# Patient Record
Sex: Female | Born: 1955 | Race: Black or African American | Hispanic: No | Smoking: Current some day smoker
Health system: Southern US, Community
[De-identification: ages and names within clinical notes are randomized; demographics above are authoritative.]

## PROBLEM LIST (undated history)

## (undated) DIAGNOSIS — E876 Hypokalemia: Secondary | ICD-10-CM

## (undated) DIAGNOSIS — D693 Immune thrombocytopenic purpura: Secondary | ICD-10-CM

## (undated) DIAGNOSIS — C801 Malignant (primary) neoplasm, unspecified: Secondary | ICD-10-CM

## (undated) DIAGNOSIS — F419 Anxiety disorder, unspecified: Secondary | ICD-10-CM

## (undated) DIAGNOSIS — J320 Chronic maxillary sinusitis: Secondary | ICD-10-CM

## (undated) DIAGNOSIS — I10 Essential (primary) hypertension: Secondary | ICD-10-CM

## (undated) DIAGNOSIS — M4722 Other spondylosis with radiculopathy, cervical region: Secondary | ICD-10-CM

## (undated) HISTORY — PX: ABDOMINAL HYSTERECTOMY: SHX81

## (undated) HISTORY — PX: BREAST BIOPSY: SHX20

## (undated) HISTORY — PX: OTHER SURGICAL HISTORY: SHX169

---

## 2004-12-19 ENCOUNTER — Other Ambulatory Visit: Payer: Self-pay

## 2004-12-19 ENCOUNTER — Emergency Department: Payer: Self-pay | Admitting: Internal Medicine

## 2005-01-23 ENCOUNTER — Ambulatory Visit: Payer: Self-pay | Admitting: Unknown Physician Specialty

## 2005-01-26 ENCOUNTER — Ambulatory Visit: Payer: Self-pay | Admitting: Internal Medicine

## 2005-02-17 ENCOUNTER — Emergency Department: Payer: Self-pay | Admitting: Emergency Medicine

## 2005-04-14 ENCOUNTER — Ambulatory Visit: Payer: Self-pay | Admitting: Internal Medicine

## 2006-03-09 ENCOUNTER — Emergency Department: Payer: Self-pay | Admitting: Emergency Medicine

## 2006-03-13 ENCOUNTER — Emergency Department: Payer: Self-pay | Admitting: Emergency Medicine

## 2006-04-27 ENCOUNTER — Emergency Department: Payer: Self-pay

## 2006-07-18 ENCOUNTER — Ambulatory Visit: Payer: Self-pay | Admitting: Internal Medicine

## 2007-11-04 ENCOUNTER — Ambulatory Visit: Payer: Self-pay | Admitting: Psychiatry

## 2007-11-04 ENCOUNTER — Inpatient Hospital Stay (HOSPITAL_COMMUNITY): Admission: RE | Admit: 2007-11-04 | Discharge: 2007-11-06 | Payer: Self-pay | Admitting: Psychiatry

## 2007-11-19 ENCOUNTER — Ambulatory Visit: Payer: Self-pay | Admitting: Unknown Physician Specialty

## 2007-11-23 ENCOUNTER — Ambulatory Visit: Payer: Self-pay | Admitting: Unknown Physician Specialty

## 2007-12-24 ENCOUNTER — Ambulatory Visit: Payer: Self-pay | Admitting: Unknown Physician Specialty

## 2009-10-25 ENCOUNTER — Inpatient Hospital Stay: Payer: Self-pay | Admitting: Unknown Physician Specialty

## 2010-02-28 ENCOUNTER — Emergency Department: Payer: Self-pay | Admitting: Emergency Medicine

## 2011-01-19 LAB — TSH: TSH: 0.658

## 2011-01-19 LAB — CBC
MCHC: 33.4
RDW: 15.9 — ABNORMAL HIGH

## 2011-01-19 LAB — COMPREHENSIVE METABOLIC PANEL
ALT: 21
AST: 19
Albumin: 3.4 — ABNORMAL LOW
Calcium: 9.1
GFR calc Af Amer: >60
Sodium: 136
Total Protein: 6.7

## 2011-03-11 ENCOUNTER — Emergency Department: Payer: Self-pay | Admitting: Emergency Medicine

## 2012-12-02 ENCOUNTER — Emergency Department: Payer: Self-pay | Admitting: Emergency Medicine

## 2012-12-04 ENCOUNTER — Emergency Department: Payer: Self-pay | Admitting: Emergency Medicine

## 2013-01-05 ENCOUNTER — Emergency Department: Payer: Self-pay | Admitting: Emergency Medicine

## 2013-07-14 ENCOUNTER — Emergency Department: Payer: Self-pay | Admitting: Emergency Medicine

## 2013-10-22 ENCOUNTER — Emergency Department: Payer: Self-pay | Admitting: Emergency Medicine

## 2013-10-30 ENCOUNTER — Emergency Department: Payer: Self-pay | Admitting: Emergency Medicine

## 2014-05-19 ENCOUNTER — Ambulatory Visit: Payer: Self-pay | Admitting: Family Medicine

## 2014-05-19 DIAGNOSIS — M25532 Pain in left wrist: Secondary | ICD-10-CM | POA: Diagnosis not present

## 2014-05-19 DIAGNOSIS — F418 Other specified anxiety disorders: Secondary | ICD-10-CM | POA: Diagnosis not present

## 2014-05-19 DIAGNOSIS — M1990 Unspecified osteoarthritis, unspecified site: Secondary | ICD-10-CM | POA: Diagnosis not present

## 2014-05-19 DIAGNOSIS — M79642 Pain in left hand: Secondary | ICD-10-CM | POA: Diagnosis not present

## 2014-05-19 DIAGNOSIS — Z8639 Personal history of other endocrine, nutritional and metabolic disease: Secondary | ICD-10-CM | POA: Diagnosis not present

## 2014-05-19 DIAGNOSIS — M19032 Primary osteoarthritis, left wrist: Secondary | ICD-10-CM | POA: Diagnosis not present

## 2014-05-19 DIAGNOSIS — M797 Fibromyalgia: Secondary | ICD-10-CM | POA: Diagnosis not present

## 2014-05-19 DIAGNOSIS — I10 Essential (primary) hypertension: Secondary | ICD-10-CM | POA: Diagnosis not present

## 2014-06-02 ENCOUNTER — Emergency Department: Payer: Self-pay | Admitting: Emergency Medicine

## 2014-06-02 DIAGNOSIS — R197 Diarrhea, unspecified: Secondary | ICD-10-CM | POA: Diagnosis not present

## 2014-06-02 DIAGNOSIS — F419 Anxiety disorder, unspecified: Secondary | ICD-10-CM | POA: Diagnosis not present

## 2014-06-02 DIAGNOSIS — R109 Unspecified abdominal pain: Secondary | ICD-10-CM | POA: Diagnosis not present

## 2014-06-02 DIAGNOSIS — J069 Acute upper respiratory infection, unspecified: Secondary | ICD-10-CM | POA: Diagnosis not present

## 2014-06-02 DIAGNOSIS — R51 Headache: Secondary | ICD-10-CM | POA: Diagnosis not present

## 2014-06-02 DIAGNOSIS — I10 Essential (primary) hypertension: Secondary | ICD-10-CM | POA: Diagnosis not present

## 2014-06-02 DIAGNOSIS — M79645 Pain in left finger(s): Secondary | ICD-10-CM | POA: Diagnosis not present

## 2014-06-02 DIAGNOSIS — R112 Nausea with vomiting, unspecified: Secondary | ICD-10-CM | POA: Diagnosis not present

## 2014-06-02 DIAGNOSIS — Z720 Tobacco use: Secondary | ICD-10-CM | POA: Diagnosis not present

## 2014-07-24 ENCOUNTER — Emergency Department
Admit: 2014-07-24 | Disposition: A | Discharge status: Home / Self Care | Payer: Self-pay | Admitting: Emergency Medicine

## 2014-07-24 DIAGNOSIS — S63502A Unspecified sprain of left wrist, initial encounter: Secondary | ICD-10-CM | POA: Diagnosis not present

## 2014-07-24 DIAGNOSIS — M25532 Pain in left wrist: Secondary | ICD-10-CM | POA: Diagnosis not present

## 2014-07-24 DIAGNOSIS — Z791 Long term (current) use of non-steroidal anti-inflammatories (NSAID): Secondary | ICD-10-CM | POA: Diagnosis not present

## 2014-07-24 DIAGNOSIS — S6992XD Unspecified injury of left wrist, hand and finger(s), subsequent encounter: Secondary | ICD-10-CM | POA: Diagnosis not present

## 2014-07-24 DIAGNOSIS — I10 Essential (primary) hypertension: Secondary | ICD-10-CM | POA: Diagnosis not present

## 2014-07-24 DIAGNOSIS — M1812 Unilateral primary osteoarthritis of first carpometacarpal joint, left hand: Secondary | ICD-10-CM | POA: Diagnosis not present

## 2014-07-24 DIAGNOSIS — Z880 Allergy status to penicillin: Secondary | ICD-10-CM | POA: Diagnosis not present

## 2014-09-14 DIAGNOSIS — J0100 Acute maxillary sinusitis, unspecified: Secondary | ICD-10-CM | POA: Diagnosis not present

## 2014-09-14 DIAGNOSIS — S46811A Strain of other muscles, fascia and tendons at shoulder and upper arm level, right arm, initial encounter: Secondary | ICD-10-CM | POA: Diagnosis not present

## 2014-09-18 ENCOUNTER — Emergency Department
Admission: EM | Admit: 2014-09-18 | Discharge: 2014-09-18 | Disposition: A | Discharge status: Home / Self Care | Payer: Self-pay | Attending: Student | Admitting: Student

## 2014-09-18 ENCOUNTER — Emergency Department: Payer: Self-pay

## 2014-09-18 DIAGNOSIS — Z7952 Long term (current) use of systemic steroids: Secondary | ICD-10-CM | POA: Insufficient documentation

## 2014-09-18 DIAGNOSIS — Z720 Tobacco use: Secondary | ICD-10-CM | POA: Insufficient documentation

## 2014-09-18 DIAGNOSIS — M25511 Pain in right shoulder: Secondary | ICD-10-CM | POA: Insufficient documentation

## 2014-09-18 DIAGNOSIS — M542 Cervicalgia: Secondary | ICD-10-CM | POA: Insufficient documentation

## 2014-09-18 DIAGNOSIS — I10 Essential (primary) hypertension: Secondary | ICD-10-CM | POA: Insufficient documentation

## 2014-09-18 DIAGNOSIS — Z79899 Other long term (current) drug therapy: Secondary | ICD-10-CM | POA: Insufficient documentation

## 2014-09-18 HISTORY — DX: Essential (primary) hypertension: I10

## 2014-09-18 MED ORDER — KETOROLAC TROMETHAMINE 60 MG/2ML IM SOLN
60.0000 mg | Freq: Once | INTRAMUSCULAR | Status: AC
  Administered 2014-09-18: 60 mg via INTRAMUSCULAR

## 2014-09-18 MED ORDER — KETOROLAC TROMETHAMINE 60 MG/2ML IM SOLN
INTRAMUSCULAR | Status: AC
  Filled 2014-09-18: qty 2

## 2014-09-18 MED ORDER — DIAZEPAM 5 MG PO TABS
5.0000 mg | ORAL_TABLET | Freq: Once | ORAL | Status: AC
  Administered 2014-09-18: 5 mg via ORAL

## 2014-09-18 MED ORDER — DIAZEPAM 5 MG PO TABS
ORAL_TABLET | ORAL | Status: AC
  Administered 2014-09-18: 5 mg via ORAL
  Filled 2014-09-18: qty 1

## 2014-09-18 MED ORDER — HYDROCODONE-ACETAMINOPHEN 5-325 MG PO TABS: 1.0000 | ORAL_TABLET | ORAL | Status: DC | PRN

## 2014-09-18 MED ORDER — DIAZEPAM 2 MG PO TABS: 2.0000 mg | ORAL_TABLET | Freq: Three times a day (TID) | ORAL | Status: DC | PRN

## 2014-09-18 MED ORDER — PREDNISONE 10 MG PO TABS: ORAL_TABLET | ORAL | Status: DC

## 2014-09-18 NOTE — ED Provider Notes (Signed)
[facility] Emergency Department Provider Note  ____________________________________________  Time seen:12:20  I have reviewed the triage vital signs and the nursing notes.   HISTORY  Chief Complaint Shoulder Pain    HPI Erica Bartlett is a 59 y.o. female complains of right shoulder pain 1 week. She denies any recent injury to her shoulder. She infrequently has taken Aleve and some Tylenol without relief. She describes her pain as a throbbing sensation in her shoulder radiating down her right upper arm. She has had this same pain in the past but has never seen her primary care doctor for this. She denies any chest pain, shortness of breath, or diaphoresis. There is been no indigestion associated with this. Range of motion increases her pain we're holding R arm decreases the pain. Currently her pain is 10 out of 10.   Past Medical History  Diagnosis Date  . Hypertension     There are no active problems to display for this patient.   Past Surgical History  Procedure Laterality Date  . Cesarean section    . Cyst removed      breast, benign  . Abdominal hysterectomy      Current Outpatient Rx  Name  Route  Sig  Dispense  Refill  . amLODipine (NORVASC) 10 MG tablet   Oral   Take 10 mg by mouth daily.         . hydrochlorothiazide (HYDRODIURIL) 25 MG tablet   Oral   Take 25 mg by mouth daily.         . diazepam (VALIUM) 2 MG tablet   Oral   Take 1 tablet (2 mg total) by mouth every 8 (eight) hours as needed for muscle spasms.   9 tablet   0   . HYDROcodone-acetaminophen (NORCO/VICODIN) 5-325 MG per tablet   Oral   Take 1 tablet by mouth every 4 (four) hours as needed for moderate pain.   20 tablet   0   . predniSONE (DELTASONE) 10 MG tablet      Take 6 tablets  today, on day 2 take 5 tablets, day 3 take 4 tablets, day 4 take 3 tablets, day 5 take  2 tablets and 1 tablet the last day   21 tablet   0     Allergies Review of  patient's allergies indicates no known allergies.  No family history on file.  Social History History  Substance Use Topics  . Smoking status: Current Some Day Smoker  . Smokeless tobacco: Never Used  . Alcohol Use: No    Review of Systems Constitutional: No fever/chills Eyes: No visual changes. ENT: No sore throat. Cardiovascular: Denies chest pain. Respiratory: Denies shortness of breath. Gastrointestinal: No abdominal pain.  No nausea, no vomiting.  Genitourinary: Negative for dysuria. Musculoskeletal: Negative for back pain. Skin: Negative for rash. Neurological: Negative for headaches, focal weakness or numbness.  10-point ROS otherwise negative.  ____________________________________________   PHYSICAL EXAM:  VITAL SIGNS: ED Triage Vitals  Enc Vitals Group     BP 09/18/14 1143 156/104 mmHg     Pulse Rate 09/18/14 1143 68     Resp 09/18/14 1143 18     Temp 09/18/14 1143 97.8 F (36.6 C)     Temp Source 09/18/14 1143 Oral     SpO2 09/18/14 1143 100 %     Weight 09/18/14 1143 169 lb (76.658 kg)     Height 09/18/14 1143 5\' 3"  (1.6 m)     Head Cir --  Peak Flow --      Pain Score 09/18/14 1144 10     Pain Loc --      Pain Edu? --      Excl. in [place]? --     Constitutional: Alert and oriented. Well appearing and in no acute distress. Eyes: Conjunctivae are normal. PERRL. EOMI. Head: Atraumatic. Nose: No congestion/rhinnorhea. Mouth/Throat: Mucous membranes are moist.  Oropharynx non-erythematous. Neck: No stridor.  Neck is supple. There is tenderness on palpation posterior cervical spine to extremely light touch. Tender right trapezius muscle tenderness and upper rhomboid muscle. Hematological/Lymphatic/Immunilogical: No cervical lymphadenopathy. Cardiovascular: Normal rate, regular rhythm. Grossly normal heart sounds.  Good peripheral circulation. Respiratory: Normal respiratory effort.  No retractions. Lungs CTAB. Musculoskeletal: No lower extremity  tenderness nor edema.  No joint effusions. Cervical spine and right shoulder no gross deformity noted. Range of motion is restricted secondary to pain. Patient pulls away during exam with palpation of extremely light touch. There is no crepitus on range of motion of the shoulder. Neurologic:  Normal speech and language. No gross focal neurologic deficits are appreciated. Speech is normal. No gait instability. Grip strength is equal bilaterally Skin:  Skin is warm, dry and intact. No rash noted. Psychiatric: Mood and affect are normal. Speech and behavior are normal.  ____________________________________________   LABS (all labs ordered are listed, but only abnormal results are displayed)  Labs Reviewed - No data to display RADIOLOGY  Cervical spine x-rays per radiologist as no fracture or acute changes. Stable multifocal osteoarthritic. Suspicious for muscle spasms. X-rays right shoulder shows mild degenerative changes with mild spurring of the greater tuberosity ____________________________________________   PROCEDURES  Procedure(s) performed: None  Critical Care performed: No  ____________________________________________   INITIAL IMPRESSION / ASSESSMENT AND PLAN / ED COURSE  Pertinent labs & imaging results that were available during my care of the patient were reviewed by me and considered in my medical decision making (see chart for details).  Patient was placed in a sling for several days for support. She was started on a stairway dosepak along with something for pain. She is to follow-up with Dr. Sabra Heck if any continued problems. ____________________________________________   FINAL CLINICAL IMPRESSION(S) / ED DIAGNOSES  Final diagnoses:  Acute shoulder pain, right  Cervical pain      Johnn Hai, PA-C 09/18/14 1516  Joanne Gavel, MD 09/18/14 [phone]

## 2014-09-18 NOTE — ED Notes (Signed)
Pt c/o pain in neck and right shoulder for the past week..denies injury.Erica Bartlett

## 2014-09-18 NOTE — Discharge Instructions (Signed)
HAVE BLOOD PRESSURE RECHECK BY DR. Luan Pulling NEXT WEEK FOLLOW UP WITH DR. MILLER ABOUT YOUR SHOULDER IF ANY CONTINUED PROBLEMS  YOU MAY USE ICE OR HEAT TO SHOULDER AND NECK AREAS

## 2014-10-08 ENCOUNTER — Telehealth: Payer: Self-pay | Admitting: Family Medicine

## 2014-10-08 DIAGNOSIS — M25511 Pain in right shoulder: Secondary | ICD-10-CM

## 2014-10-08 NOTE — Telephone Encounter (Signed)
Pt states she wants her referral sent to Dr [facility] [phone]

## 2014-10-12 ENCOUNTER — Telehealth: Payer: Self-pay | Admitting: Family Medicine

## 2014-10-12 NOTE — Telephone Encounter (Signed)
Pt called to check the status of referral to Dr. Rudene Christians.  Please call

## 2014-10-14 ENCOUNTER — Encounter: Payer: Self-pay | Admitting: Family Medicine

## 2014-10-14 ENCOUNTER — Ambulatory Visit (INDEPENDENT_AMBULATORY_CARE_PROVIDER_SITE_OTHER): Payer: Commercial Managed Care - HMO | Admitting: Family Medicine

## 2014-10-14 VITALS — BP 127/79 | HR 76 | Resp 16 | Ht 63.0 in | Wt 177.0 lb

## 2014-10-14 DIAGNOSIS — I10 Essential (primary) hypertension: Secondary | ICD-10-CM | POA: Diagnosis not present

## 2014-10-14 DIAGNOSIS — G8929 Other chronic pain: Secondary | ICD-10-CM

## 2014-10-14 DIAGNOSIS — M79601 Pain in right arm: Secondary | ICD-10-CM

## 2014-10-14 DIAGNOSIS — M79603 Pain in arm, unspecified: Secondary | ICD-10-CM

## 2014-10-14 MED ORDER — CYCLOBENZAPRINE HCL 10 MG PO TABS: 10.0000 mg | ORAL_TABLET | Freq: Three times a day (TID) | ORAL | Status: DC | PRN

## 2014-10-14 MED ORDER — AMLODIPINE BESYLATE 10 MG PO TABS: 10.0000 mg | ORAL_TABLET | Freq: Every day | ORAL | Status: DC

## 2014-10-14 MED ORDER — NABUMETONE 500 MG PO TABS: 500.0000 mg | ORAL_TABLET | Freq: Two times a day (BID) | ORAL | Status: DC

## 2014-10-14 NOTE — Patient Instructions (Signed)
Use sling on R. Arm if not supported. Keep appt. With Dr. Leslye Peer on October 28, 2014.

## 2014-10-14 NOTE — Progress Notes (Signed)
Name: Erica Bartlett   MRN: [id number]    DOB: 03-Jan-1956   Date:10/14/2014       Progress Note  Subjective  Chief Complaint  Chief Complaint  Patient presents with  . Arm Pain    Right Side     HPI  C/o R arm and shoulder pain for 2 months.  No injury remembered.  Pain getting worse.  She over exaggerates she pain.  She has an appt. With ortho (Dr. Leslye Peer) at [facility] on July 6.  Past Medical History  Diagnosis Date  . Hypertension     History  Substance Use Topics  . Smoking status: Current Some Day Smoker  . Smokeless tobacco: Never Used  . Alcohol Use: No     Current outpatient prescriptions:  .  amLODipine (NORVASC) 10 MG tablet, Take 10 mg by mouth daily., Disp: , Rfl:  .  clonazePAM (KLONOPIN) 1 MG tablet, TAKE 1 TO 2 TABLETS BY MOUTH 3 TIMES A DAY, Disp: , Rfl: 5 .  diazepam (VALIUM) 2 MG tablet, Take 1 tablet (2 mg total) by mouth every 8 (eight) hours as needed for muscle spasms., Disp: 9 tablet, Rfl: 0 .  fluticasone (FLONASE) 50 MCG/ACT nasal spray, SPRAY TWICE IN EACH NOSTRIL AT BEDTIME, Disp: , Rfl: 10 .  hydrochlorothiazide (HYDRODIURIL) 25 MG tablet, Take 25 mg by mouth daily., Disp: , Rfl:  .  LYRICA 50 MG capsule, Take 50 mg by mouth 2 (two) times daily., Disp: , Rfl: 0  No Known Allergies  Review of Systems  Constitutional: Negative.  Negative for fever and chills.  HENT: Negative.  Negative for congestion and nosebleeds.   Eyes: Negative.  Negative for blurred vision and double vision.  Respiratory: Negative.  Negative for cough, sputum production, shortness of breath and wheezing.   Cardiovascular: Negative.  Negative for chest pain, palpitations, orthopnea, claudication and leg swelling.  Gastrointestinal: Negative.  Negative for heartburn, abdominal pain, diarrhea and blood in stool.  Genitourinary: Negative.  Negative for dysuria, urgency and frequency.  Musculoskeletal: Positive for joint pain. Back pain: R. shoulder and arm.  Skin:  Negative.  Negative for rash.  Neurological: Positive for tingling (R. arm and fingers occ.). Negative for weakness and headaches.      Objective  Filed Vitals:   10/14/14 1535  BP: 127/79  Pulse: 76  Resp: 16  Height: 5\' 3"  (1.6 m)  Weight: 177 lb (80.287 kg)     Physical Exam  Constitutional: She appears distressed.  HENT:  Head: Normocephalic and atraumatic.  Neck: Normal range of motion. Neck supple. No thyromegaly present.  Cardiovascular: Normal rate, regular rhythm, normal heart sounds and intact distal pulses.  Exam reveals no gallop and no friction rub.   No murmur heard. Pulmonary/Chest: Effort normal and breath sounds normal. No respiratory distress. She exhibits no tenderness.  Musculoskeletal:  Pain in neck, R neck and shoulder traps an along entire R shoulder joint line and into R. Deltoid and tricep and bicep.  Pain out of proportion to palpation or movement.  Difficult to tell about neuro deficits because of exaggerated movements and expression.  Lymphadenopathy:    She has no cervical adenopathy.  Vitals reviewed.       Assessment & Plan     1. Chronic pain of right upper extremity  - nabumetone (RELAFEN) 500 MG tablet; Take 1 tablet (500 mg total) by mouth 2 (two) times daily.  Dispense: 60 tablet; Refill: 0 - cyclobenzaprine (FLEXERIL) 10 MG tablet;  Take 1 tablet (10 mg total) by mouth 3 (three) times daily as needed for muscle spasms.  Dispense: 30 tablet; Refill: 1  2. Essential hypertension  - amLODipine (NORVASC) 10 MG tablet; Take 1 tablet (10 mg total) by mouth daily.  Dispense: 30 tablet; Refill: 6

## 2014-10-16 ENCOUNTER — Telehealth: Payer: Self-pay | Admitting: Family Medicine

## 2014-10-16 NOTE — Telephone Encounter (Signed)
Trying to call pt no VM set up office visit recommenced to diagnose UTI and treat.

## 2014-10-16 NOTE — Telephone Encounter (Signed)
Pt states she is experiencing UTI symptoms and would like medications called into the [facility] at [facility].

## 2014-10-28 DIAGNOSIS — M542 Cervicalgia: Secondary | ICD-10-CM | POA: Diagnosis not present

## 2014-10-28 DIAGNOSIS — M4722 Other spondylosis with radiculopathy, cervical region: Secondary | ICD-10-CM | POA: Diagnosis not present

## 2014-10-28 DIAGNOSIS — M25511 Pain in right shoulder: Secondary | ICD-10-CM | POA: Diagnosis not present

## 2014-10-28 DIAGNOSIS — M7581 Other shoulder lesions, right shoulder: Secondary | ICD-10-CM | POA: Diagnosis not present

## 2015-02-17 ENCOUNTER — Ambulatory Visit: Payer: Commercial Managed Care - HMO | Admitting: Family Medicine

## 2015-02-18 ENCOUNTER — Ambulatory Visit (INDEPENDENT_AMBULATORY_CARE_PROVIDER_SITE_OTHER): Payer: Commercial Managed Care - HMO | Admitting: Family Medicine

## 2015-02-18 ENCOUNTER — Encounter: Payer: Self-pay | Admitting: Family Medicine

## 2015-02-18 VITALS — BP 136/87 | HR 82 | Temp 98.2°F | Resp 16 | Ht 63.0 in | Wt 177.6 lb

## 2015-02-18 DIAGNOSIS — Z23 Encounter for immunization: Secondary | ICD-10-CM

## 2015-02-18 DIAGNOSIS — J309 Allergic rhinitis, unspecified: Secondary | ICD-10-CM | POA: Diagnosis not present

## 2015-02-18 DIAGNOSIS — J0111 Acute recurrent frontal sinusitis: Secondary | ICD-10-CM

## 2015-02-18 DIAGNOSIS — Z720 Tobacco use: Secondary | ICD-10-CM | POA: Diagnosis not present

## 2015-02-18 DIAGNOSIS — F17200 Nicotine dependence, unspecified, uncomplicated: Secondary | ICD-10-CM

## 2015-02-18 MED ORDER — SULFAMETHOXAZOLE-TRIMETHOPRIM 800-160 MG PO TABS: 1.0000 | ORAL_TABLET | Freq: Two times a day (BID) | ORAL | Status: DC

## 2015-02-18 MED ORDER — FLUTICASONE PROPIONATE 50 MCG/ACT NA SUSP: 2.0000 | Freq: Every day | NASAL | Status: DC

## 2015-02-18 NOTE — Progress Notes (Signed)
Date:  02/18/2015   Name:  Erica Bartlett   DOB:  1955-11-24   MRN:  [id number]  PCP:  Dicky Doe, MD    Chief Complaint: Sinusitis   History of Present Illness:  This is a 59 y.o. female with 3d hx frontal headache getting worse associated with postnasal drip, rhinorrhea, and cough productive of yellow phlegm. Reports chills overnight and mild B otalgia, Tylenol helped some, says decongestants raise her blood pressure. Hx recurrent sinus problems, saw ENT and had polypectomy 10 yrs ago. Records show allergy to PCN with hives but pt says has tolerated PCN since. Requests flu shot.  Review of Systems:  Review of Systems  Constitutional: Negative for fever.  HENT: Negative for facial swelling, sore throat and trouble swallowing.   Respiratory: Negative for shortness of breath.   Cardiovascular: Negative for chest pain and leg swelling.    Patient Active Problem List   Diagnosis Date Noted  . Allergic rhinitis 02/18/2015  . Other spondylosis with radiculopathy, cervical region 10/28/2014  . Other shoulder lesions, right shoulder 10/28/2014  . Arm pain, chronic 10/14/2014  . Hypertension 10/14/2014    Prior to Admission medications   Medication Sig Start Date End Date Taking? Authorizing Provider  amLODipine (NORVASC) 10 MG tablet Take 1 tablet (10 mg total) by mouth daily. 10/14/14  Yes Arlis Porta., MD  clonazePAM (KLONOPIN) 1 MG tablet TAKE 1 TO 2 TABLETS BY MOUTH 3 TIMES A DAY 07/12/14  Yes Historical Provider, MD  cyclobenzaprine (FLEXERIL) 10 MG tablet Take 1 tablet (10 mg total) by mouth 3 (three) times daily as needed for muscle spasms. 10/14/14  Yes Arlis Porta., MD  diazepam (VALIUM) 2 MG tablet Take 1 tablet (2 mg total) by mouth every 8 (eight) hours as needed for muscle spasms. 09/18/14  Yes Johnn Hai, PA-C  fluticasone (FLONASE) 50 MCG/ACT nasal spray Place 2 sprays into both nostrils daily. 02/18/15  Yes Adline Potter, MD   hydrochlorothiazide (HYDRODIURIL) 25 MG tablet Take 25 mg by mouth daily.   Yes Historical Provider, MD  LYRICA 50 MG capsule Take 50 mg by mouth 3 (three) times daily. 09/25/14  Yes Historical Provider, MD  nabumetone (RELAFEN) 500 MG tablet Take 1 tablet (500 mg total) by mouth 2 (two) times daily. 10/14/14  Yes Arlis Porta., MD  sulfamethoxazole-trimethoprim (BACTRIM DS,SEPTRA DS) 800-160 MG tablet Take 1 tablet by mouth 2 (two) times daily. 02/18/15   Adline Potter, MD    No Known Allergies  Past Surgical History  Procedure Laterality Date  . Cesarean section    . Cyst removed      breast, benign  . Abdominal hysterectomy      Social History  Substance Use Topics  . Smoking status: Current Some Day Smoker  . Smokeless tobacco: Never Used  . Alcohol Use: No    No family history on file.  Medication list has been reviewed and updated.  Physical Examination: BP 136/87 mmHg  Pulse 82  Temp(Src) 98.2 F (36.8 C) (Oral)  Resp 16  Ht 5\' 3"  (1.6 m)  Wt 177 lb 9.6 oz (80.559 kg)  BMI 31.47 kg/m2  Physical Exam  Constitutional: She appears well-developed and well-nourished.  HENT:  Right Ear: External ear normal.  Left Ear: External ear normal.  Mouth/Throat: Oropharynx is clear and moist.  TM's clear Moderate B frontal and max sinus tenderness  Pulmonary/Chest: Effort normal and breath sounds normal.  Neurological: She is alert.  Skin: Skin is warm and dry.  Psychiatric: She has a normal mood and affect. Her behavior is normal.    Assessment and Plan:  1. Acute recurrent frontal sinusitis Hx sinus polypectomy 10 yrs ago - sulfamethoxazole-trimethoprim (BACTRIM DS,SEPTRA DS) 800-160 MG tablet; Take 1 tablet by mouth 2 (two) times daily.  Dispense: 20 tablet; Refill: 0 - Ambulatory referral to ENT  2. Allergic rhinitis, unspecified allergic rhinitis type Well controlled, requests Flonase refill - fluticasone (FLONASE) 50 MCG/ACT nasal spray; Place 2 sprays  into both nostrils daily.  Dispense: 16 g; Refill: 3  3. Need for influenza vaccination - Flu Vaccine QUAD 36+ mos PF IM (Fluarix & Fluzone Quad PF)  4. Smoker Advised cessation  Return if symptoms worsen or fail to improve.  Satira Anis. [facility]  02/18/2015

## 2015-02-22 ENCOUNTER — Telehealth: Payer: Self-pay | Admitting: Family Medicine

## 2015-02-22 NOTE — Telephone Encounter (Signed)
Work note for 10/29 and 10/30 ok

## 2015-02-22 NOTE — Telephone Encounter (Signed)
Pt was in last week for possible sinus infection and saw Dr. Vicente Masson and is taking antibiotics.  She still feels bad and is running a fever.  She asked for a note for missing work on Saturday and Sunday.  Her call back number is [phone]

## 2015-02-22 NOTE — Telephone Encounter (Signed)
Patient was given a work note from for 10/27 and 10/28. Patient missed work 10/29 and 10/30 she is requesting another note.

## 2015-02-23 ENCOUNTER — Encounter: Payer: Self-pay | Admitting: *Deleted

## 2015-02-23 NOTE — Telephone Encounter (Signed)
Done

## 2015-03-23 ENCOUNTER — Ambulatory Visit (INDEPENDENT_AMBULATORY_CARE_PROVIDER_SITE_OTHER): Payer: Commercial Managed Care - HMO | Admitting: Family Medicine

## 2015-03-23 ENCOUNTER — Encounter: Payer: Self-pay | Admitting: Family Medicine

## 2015-03-23 VITALS — BP 125/77 | HR 65 | Temp 98.3°F | Resp 16 | Ht 63.5 in | Wt 176.0 lb

## 2015-03-23 DIAGNOSIS — J0101 Acute recurrent maxillary sinusitis: Secondary | ICD-10-CM

## 2015-03-23 DIAGNOSIS — J069 Acute upper respiratory infection, unspecified: Secondary | ICD-10-CM | POA: Diagnosis not present

## 2015-03-23 DIAGNOSIS — Z720 Tobacco use: Secondary | ICD-10-CM | POA: Diagnosis not present

## 2015-03-23 DIAGNOSIS — F17200 Nicotine dependence, unspecified, uncomplicated: Secondary | ICD-10-CM

## 2015-03-23 MED ORDER — PSEUDOEPHEDRINE HCL 60 MG PO TABS: 60.0000 mg | ORAL_TABLET | Freq: Four times a day (QID) | ORAL | Status: DC | PRN

## 2015-03-23 MED ORDER — BENZONATATE 100 MG PO CAPS: 100.0000 mg | ORAL_CAPSULE | Freq: Three times a day (TID) | ORAL | Status: DC

## 2015-03-23 MED ORDER — AMOXICILLIN-POT CLAVULANATE 875-125 MG PO TABS: 1.0000 | ORAL_TABLET | Freq: Two times a day (BID) | ORAL | Status: DC

## 2015-03-23 MED ORDER — ALBUTEROL SULFATE HFA 108 (90 BASE) MCG/ACT IN AERS: 2.0000 | INHALATION_SPRAY | Freq: Four times a day (QID) | RESPIRATORY_TRACT | Status: DC | PRN

## 2015-03-23 MED ORDER — GUAIFENESIN-CODEINE 100-10 MG/5ML PO SOLN: 5.0000 mL | Freq: Every day | ORAL | Status: DC

## 2015-03-23 NOTE — Assessment & Plan Note (Signed)
Pt encouraged to quit smoking  

## 2015-03-23 NOTE — Progress Notes (Signed)
Subjective:    Patient ID: Erica Bartlett, female    DOB: Jun 22, 1955, 59 y.o.   MRN: [id number]  HPI: Erica Bartlett is a 59 y.o. female presenting on 03/23/2015 for Cough   Sinusitis This is a recurrent problem. The maximum temperature recorded prior to her arrival was 100.4 - 100.9 F. The fever has been present for 1 to 2 days. Her pain is at a severity of 5/10. The pain is moderate. Associated symptoms include chills, congestion, coughing, ear pain, headaches, neck pain and sinus pressure. Pertinent negatives include no shortness of breath, sneezing, sore throat or swollen glands. Past treatments include oral decongestants and antibiotics.    Pt presents for congestion- symptoms began about 2-3 weeks ago.  Purulent drainage from nose and productive cough with yellow sputum. Low grade fevers at home- about 100. Mild chest tightness- no shortness of breath. Hurts to cough. Facial pain, pressure, and headaches.   Past Medical History  Diagnosis Date  . Hypertension     Current Outpatient Prescriptions on File Prior to Visit  Medication Sig  . amLODipine (NORVASC) 10 MG tablet Take 1 tablet (10 mg total) by mouth daily.  . clonazePAM (KLONOPIN) 1 MG tablet TAKE 1 TO 2 TABLETS BY MOUTH 3 TIMES A DAY  . cyclobenzaprine (FLEXERIL) 10 MG tablet Take 1 tablet (10 mg total) by mouth 3 (three) times daily as needed for muscle spasms.  . diazepam (VALIUM) 2 MG tablet Take 1 tablet (2 mg total) by mouth every 8 (eight) hours as needed for muscle spasms.  . fluticasone (FLONASE) 50 MCG/ACT nasal spray Place 2 sprays into both nostrils daily.  . hydrochlorothiazide (HYDRODIURIL) 25 MG tablet Take 25 mg by mouth daily.  Marland Kitchen LYRICA 50 MG capsule Take 50 mg by mouth 3 (three) times daily.   No current facility-administered medications on file prior to visit.    Review of Systems  Constitutional: Positive for chills.  HENT: Positive for congestion, ear pain and sinus pressure. Negative  for sneezing and sore throat.   Respiratory: Positive for cough and chest tightness. Negative for shortness of breath and wheezing.   Cardiovascular: Negative for chest pain and palpitations.  Gastrointestinal: Negative.  Negative for nausea, vomiting and diarrhea.  Musculoskeletal: Positive for neck pain.  Neurological: Positive for headaches.   Per HPI unless specifically indicated above     Objective:    BP 125/77 mmHg  Pulse 65  Temp(Src) 98.3 F (36.8 C)  Resp 16  Ht 5' 3.5" (1.613 m)  Wt 176 lb (79.833 kg)  BMI 30.68 kg/m2  Wt Readings from Last 3 Encounters:  03/23/15 176 lb (79.833 kg)  02/18/15 177 lb 9.6 oz (80.559 kg)  10/14/14 177 lb (80.287 kg)    Physical Exam  Constitutional: She appears well-developed and well-nourished. No distress.  HENT:  Head: Normocephalic.  Right Ear: Hearing and tympanic membrane normal. Tympanic membrane is not erythematous and not bulging.  Left Ear: Hearing normal. Tympanic membrane is bulging. Tympanic membrane is not erythematous.  Nose: Mucosal edema and rhinorrhea present. No sinus tenderness or nasal septal hematoma. Right sinus exhibits maxillary sinus tenderness and frontal sinus tenderness. Left sinus exhibits no maxillary sinus tenderness and no frontal sinus tenderness.  Mouth/Throat: Uvula is midline and mucous membranes are normal. No uvula swelling. Posterior oropharyngeal erythema present. No posterior oropharyngeal edema.  Neck: Neck supple. No Brudzinski's sign and no Kernig's sign noted.  Cardiovascular: Normal rate, regular rhythm and normal heart sounds.  Pulmonary/Chest: Breath sounds normal. No accessory muscle usage. No tachypnea. No respiratory distress.  Lymphadenopathy:    She has no cervical adenopathy.       Assessment & Plan:   Problem List Items Addressed This Visit      Other   Smoker    Pt encouraged to quit smoking.        Other Visit Diagnoses    Acute recurrent maxillary sinusitis    -   Primary    Recurrent sinusitis. Treat with augmentin BID for 10 days. Supportive care at home. Return precautions reviewed.     Relevant Medications    amoxicillin-clavulanate (AUGMENTIN) 875-125 MG tablet    benzonatate (TESSALON) 100 MG capsule    guaiFENesin-codeine 100-10 MG/5ML syrup    pseudoephedrine (SUDAFED) 60 MG tablet    URI, acute        Supportive care.     Relevant Medications    benzonatate (TESSALON) 100 MG capsule    guaiFENesin-codeine 100-10 MG/5ML syrup    albuterol (PROVENTIL HFA;VENTOLIN HFA) 108 (90 BASE) MCG/ACT inhaler       Meds ordered this encounter  Medications  . acetaminophen (TYLENOL) 500 MG tablet    Sig: Take 500 mg by mouth every 6 (six) hours as needed.  Marland Kitchen amoxicillin-clavulanate (AUGMENTIN) 875-125 MG tablet    Sig: Take 1 tablet by mouth 2 (two) times daily.    Dispense:  20 tablet    Refill:  0    Order Specific Question:  Supervising Provider    Answer:  Arlis Porta [phone]  . benzonatate (TESSALON) 100 MG capsule    Sig: Take 1 capsule (100 mg total) by mouth 3 (three) times daily.    Dispense:  30 capsule    Refill:  0    Order Specific Question:  Supervising Provider    Answer:  Arlis Porta [id number]  . guaiFENesin-codeine 100-10 MG/5ML syrup    Sig: Take 5 mLs by mouth at bedtime.    Dispense:  120 mL    Refill:  0    Order Specific Question:  Supervising Provider    Answer:  Arlis Porta [id number]  . pseudoephedrine (SUDAFED) 60 MG tablet    Sig: Take 1 tablet (60 mg total) by mouth every 6 (six) hours as needed for congestion.    Dispense:  30 tablet    Refill:  0    Order Specific Question:  Supervising Provider    Answer:  Arlis Porta [phone]  . albuterol (PROVENTIL HFA;VENTOLIN HFA) 108 (90 BASE) MCG/ACT inhaler    Sig: Inhale 2 puffs into the lungs every 6 (six) hours as needed for wheezing or shortness of breath.    Dispense:  1 Inhaler    Refill:  3    Order Specific Question:   Supervising Provider    Answer:  Arlis Porta [id number]      Follow up plan: Return if symptoms worsen or fail to improve.

## 2015-03-23 NOTE — Patient Instructions (Signed)
You can use supportive care at home to help with your symptoms. I have sent tesslon perles to your pharmacy to help with the cough- you can take these 3 times daily as needed. Honey is a natural cough suppressant- so add it to your tea in the morning.  If you have a humidifer, set that up in your bedroom at night.  Please seek immediate medical attention if you develop shortness of breath not relieve by inhaler, chest pain/tightness, fever > 103 F or other concerning symptoms.

## 2015-05-19 ENCOUNTER — Ambulatory Visit (INDEPENDENT_AMBULATORY_CARE_PROVIDER_SITE_OTHER): Payer: Commercial Managed Care - HMO | Admitting: Family Medicine

## 2015-05-19 ENCOUNTER — Encounter: Payer: Self-pay | Admitting: Family Medicine

## 2015-05-19 VITALS — BP 118/83 | HR 78 | Temp 97.8°F | Resp 16 | Ht 63.0 in | Wt 176.0 lb

## 2015-05-19 DIAGNOSIS — M797 Fibromyalgia: Secondary | ICD-10-CM | POA: Diagnosis not present

## 2015-05-19 DIAGNOSIS — B029 Zoster without complications: Secondary | ICD-10-CM | POA: Diagnosis not present

## 2015-05-19 MED ORDER — VALACYCLOVIR HCL 1 G PO TABS: 1000.0000 mg | ORAL_TABLET | Freq: Three times a day (TID) | ORAL | Status: DC

## 2015-05-19 MED ORDER — TRAMADOL HCL 50 MG PO TABS: 50.0000 mg | ORAL_TABLET | Freq: Four times a day (QID) | ORAL | Status: DC | PRN

## 2015-05-19 NOTE — Progress Notes (Signed)
Date:  05/19/2015   Name:  Erica Bartlett   DOB:  March 08, 1956   MRN:  [id number]  PCP:  Dicky Doe, MD    Chief Complaint: Herpes Zoster   History of Present Illness:  This is a 60 y.o. female who has been caring for her mother with zoster. Reports 3d hx painful burning lesions on L inner thigh (8/10 pain). Denies new sexual contact. Hx chickenpox but not shingles. On multiple meds for fibromyalgia per pt.  Review of Systems:  Review of Systems  Constitutional: Negative for fever.    Patient Active Problem List   Diagnosis Date Noted  . Fibromyalgia 05/19/2015  . Allergic rhinitis 02/18/2015  . Smoker 02/18/2015  . Other spondylosis with radiculopathy, cervical region 10/28/2014  . Other shoulder lesions, right shoulder 10/28/2014  . Arm pain, chronic 10/14/2014  . Hypertension 10/14/2014    Prior to Admission medications   Medication Sig Start Date End Date Taking? Authorizing Provider  acetaminophen (TYLENOL) 500 MG tablet Take 500 mg by mouth every 6 (six) hours as needed.   Yes Historical Provider, MD  albuterol (PROVENTIL HFA;VENTOLIN HFA) 108 (90 BASE) MCG/ACT inhaler Inhale 2 puffs into the lungs every 6 (six) hours as needed for wheezing or shortness of breath. 03/23/15  Yes Amy Overton Mam, NP  amLODipine (NORVASC) 10 MG tablet Take 1 tablet (10 mg total) by mouth daily. 10/14/14  Yes Arlis Porta., MD  benzonatate (TESSALON) 100 MG capsule Take 1 capsule (100 mg total) by mouth 3 (three) times daily. 03/23/15  Yes Amy Overton Mam, NP  clonazePAM (KLONOPIN) 1 MG tablet TAKE 1 TO 2 TABLETS BY MOUTH 3 TIMES A DAY 07/12/14  Yes Historical Provider, MD  cyclobenzaprine (FLEXERIL) 10 MG tablet Take 1 tablet (10 mg total) by mouth 3 (three) times daily as needed for muscle spasms. 10/14/14  Yes Arlis Porta., MD  fluticasone [facility]) 50 MCG/ACT nasal spray Place 2 sprays into both nostrils daily. 02/18/15  Yes Adline Potter, MD  hydrochlorothiazide  (HYDRODIURIL) 25 MG tablet Take 25 mg by mouth daily.   Yes Historical Provider, MD  LYRICA 50 MG capsule Take 50 mg by mouth 3 (three) times daily. 09/25/14  Yes Historical Provider, MD  traMADol (ULTRAM) 50 MG tablet Take 1 tablet (50 mg total) by mouth every 6 (six) hours as needed. 05/19/15   Adline Potter, MD  valACYclovir (VALTREX) 1000 MG tablet Take 1 tablet (1,000 mg total) by mouth 3 (three) times daily. 05/19/15   Adline Potter, MD    No Known Allergies  Past Surgical History  Procedure Laterality Date  . Cesarean section    . Cyst removed      breast, benign  . Abdominal hysterectomy      Social History  Substance Use Topics  . Smoking status: Current Some Day Smoker  . Smokeless tobacco: Never Used  . Alcohol Use: No    History reviewed. No pertinent family history.  Medication list has been reviewed and updated.  Physical Examination: BP 118/83 mmHg  Pulse 78  Temp(Src) 97.8 F (36.6 C)  Resp 16  Ht 5\' 3"  (1.6 m)  Wt 176 lb (79.833 kg)  BMI 31.18 kg/m2  Physical Exam  Constitutional: She appears well-developed and well-nourished.  Neurological: She is alert.  Skin: Skin is warm and dry.  Scattered vesicles in roughly dermatomal distribution over L inner thigh  Psychiatric: She has a normal mood and affect. Her behavior is normal.  Nursing  note and vitals reviewed.   Assessment and Plan:  1. Zoster Valtrex x 7d, tramadol prn pain  2. Fibromyalgia Continue current meds  Return if symptoms worsen or fail to improve.  Satira Anis. [facility]  05/19/2015

## 2015-05-19 NOTE — Patient Instructions (Signed)

## 2015-05-21 ENCOUNTER — Telehealth: Payer: Self-pay

## 2015-05-21 NOTE — Telephone Encounter (Signed)
Valtrex causing nausea (severe). Explained this is viral and if this Rx causing her to feel sick per Plonk she may stop taking it. Patient asked for antibiotic and I went over tat it will not help due to this being viral. Patient understood. She asked about transmitting the shingles and I advised she is ok unless skin contact. Works at [employer] and [employer]. I advised per Plonk for her to remain out of work until Monday to be safe as instructed by [place]. [facility]

## 2015-06-28 ENCOUNTER — Ambulatory Visit (INDEPENDENT_AMBULATORY_CARE_PROVIDER_SITE_OTHER): Payer: Commercial Managed Care - HMO | Admitting: Family Medicine

## 2015-06-28 ENCOUNTER — Encounter: Payer: Self-pay | Admitting: Family Medicine

## 2015-06-28 VITALS — BP 120/70 | HR 74 | Resp 16 | Ht 63.0 in | Wt 177.6 lb

## 2015-06-28 DIAGNOSIS — M79606 Pain in leg, unspecified: Secondary | ICD-10-CM

## 2015-06-28 DIAGNOSIS — R040 Epistaxis: Secondary | ICD-10-CM

## 2015-06-28 DIAGNOSIS — M79601 Pain in right arm: Secondary | ICD-10-CM

## 2015-06-28 DIAGNOSIS — G8929 Other chronic pain: Secondary | ICD-10-CM | POA: Diagnosis not present

## 2015-06-28 DIAGNOSIS — M797 Fibromyalgia: Secondary | ICD-10-CM

## 2015-06-28 DIAGNOSIS — B070 Plantar wart: Secondary | ICD-10-CM

## 2015-06-28 DIAGNOSIS — M79604 Pain in right leg: Secondary | ICD-10-CM

## 2015-06-28 DIAGNOSIS — J302 Other seasonal allergic rhinitis: Secondary | ICD-10-CM | POA: Diagnosis not present

## 2015-06-28 MED ORDER — PREGABALIN 75 MG PO CAPS: 75.0000 mg | ORAL_CAPSULE | Freq: Three times a day (TID) | ORAL | Status: DC

## 2015-06-28 MED ORDER — LORATADINE 10 MG PO TABS: 10.0000 mg | ORAL_TABLET | Freq: Every day | ORAL | Status: DC

## 2015-06-28 NOTE — Patient Instructions (Signed)
Plan referral to [facility] ENT if nosebleeds continue.

## 2015-06-28 NOTE — Progress Notes (Signed)
Name: Erica Bartlett   MRN: [id number]    DOB: 12-26-1955   Date:06/28/2015       Progress Note  Subjective  Chief Complaint  Chief Complaint  Patient presents with  . Leg Pain    Right hip/leg pain. Numbness and pain shoots down leg from hip.   Marland Kitchen Epistaxis    has had random nosebleeds over last month 3-4 .    Leg Pain  Associated symptoms include tingling.  Epistaxis    Here c/o nosebleeds on and off for past last month.  Has bled 3-4 times.  Takes several minutes to make stop.  C/o  R leg pain that feels like pain in feet, and now has pain going from foot to her hip.  Worse with standing a long time.  Feet ache so bad and R hip hurts.  She is up and down all night with feet and hip.  Gets tingly feeling in R leg also  Also c/o bruises on R leg.  No problem-specific assessment & plan notes found for this encounter.   Past Medical History  Diagnosis Date  . Hypertension     Social History  Substance Use Topics  . Smoking status: Current Some Day Smoker  . Smokeless tobacco: Never Used  . Alcohol Use: No     Current outpatient prescriptions:  .  acetaminophen (TYLENOL) 500 MG tablet, Take 500 mg by mouth every 6 (six) hours as needed., Disp: , Rfl:  .  albuterol (PROVENTIL HFA;VENTOLIN HFA) 108 (90 BASE) MCG/ACT inhaler, Inhale 2 puffs into the lungs every 6 (six) hours as needed for wheezing or shortness of breath., Disp: 1 Inhaler, Rfl: 3 .  amLODipine (NORVASC) 10 MG tablet, Take 1 tablet (10 mg total) by mouth daily., Disp: 30 tablet, Rfl: 6 .  clonazePAM (KLONOPIN) 1 MG tablet, TAKE 1 TO 2 TABLETS BY MOUTH 3 TIMES A DAY, Disp: , Rfl: 5 .  cyclobenzaprine (FLEXERIL) 10 MG tablet, Take 1 tablet (10 mg total) by mouth 3 (three) times daily as needed for muscle spasms., Disp: 30 tablet, Rfl: 1 .  fluticasone (FLONASE) 50 MCG/ACT nasal spray, Place 2 sprays into both nostrils daily., Disp: 16 g, Rfl: 3 .  hydrochlorothiazide (HYDRODIURIL) 25 MG tablet, Take 25 mg by  mouth daily., Disp: , Rfl:  .  LYRICA 50 MG capsule, Take 50 mg by mouth 3 (three) times daily., Disp: , Rfl: 0 .  valACYclovir (VALTREX) 1000 MG tablet, Take 1 tablet (1,000 mg total) by mouth 3 (three) times daily., Disp: 21 tablet, Rfl: 0  Not on File  Review of Systems  Constitutional: Negative for fever, chills, weight loss and malaise/fatigue.  HENT: Positive for congestion and nosebleeds. Negative for hearing loss.   Eyes: Negative for blurred vision and double vision.  Respiratory: Negative for cough, shortness of breath and wheezing.   Cardiovascular: Negative for chest pain, palpitations and leg swelling.  Gastrointestinal: Negative for heartburn, abdominal pain and blood in stool.  Genitourinary: Negative for dysuria, urgency and frequency.  Skin: Negative for rash.  Neurological: Positive for tingling and sensory change (R foot and leg). Negative for weakness and headaches. Focal weakness: R foot and leg.      Objective  Filed Vitals:   06/28/15 1609  BP: 120/70  Pulse: 74  Resp: 16  Height: 5\' 3"  (1.6 m)  Weight: 177 lb 9.6 oz (80.559 kg)     Physical Exam  Constitutional: She is well-developed, well-nourished, and in no distress. No  distress.  HENT:  Head: Normocephalic and atraumatic.  Right Ear: External ear normal.  Left Ear: External ear normal.  Nose: Epistaxis (old on bilateral sides of septum) is observed.  Mouth/Throat: Oropharynx is clear and moist.  Musculoskeletal:  Pain up and down R lateral leg with palpation and ROM.    Skin:  Many bruises on bilateral legs from multiple minor trauma.  Numerous plantar warts on R foot that are very painful and 1 on pad of L great toes that is minimally painful      No results found for this or any previous visit (from the past 2160 hour(s)).   Assessment & Plan 1. Fibromyalgia   2. Chronic leg pain, right  - pregabalin (LYRICA) 75 MG capsule; Take 1 capsule (75 mg total) by mouth 3 (three) times  daily.  Dispense: 90 capsule; Refill: 5 Increased from 50 mg tid 3. Plantar warts  - Ambulatory referral to Podiatry  4. Seasonal allergies  - loratadine (CLARITIN) 10 MG tablet; Take 1 tablet (10 mg total) by mouth daily.  Dispense: 30 tablet; Refill: 11  5. Epistaxis, recurrent Stop Flonase

## 2015-07-07 ENCOUNTER — Telehealth: Payer: Self-pay | Admitting: Family Medicine

## 2015-07-07 DIAGNOSIS — A239 Brucellosis, unspecified: Secondary | ICD-10-CM

## 2015-07-07 NOTE — Telephone Encounter (Signed)
Pt will be here to pick up lab slip order by Dr. Luan Pulling.

## 2015-07-15 ENCOUNTER — Telehealth: Payer: Self-pay | Admitting: *Deleted

## 2015-07-15 NOTE — Telephone Encounter (Signed)
Dr. Luan Pulling would like to know if she is going have blood work done?

## 2015-07-26 ENCOUNTER — Encounter: Payer: Self-pay | Admitting: Family Medicine

## 2015-07-26 ENCOUNTER — Ambulatory Visit (INDEPENDENT_AMBULATORY_CARE_PROVIDER_SITE_OTHER): Payer: Commercial Managed Care - HMO | Admitting: Family Medicine

## 2015-07-26 VITALS — BP 163/92 | HR 62 | Temp 98.7°F | Resp 16 | Ht 63.0 in | Wt 179.0 lb

## 2015-07-26 DIAGNOSIS — R233 Spontaneous ecchymoses: Secondary | ICD-10-CM

## 2015-07-26 DIAGNOSIS — J40 Bronchitis, not specified as acute or chronic: Secondary | ICD-10-CM | POA: Diagnosis not present

## 2015-07-26 MED ORDER — AMOXICILLIN-POT CLAVULANATE 875-125 MG PO TABS: 1.0000 | ORAL_TABLET | Freq: Two times a day (BID) | ORAL | Status: DC

## 2015-07-26 NOTE — Progress Notes (Signed)
Name: Erica Bartlett   MRN: [id number]    DOB: 04/20/1956   Date:07/26/2015       Progress Note  Subjective  Chief Complaint  Chief Complaint  Patient presents with  . Sinusitis    onset 4 days got progessively got worst fever 100.9 coughing hard HA congestion brown phlem chiils bodyache L ear sore     HPI She is here c/o 4 days of cough that s productive of thick yellow sputum.  Then developed sinus congestion.  Fever 2 days ago.  None since.  Has chills,body aches, HA, weakness.  These last sx x 24 hrs. No problem-specific assessment & plan notes found for this encounter.   Past Medical History  Diagnosis Date  . Hypertension     Social History  Substance Use Topics  . Smoking status: Current Some Day Smoker  . Smokeless tobacco: Never Used  . Alcohol Use: No     Current outpatient prescriptions:  .  acetaminophen (TYLENOL) 500 MG tablet, Take 500 mg by mouth every 6 (six) hours as needed., Disp: , Rfl:  .  albuterol (PROVENTIL HFA;VENTOLIN HFA) 108 (90 BASE) MCG/ACT inhaler, Inhale 2 puffs into the lungs every 6 (six) hours as needed for wheezing or shortness of breath., Disp: 1 Inhaler, Rfl: 3 .  amLODipine (NORVASC) 10 MG tablet, Take 1 tablet (10 mg total) by mouth daily., Disp: 30 tablet, Rfl: 6 .  clonazePAM (KLONOPIN) 1 MG tablet, TAKE 1 TO 2 TABLETS BY MOUTH 3 TIMES A DAY, Disp: , Rfl: 5 .  cyclobenzaprine (FLEXERIL) 10 MG tablet, Take 1 tablet (10 mg total) by mouth 3 (three) times daily as needed for muscle spasms., Disp: 30 tablet, Rfl: 1 .  hydrochlorothiazide (HYDRODIURIL) 25 MG tablet, Take 25 mg by mouth daily., Disp: , Rfl:  .  loratadine (CLARITIN) 10 MG tablet, Take 1 tablet (10 mg total) by mouth daily., Disp: 30 tablet, Rfl: 11 .  pregabalin (LYRICA) 75 MG capsule, Take 1 capsule (75 mg total) by mouth 3 (three) times daily., Disp: 90 capsule, Rfl: 5 .  valACYclovir (VALTREX) 1000 MG tablet, Take 1 tablet (1,000 mg total) by mouth 3 (three) times  daily., Disp: 21 tablet, Rfl: 0  Not on File  Review of Systems  Constitutional: Positive for fever, chills and malaise/fatigue. Negative for weight loss.  HENT: Positive for congestion and sore throat.   Eyes: Negative for blurred vision and double vision.  Respiratory: Positive for cough and sputum production (yellow). Negative for shortness of breath and wheezing.   Cardiovascular: Negative for chest pain, palpitations and leg swelling.  Gastrointestinal: Negative for heartburn, abdominal pain and blood in stool.  Skin: Negative for rash.       Continued bruising  Neurological: Positive for weakness and headaches. Negative for tremors.  Endo/Heme/Allergies: Bruises/bleeds easily.      Objective  Filed Vitals:   07/26/15 1313  BP: 163/92  Pulse: 62  Temp: 98.7 F (37.1 C)  TempSrc: Oral  Resp: 16  Height: 5\' 3"  (1.6 m)  Weight: 179 lb (81.194 kg)  SpO2: 99%     Physical Exam  Constitutional: She is well-developed, well-nourished, and in no distress. No distress.  HENT:  Head: Normocephalic and atraumatic.  Right Ear: External ear normal.  Nose: Rhinorrhea (clear) present.  Mouth/Throat: Oropharynx is clear and moist.  Cardiovascular: Normal rate, regular rhythm and normal heart sounds.  Exam reveals no gallop and no friction rub.   No murmur heard. Pulmonary/Chest: Effort normal  and breath sounds normal. No respiratory distress. She has no wheezes. She has no rales.  Coarse breath sounds throughout.  Abdominal: Soft. Bowel sounds are normal. She exhibits no distension and no mass. There is no tenderness.  No hepatoslpenomegly  Skin:  Multiple bruises on bilateral arms.  Bruises on legs have resolved.  No trauma reported.  Vitals reviewed.     No results found for this or any previous visit (from the past 2160 hour(s)).   Assessment & Plan  1. Bruising, spontaneous  - CBC with Differential - Comprehensive Metabolic Panel (CMET)  2. Bronchitis  -  amoxicillin-clavulanate (AUGMENTIN) 875-125 MG tablet; Take 1 tablet by mouth 2 (two) times daily.  Dispense: 20 tablet; Refill: 0

## 2015-07-26 NOTE — Patient Instructions (Signed)
Will discuss results of lab work with patient as soon as available.

## 2015-07-27 ENCOUNTER — Inpatient Hospital Stay
Admission: EM | Admit: 2015-07-27 | Discharge: 2015-07-29 | DRG: 951 | Disposition: A | Discharge status: Home / Self Care | Payer: Commercial Managed Care - HMO | Attending: Internal Medicine | Admitting: Internal Medicine

## 2015-07-27 ENCOUNTER — Encounter: Payer: Self-pay | Admitting: Emergency Medicine

## 2015-07-27 DIAGNOSIS — Z0389 Encounter for observation for other suspected diseases and conditions ruled out: Principal | ICD-10-CM

## 2015-07-27 DIAGNOSIS — M79606 Pain in leg, unspecified: Secondary | ICD-10-CM | POA: Diagnosis not present

## 2015-07-27 DIAGNOSIS — R040 Epistaxis: Secondary | ICD-10-CM

## 2015-07-27 DIAGNOSIS — G8929 Other chronic pain: Secondary | ICD-10-CM | POA: Diagnosis not present

## 2015-07-27 DIAGNOSIS — F17210 Nicotine dependence, cigarettes, uncomplicated: Secondary | ICD-10-CM | POA: Diagnosis present

## 2015-07-27 DIAGNOSIS — Z79899 Other long term (current) drug therapy: Secondary | ICD-10-CM | POA: Diagnosis not present

## 2015-07-27 DIAGNOSIS — J329 Chronic sinusitis, unspecified: Secondary | ICD-10-CM | POA: Diagnosis not present

## 2015-07-27 DIAGNOSIS — I10 Essential (primary) hypertension: Secondary | ICD-10-CM | POA: Diagnosis not present

## 2015-07-27 DIAGNOSIS — D693 Immune thrombocytopenic purpura: Secondary | ICD-10-CM | POA: Diagnosis not present

## 2015-07-27 DIAGNOSIS — R0981 Nasal congestion: Secondary | ICD-10-CM | POA: Diagnosis present

## 2015-07-27 DIAGNOSIS — M4722 Other spondylosis with radiculopathy, cervical region: Secondary | ICD-10-CM | POA: Diagnosis present

## 2015-07-27 DIAGNOSIS — M797 Fibromyalgia: Secondary | ICD-10-CM | POA: Diagnosis present

## 2015-07-27 DIAGNOSIS — Z7951 Long term (current) use of inhaled steroids: Secondary | ICD-10-CM

## 2015-07-27 DIAGNOSIS — D696 Thrombocytopenia, unspecified: Secondary | ICD-10-CM | POA: Diagnosis present

## 2015-07-27 DIAGNOSIS — E876 Hypokalemia: Secondary | ICD-10-CM | POA: Diagnosis present

## 2015-07-27 LAB — COMPREHENSIVE METABOLIC PANEL
A/G RATIO: 1.3 (ref 1.2–2.2)
ALBUMIN: 3.9 g/dL (ref 3.5–5.0)
ALK PHOS: 94 IU/L (ref 39–117)
ALK PHOS: 81 U/L (ref 38–126)
ALT: 31 IU/L (ref 0–32)
ALT: 34 U/L (ref 14–54)
ANION GAP: 5 (ref 5–15)
AST: 29 IU/L (ref 0–40)
AST: 36 U/L (ref 15–41)
Albumin: 4.2 g/dL (ref 3.5–5.5)
BILIRUBIN TOTAL: 0.3 mg/dL (ref 0.3–1.2)
BUN/Creatinine Ratio: 10 (ref 9–23)
BUN: 9 mg/dL (ref 6–24)
BUN: 13 mg/dL (ref 6–20)
Bilirubin Total: 0.3 mg/dL (ref 0.0–1.2)
CALCIUM: 9.1 mg/dL (ref 8.9–10.3)
CHLORIDE: 103 mmol/L (ref 96–106)
CO2: 24 mmol/L (ref 18–29)
CO2: 25 mmol/L (ref 22–32)
CREATININE: 0.86 mg/dL (ref 0.44–1.00)
Calcium: 9.6 mg/dL (ref 8.7–10.2)
Chloride: 108 mmol/L (ref 101–111)
Creatinine, Ser: 0.92 mg/dL (ref 0.57–1.00)
GFR calc Af Amer: 80 mL/min/{1.73_m2} (ref >59)
GFR calc Af Amer: >60 mL/min (ref >60)
GFR calc non Af Amer: 69 mL/min/{1.73_m2} (ref >59)
GFR calc non Af Amer: >60 mL/min (ref >60)
GLOBULIN, TOTAL: 3.3 g/dL (ref 1.5–4.5)
GLUCOSE: 103 mg/dL — AB (ref 65–99)
Glucose: 76 mg/dL (ref 65–99)
POTASSIUM: 4 mmol/L (ref 3.5–5.2)
Potassium: 3.2 mmol/L — ABNORMAL LOW (ref 3.5–5.1)
SODIUM: 142 mmol/L (ref 134–144)
SODIUM: 138 mmol/L (ref 135–145)
TOTAL PROTEIN: 7.7 g/dL (ref 6.5–8.1)
Total Protein: 7.5 g/dL (ref 6.0–8.5)

## 2015-07-27 LAB — CBC WITH DIFFERENTIAL/PLATELET
BASOS ABS: 0.1 10*3/uL (ref 0.0–0.2)
BASOS: 1 %
Basophils Absolute: 0 10*3/uL (ref 0–0.1)
EOS (ABSOLUTE): 0.4 10*3/uL (ref 0.0–0.4)
Eos: 8 %
Eosinophils Absolute: 0 10*3/uL (ref 0–0.7)
Eosinophils Relative: 0 %
HEMATOCRIT: 42.6 % (ref 34.0–46.6)
HEMATOCRIT: 42.1 % (ref 35.0–47.0)
HEMOGLOBIN: 14.5 g/dL (ref 12.0–16.0)
Hemoglobin: 14.5 g/dL (ref 11.1–15.9)
IMMATURE GRANULOCYTES: 1 %
Immature Grans (Abs): 0 10*3/uL (ref 0.0–0.1)
LYMPHS ABS: 1 10*3/uL (ref 1.0–3.6)
Lymphocytes Absolute: 1.7 10*3/uL (ref 0.7–3.1)
Lymphocytes Relative: 15 %
Lymphs: 33 %
MCH: 28.9 pg (ref 26.6–33.0)
MCH: 28.9 pg (ref 26.0–34.0)
MCHC: 34 g/dL (ref 31.5–35.7)
MCHC: 34.4 g/dL (ref 32.0–36.0)
MCV: 85 fL (ref 79–97)
MCV: 84.1 fL (ref 80.0–100.0)
MONOS ABS: 0.3 10*3/uL (ref 0.1–0.9)
Monocytes Absolute: 0.1 10*3/uL — ABNORMAL LOW (ref 0.2–0.9)
Monocytes: 7 %
NEUTROS ABS: 2.6 10*3/uL (ref 1.4–7.0)
NEUTROS ABS: 5.5 10*3/uL (ref 1.4–6.5)
NEUTROS PCT: 50 %
PLATELETS: 3 10*3/uL — AB (ref 150–379)
Platelets: 61 10*3/uL — ABNORMAL LOW (ref 150–440)
RBC: 5.01 x10E6/uL (ref 3.77–5.28)
RBC: 5.01 MIL/uL (ref 3.80–5.20)
RDW: 15.3 % (ref 12.3–15.4)
RDW: 14.8 % — ABNORMAL HIGH (ref 11.5–14.5)
WBC: 5.1 10*3/uL (ref 3.4–10.8)
WBC: 6.6 10*3/uL (ref 3.6–11.0)

## 2015-07-27 LAB — DIFFERENTIAL
Basophils Absolute: 0 10*3/uL (ref 0–0.1)
Basophils Relative: 1 %
Eosinophils Absolute: 0.5 10*3/uL (ref 0–0.7)
Lymphs Abs: 2 10*3/uL (ref 1.0–3.6)
Monocytes Absolute: 0.4 10*3/uL (ref 0.2–0.9)
Monocytes Relative: 8 %
Neutro Abs: 2.4 10*3/uL (ref 1.4–6.5)

## 2015-07-27 LAB — URINALYSIS COMPLETE WITH MICROSCOPIC (ARMC ONLY)
BACTERIA UA: NONE SEEN
BILIRUBIN URINE: NEGATIVE
GLUCOSE, UA: NEGATIVE mg/dL
Ketones, ur: NEGATIVE mg/dL
Nitrite: NEGATIVE
Protein, ur: 30 mg/dL — AB
Specific Gravity, Urine: 1.025 (ref 1.005–1.030)
pH: 6 (ref 5.0–8.0)

## 2015-07-27 LAB — RAPID HIV SCREEN (HIV 1/2 AB+AG)
HIV 1/2 ANTIBODIES: NONREACTIVE
HIV-1 P24 ANTIGEN - HIV24: NONREACTIVE

## 2015-07-27 LAB — PATHOLOGIST SMEAR REVIEW: PATH REVIEW: ADEQUATE

## 2015-07-27 LAB — PROTIME-INR
INR: 0.91
PROTHROMBIN TIME: 12.5 s (ref 11.4–15.0)

## 2015-07-27 LAB — CBC
HEMATOCRIT: 41.7 % (ref 35.0–47.0)
HEMOGLOBIN: 14.1 g/dL (ref 12.0–16.0)
MCH: 28.4 pg (ref 26.0–34.0)
MCHC: 33.8 g/dL (ref 32.0–36.0)
MCV: 84.1 fL (ref 80.0–100.0)
Platelets: 11 10*3/uL — CL (ref 150–440)
RBC: 4.96 MIL/uL (ref 3.80–5.20)
RDW: 14.6 % — ABNORMAL HIGH (ref 11.5–14.5)
WBC: 5.4 10*3/uL (ref 3.6–11.0)

## 2015-07-27 LAB — APTT: APTT: 28 s (ref 24–36)

## 2015-07-27 LAB — ABO/RH: ABO/RH(D): A POS

## 2015-07-27 LAB — FIBRINOGEN: Fibrinogen: 402 mg/dL (ref 210–470)

## 2015-07-27 LAB — LACTATE DEHYDROGENASE: LDH: 209 U/L — ABNORMAL HIGH (ref 98–192)

## 2015-07-27 MED ORDER — AMLODIPINE BESYLATE 10 MG PO TABS
10.0000 mg | ORAL_TABLET | Freq: Every day | ORAL | Status: DC
  Administered 2015-07-27 – 2015-07-29 (×3): 10 mg via ORAL
  Filled 2015-07-27 (×3): qty 1

## 2015-07-27 MED ORDER — SODIUM CHLORIDE 0.9 % IV SOLN
10.0000 mL/h | Freq: Once | INTRAVENOUS | Status: AC
  Administered 2015-07-27: 10 mL/h via INTRAVENOUS

## 2015-07-27 MED ORDER — SENNOSIDES-DOCUSATE SODIUM 8.6-50 MG PO TABS: 1.0000 | ORAL_TABLET | Freq: Every evening | ORAL | Status: DC | PRN

## 2015-07-27 MED ORDER — HYDROCHLOROTHIAZIDE 25 MG PO TABS
25.0000 mg | ORAL_TABLET | Freq: Every day | ORAL | Status: DC
  Administered 2015-07-27 – 2015-07-29 (×3): 25 mg via ORAL
  Filled 2015-07-27 (×3): qty 1

## 2015-07-27 MED ORDER — PREDNISONE 20 MG PO TABS
80.0000 mg | ORAL_TABLET | ORAL | Status: AC
  Administered 2015-07-27: 80 mg via ORAL
  Filled 2015-07-27: qty 4

## 2015-07-27 MED ORDER — PREDNISONE 20 MG PO TABS
80.0000 mg | ORAL_TABLET | Freq: Every day | ORAL | Status: DC
  Administered 2015-07-28 – 2015-07-29 (×2): 80 mg via ORAL
  Filled 2015-07-27 (×2): qty 4

## 2015-07-27 MED ORDER — SODIUM CHLORIDE 0.9 % IV SOLN
INTRAVENOUS | Status: DC
  Administered 2015-07-27 – 2015-07-29 (×4): via INTRAVENOUS

## 2015-07-27 MED ORDER — ONDANSETRON HCL 4 MG PO TABS: 4.0000 mg | ORAL_TABLET | Freq: Four times a day (QID) | ORAL | Status: DC | PRN

## 2015-07-27 MED ORDER — ACETAMINOPHEN 325 MG PO TABS
650.0000 mg | ORAL_TABLET | Freq: Four times a day (QID) | ORAL | Status: DC | PRN
  Administered 2015-07-28: 05:00:00 650 mg via ORAL
  Filled 2015-07-27: qty 2

## 2015-07-27 MED ORDER — ACETAMINOPHEN 650 MG RE SUPP: 650.0000 mg | Freq: Four times a day (QID) | RECTAL | Status: DC | PRN

## 2015-07-27 MED ORDER — CLONAZEPAM 0.5 MG PO TABS: 0.5000 mg | ORAL_TABLET | Freq: Three times a day (TID) | ORAL | Status: DC | PRN

## 2015-07-27 MED ORDER — HYDROCODONE-ACETAMINOPHEN 5-325 MG PO TABS
1.0000 | ORAL_TABLET | ORAL | Status: DC | PRN
  Administered 2015-07-28 (×2): 2 via ORAL
  Filled 2015-07-27 (×2): qty 2
  Filled 2015-07-27: qty 1

## 2015-07-27 MED ORDER — HYDRALAZINE HCL 20 MG/ML IJ SOLN: 10.0000 mg | Freq: Four times a day (QID) | INTRAMUSCULAR | Status: DC | PRN

## 2015-07-27 MED ORDER — AMOXICILLIN-POT CLAVULANATE 875-125 MG PO TABS
1.0000 | ORAL_TABLET | Freq: Two times a day (BID) | ORAL | Status: DC
  Administered 2015-07-27 – 2015-07-29 (×5): 1 via ORAL
  Filled 2015-07-27 (×5): qty 1

## 2015-07-27 MED ORDER — ONDANSETRON HCL 4 MG/2ML IJ SOLN
4.0000 mg | Freq: Four times a day (QID) | INTRAMUSCULAR | Status: DC | PRN
  Administered 2015-07-28: 4 mg via INTRAVENOUS
  Filled 2015-07-27: qty 2

## 2015-07-27 MED ORDER — POTASSIUM CHLORIDE 20 MEQ/15ML (10%) PO SOLN
40.0000 meq | Freq: Once | ORAL | Status: AC
  Administered 2015-07-27: 14:00:00 40 meq via ORAL
  Filled 2015-07-27: qty 30

## 2015-07-27 NOTE — ED Notes (Signed)
Lab called for add ons.

## 2015-07-27 NOTE — ED Notes (Addendum)
Patient ambulatory to triage with steady gait, without difficulty or distress noted; pt reports called by PCP to come to ED for low platelets; st recent bruising; purplish bruising noted to arms

## 2015-07-27 NOTE — ED Notes (Signed)
Receiving RN states she will start the ns gtt when pt gets to floor.

## 2015-07-27 NOTE — ED Notes (Signed)
Pt reports getting a call from PCP directing her to the ED d/t platelet count of 3 (assume 150-400), pt denies N/V/D/HA, s/sx of any sickness, reports hx of fibromyalgia and sporadically taking lyrica for that.  Pt reports no family hx of cytopenia ("blood disorder... Hemophilia...")  Pt with bruising to right arm from blood  Pressure cuff

## 2015-07-27 NOTE — H&P (Signed)
[name] NAME: Erica Bartlett    MR#:  [id number]  DATE OF BIRTH:  [birth year]  DATE OF ADMISSION:  07/27/2015  PRIMARY CARE PHYSICIAN: Dicky Doe, MD   REQUESTING/REFERRING PHYSICIAN: Dr Karma Greaser  CHIEF COMPLAINT:   Low platelet count sent from PCP HISTORY OF PRESENT ILLNESS:  Erica Bartlett  is a 60 y.o. female with a known history of  HTN who was called by her PCP for a platelet count of 3. Patient had routine labs drawn yesterday by her PCP. She has been having nose bleeds and bruising.  Denies hematuria or hematochezia.  PAST MEDICAL HISTORY:   Past Medical History  Diagnosis Date  . Hypertension     PAST SURGICAL HISTORY:   Past Surgical History  Procedure Laterality Date  . Cesarean section    . Cyst removed      breast, benign  . Abdominal hysterectomy      SOCIAL HISTORY:   Social History  Substance Use Topics  . Smoking status: Current Some Day Smoker  . Smokeless tobacco: Never Used  . Alcohol Use: No    FAMILY HISTORY:  No hx of low platelets or cancer  DRUG ALLERGIES:  No Known Allergies   REVIEW OF SYSTEMS:  CONSTITUTIONAL: No fever, fatigue or weakness.  EYES: No blurred or double vision.  EARS, NOSE, AND THROAT: No tinnitus or ear pain.  RESPIRATORY: No cough, shortness of breath, wheezing or hemoptysis.  CARDIOVASCULAR: No chest pain, orthopnea, edema.  GASTROINTESTINAL: No nausea, vomiting, diarrhea or abdominal pain.  GENITOURINARY: No dysuria, hematuria.  ENDOCRINE: No polyuria, nocturia,  HEMATOLOGY: No anemia, ++easy bruising and nose bleeding SKIN: No rash or lesion. MUSCULOSKELETAL: No joint pain or arthritis.   NEUROLOGIC: No tingling, numbness, weakness.  PSYCHIATRY: No anxiety or depression.   MEDICATIONS AT HOME:   Prior to Admission medications   Medication Sig Start Date End Date Taking? Authorizing Provider  acetaminophen (TYLENOL) 500 MG tablet  Take 500 mg by mouth every 6 (six) hours as needed.   Yes Historical Provider, MD  albuterol (PROVENTIL HFA;VENTOLIN HFA) 108 (90 BASE) MCG/ACT inhaler Inhale 2 puffs into the lungs every 6 (six) hours as needed for wheezing or shortness of breath. 03/23/15  Yes Amy Overton Mam, NP  amLODipine (NORVASC) 10 MG tablet Take 1 tablet (10 mg total) by mouth daily. 10/14/14  Yes Arlis Porta., MD  amoxicillin-clavulanate (AUGMENTIN) 875-125 MG tablet Take 1 tablet by mouth 2 (two) times daily. 07/26/15  Yes Arlis Porta., MD  clonazePAM (KLONOPIN) 1 MG tablet Take 1-2 mg by mouth 3 (three) times daily as needed.    Yes Historical Provider, MD  cyclobenzaprine (FLEXERIL) 10 MG tablet Take 1 tablet (10 mg total) by mouth 3 (three) times daily as needed for muscle spasms. 10/14/14  Yes Arlis Porta., MD  hydrochlorothiazide (HYDRODIURIL) 25 MG tablet Take 25 mg by mouth daily.   Yes Historical Provider, MD  pregabalin (LYRICA) 75 MG capsule Take 1 capsule (75 mg total) by mouth 3 (three) times daily. 06/28/15  Yes Arlis Porta., MD      VITAL SIGNS:  Blood pressure 176/100, pulse 72, temperature 98.1 F (36.7 C), temperature source Oral, resp. rate 23, height 5\' 3"  (1.6 m), weight 81.194 kg (179 lb), SpO2 99 %.  PHYSICAL EXAMINATION:  GENERAL:  60 y.o.-year-old patient lying in the bed with no acute distress.  EYES: Pupils equal,  round, reactive to light and accommodation. No scleral icterus. Extraocular muscles intact.  HEENT: Head atraumatic, normocephalic. Oropharynx and nasopharynx clear.  NECK:  Supple, no jugular venous distention. No thyroid enlargement, no tenderness.  LUNGS: Normal breath sounds bilaterally, no wheezing, rales,rhonchi or crepitation. No use of accessory muscles of respiration.  CARDIOVASCULAR: S1, S2 normal. No murmurs, rubs, or gallops.  ABDOMEN: Soft, nontender, nondistended. Bowel sounds present. No organomegaly or mass.  EXTREMITIES: No pedal edema,  cyanosis, or clubbing.  NEUROLOGIC: Cranial nerves II through XII are grossly intact. No focal deficits. PSYCHIATRIC: The patient is alert and oriented x 3.  SKIN: No obvious rash, lesion, or ulcer. Some mild bruising noted on arm  LABORATORY PANEL:   CBC  Recent Labs Lab 07/27/15 0515  WBC 5.4  HGB 14.1  HCT 41.7  PLT 11*   ------------------------------------------------------------------------------------------------------------------  Chemistries   Recent Labs Lab 07/27/15 0515  NA 138  K 3.2*  CL 108  CO2 25  GLUCOSE 103*  BUN 13  CREATININE 0.86  CALCIUM 9.1  AST 36  ALT 34  ALKPHOS 81  BILITOT 0.3   ------------------------------------------------------------------------------------------------------------------  Cardiac Enzymes No results for input(s): TROPONINI in the last 168 hours. ------------------------------------------------------------------------------------------------------------------  RADIOLOGY:  No results found.  EKG:   none IMPRESSION AND PLAN:    60 y/o female with a history of essential hypertension who presents by the request of her PCP for thrombocytopenia.   1. 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 Dr. Grayland Ormond has been consulted by ER physician. Went to review blood smear and follow up on direct platelet antibodies chest. Continue prednisone 1 mg/kg. She has been transfuse 1 unit of platelets.  2. Essential hypertension: Continue Norvasc and HCTZ.  3. Hypokalemia: Replete potassium.  4. Sinusitis: Patient was prescribed Augmentin yesterday by her PCP and I will continue this.  All the records are reviewed and case discussed with ED provider. Management plans discussed with the patient and she is in agreement.  CODE STATUS: FULL  TOTAL TIME TAKING CARE OF THIS PATIENT: 55 minutes.    Jaliana Medellin M.D on 07/27/2015 at 10:57 AM  Between 7am to 6pm - Pager - [phone] After 6pm go to  [email or web address] - password [email or web address] [facility]  Office  [phone]  CC: Primary care physician; Dicky Doe, MD

## 2015-07-27 NOTE — ED Provider Notes (Signed)
[facility] Emergency Department Provider Note  ____________________________________________  Time seen: Approximately 6:29 AM  I have reviewed the triage vital signs and the nursing notes.   HISTORY  Chief Complaint Abnormal Lab    HPI Erica Bartlett is a 60 y.o. female who presents after her primary care doctor called her and told her to go to the emergency department because her platelets were too low.  She reports a gradual onset of increased easy bruising time early in her extremities for about a month.  She saw her primary care doctor who ordered some outpatient labs which were performed tomorrow.  Apparently his results came back during the night and he called her to let her know that her platelets were 3 and that she should come to the ED for evaluation.  The degree of thrombocytopenia as severe.  She has no history of the same.  She has seen no blood in her stool or urine although she has been having nosebleeds recently she has been bruising easily to light touch and even sustained a bruise on her arm from the blood pressure cuff in triage.  She denies fever/chills, chest pain, shortness of breath, nausea, vomiting, diarrhea, dysuria.  Over the last several days she has had some upper respiratory symptoms including nasal congestion and mild cough and was recently diagnosed with bronchitis.  She is not taking any new medications and takes hydrochlorothiazide and amlodipine for her hypertension.     Past Medical History  Diagnosis Date  . Hypertension     Patient Active Problem List   Diagnosis Date Noted  . Thrombocytopenia ([place]) 07/27/2015  . Bronchitis 07/26/2015  . Bruising, spontaneous 07/26/2015  . Chronic leg pain 06/28/2015  . Plantar warts 06/28/2015  . Fibromyalgia 05/19/2015  . Allergic rhinitis 02/18/2015  . Smoker 02/18/2015  . Other spondylosis with radiculopathy, cervical region 10/28/2014  . Other shoulder lesions, right  shoulder 10/28/2014  . Arm pain, chronic 10/14/2014  . Hypertension 10/14/2014    Past Surgical History  Procedure Laterality Date  . Cesarean section    . Cyst removed      breast, benign  . Abdominal hysterectomy      Current Outpatient Rx  Name  Route  Sig  Dispense  Refill  . acetaminophen (TYLENOL) 500 MG tablet   Oral   Take 500 mg by mouth every 6 (six) hours as needed.         Marland Kitchen albuterol (PROVENTIL HFA;VENTOLIN HFA) 108 (90 BASE) MCG/ACT inhaler   Inhalation   Inhale 2 puffs into the lungs every 6 (six) hours as needed for wheezing or shortness of breath.   1 Inhaler   3   . amLODipine (NORVASC) 10 MG tablet   Oral   Take 1 tablet (10 mg total) by mouth daily.   30 tablet   6   . amoxicillin-clavulanate (AUGMENTIN) 875-125 MG tablet   Oral   Take 1 tablet by mouth 2 (two) times daily.   20 tablet   0   . clonazePAM (KLONOPIN) 1 MG tablet   Oral   Take 1-2 mg by mouth 3 (three) times daily as needed.          . cyclobenzaprine (FLEXERIL) 10 MG tablet   Oral   Take 1 tablet (10 mg total) by mouth 3 (three) times daily as needed for muscle spasms.   30 tablet   1   . hydrochlorothiazide (HYDRODIURIL) 25 MG tablet   Oral  Take 25 mg by mouth daily.         . pregabalin (LYRICA) 75 MG capsule   Oral   Take 1 capsule (75 mg total) by mouth 3 (three) times daily.   90 capsule   5     Allergies Review of patient's allergies indicates no known allergies.  No family history on file.  Social History Social History  Substance Use Topics  . Smoking status: Current Some Day Smoker  . Smokeless tobacco: Never Used  . Alcohol Use: No    Review of Systems Constitutional: No fever/chills Eyes: No visual changes. ENT: No sore throat. Occasional epistaxis Cardiovascular: Denies chest pain. Respiratory: Denies shortness of breath. Gastrointestinal: No abdominal pain.  No nausea, no vomiting.  No diarrhea.  No constipation. Genitourinary:  Negative for dysuria. Musculoskeletal: Negative for back pain. Skin: Negative for rash.  Easy bruising recently Neurological: Negative for headaches, focal weakness or numbness.  10-point ROS otherwise negative.  ____________________________________________   PHYSICAL EXAM:  VITAL SIGNS: ED Triage Vitals  Enc Vitals Group     BP 07/27/15 0512 176/100 mmHg     Pulse Rate 07/27/15 0512 75     Resp 07/27/15 0512 18     Temp 07/27/15 0512 98.1 F (36.7 C)     Temp Source 07/27/15 0512 Oral     SpO2 07/27/15 0512 100 %     Weight 07/27/15 0512 179 lb (81.194 kg)     Height 07/27/15 0512 5\' 3"  (1.6 m)     Head Cir --      Peak Flow --      Pain Score --      Pain Loc --      Pain Edu? --      Excl. in [place]? --     Constitutional: Alert and oriented. Well appearing and in no acute distress. Eyes: Conjunctivae are normal. PERRL. EOMI. Head: Atraumatic. Nose: No congestion/rhinnorhea. Mouth/Throat: Mucous membranes are moist.  Oropharynx non-erythematous. Neck: No stridor.  No meningeal signs.   Cardiovascular: Normal rate, regular rhythm. Good peripheral circulation. Grossly normal heart sounds.   Respiratory: Normal respiratory effort.  No retractions. Lungs CTAB. Gastrointestinal: Soft and nontender. No distention. Deferred rectal exam as it will not be particularly clinically useful Musculoskeletal: No lower extremity tenderness nor edema. No gross deformities of extremities. Neurologic:  Normal speech and language. No gross focal neurologic deficits are appreciated.  Skin:  Skin is warm, dry and intact. No rash noted that the patient does have several areas of ecchymosis Psychiatric: Mood and affect are normal. Speech and behavior are normal.  ____________________________________________   LABS (all labs ordered are listed, but only abnormal results are displayed)  Labs Reviewed  CBC - Abnormal; Notable for the following:    RDW 14.6 (*)    Platelets 11 (*)    All  other components within normal limits  COMPREHENSIVE METABOLIC PANEL - Abnormal; Notable for the following:    Potassium 3.2 (*)    Glucose, Bld 103 (*)    All other components within normal limits  URINALYSIS COMPLETEWITH MICROSCOPIC ([facility] ONLY) - Abnormal; Notable for the following:    Color, Urine YELLOW (*)    APPearance CLEAR (*)    Hgb urine dipstick 2+ (*)    Protein, ur 30 (*)    Leukocytes, UA TRACE (*)    Squamous Epithelial / LPF 0-5 (*)    All other components within normal limits  LACTATE DEHYDROGENASE - Abnormal; Notable for the  following:    LDH 209 (*)    All other components within normal limits  PROTIME-INR  APTT  RAPID HIV SCREEN (HIV 1/2 AB+AG)  FIBRINOGEN  DIFFERENTIAL  PATHOLOGIST SMEAR REVIEW  DIRECT PLATELET ANTIBODY  ABO/RH  PREPARE PLATELET PHERESIS   ____________________________________________  EKG  None ____________________________________________  RADIOLOGY   No results found.  ____________________________________________   PROCEDURES  Procedure(s) performed: None  Critical Care performed: No ____________________________________________   INITIAL IMPRESSION / ASSESSMENT AND PLAN / ED COURSE  Pertinent labs & imaging results that were available during my care of the patient were reviewed by me and considered in my medical decision making (see chart for details).  The patient's thrombocytopenia is severe.  She is not at all ill or toxic appearing.  There is nothing to suggest that she has an acute thrombosis.  She has had some recent viral symptoms but she has had easy bruising since well before that time.  She has no new medications that would suggest a medication effect and has not been recently on heparin.  I added on additional labs such as LDH and fibrinogen and coagulation studies, all of which were unremarkable.  Her repeat platelet count was slightly higher in this emergency department at 11, but still severely low.  I  called and spoke by phone with Dr. Alyssa Grove who recommended that we provide prednisone 1 mg/kg after sending a direct platelet antibody test and that we set her up for transfusion of 1 unit of platelets.  He also recommended that we admit given the severity of thrombocytopenia.  He will see her in the hospital.  I verbally consented the patient for blood products with my usual and customary discussion.  I have placed the orders as requested and spoken with the hospitalist.  ____________________________________________  FINAL CLINICAL IMPRESSION(S) / ED DIAGNOSES  Final diagnoses:  Severe thrombocytopenia ([place])  ITP (idiopathic thrombocytopenic purpura)      NEW MEDICATIONS STARTED DURING THIS VISIT:  New Prescriptions   No medications on file      Note:  This document was prepared using Dragon voice recognition software and may include unintentional dictation errors.   Hinda Kehr, MD 07/27/15 [phone]

## 2015-07-27 NOTE — Consult Note (Signed)
[place]  [phone] [phone]) [phone]  ID: Erica Bartlett OB: Sep 09, 1955  MR#: [id number]  MNO#:[id number]  Patient Care Team: Arlis Porta., MD as PCP - General (Family Medicine)  CHIEF COMPLAINT:  Chief Complaint  Patient presents with  . Abnormal Lab    INTERVAL HISTORY: Patient is a 60 year old female with no significant past medical history who reported a history of easy bleeding or bruising for approximately one month. Upon evaluation at her primary care physician she was noted to have a platelet count of 3 and was referred to the ER for further evaluation. She reports no recent fevers or illnesses. She has no new medications. She denies any night sweats or weight loss. She has no neurologic complaints. She denies any chest pain or shortness of breath. She has no nausea, vomiting, constipation, or diarrhea. She has no urinary complaints. Patient otherwise feels well and offers no further specific complaints.  REVIEW OF SYSTEMS:   Review of Systems  Constitutional: Negative for fever, weight loss, malaise/fatigue and diaphoresis.  HENT: Positive for nosebleeds.   Respiratory: Negative.  Negative for cough, hemoptysis and shortness of breath.   Cardiovascular: Negative.  Negative for chest pain.  Gastrointestinal: Negative.  Negative for blood in stool and melena.  Genitourinary: Negative.  Negative for hematuria.  Musculoskeletal: Negative.   Skin: Negative for rash.  Neurological: Negative.  Negative for weakness.  Endo/Heme/Allergies: Bruises/bleeds easily.    As per HPI. Otherwise, a complete review of systems is negatve.  PAST MEDICAL HISTORY: Past Medical History  Diagnosis Date  . Hypertension     PAST SURGICAL HISTORY: Past Surgical History  Procedure Laterality Date  . Cesarean section    . Cyst removed      breast, benign  . Abdominal hysterectomy      FAMILY HISTORY:  Reviewed and unchanged. No reported  history of malignancy or chronic disease.     ADVANCED DIRECTIVES:    HEALTH MAINTENANCE: Social History  Substance Use Topics  . Smoking status: Current Some Day Smoker  . Smokeless tobacco: Never Used  . Alcohol Use: No     Colonoscopy:  PAP:  Bone density:  Lipid panel:  No Known Allergies  Current Facility-Administered Medications  Medication Dose Route Frequency Provider Last Rate Last Dose  . 0.9 %  sodium chloride infusion   Intravenous Continuous Bettey Costa, MD 75 mL/hr at 07/27/15 0957    . acetaminophen (TYLENOL) tablet 650 mg  650 mg Oral Q6H PRN Bettey Costa, MD       Or  . acetaminophen (TYLENOL) suppository 650 mg  650 mg Rectal Q6H PRN Bettey Costa, MD      . amLODipine (NORVASC) tablet 10 mg  10 mg Oral Daily Bettey Costa, MD   10 mg at 07/27/15 0958  . amoxicillin-clavulanate (AUGMENTIN) 875-125 MG per tablet 1 tablet  1 tablet Oral BID Bettey Costa, MD   1 tablet at 07/27/15 2027  . clonazePAM (KLONOPIN) tablet 0.5 mg  0.5 mg Oral TID PRN Bettey Costa, MD      . hydrALAZINE (APRESOLINE) injection 10 mg  10 mg Intravenous Q6H PRN Bettey Costa, MD      . hydrochlorothiazide (HYDRODIURIL) tablet 25 mg  25 mg Oral Daily Bettey Costa, MD   25 mg at 07/27/15 0958  . HYDROcodone-acetaminophen (NORCO/VICODIN) 5-325 MG per tablet 1-2 tablet  1-2 tablet Oral Q4H PRN Bettey Costa, MD      . ondansetron (ZOFRAN) tablet 4 mg  4 mg Oral Q6H PRN Bettey Costa, MD       Or  . ondansetron (ZOFRAN) injection 4 mg  4 mg Intravenous Q6H PRN Bettey Costa, MD      . Derrill Memo ON 07/28/2015] predniSONE (DELTASONE) tablet 80 mg  80 mg Oral Q breakfast Sital Mody, MD      . senna-docusate (Senokot-S) tablet 1 tablet  1 tablet Oral QHS PRN Bettey Costa, MD        OBJECTIVE: Filed Vitals:   07/27/15 1619 07/27/15 2243  BP: 143/82 121/59  Pulse: 66 72  Temp: 98 F (36.7 C) 98 F (36.7 C)  Resp: 19 18     Body mass index is 31.72 kg/(m^2).    ECOG FS:0 - Asymptomatic  General: Well-developed,  well-nourished, no acute distress. Eyes: Pink conjunctiva, anicteric sclera. HEENT: Normocephalic, moist mucous membranes, clear oropharnyx. Lungs: Clear to auscultation bilaterally. Heart: Regular rate and rhythm. No rubs, murmurs, or gallops. Abdomen: Soft, nontender, nondistended. No organomegaly noted, normoactive bowel sounds. Musculoskeletal: No edema, cyanosis, or clubbing. Neuro: Alert, answering all questions appropriately. Cranial nerves grossly intact. Skin: Multiple bruises noted on extremities. Psych: Normal affect. Lymphatics: No cervical, calvicular, axillary or inguinal LAD.   LAB RESULTS:  Lab Results  Component Value Date   NA 138 07/27/2015   K 3.2* 07/27/2015   CL 108 07/27/2015   CO2 25 07/27/2015   GLUCOSE 103* 07/27/2015   BUN 13 07/27/2015   CREATININE 0.86 07/27/2015   CALCIUM 9.1 07/27/2015   PROT 7.7 07/27/2015   ALBUMIN 3.9 07/27/2015   AST 36 07/27/2015   ALT 34 07/27/2015   ALKPHOS 81 07/27/2015   BILITOT 0.3 07/27/2015   GFRNONAA >60 07/27/2015   GFRAA >60 07/27/2015    Lab Results  Component Value Date   WBC 6.6 07/27/2015   NEUTROABS 5.5 07/27/2015   HGB 14.5 07/27/2015   HCT 42.1 07/27/2015   MCV 84.1 07/27/2015   PLT 61* 07/27/2015     STUDIES: No results found.  ASSESSMENT: Thrombocytopenia, possibly ITP.  PLAN:    1. Thrombocytopenia: Patient has no evidence of hemolytic anemia therefore this is unlikely TTP. She has no recent heparin use. She denies any fevers or illnesses and has no new medications. She has no symptoms suggesting underlying lymphoma or other malignancy. HIV is negative. Platelet antibodies are pending at time of dictation.  With no definitive etiology, this is likely ITP. Patient received one unit of platelets and was initiated on 80 mg oral prednisone daily or 1 mg/kg. Her platelet count has now improved to 61, further supporting the diagnosis of ITP. She will likely require bone marrow biopsy in the future,  but this possibly could be accomplished as an outpatient. If patient's platelet count remains improved, she can be discharged on prednisone and follow-up in the [place] within 1 week.  Appreciate consult, will follow.   Lloyd Huger, MD   07/27/2015 11:13 PM

## 2015-07-27 NOTE — ED Notes (Signed)
Pt on phone with family, remains encouraged in her faith.

## 2015-07-27 NOTE — ED Notes (Signed)
Critical lab, PLATLETS: 11; Sheryl, lab; Dr Karma Greaser confirmed

## 2015-07-28 ENCOUNTER — Encounter: Payer: Self-pay | Admitting: Radiology

## 2015-07-28 ENCOUNTER — Inpatient Hospital Stay: Payer: Commercial Managed Care - HMO

## 2015-07-28 LAB — PREPARE PLATELET PHERESIS: Unit division: 0

## 2015-07-28 LAB — BASIC METABOLIC PANEL
ANION GAP: 4 — AB (ref 5–15)
BUN: 9 mg/dL (ref 6–20)
CHLORIDE: 110 mmol/L (ref 101–111)
CO2: 23 mmol/L (ref 22–32)
Calcium: 9 mg/dL (ref 8.9–10.3)
Creatinine, Ser: 0.76 mg/dL (ref 0.44–1.00)
GFR calc Af Amer: >60 mL/min (ref >60)
GFR calc non Af Amer: >60 mL/min (ref >60)
GLUCOSE: 96 mg/dL (ref 65–99)
POTASSIUM: 3.3 mmol/L — AB (ref 3.5–5.1)
Sodium: 137 mmol/L (ref 135–145)

## 2015-07-28 LAB — CBC
HEMATOCRIT: 39.5 % (ref 35.0–47.0)
HEMOGLOBIN: 13.3 g/dL (ref 12.0–16.0)
MCH: 28.7 pg (ref 26.0–34.0)
MCHC: 33.7 g/dL (ref 32.0–36.0)
MCV: 85.3 fL (ref 80.0–100.0)
Platelets: 33 10*3/uL — ABNORMAL LOW (ref 150–440)
RBC: 4.63 MIL/uL (ref 3.80–5.20)
RDW: 15.1 % — ABNORMAL HIGH (ref 11.5–14.5)
WBC: 8.5 10*3/uL (ref 3.6–11.0)

## 2015-07-28 LAB — MAGNESIUM: MAGNESIUM: 2.1 mg/dL (ref 1.7–2.4)

## 2015-07-28 MED ORDER — FENTANYL CITRATE (PF) 100 MCG/2ML IJ SOLN
INTRAMUSCULAR | Status: AC | PRN
  Administered 2015-07-28: 25 ug via INTRAVENOUS
  Administered 2015-07-28: 50 ug via INTRAVENOUS

## 2015-07-28 MED ORDER — FENTANYL CITRATE (PF) 100 MCG/2ML IJ SOLN
INTRAMUSCULAR | Status: AC
  Filled 2015-07-28: qty 4

## 2015-07-28 MED ORDER — SODIUM CHLORIDE 0.9 % IV SOLN: INTRAVENOUS | Status: DC

## 2015-07-28 MED ORDER — POTASSIUM CHLORIDE CRYS ER 20 MEQ PO TBCR
40.0000 meq | EXTENDED_RELEASE_TABLET | Freq: Once | ORAL | Status: AC
  Administered 2015-07-28: 14:00:00 40 meq via ORAL
  Filled 2015-07-28: qty 2

## 2015-07-28 MED ORDER — HEPARIN SOD (PORK) LOCK FLUSH 100 UNIT/ML IV SOLN
INTRAVENOUS | Status: AC
  Filled 2015-07-28: qty 5

## 2015-07-28 NOTE — Plan of Care (Signed)
Problem: Nutrition: Goal: Adequate nutrition will be maintained Outcome: Progressing Pt had Bone marrow biopsy today, dressing dry and intact, PRN pain meds for headache with improvement, no distress noted, pt voices any needs

## 2015-07-28 NOTE — Progress Notes (Signed)
Pharmacy Consult for electrolyte replacment  Labs:   Recent Labs  07/26/15 1358 07/27/15 0515 07/28/15 0515  NA 142 138 137  K 4.0 3.2* 3.3*  CL 103 108 110  CO2 24 25 23   GLUCOSE 76 103* 96  BUN 9 13 9   CREATININE 0.92 0.86 0.76  CALCIUM 9.6 9.1 9.0  PROT 7.5 7.7  --   ALBUMIN 4.2 3.9  --   AST 29 36  --   ALT 31 34  --   ALKPHOS 94 81  --   BILITOT 0.3 0.3  --    Estimated Creatinine Clearance: 78.3 mL/min (by C-G formula based on Cr of 0.76).   No results for input(s): GLUCAP in the last 72 hours.  Plan:  Ordered potassium 76mEq PO x 1, will add on magnesium level. Recheck tomorrow AM  Tierre Gerard C 07/28/2015,3:05 PM

## 2015-07-28 NOTE — Progress Notes (Signed)
[place] [facility] [place] NAME: Erica Bartlett    MR#:  [id number]  DATE OF BIRTH:  1956/04/12  SUBJECTIVE:   patient back from BM biopsy  She is doing well no issues overnight  REVIEW OF SYSTEMS:    Review of Systems  Constitutional: Negative for fever, chills and malaise/fatigue.  HENT: Negative for ear discharge, ear pain, hearing loss, nosebleeds and sore throat.   Eyes: Negative for blurred vision and pain.  Respiratory: Negative for cough, hemoptysis, shortness of breath and wheezing.   Cardiovascular: Negative for chest pain, palpitations and leg swelling.  Gastrointestinal: Negative for nausea, vomiting, abdominal pain, diarrhea and blood in stool.  Genitourinary: Negative for dysuria.  Musculoskeletal: Negative for back pain.  Neurological: Negative for dizziness, tremors, speech change, focal weakness, seizures and headaches.  Endo/Heme/Allergies: Bruises/bleeds easily.  Psychiatric/Behavioral: Negative for depression, suicidal ideas and hallucinations.    Tolerating Diet:yes      DRUG ALLERGIES:  No Known Allergies  VITALS:  Blood pressure 158/84, pulse 64, temperature 98.6 F (37 C), temperature source Oral, resp. rate 13, height '5\' 3"'  (1.6 m), weight 81.194 kg (179 lb), SpO2 94 %.  PHYSICAL EXAMINATION:   Physical Exam  Constitutional: She is oriented to person, place, and time and well-developed, well-nourished, and in no distress. No distress.  HENT:  Head: Normocephalic.  Eyes: No scleral icterus.  Neck: Normal range of motion. Neck supple. No JVD present. No tracheal deviation present.  Cardiovascular: Normal rate, regular rhythm and normal heart sounds.  Exam reveals no gallop and no friction rub.   No murmur heard. Pulmonary/Chest: Effort normal and breath sounds normal. No respiratory distress. She has no wheezes. She has no rales. She exhibits no tenderness.  Abdominal: Soft. Bowel sounds are  normal. She exhibits no distension and no mass. There is no tenderness. There is no rebound and no guarding.  Musculoskeletal: Normal range of motion. She exhibits no edema.  Neurological: She is alert and oriented to person, place, and time.  Skin: Skin is warm. No rash noted. No erythema.  She is some bruising on her arms  Psychiatric: Affect and judgment normal.      LABORATORY PANEL:   CBC  Recent Labs Lab 07/28/15 0515  WBC 8.5  HGB 13.3  HCT 39.5  PLT 33*   ------------------------------------------------------------------------------------------------------------------  Chemistries   Recent Labs Lab 07/27/15 0515 07/28/15 0515  NA 138 137  K 3.2* 3.3*  CL 108 110  CO2 25 23  GLUCOSE 103* 96  BUN 13 9  CREATININE 0.86 0.76  CALCIUM 9.1 9.0  AST 36  --   ALT 34  --   ALKPHOS 81  --   BILITOT 0.3  --    ------------------------------------------------------------------------------------------------------------------  Cardiac Enzymes No results for input(s): TROPONINI in the last 168 hours. ------------------------------------------------------------------------------------------------------------------  RADIOLOGY:  Ct Biopsy  07/28/2015  INDICATION: Thrombocytopenia of uncertain etiology. Please perform CT-guided bone marrow biopsy for tissue diagnostic purposes. EXAM: CT-GUIDED BONE MARROW BIOPSY AND ASPIRATION MEDICATIONS: None ANESTHESIA/SEDATION: Fentanyl 75 mcg IV Sedation Time: 9 minutes; The patient was continuously monitored during the procedure by the interventional radiology nurse under my direct supervision. COMPLICATIONS: None immediate. PROCEDURE: Informed consent was obtained from the patient following an explanation of the procedure, risks, benefits and alternatives. The patient understands, agrees and consents for the procedure. All questions were addressed. A time out was performed prior to the initiation of the procedure. The patient was  positioned prone and  non-contrast localization CT was performed of the pelvis to demonstrate the iliac marrow spaces. The operative site was prepped and draped in the usual sterile fashion. Under sterile conditions and local anesthesia, a 22 gauge spinal needle was utilized for procedural planning. Next, an 11 gauge coaxial bone biopsy needle was advanced into the left iliac marrow space. Needle position was confirmed with CT imaging. Initially, bone marrow aspiration was performed. Next, a bone marrow biopsy was obtained with the 11 gauge outer bone marrow device. Samples were prepared with the cytotechnologist and deemed adequate. The needle was removed intact. Hemostasis was obtained with compression and a dressing was placed. The patient tolerated the procedure well without immediate post procedural complication. IMPRESSION: Successful CT guided left iliac bone marrow aspiration and core biopsy. Electronically Signed   By: Sandi Mariscal M.D.   On: 07/28/2015 11:54     ASSESSMENT AND PLAN:    60 year old female with a history of essential hypertension who presented from her PCPs office with a  platlet count of 3.  1.  Thrombocytopenia: this is likely ITP  She has no evidence of hemolytic anemia, so TTP has been ruled ou. She underwent bone marrow biopsy today. Appreciate ONCOLOGY evaluation.  Continue prednisone and repeat PLT count in am.  2. Essential hypertension: Continue Norvasc and HCTZ.  3. Hypokalemia: Resolved 4. Sinusitis: Patient was prescribed Augmentin by her PCP on 4/3. She needs treatment for 10 days total.  Management plans discussed with the patient and she is in agreement.  CODE STATUS: FULL  TOTAL TIME TAKING CARE OF THIS PATIENT: 30 minutes.     POSSIBLE D/C 1-2 days, DEPENDING ON CLINICAL CONDITION.   Erica Bartlett M.D on 07/28/2015 at 12:55 PM  Between 7am to 6pm - Pager - [phone] After 6pm go to [email or web address] - password [email or web address]  [facility]  Office  [phone]  CC: Primary care physician; Dicky Doe, MD  Note: This dictation was prepared with Dragon dictation along with smaller phrase technology. Any transcriptional errors that result from this process are unintentional.

## 2015-07-28 NOTE — Progress Notes (Signed)
[place]  Telephone:(336) [phone] Fax:(336) [phone]  ID: Erica Bartlett OB: [birth year]  MR#: [id number]  GMW#:[id number]  Patient Care Team: Arlis Porta., MD as PCP - General (Family Medicine)  CHIEF COMPLAINT:  Chief Complaint  Patient presents with  . Abnormal Lab    INTERVAL HISTORY: Mild discomfort from bone marrow biopsy, otherwise feels well and offers no complaints.  REVIEW OF SYSTEMS:   Review of Systems  All other systems reviewed and are negative.   As per HPI. Otherwise, a complete review of systems is negatve.  PAST MEDICAL HISTORY: Past Medical History  Diagnosis Date  . Hypertension     PAST SURGICAL HISTORY: Past Surgical History  Procedure Laterality Date  . Cesarean section    . Cyst removed      breast, benign  . Abdominal hysterectomy      FAMILY HISTORY: Reviewed and unchanged. No reported history of malignancy or chronic disease.     ADVANCED DIRECTIVES:    HEALTH MAINTENANCE: Social History  Substance Use Topics  . Smoking status: Current Some Day Smoker  . Smokeless tobacco: Never Used  . Alcohol Use: No     Colonoscopy:  PAP:  Bone density:  Lipid panel:  No Known Allergies  Current Facility-Administered Medications  Medication Dose Route Frequency Provider Last Rate Last Dose  . 0.9 %  sodium chloride infusion   Intravenous Continuous Bettey Costa, MD 75 mL/hr at 07/28/15 1123    . acetaminophen (TYLENOL) tablet 650 mg  650 mg Oral Q6H PRN Bettey Costa, MD   650 mg at 07/28/15 0528   Or  . acetaminophen (TYLENOL) suppository 650 mg  650 mg Rectal Q6H PRN Bettey Costa, MD      . amLODipine (NORVASC) tablet 10 mg  10 mg Oral Daily Bettey Costa, MD   10 mg at 07/28/15 0924  . amoxicillin-clavulanate (AUGMENTIN) 875-125 MG per tablet 1 tablet  1 tablet Oral BID Bettey Costa, MD   1 tablet at 07/28/15 0924  . clonazePAM (KLONOPIN) tablet 0.5 mg  0.5 mg Oral TID PRN Bettey Costa, MD      . fentaNYL  (SUBLIMAZE) 100 MCG/2ML injection           . heparin lock flush 100 UNIT/ML injection           . hydrALAZINE (APRESOLINE) injection 10 mg  10 mg Intravenous Q6H PRN Bettey Costa, MD      . hydrochlorothiazide (HYDRODIURIL) tablet 25 mg  25 mg Oral Daily Bettey Costa, MD   25 mg at 07/28/15 0924  . HYDROcodone-acetaminophen (NORCO/VICODIN) 5-325 MG per tablet 1-2 tablet  1-2 tablet Oral Q4H PRN Bettey Costa, MD   2 tablet at 07/28/15 1322  . ondansetron (ZOFRAN) tablet 4 mg  4 mg Oral Q6H PRN Bettey Costa, MD       Or  . ondansetron (ZOFRAN) injection 4 mg  4 mg Intravenous Q6H PRN Bettey Costa, MD   4 mg at 07/28/15 1119  . predniSONE (DELTASONE) tablet 80 mg  80 mg Oral Q breakfast Bettey Costa, MD   80 mg at 07/28/15 0819  . senna-docusate (Senokot-S) tablet 1 tablet  1 tablet Oral QHS PRN Bettey Costa, MD        OBJECTIVE: Filed Vitals:   07/28/15 1105 07/28/15 1411  BP:  152/79  Pulse: 64 59  Temp:  97.7 F (36.5 C)  Resp: 13 20     Body mass index is 31.72 kg/(m^2).  ECOG FS:0 - Asymptomatic  General: Well-developed, well-nourished, no acute distress. Eyes: Pink conjunctiva, anicteric sclera. Lungs: Clear to auscultation bilaterally. Heart: Regular rate and rhythm. No rubs, murmurs, or gallops. Abdomen: Soft, nontender, nondistended. No organomegaly noted, normoactive bowel sounds. Musculoskeletal: No edema, cyanosis, or clubbing. Neuro: Alert, answering all questions appropriately. Cranial nerves grossly intact. Skin: No rashes or petechiae noted. Psych: Normal affect.   LAB RESULTS:  Lab Results  Component Value Date   NA 137 07/28/2015   K 3.3* 07/28/2015   CL 110 07/28/2015   CO2 23 07/28/2015   GLUCOSE 96 07/28/2015   BUN 9 07/28/2015   CREATININE 0.76 07/28/2015   CALCIUM 9.0 07/28/2015   PROT 7.7 07/27/2015   ALBUMIN 3.9 07/27/2015   AST 36 07/27/2015   ALT 34 07/27/2015   ALKPHOS 81 07/27/2015   BILITOT 0.3 07/27/2015   GFRNONAA >60 07/28/2015   GFRAA >60  07/28/2015    Lab Results  Component Value Date   WBC 8.5 07/28/2015   NEUTROABS 5.5 07/27/2015   HGB 13.3 07/28/2015   HCT 39.5 07/28/2015   MCV 85.3 07/28/2015   PLT 33* 07/28/2015     STUDIES: Ct Biopsy  07/28/2015  INDICATION: Thrombocytopenia of uncertain etiology. Please perform CT-guided bone marrow biopsy for tissue diagnostic purposes. EXAM: CT-GUIDED BONE MARROW BIOPSY AND ASPIRATION MEDICATIONS: None ANESTHESIA/SEDATION: Fentanyl 75 mcg IV Sedation Time: 9 minutes; The patient was continuously monitored during the procedure by the interventional radiology nurse under my direct supervision. COMPLICATIONS: None immediate. PROCEDURE: Informed consent was obtained from the patient following an explanation of the procedure, risks, benefits and alternatives. The patient understands, agrees and consents for the procedure. All questions were addressed. A time out was performed prior to the initiation of the procedure. The patient was positioned prone and non-contrast localization CT was performed of the pelvis to demonstrate the iliac marrow spaces. The operative site was prepped and draped in the usual sterile fashion. Under sterile conditions and local anesthesia, a 22 gauge spinal needle was utilized for procedural planning. Next, an 11 gauge coaxial bone biopsy needle was advanced into the left iliac marrow space. Needle position was confirmed with CT imaging. Initially, bone marrow aspiration was performed. Next, a bone marrow biopsy was obtained with the 11 gauge outer bone marrow device. Samples were prepared with the cytotechnologist and deemed adequate. The needle was removed intact. Hemostasis was obtained with compression and a dressing was placed. The patient tolerated the procedure well without immediate post procedural complication. IMPRESSION: Successful CT guided left iliac bone marrow aspiration and core biopsy. Electronically Signed   By: Simonne Come M.D.   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  PLAN:    1. Thrombocytopenia: Bone marrow biopsy performed today for further evaluation. Pulmonary results may be available at the end of the week, but typically take 7-10 days.  Patient has no evidence of hemolytic anemia therefore this is unlikely TTP. She has no recent heparin use. She denies any fevers or illnesses and has no new medications. She has no symptoms suggesting underlying lymphoma or other malignancy. HIV is negative. Platelet antibodies are pending at time of dictation. With no definitive etiology, this is likely ITP. Patient received one unit of platelets and was initiated on 80 mg oral prednisone daily or 1 mg/kg. Her platelet count initially improved to 61, but now has trended back down to 33. She does not require transfusion at this time. Continue prednisone and we will  recheck CBC in the morning. If her platelet count remains stable, she can likely be discharged on prednisone and follow-up in the [place] within 1 week.  Will follow.   Lloyd Huger, MD   07/28/2015 2:55 PM

## 2015-07-28 NOTE — Procedures (Signed)
Technically successful CT guided bone marrow aspiration and biopsy of left iliac crest. EBL: None No immediate complications.    SignedSandi Mariscal [phone] 07/28/2015, 11:05 AM

## 2015-07-29 ENCOUNTER — Other Ambulatory Visit: Payer: Self-pay | Admitting: Oncology

## 2015-07-29 DIAGNOSIS — D696 Thrombocytopenia, unspecified: Secondary | ICD-10-CM

## 2015-07-29 LAB — CBC WITH DIFFERENTIAL/PLATELET
BASOS ABS: 0 10*3/uL (ref 0–0.1)
EOS ABS: 0 10*3/uL (ref 0–0.7)
Eosinophils Relative: 0 %
HEMATOCRIT: 38.5 % (ref 35.0–47.0)
Hemoglobin: 13.2 g/dL (ref 12.0–16.0)
Lymphocytes Relative: 19 %
Lymphs Abs: 2 10*3/uL (ref 1.0–3.6)
MCH: 29.1 pg (ref 26.0–34.0)
MCHC: 34.2 g/dL (ref 32.0–36.0)
MCV: 85 fL (ref 80.0–100.0)
MONO ABS: 0.6 10*3/uL (ref 0.2–0.9)
NEUTROS ABS: 8 10*3/uL — AB (ref 1.4–6.5)
PLATELETS: 32 10*3/uL — AB (ref 150–440)
RBC: 4.53 MIL/uL (ref 3.80–5.20)
RDW: 14.8 % — AB (ref 11.5–14.5)
WBC: 10.6 10*3/uL (ref 3.6–11.0)

## 2015-07-29 LAB — POTASSIUM: POTASSIUM: 4.2 mmol/L (ref 3.5–5.1)

## 2015-07-29 MED ORDER — PREDNISONE 20 MG PO TABS: 80.0000 mg | ORAL_TABLET | Freq: Every day | ORAL | Status: DC

## 2015-07-29 NOTE — Progress Notes (Signed)
Pharmacy Consult for electrolyte replacment  Labs:   Recent Labs  07/26/15 1358 07/27/15 0515 07/28/15 0515 07/29/15 0435  NA 142 138 137  --   K 4.0 3.2* 3.3* 4.2  CL 103 108 110  --   CO2 24 25 23   --   GLUCOSE 76 103* 96  --   BUN 9 13 9   --   CREATININE 0.92 0.86 0.76  --   CALCIUM 9.6 9.1 9.0  --   MG  --   --  2.1  --   PROT 7.5 7.7  --   --   ALBUMIN 4.2 3.9  --   --   AST 29 36  --   --   ALT 31 34  --   --   ALKPHOS 94 81  --   --   BILITOT 0.3 0.3  --   --    Estimated Creatinine Clearance: 78.3 mL/min (by C-G formula based on Cr of 0.76).   No results for input(s): GLUCAP in the last 72 hours.  Plan:  Potassium WNL, no supplementation needed at this time, will recheck with AM labs.   Dequita Schleicher C 07/29/2015,9:51 AM

## 2015-07-29 NOTE — Discharge Summary (Signed)
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 Hypertension     HOSPITAL COURSE:   60 year old female with a history of essential hypertension who presented from her PCPs office with a platlet count of 3.  1. Thrombocytopenia: this is likely ITP She has no evidence of hemolytic anemia, so TTP has been ruled out. She underwent bone marrow biopsy and will follow up with Dr Grayland Ormond for results. She needs to continue with high dose prednisone for now.  Platlets are stable at 31 this am. She received Platlets on admission.  2. Essential hypertension: Continue Norvasc and HCTZ at discharge.  3. Hypokalemia: Resolved 4. Sinusitis: Patient was prescribed Augmentin by her PCP on 4/3. She needs treatment for 10 days total   DISCHARGE CONDITIONS AND DIET:  Stable Regular diet  CONSULTS OBTAINED:  Treatment Team:  Lloyd Huger, MD  DRUG ALLERGIES:  No Known Allergies  DISCHARGE MEDICATIONS:   Current Discharge Medication List    START taking these medications   Details  predniSONE (DELTASONE) 20 MG tablet Take 4 tablets (80 mg total) by mouth daily with breakfast. Qty: 30 tablet, Refills: 0      CONTINUE these medications which have NOT CHANGED   Details  acetaminophen (TYLENOL) 500 MG tablet Take 500 mg by mouth every 6 (six) hours as needed.    albuterol (PROVENTIL HFA;VENTOLIN HFA) 108 (90 BASE) MCG/ACT inhaler Inhale 2 puffs into the lungs every 6 (six) hours as needed  for wheezing or shortness of breath. Qty: 1 Inhaler, Refills: 3   Associated Diagnoses: URI, acute    amLODipine (NORVASC) 10 MG tablet Take 1 tablet (10 mg total) by mouth daily. Qty: 30 tablet, Refills: 6   Associated Diagnoses: Essential hypertension    amoxicillin-clavulanate (AUGMENTIN) 875-125 MG tablet Take 1 tablet by mouth 2 (two) times daily. Qty: 20 tablet, Refills: 0   Associated Diagnoses: Bronchitis    clonazePAM (KLONOPIN) 1 MG tablet Take 1-2 mg by mouth 3 (three) times daily as needed.     cyclobenzaprine (FLEXERIL) 10 MG tablet Take 1 tablet (10 mg total) by mouth 3 (three) times daily as needed for muscle spasms. Qty: 30 tablet, Refills: 1   Associated Diagnoses: Chronic pain of right upper extremity    hydrochlorothiazide (HYDRODIURIL) 25 MG tablet Take 25 mg by mouth daily.    pregabalin (LYRICA) 75 MG capsule Take 1 capsule (75 mg total) by mouth 3 (three) times daily. Qty: 90 capsule, Refills: 5   Associated Diagnoses: Chronic leg pain, right              Today   CHIEF COMPLAINT:  Doing well this am No issues overnight No bleeding   VITAL SIGNS:  Blood pressure 158/89, pulse 60, temperature 98.2 F (36.8 C), temperature source Oral, resp. rate 18, height _0  (1.6 m), weight 81.194 kg (179 lb), SpO2 99 %.   REVIEW OF SYSTEMS:  Review of Systems  Constitutional: Negative for fever, chills and malaise/fatigue.  HENT: Negative for ear discharge,  ear pain, hearing loss, nosebleeds and sore throat.   Eyes: Negative for blurred vision and pain.  Respiratory: Negative for cough, hemoptysis, shortness of breath and wheezing.   Cardiovascular: Negative for chest pain, palpitations and leg swelling.  Gastrointestinal: Negative for nausea, vomiting, abdominal pain, diarrhea and blood in stool.  Genitourinary: Negative for dysuria.  Musculoskeletal: Negative for back pain.  Neurological: Negative for dizziness, tremors, speech change, focal  weakness, seizures and headaches.  Endo/Heme/Allergies: Bruises/bleeds easily.  Psychiatric/Behavioral: Negative for depression, suicidal ideas and hallucinations.     PHYSICAL EXAMINATION:  GENERAL:  60 y.o.-year-old patient lying in the bed with no acute distress.  NECK:  Supple, no jugular venous distention. No thyroid enlargement, no tenderness.  LUNGS: Normal breath sounds bilaterally, no wheezing, rales,rhonchi  No use of accessory muscles of respiration.  CARDIOVASCULAR: S1, S2 normal. No murmurs, rubs, or gallops.  ABDOMEN: Soft, non-tender, non-distended. Bowel sounds present. No organomegaly or mass.  EXTREMITIES: No pedal edema, cyanosis, or clubbing.  PSYCHIATRIC: The patient is alert and oriented x 3.  SKIN: No obvious rash, lesion, or ulcer. Some brusing on arms  DATA REVIEW:   CBC  Recent Labs Lab 07/29/15 0435  WBC 10.6  HGB 13.2  HCT 38.5  PLT 32*    Chemistries   Recent Labs Lab 07/27/15 0515 07/28/15 0515 07/29/15 0435  NA 138 137  --   K 3.2* 3.3* 4.2  CL 108 110  --   CO2 25 23  --   GLUCOSE 103* 96  --   BUN 13 9  --   CREATININE 0.86 0.76  --   CALCIUM 9.1 9.0  --   MG  --  2.1  --   AST 36  --   --   ALT 34  --   --   ALKPHOS 81  --   --   BILITOT 0.3  --   --     Cardiac Enzymes No results for input(s): TROPONINI in the last 168 hours.  Microbiology Results  _0 @  RADIOLOGY:  Ct Biopsy  07/28/2015  INDICATION: Thrombocytopenia of uncertain etiology. Please perform CT-guided bone marrow biopsy for tissue diagnostic purposes. EXAM: CT-GUIDED BONE MARROW BIOPSY AND ASPIRATION MEDICATIONS: None ANESTHESIA/SEDATION: Fentanyl 75 mcg IV Sedation Time: 9 minutes; The patient was continuously monitored during the procedure by the interventional radiology nurse under my direct supervision. COMPLICATIONS: None immediate. PROCEDURE: Informed consent was obtained from the patient following an explanation of the procedure, risks, benefits  and alternatives. The patient understands, agrees and consents for the procedure. All questions were addressed. A time out was performed prior to the initiation of the procedure. The patient was positioned prone and non-contrast localization CT was performed of the pelvis to demonstrate the iliac marrow spaces. The operative site was prepped and draped in the usual sterile fashion. Under sterile conditions and local anesthesia, a 22 gauge spinal needle was utilized for procedural planning. Next, an 11 gauge coaxial bone biopsy needle was advanced into the left iliac marrow space. Needle position was confirmed with CT imaging. Initially, bone marrow aspiration was performed. Next, a bone marrow biopsy was obtained with the 11 gauge outer bone marrow device. Samples were prepared with the cytotechnologist and deemed adequate. The needle was removed intact. Hemostasis was obtained with compression and a dressing was placed. The patient tolerated the procedure well without immediate post procedural complication. IMPRESSION: Successful CT guided left iliac bone marrow aspiration and core biopsy. Electronically Signed   By: Jenny Reichmann  Watts M.D.   On: 07/28/2015 11:54      Management plans discussed with the patient and she is in agreement. Stable for discharge home  Patient should follow up with Dr Grayland Ormond  CODE STATUS:     Code Status Orders        Start     Ordered   07/27/15 0937  Full code   Continuous     07/27/15 0937    Code Status History    Date Active Date Inactive Code Status Order ID Comments User Context   This patient has a current code status but no historical code status.    Advance Directive Documentation        Most Recent Value   Type of Advance Directive  Healthcare Power of Attorney, Living will   Pre-existing out of facility DNR order (yellow form or pink MOST form)     "MOST" Form in Place?        TOTAL TIME TAKING CARE OF THIS PATIENT: 36 minutes.    Note: This  dictation was prepared with Dragon dictation along with smaller phrase technology. Any transcriptional errors that result from this process are unintentional.  Riggins Cisek M.D on 07/29/2015 at 11:12 AM  Between 7am to 6pm - Pager - [phone] After 6pm go to [email or web address] - password [email or web address] [facility]  Office  [phone]  CC: Primary care physician; Dicky Doe, MD

## 2015-07-29 NOTE — Progress Notes (Signed)
Pt being discharged home, discharge instructions reviewed with pt, states understanding, pt with no noted complaints at discharge, no distress or discomfort noted

## 2015-07-30 ENCOUNTER — Inpatient Hospital Stay: Payer: Commercial Managed Care - HMO | Attending: Oncology

## 2015-07-30 ENCOUNTER — Other Ambulatory Visit: Payer: Commercial Managed Care - HMO

## 2015-07-30 ENCOUNTER — Telehealth: Payer: Self-pay | Admitting: Family Medicine

## 2015-07-30 ENCOUNTER — Ambulatory Visit: Payer: Commercial Managed Care - HMO

## 2015-07-30 ENCOUNTER — Inpatient Hospital Stay: Payer: Commercial Managed Care - HMO

## 2015-07-30 ENCOUNTER — Other Ambulatory Visit: Payer: Self-pay | Admitting: Oncology

## 2015-07-30 DIAGNOSIS — D693 Immune thrombocytopenic purpura: Secondary | ICD-10-CM | POA: Diagnosis not present

## 2015-07-30 DIAGNOSIS — Z79899 Other long term (current) drug therapy: Secondary | ICD-10-CM | POA: Insufficient documentation

## 2015-07-30 DIAGNOSIS — D696 Thrombocytopenia, unspecified: Secondary | ICD-10-CM

## 2015-07-30 DIAGNOSIS — F17210 Nicotine dependence, cigarettes, uncomplicated: Secondary | ICD-10-CM | POA: Diagnosis not present

## 2015-07-30 DIAGNOSIS — I10 Essential (primary) hypertension: Secondary | ICD-10-CM | POA: Insufficient documentation

## 2015-07-30 DIAGNOSIS — R0602 Shortness of breath: Secondary | ICD-10-CM | POA: Diagnosis not present

## 2015-07-30 LAB — CBC WITH DIFFERENTIAL/PLATELET
Basophils Absolute: 0.1 10*3/uL (ref 0–0.1)
Basophils Relative: 1 %
EOS ABS: 0 10*3/uL (ref 0–0.7)
Eosinophils Relative: 0 %
HEMATOCRIT: 43.9 % (ref 35.0–47.0)
HEMOGLOBIN: 15.1 g/dL (ref 12.0–16.0)
LYMPHS ABS: 4.3 10*3/uL — AB (ref 1.0–3.6)
LYMPHS PCT: 39 %
MCH: 28.9 pg (ref 26.0–34.0)
MCHC: 34.3 g/dL (ref 32.0–36.0)
MCV: 84.2 fL (ref 80.0–100.0)
Monocytes Absolute: 0.6 10*3/uL (ref 0.2–0.9)
Monocytes Relative: 5 %
NEUTROS ABS: 6.1 10*3/uL (ref 1.4–6.5)
NEUTROS PCT: 55 %
Platelets: 54 10*3/uL — ABNORMAL LOW (ref 150–440)
RBC: 5.22 MIL/uL — AB (ref 3.80–5.20)
RDW: 14.8 % — ABNORMAL HIGH (ref 11.5–14.5)
WBC: 11.1 10*3/uL — AB (ref 3.6–11.0)

## 2015-07-30 LAB — DIRECT PLATELET ANTIBODY
PLT ASSOC. ANTI-IIB/IIIA: POSITIVE — AB
Plt Assoc. Anti-IA/IIA: POSITIVE — AB
Plt Assoc. Anti-IB/IX: POSITIVE — AB

## 2015-07-30 NOTE — Telephone Encounter (Signed)
Margreta Journey from [employer] needs a [facility] authorization for pt's visit on 4/10 to see Dr. Grayland Ormond for D69.6   Her call back number is [phone]

## 2015-07-30 NOTE — Telephone Encounter (Signed)
Authorization entered and approved.[place]

## 2015-08-02 ENCOUNTER — Inpatient Hospital Stay: Payer: Commercial Managed Care - HMO

## 2015-08-02 ENCOUNTER — Inpatient Hospital Stay (HOSPITAL_BASED_OUTPATIENT_CLINIC_OR_DEPARTMENT_OTHER): Payer: Commercial Managed Care - HMO | Admitting: Oncology

## 2015-08-02 VITALS — BP 139/94 | HR 67 | Temp 97.4°F | Ht 59.65 in | Wt 176.4 lb

## 2015-08-02 DIAGNOSIS — D696 Thrombocytopenia, unspecified: Secondary | ICD-10-CM

## 2015-08-02 DIAGNOSIS — D693 Immune thrombocytopenic purpura: Secondary | ICD-10-CM | POA: Insufficient documentation

## 2015-08-02 DIAGNOSIS — F17210 Nicotine dependence, cigarettes, uncomplicated: Secondary | ICD-10-CM | POA: Diagnosis not present

## 2015-08-02 DIAGNOSIS — I10 Essential (primary) hypertension: Secondary | ICD-10-CM | POA: Diagnosis not present

## 2015-08-02 DIAGNOSIS — Z79899 Other long term (current) drug therapy: Secondary | ICD-10-CM | POA: Diagnosis not present

## 2015-08-02 DIAGNOSIS — R0602 Shortness of breath: Secondary | ICD-10-CM

## 2015-08-02 LAB — CBC WITH DIFFERENTIAL/PLATELET
BASOS PCT: 0 %
Basophils Absolute: 0 10*3/uL (ref 0–0.1)
EOS ABS: 0 10*3/uL (ref 0–0.7)
EOS PCT: 0 %
HCT: 46.6 % (ref 35.0–47.0)
Hemoglobin: 16 g/dL (ref 12.0–16.0)
Lymphocytes Relative: 19 %
Lymphs Abs: 2.7 10*3/uL (ref 1.0–3.6)
MCH: 28.8 pg (ref 26.0–34.0)
MCHC: 34.3 g/dL (ref 32.0–36.0)
MCV: 84 fL (ref 80.0–100.0)
MONO ABS: 0.7 10*3/uL (ref 0.2–0.9)
MONOS PCT: 5 %
NEUTROS PCT: 76 %
Neutro Abs: 10.6 10*3/uL — ABNORMAL HIGH (ref 1.4–6.5)
PLATELETS: 50 10*3/uL — AB (ref 150–440)
RBC: 5.54 MIL/uL — ABNORMAL HIGH (ref 3.80–5.20)
RDW: 14.9 % — AB (ref 11.5–14.5)
WBC: 14.1 10*3/uL — ABNORMAL HIGH (ref 3.6–11.0)

## 2015-08-02 LAB — SAMPLE TO BLOOD BANK

## 2015-08-02 NOTE — Progress Notes (Signed)
[place]  Telephone:(336) [phone] Fax:(336) [phone]  ID: Nikyah Lackman Arakawa OB: [birth year]  MR#: [id number]  EBR#:[id number]  Patient Care Team: Arlis Porta., MD as PCP - General (Family Medicine)  CHIEF COMPLAINT:  Chief Complaint  Patient presents with  . New Evaluation    Thrombocytopenia    INTERVAL HISTORY: Patient returns to clinic today for repeat laboratory work and hospital follow-up. She currently feels well and is at her baseline. She does not complain of easy bleeding or bruising.  She denies any recent fevers or illnesses. She has no new medications. She denies any night sweats or weight loss. She has no neurologic complaints. She denies any chest pain or shortness of breath. She has no nausea, vomiting, constipation, or diarrhea. She has no urinary complaints. Patient offers no specific complaints today.  REVIEW OF SYSTEMS:   Review of Systems  Constitutional: Negative.  Negative for fever and malaise/fatigue.  Respiratory: Positive for shortness of breath.   Cardiovascular: Negative.  Negative for chest pain.  Gastrointestinal: Negative.  Negative for blood in stool and melena.  Genitourinary: Negative.  Negative for hematuria.  Neurological: Negative.  Negative for weakness.  Endo/Heme/Allergies: Does not bruise/bleed easily.  Psychiatric/Behavioral: Negative.     As per HPI. Otherwise, a complete review of systems is negatve.  PAST MEDICAL HISTORY: Past Medical History  Diagnosis Date  . Hypertension     PAST SURGICAL HISTORY: Past Surgical History  Procedure Laterality Date  . Cesarean section    . Cyst removed      breast, benign  . Abdominal hysterectomy      FAMILY HISTORY: Reviewed and unchanged. No reported history of malignancy or chronic disease.     ADVANCED DIRECTIVES:    HEALTH MAINTENANCE: Social History  Substance Use Topics  . Smoking status: Current Some Day Smoker  . Smokeless tobacco: Never Used   . Alcohol Use: No     Colonoscopy:  PAP:  Bone density:  Lipid panel:  No Known Allergies  Current Outpatient Prescriptions  Medication Sig Dispense Refill  . acetaminophen (TYLENOL) 500 MG tablet Take 500 mg by mouth every 6 (six) hours as needed.    Marland Kitchen albuterol (PROVENTIL HFA;VENTOLIN HFA) 108 (90 BASE) MCG/ACT inhaler Inhale 2 puffs into the lungs every 6 (six) hours as needed for wheezing or shortness of breath. 1 Inhaler 3  . amLODipine (NORVASC) 10 MG tablet Take 1 tablet (10 mg total) by mouth daily. 30 tablet 6  . amoxicillin-clavulanate (AUGMENTIN) 875-125 MG tablet Take 1 tablet by mouth 2 (two) times daily. 20 tablet 0  . clonazePAM (KLONOPIN) 1 MG tablet Take 1-2 mg by mouth 3 (three) times daily as needed.     . cyclobenzaprine (FLEXERIL) 10 MG tablet Take 1 tablet (10 mg total) by mouth 3 (three) times daily as needed for muscle spasms. 30 tablet 1  . hydrochlorothiazide (HYDRODIURIL) 25 MG tablet Take 25 mg by mouth daily.    . predniSONE (DELTASONE) 20 MG tablet Take 4 tablets (80 mg total) by mouth daily with breakfast. 30 tablet 0  . pregabalin (LYRICA) 75 MG capsule Take 1 capsule (75 mg total) by mouth 3 (three) times daily. 90 capsule 5   No current facility-administered medications for this visit.    OBJECTIVE: Filed Vitals:   08/02/15 0920  BP: 139/94  Pulse: 67  Temp: 97.4 F (36.3 C)     Body mass index is 34.85 kg/(m^2).    ECOG FS:0 - Asymptomatic  General: Well-developed, well-nourished, no acute distress. Eyes: Pink conjunctiva, anicteric sclera. HEENT: Normocephalic, moist mucous membranes, clear oropharnyx. Lungs: Clear to auscultation bilaterally. Heart: Regular rate and rhythm. No rubs, murmurs, or gallops. Abdomen: Soft, nontender, nondistended. No organomegaly noted, normoactive bowel sounds. Musculoskeletal: No edema, cyanosis, or clubbing. Neuro: Alert, answering all questions appropriately. Cranial nerves grossly intact. Skin: No  rashes or petechiae noted. Psych: Normal affect. Lymphatics: No cervical, calvicular, axillary or inguinal LAD.   LAB RESULTS:  Lab Results  Component Value Date   NA 137 07/28/2015   K 4.2 07/29/2015   CL 110 07/28/2015   CO2 23 07/28/2015   GLUCOSE 96 07/28/2015   BUN 9 07/28/2015   CREATININE 0.76 07/28/2015   CALCIUM 9.0 07/28/2015   PROT 7.7 07/27/2015   ALBUMIN 3.9 07/27/2015   AST 36 07/27/2015   ALT 34 07/27/2015   ALKPHOS 81 07/27/2015   BILITOT 0.3 07/27/2015   GFRNONAA >60 07/28/2015   GFRAA >60 07/28/2015    Lab Results  Component Value Date   WBC 14.1* 08/02/2015   NEUTROABS 10.6* 08/02/2015   HGB 16.0 08/02/2015   HCT 46.6 08/02/2015   MCV 84.0 08/02/2015   PLT 50* 08/02/2015     STUDIES: Ct Biopsy  07/28/2015  INDICATION: Thrombocytopenia of uncertain etiology. Please perform CT-guided bone marrow biopsy for tissue diagnostic purposes. EXAM: CT-GUIDED BONE MARROW BIOPSY AND ASPIRATION MEDICATIONS: None ANESTHESIA/SEDATION: Fentanyl 75 mcg IV Sedation Time: 9 minutes; The patient was continuously monitored during the procedure by the interventional radiology nurse under my direct supervision. COMPLICATIONS: None immediate. PROCEDURE: Informed consent was obtained from the patient following an explanation of the procedure, risks, benefits and alternatives. The patient understands, agrees and consents for the procedure. All questions were addressed. A time out was performed prior to the initiation of the procedure. The patient was positioned prone and non-contrast localization CT was performed of the pelvis to demonstrate the iliac marrow spaces. The operative site was prepped and draped in the usual sterile fashion. Under sterile conditions and local anesthesia, a 22 gauge spinal needle was utilized for procedural planning. Next, an 11 gauge coaxial bone biopsy needle was advanced into the left iliac marrow space. Needle position was confirmed with CT imaging.  Initially, bone marrow aspiration was performed. Next, a bone marrow biopsy was obtained with the 11 gauge outer bone marrow device. Samples were prepared with the cytotechnologist and deemed adequate. The needle was removed intact. Hemostasis was obtained with compression and a dressing was placed. The patient tolerated the procedure well without immediate post procedural complication. IMPRESSION: Successful CT guided left iliac bone marrow aspiration and core biopsy. Electronically Signed   By: Sandi Mariscal M.D.   On: 07/28/2015 11:54    ASSESSMENT: Bone marrow biopsy proven ITP.  PLAN:    1. ITP: Bone marrow biopsy performed last week was reported as normal, therefore confirming a diagnosis of ITP. Patient had a minimal response to prednisone increasing her platelet count into the 50s, but not as robust as hoped. She has been instructed to decrease her prednisone dose to 40 mg daily and maintain this dose until she returns to clinic in 1 week to initiate cycle 1 of 4 of weekly Rituxan. We will continue to monitor CBC weekly and taper prednisone once Rituxan starts.  Approximatlye 30 minutes spent in discussion of which greater than 50% was consultation.  Patient expressed understanding and was in agreement with this plan. She also understands that She can call clinic at  any time with any questions, concerns, or complaints.   Lloyd Huger, MD   08/02/2015 9:49 AM

## 2015-08-03 ENCOUNTER — Encounter: Payer: Self-pay | Admitting: Family Medicine

## 2015-08-03 ENCOUNTER — Ambulatory Visit (INDEPENDENT_AMBULATORY_CARE_PROVIDER_SITE_OTHER): Payer: Commercial Managed Care - HMO | Admitting: Family Medicine

## 2015-08-03 VITALS — BP 124/77 | HR 83 | Temp 98.6°F | Resp 16 | Ht 63.5 in | Wt 176.8 lb

## 2015-08-03 DIAGNOSIS — I10 Essential (primary) hypertension: Secondary | ICD-10-CM | POA: Diagnosis not present

## 2015-08-03 DIAGNOSIS — D693 Immune thrombocytopenic purpura: Secondary | ICD-10-CM | POA: Diagnosis not present

## 2015-08-03 DIAGNOSIS — E876 Hypokalemia: Secondary | ICD-10-CM | POA: Diagnosis not present

## 2015-08-03 DIAGNOSIS — D696 Thrombocytopenia, unspecified: Secondary | ICD-10-CM | POA: Diagnosis not present

## 2015-08-03 NOTE — Progress Notes (Signed)
Name: Erica Bartlett   MRN: [id number]    DOB: [birth year]   Date:08/03/2015       Progress Note  Subjective  Chief Complaint  Chief Complaint  Patient presents with  . 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 Count of 3!.  She is being treated by Hematology with Prednisone.  C/o some extra cough to day.  Still on Augmentin from sinus infection last week.  She is starting a specific treatment plan for ITP next week.  No problem-specific assessment & plan notes found for this encounter.   Past Medical History  Diagnosis Date  . Hypertension     Past Surgical History  Procedure Laterality Date  . Cesarean section    . Cyst removed      breast, benign  . Abdominal hysterectomy      History reviewed. No pertinent family history.  Social History   Social History  . Marital Status: Widowed    Spouse Name: N/A  . Number of Children: N/A  . Years of Education: N/A   Occupational History  . Not on file.   Social History Main Topics  . Smoking status: Current Some Day Smoker  . Smokeless tobacco: Never Used  . Alcohol Use: No  . Drug Use: No  . Sexual Activity: Yes    Birth Control/ Protection: Post-menopausal   Other Topics Concern  . Not on file   Social History Narrative     Current outpatient prescriptions:  .  acetaminophen (TYLENOL) 500 MG tablet, Take 500 mg by mouth every 6 (six) hours as needed., Disp: , Rfl:  .  albuterol (PROVENTIL HFA;VENTOLIN HFA) 108 (90 BASE) MCG/ACT inhaler, Inhale 2 puffs into the lungs every 6 (six) hours as needed for wheezing or shortness of breath., Disp: 1 Inhaler, Rfl: 3 .  amLODipine (NORVASC) 10 MG tablet, Take 1 tablet (10 mg total) by mouth daily., Disp: 30 tablet, Rfl: 6 .  amoxicillin-clavulanate (AUGMENTIN) 875-125 MG tablet, Take 1 tablet by mouth 2 (two) times daily., Disp: 20 tablet, Rfl: 0 .  clonazePAM (KLONOPIN) 1 MG tablet, Take 1-2 mg by  mouth 3 (three) times daily as needed. , Disp: , Rfl:  .  cyclobenzaprine (FLEXERIL) 10 MG tablet, Take 1 tablet (10 mg total) by mouth 3 (three) times daily as needed for muscle spasms., Disp: 30 tablet, Rfl: 1 .  hydrochlorothiazide (HYDRODIURIL) 25 MG tablet, Take 25 mg by mouth daily., Disp: , Rfl:  .  predniSONE (DELTASONE) 20 MG tablet, Take 4 tablets (80 mg total) by mouth daily with breakfast., Disp: 30 tablet, Rfl: 0 .  pregabalin (LYRICA) 75 MG capsule, Take 1 capsule (75 mg total) by mouth 3 (three) times daily., Disp: 90 capsule, Rfl: 5  No Known Allergies   Review of Systems  Constitutional: Positive for malaise/fatigue. Negative for fever, chills and weight loss.  HENT: Negative for hearing loss.   Eyes: Negative for blurred vision and double vision.  Respiratory: Positive for cough and sputum production. Negative for shortness of breath and wheezing.   Cardiovascular: Negative for chest pain, palpitations and leg swelling.  Gastrointestinal: Negative for heartburn, abdominal pain and blood in stool.  Genitourinary: Negative for dysuria, urgency and frequency.  Musculoskeletal: Negative for myalgias.  Skin:       diffuse bruising, but much improved  Neurological: Negative for dizziness, tremors, weakness and headaches.      Objective  Filed Vitals:   08/03/15 1414  BP: 124/77  Pulse: 83  Temp: 98.6 F (37 C)  TempSrc: Oral  Resp: 16  Height: 5' 3.5" (1.613 m)  Weight: 176 lb 12.8 oz (80.196 kg)  SpO2: 99%    Physical Exam  Constitutional: She is oriented to person, place, and time and well-developed, well-nourished, and in no distress. No distress.  HENT:  Head: Normocephalic and atraumatic.  Eyes: Conjunctivae and EOM are normal. Pupils are equal, round, and reactive to light. No scleral icterus.  Neck: Normal range of motion. Neck supple. No thyromegaly present.  Cardiovascular: Normal rate, regular rhythm and normal heart sounds.  Exam reveals no gallop  and no friction rub.   No murmur heard. Pulmonary/Chest: Effort normal and breath sounds normal. No respiratory distress. She has no wheezes. She has no rales.  Abdominal: Soft. Bowel sounds are normal. She exhibits no distension. There is no tenderness. There is no guarding.  Lymphadenopathy:    She has no cervical adenopathy.  Neurological: She is alert and oriented to person, place, and time.  Skin:  Some extremity bruising, but much improved.  Vitals reviewed.      Recent Results (from the past 2160 hour(s))  CBC with Differential     Status: Abnormal   Collection Time: 07/26/15  1:58 PM  Result Value Ref Range   WBC 5.1 3.4 - 10.8 x10E3/uL   RBC 5.01 3.77 - 5.28 x10E6/uL   Hemoglobin 14.5 11.1 - 15.9 g/dL   Hematocrit 42.6 34.0 - 46.6 %   MCV 85 79 - 97 fL   MCH 28.9 26.6 - 33.0 pg   MCHC 34.0 31.5 - 35.7 g/dL   RDW 15.3 12.3 - 15.4 %   Platelets 3 (<) 150 - 379 x10E3/uL    Comment: Platelet count verified by examination of peripheral blood smear.   Neutrophils 50 %   Lymphs 33 %   Monocytes 7 %   Eos 8 %   Basos 1 %   Neutrophils Absolute 2.6 1.4 - 7.0 x10E3/uL   Lymphocytes Absolute 1.7 0.7 - 3.1 x10E3/uL   Monocytes Absolute 0.3 0.1 - 0.9 x10E3/uL   EOS (ABSOLUTE) 0.4 0.0 - 0.4 x10E3/uL   Basophils Absolute 0.1 0.0 - 0.2 x10E3/uL   Immature Granulocytes 1 %   Immature Grans (Abs) 0.0 0.0 - 0.1 x10E3/uL   Hematology Comments: Note:     Comment: Verified by microscopic examination.  Comprehensive Metabolic Panel (CMET)     Status: None   Collection Time: 07/26/15  1:58 PM  Result Value Ref Range   Glucose 76 65 - 99 mg/dL   BUN 9 6 - 24 mg/dL   Creatinine, Ser 0.92 0.57 - 1.00 mg/dL   GFR calc non Af Amer 69 >59 mL/min/1.73   GFR calc Af Amer 80 >59 mL/min/1.73   BUN/Creatinine Ratio 10 9 - 23    Comment:               **Please note reference interval change**   Sodium 142 134 - 144 mmol/L   Potassium 4.0 3.5 - 5.2 mmol/L   Chloride 103 96 - 106 mmol/L    CO2 24 18 - 29 mmol/L   Calcium 9.6 8.7 - 10.2 mg/dL   Total Protein 7.5 6.0 - 8.5 g/dL   Albumin 4.2 3.5 - 5.5 g/dL   Globulin, Total 3.3 1.5 - 4.5 g/dL   Albumin/Globulin Ratio 1.3 1.2 - 2.2    Comment:               **  Please note reference interval change**   Bilirubin Total 0.3 0.0 - 1.2 mg/dL   Alkaline Phosphatase 94 39 - 117 IU/L   AST 29 0 - 40 IU/L   ALT 31 0 - 32 IU/L  CBC     Status: Abnormal   Collection Time: 07/27/15  5:15 AM  Result Value Ref Range   WBC 5.4 3.6 - 11.0 K/uL   RBC 4.96 3.80 - 5.20 MIL/uL   Hemoglobin 14.1 12.0 - 16.0 g/dL   HCT 41.7 35.0 - 47.0 %   MCV 84.1 80.0 - 100.0 fL   MCH 28.4 26.0 - 34.0 pg   MCHC 33.8 32.0 - 36.0 g/dL   RDW 14.6 (H) 11.5 - 14.5 %   Platelets 11 (LL) 150 - 440 K/uL    Comment: CRITICAL RESULT CALLED TO, READ BACK BY AND VERIFIED WITH: NOEL WEBSTER AT 0600 CAF   Comprehensive metabolic panel     Status: Abnormal   Collection Time: 07/27/15  5:15 AM  Result Value Ref Range   Sodium 138 135 - 145 mmol/L   Potassium 3.2 (L) 3.5 - 5.1 mmol/L   Chloride 108 101 - 111 mmol/L   CO2 25 22 - 32 mmol/L   Glucose, Bld 103 (H) 65 - 99 mg/dL   BUN 13 6 - 20 mg/dL   Creatinine, Ser 0.86 0.44 - 1.00 mg/dL   Calcium 9.1 8.9 - 10.3 mg/dL   Total Protein 7.7 6.5 - 8.1 g/dL   Albumin 3.9 3.5 - 5.0 g/dL   AST 36 15 - 41 U/L   ALT 34 14 - 54 U/L   Alkaline Phosphatase 81 38 - 126 U/L   Total Bilirubin 0.3 0.3 - 1.2 mg/dL   GFR calc non Af Amer >60 >60 mL/min   GFR calc Af Amer >60 >60 mL/min    Comment: (NOTE) The eGFR has been calculated using the CKD EPI equation. This calculation has not been validated in all clinical situations. eGFR's persistently <60 mL/min signify possible Chronic Kidney Disease.    Anion gap 5 5 - 15  Protime-INR     Status: None   Collection Time: 07/27/15  5:15 AM  Result Value Ref Range   Prothrombin Time 12.5 11.4 - 15.0 seconds   INR 0.91   APTT     Status: None   Collection Time: 07/27/15  5:15  AM  Result Value Ref Range   aPTT 28 24 - 36 seconds  Rapid HIV screen (HIV 1/2 Ab+Ag)     Status: None   Collection Time: 07/27/15  5:15 AM  Result Value Ref Range   HIV-1 P24 Antigen - HIV24 NON REACTIVE NON REACTIVE   HIV 1/2 Antibodies NON REACTIVE NON REACTIVE   Interpretation (HIV Ag Ab)      A non reactive test result means that HIV 1 or HIV 2 antibodies and HIV 1 p24 antigen were not detected in the specimen.  Fibrinogen     Status: None   Collection Time: 07/27/15  5:15 AM  Result Value Ref Range   Fibrinogen 402 210 - 470 mg/dL  Lactate dehydrogenase     Status: Abnormal   Collection Time: 07/27/15  5:15 AM  Result Value Ref Range   LDH 209 (H) 98 - 192 U/L  Differential     Status: None   Collection Time: 07/27/15  5:15 AM  Result Value Ref Range   Neutrophils Relative % 43% %   Neutro Abs 2.4 1.4 - 6.5  K/uL   Lymphocytes Relative 38% %   Lymphs Abs 2.0 1.0 - 3.6 K/uL   Monocytes Relative 8% %   Monocytes Absolute 0.4 0.2 - 0.9 K/uL   Eosinophils Relative 10% %   Eosinophils Absolute 0.5 0 - 0.7 K/uL   Basophils Relative 1% %   Basophils Absolute 0.0 0 - 0.1 K/uL  Pathologist smear review     Status: None   Collection Time: 07/27/15  5:15 AM  Result Value Ref Range   Path Review Peripheral smear is adequate for review.     Comment: RBC with anisocytosis and rouleaux. WBC unremarkable. Marked thrombocytopenia with variation in size. Giant platelets present. RBC platelet overlap. Negative for blasts. Discussed with Dr. Grayland Ormond on 07/27/2015 at 8:35 AM. Reviewed by Dellia Nims. Reuel Derby, M.D.   ABO/Rh     Status: None   Collection Time: 07/27/15  5:15 AM  Result Value Ref Range   ABO/RH(D) A POS   Urinalysis complete, with microscopic ([facility] only)     Status: Abnormal   Collection Time: 07/27/15  5:23 AM  Result Value Ref Range   Color, Urine YELLOW (A) YELLOW   APPearance CLEAR (A) CLEAR   Glucose, UA NEGATIVE NEGATIVE mg/dL   Bilirubin Urine NEGATIVE NEGATIVE    Ketones, ur NEGATIVE NEGATIVE mg/dL   Specific Gravity, Urine 1.025 1.005 - 1.030   Hgb urine dipstick 2+ (A) NEGATIVE   pH 6.0 5.0 - 8.0   Protein, ur 30 (A) NEGATIVE mg/dL   Nitrite NEGATIVE NEGATIVE   Leukocytes, UA TRACE (A) NEGATIVE   RBC / HPF 6-30 0 - 5 RBC/hpf   WBC, UA 0-5 0 - 5 WBC/hpf   Bacteria, UA NONE SEEN NONE SEEN   Squamous Epithelial / LPF 0-5 (A) NONE SEEN   Mucous PRESENT   Prepare Pheresed Platelets     Status: None   Collection Time: 07/27/15  7:23 AM  Result Value Ref Range   Unit Number [id number]    Blood Component Type PLTP LR3 PAS    Unit division 00    Status of Unit ISSUED,FINAL    Transfusion Status OK TO TRANSFUSE   DIRECT PLATELET ANTIBODY     Status: Abnormal   Collection Time: 07/27/15  8:26 AM  Result Value Ref Range   Plt Assoc. Anti-IIB/IIIA Positive (A)     Comment: (NOTE) Glycoprotein IIb/IIIa screens for antibodies to HPA-1a, HPA-1b, HPA-3a, HPA-3b, HPA-4a and HPA-4b. The presence of immune complexes or other immunoglobulin aggregates in the patient sample may cause increased non-specific binding and produce false-positive results in either the direct and indirect platelet antibody assays. Reference Range: Negative    Plt Assoc. Anti-IB/IX Positive (A)     Comment: (NOTE) Glycoprotein Ib/IX screens for antibodies to HPA-2a and HPA-2b. The presence of immune complexes or other immunoglobulin aggregates in the patient sample may cause increased non-specific binding and produce false-positive results in either the direct and indirect platelet antibody assays. Reference Range: Negative    Plt Assoc. Anti-IA/IIA Positive (A)     Comment: (NOTE) Glycoprotein Ia/IIa screens for antibodies to HPA-5a and HPA-5b. The presence of immune complexes or other immunoglobulin aggregates in the patient sample may cause increased non-specific binding and produce false-positive results in either the direct and indirect platelet antibody  assays. Reference Range: Negative Performed At: [facility] [address] [place], [place] [id number] Melida Quitter MD PQ:[id number]   CBC with Differential/Platelet     Status: Abnormal   Collection  Time: 07/27/15  6:40 PM  Result Value Ref Range   WBC 6.6 3.6 - 11.0 K/uL   RBC 5.01 3.80 - 5.20 MIL/uL   Hemoglobin 14.5 12.0 - 16.0 g/dL   HCT 42.1 35.0 - 47.0 %   MCV 84.1 80.0 - 100.0 fL   MCH 28.9 26.0 - 34.0 pg   MCHC 34.4 32.0 - 36.0 g/dL   RDW 14.8 (H) 11.5 - 14.5 %   Platelets 61 (L) 150 - 440 K/uL   Neutrophils Relative % 84% %   Neutro Abs 5.5 1.4 - 6.5 K/uL   Lymphocytes Relative 15% %   Lymphs Abs 1.0 1.0 - 3.6 K/uL   Monocytes Relative 1% %   Monocytes Absolute 0.1 (L) 0.2 - 0.9 K/uL   Eosinophils Relative 0% %   Eosinophils Absolute 0.0 0 - 0.7 K/uL   Basophils Relative 0% %   Basophils Absolute 0.0 0 - 0.1 K/uL  Basic metabolic panel     Status: Abnormal   Collection Time: 07/28/15  5:15 AM  Result Value Ref Range   Sodium 137 135 - 145 mmol/L   Potassium 3.3 (L) 3.5 - 5.1 mmol/L   Chloride 110 101 - 111 mmol/L   CO2 23 22 - 32 mmol/L   Glucose, Bld 96 65 - 99 mg/dL   BUN 9 6 - 20 mg/dL   Creatinine, Ser 0.76 0.44 - 1.00 mg/dL   Calcium 9.0 8.9 - 10.3 mg/dL   GFR calc non Af Amer >60 >60 mL/min   GFR calc Af Amer >60 >60 mL/min    Comment: (NOTE) The eGFR has been calculated using the CKD EPI equation. This calculation has not been validated in all clinical situations. eGFR's persistently <60 mL/min signify possible Chronic Kidney Disease.    Anion gap 4 (L) 5 - 15  CBC     Status: Abnormal   Collection Time: 07/28/15  5:15 AM  Result Value Ref Range   WBC 8.5 3.6 - 11.0 K/uL   RBC 4.63 3.80 - 5.20 MIL/uL   Hemoglobin 13.3 12.0 - 16.0 g/dL   HCT 39.5 35.0 - 47.0 %   MCV 85.3 80.0 - 100.0 fL   MCH 28.7 26.0 - 34.0 pg   MCHC 33.7 32.0 - 36.0 g/dL   RDW 15.1 (H) 11.5 - 14.5 %   Platelets 33 (L) 150 - 440 K/uL  Magnesium      Status: None   Collection Time: 07/28/15  5:15 AM  Result Value Ref Range   Magnesium 2.1 1.7 - 2.4 mg/dL  CBC with Differential/Platelet     Status: Abnormal   Collection Time: 07/29/15  4:35 AM  Result Value Ref Range   WBC 10.6 3.6 - 11.0 K/uL   RBC 4.53 3.80 - 5.20 MIL/uL   Hemoglobin 13.2 12.0 - 16.0 g/dL   HCT 38.5 35.0 - 47.0 %   MCV 85.0 80.0 - 100.0 fL   MCH 29.1 26.0 - 34.0 pg   MCHC 34.2 32.0 - 36.0 g/dL   RDW 14.8 (H) 11.5 - 14.5 %   Platelets 32 (L) 150 - 440 K/uL   Neutrophils Relative % 75% %   Neutro Abs 8.0 (H) 1.4 - 6.5 K/uL   Lymphocytes Relative 19% %   Lymphs Abs 2.0 1.0 - 3.6 K/uL   Monocytes Relative 6% %   Monocytes Absolute 0.6 0.2 - 0.9 K/uL   Eosinophils Relative 0% %   Eosinophils Absolute 0.0 0 - 0.7 K/uL  Basophils Relative 0% %   Basophils Absolute 0.0 0 - 0.1 K/uL  Potassium     Status: None   Collection Time: 07/29/15  4:35 AM  Result Value Ref Range   Potassium 4.2 3.5 - 5.1 mmol/L  CBC with Differential/Platelet     Status: Abnormal   Collection Time: 07/30/15  8:43 AM  Result Value Ref Range   WBC 11.1 (H) 3.6 - 11.0 K/uL   RBC 5.22 (H) 3.80 - 5.20 MIL/uL   Hemoglobin 15.1 12.0 - 16.0 g/dL   HCT 43.9 35.0 - 47.0 %   MCV 84.2 80.0 - 100.0 fL   MCH 28.9 26.0 - 34.0 pg   MCHC 34.3 32.0 - 36.0 g/dL   RDW 14.8 (H) 11.5 - 14.5 %   Platelets 54 (L) 150 - 440 K/uL   Neutrophils Relative % 55 %   Neutro Abs 6.1 1.4 - 6.5 K/uL   Lymphocytes Relative 39 %   Lymphs Abs 4.3 (H) 1.0 - 3.6 K/uL   Monocytes Relative 5 %   Monocytes Absolute 0.6 0.2 - 0.9 K/uL   Eosinophils Relative 0 %   Eosinophils Absolute 0.0 0 - 0.7 K/uL   Basophils Relative 1 %   Basophils Absolute 0.1 0 - 0.1 K/uL  CBC with Differential/Platelet     Status: Abnormal   Collection Time: 08/02/15  8:57 AM  Result Value Ref Range   WBC 14.1 (H) 3.6 - 11.0 K/uL   RBC 5.54 (H) 3.80 - 5.20 MIL/uL   Hemoglobin 16.0 12.0 - 16.0 g/dL   HCT 46.6 35.0 - 47.0 %   MCV 84.0  80.0 - 100.0 fL   MCH 28.8 26.0 - 34.0 pg   MCHC 34.3 32.0 - 36.0 g/dL   RDW 14.9 (H) 11.5 - 14.5 %   Platelets 50 (L) 150 - 440 K/uL   Neutrophils Relative % 76 %   Neutro Abs 10.6 (H) 1.4 - 6.5 K/uL   Lymphocytes Relative 19 %   Lymphs Abs 2.7 1.0 - 3.6 K/uL   Monocytes Relative 5 %   Monocytes Absolute 0.7 0.2 - 0.9 K/uL   Eosinophils Relative 0 %   Eosinophils Absolute 0.0 0 - 0.7 K/uL   Basophils Relative 0 %   Basophils Absolute 0.0 0 - 0.1 K/uL  Sample to Blood Bank     Status: None   Collection Time: 08/02/15  8:58 AM  Result Value Ref Range   Blood Bank Specimen SAMPLE AVAILABLE FOR TESTING    Sample Expiration 08/05/2015      Assessment & Plan  Problem List Items Addressed This Visit      Cardiovascular and Mediastinum   Hypertension     Hematopoietic and Hemostatic   ITP (idiopathic thrombocytopenic purpura) - Primary     Other   Thrombocytopenia (HCC)   Hypokalemia   Relevant Orders   Basic Metabolic Panel (BMET)     1. ITP (idiopathic thrombocytopenic purpura) Cont f/u at Oncology/Hematology 2. Essential hypertension Cont. meds 3. Thrombocytopenia ([place])   4. Hypokalemia  - Basic Metabolic Panel (BMET) No orders of the defined types were placed in this encounter.

## 2015-08-04 DIAGNOSIS — E876 Hypokalemia: Secondary | ICD-10-CM | POA: Diagnosis not present

## 2015-08-05 ENCOUNTER — Other Ambulatory Visit: Payer: Self-pay | Admitting: *Deleted

## 2015-08-05 LAB — BASIC METABOLIC PANEL
BUN/Creatinine Ratio: 17 (ref 9–23)
BUN: 17 mg/dL (ref 6–24)
CHLORIDE: 95 mmol/L — AB (ref 96–106)
CO2: 21 mmol/L (ref 18–29)
Calcium: 9.3 mg/dL (ref 8.7–10.2)
Creatinine, Ser: 0.98 mg/dL (ref 0.57–1.00)
GFR calc Af Amer: 74 mL/min/{1.73_m2} (ref >59)
GFR, EST NON AFRICAN AMERICAN: 64 mL/min/{1.73_m2} (ref >59)
GLUCOSE: 91 mg/dL (ref 65–99)
POTASSIUM: 3.5 mmol/L (ref 3.5–5.2)
SODIUM: 140 mmol/L (ref 134–144)

## 2015-08-05 MED ORDER — LEVOFLOXACIN 500 MG PO TABS: 500.0000 mg | ORAL_TABLET | Freq: Every day | ORAL | Status: DC

## 2015-08-09 ENCOUNTER — Inpatient Hospital Stay: Payer: Commercial Managed Care - HMO

## 2015-08-09 ENCOUNTER — Inpatient Hospital Stay (HOSPITAL_BASED_OUTPATIENT_CLINIC_OR_DEPARTMENT_OTHER): Payer: Commercial Managed Care - HMO | Admitting: Oncology

## 2015-08-09 ENCOUNTER — Telehealth: Payer: Self-pay | Admitting: Pharmacist

## 2015-08-09 VITALS — BP 132/82 | HR 65 | Temp 98.7°F | Wt 175.5 lb

## 2015-08-09 VITALS — BP 123/77 | HR 66 | Resp 16

## 2015-08-09 DIAGNOSIS — D693 Immune thrombocytopenic purpura: Secondary | ICD-10-CM

## 2015-08-09 DIAGNOSIS — Z79899 Other long term (current) drug therapy: Secondary | ICD-10-CM | POA: Diagnosis not present

## 2015-08-09 DIAGNOSIS — I10 Essential (primary) hypertension: Secondary | ICD-10-CM | POA: Diagnosis not present

## 2015-08-09 DIAGNOSIS — D696 Thrombocytopenia, unspecified: Secondary | ICD-10-CM

## 2015-08-09 DIAGNOSIS — F17210 Nicotine dependence, cigarettes, uncomplicated: Secondary | ICD-10-CM | POA: Diagnosis not present

## 2015-08-09 DIAGNOSIS — R0602 Shortness of breath: Secondary | ICD-10-CM | POA: Diagnosis not present

## 2015-08-09 LAB — CBC WITH DIFFERENTIAL/PLATELET
Basophils Absolute: 0.1 K/uL (ref 0–0.1)
Basophils Relative: 1 %
Eosinophils Absolute: 0 K/uL (ref 0–0.7)
Eosinophils Relative: 0 %
HCT: 43.3 % (ref 35.0–47.0)
Hemoglobin: 15 g/dL (ref 12.0–16.0)
Lymphocytes Relative: 32 %
Lymphs Abs: 3.2 K/uL (ref 1.0–3.6)
MCH: 28.9 pg (ref 26.0–34.0)
MCHC: 34.6 g/dL (ref 32.0–36.0)
MCV: 83.3 fL (ref 80.0–100.0)
Monocytes Absolute: 0.8 K/uL (ref 0.2–0.9)
Monocytes Relative: 8 %
Neutro Abs: 5.8 K/uL (ref 1.4–6.5)
Neutrophils Relative %: 59 %
Platelets: 56 K/uL — ABNORMAL LOW (ref 150–440)
RBC: 5.19 MIL/uL (ref 3.80–5.20)
RDW: 14.3 % (ref 11.5–14.5)
WBC: 10 K/uL (ref 3.6–11.0)

## 2015-08-09 LAB — SAMPLE TO BLOOD BANK

## 2015-08-09 MED ORDER — ACETAMINOPHEN 325 MG PO TABS
650.0000 mg | ORAL_TABLET | Freq: Once | ORAL | Status: AC
  Administered 2015-08-09: 650 mg via ORAL
  Filled 2015-08-09: qty 2

## 2015-08-09 MED ORDER — DIPHENHYDRAMINE HCL 50 MG/ML IJ SOLN
25.0000 mg | Freq: Once | INTRAMUSCULAR | Status: AC | PRN
  Administered 2015-08-09: 25 mg via INTRAVENOUS

## 2015-08-09 MED ORDER — PREDNISONE 5 MG PO TABS: 5.0000 mg | ORAL_TABLET | Freq: Every day | ORAL | Status: DC

## 2015-08-09 MED ORDER — SODIUM CHLORIDE 0.9 % IV SOLN
10.0000 mg | Freq: Once | INTRAVENOUS | Status: AC
  Administered 2015-08-09: 10 mg via INTRAVENOUS
  Filled 2015-08-09: qty 1

## 2015-08-09 MED ORDER — SODIUM CHLORIDE 0.9 % IV SOLN
Freq: Once | INTRAVENOUS | Status: AC
  Administered 2015-08-09: 10:00:00 via INTRAVENOUS
  Filled 2015-08-09: qty 1000

## 2015-08-09 MED ORDER — SODIUM CHLORIDE 0.9 % IV SOLN
375.0000 mg/m2 | Freq: Once | INTRAVENOUS | Status: AC
  Administered 2015-08-09: 700 mg via INTRAVENOUS
  Filled 2015-08-09: qty 60

## 2015-08-09 MED ORDER — DIPHENHYDRAMINE HCL 25 MG PO CAPS
25.0000 mg | ORAL_CAPSULE | Freq: Once | ORAL | Status: AC
  Administered 2015-08-09: 25 mg via ORAL
  Filled 2015-08-09: qty 1

## 2015-08-09 NOTE — Telephone Encounter (Signed)
MD to order Hep b

## 2015-08-09 NOTE — Progress Notes (Signed)
[facility]  [phone] [phone]  ID: Erica Bartlett OB: 1955/04/28  MR#: [id number]  NLG#:[id number]  Patient Care Team: Arlis Porta., MD as PCP - General (Family Medicine)  CHIEF COMPLAINT:  Chief Complaint  Patient presents with  . ITP    INTERVAL HISTORY: Patient returns to clinic today for repeat laboratory work and initiation of cycle 1 of 4 of weekly Rituxan. She currently feels well and is at her baseline. She does not complain of easy bleeding or bruising.  She denies any recent fevers or illnesses. She has no neurologic complaints. She denies any chest pain or shortness of breath. She has no nausea, vomiting, constipation, or diarrhea. She has no urinary complaints. Patient offers no specific complaints today.  REVIEW OF SYSTEMS:   Review of Systems  Constitutional: Negative.  Negative for fever and malaise/fatigue.  Respiratory: Negative for shortness of breath.   Cardiovascular: Negative.  Negative for chest pain.  Gastrointestinal: Negative.  Negative for blood in stool and melena.  Genitourinary: Negative.  Negative for hematuria.  Neurological: Negative.  Negative for weakness.  Endo/Heme/Allergies: Does not bruise/bleed easily.  Psychiatric/Behavioral: Negative.     As per HPI. Otherwise, a complete review of systems is negatve.  PAST MEDICAL HISTORY: Past Medical History  Diagnosis Date  . Hypertension     PAST SURGICAL HISTORY: Past Surgical History  Procedure Laterality Date  . Cesarean section    . Cyst removed      breast, benign  . Abdominal hysterectomy      FAMILY HISTORY: Reviewed and unchanged. No reported history of malignancy or chronic disease.     ADVANCED DIRECTIVES:    HEALTH MAINTENANCE: Social History  Substance Use Topics  . Smoking status: Current Some Day Smoker  . Smokeless tobacco: Never Used  . Alcohol Use: No     Colonoscopy:  PAP:  Bone density:  Lipid  panel:  No Known Allergies  Current Outpatient Prescriptions  Medication Sig Dispense Refill  . acetaminophen (TYLENOL) 500 MG tablet Take 500 mg by mouth every 6 (six) hours as needed.    Marland Kitchen albuterol (PROVENTIL HFA;VENTOLIN HFA) 108 (90 BASE) MCG/ACT inhaler Inhale 2 puffs into the lungs every 6 (six) hours as needed for wheezing or shortness of breath. 1 Inhaler 3  . amLODipine (NORVASC) 10 MG tablet Take 1 tablet (10 mg total) by mouth daily. 30 tablet 6  . amoxicillin-clavulanate (AUGMENTIN) 875-125 MG tablet Take 1 tablet by mouth 2 (two) times daily. 20 tablet 0  . clonazePAM (KLONOPIN) 1 MG tablet Take 1-2 mg by mouth 3 (three) times daily as needed.     . cyclobenzaprine (FLEXERIL) 10 MG tablet Take 1 tablet (10 mg total) by mouth 3 (three) times daily as needed for muscle spasms. 30 tablet 1  . hydrochlorothiazide (HYDRODIURIL) 25 MG tablet Take 25 mg by mouth daily.    . pregabalin (LYRICA) 75 MG capsule Take 1 capsule (75 mg total) by mouth 3 (three) times daily. 90 capsule 5  . predniSONE (DELTASONE) 20 MG tablet Take 4 tablets (80 mg total) by mouth daily with breakfast. (Patient not taking: Reported on 08/09/2015) 30 tablet 0   No current facility-administered medications for this visit.   Facility-Administered Medications Ordered in Other Visits  Medication Dose Route Frequency Provider Last Rate Last Dose  . riTUXimab (RITUXAN) 700 mg in sodium chloride 0.9 % 250 mL (2.1875 mg/mL) chemo infusion  375 mg/m2 (Treatment Plan Actual) Intravenous Once [facility]  Grayland Ormond, MD        OBJECTIVE: Filed Vitals:   08/09/15 0905  BP: 132/82  Pulse: 65  Temp: 98.7 F (37.1 C)     Body mass index is 30.59 kg/(m^2).    ECOG FS:0 - Asymptomatic  General: Well-developed, well-nourished, no acute distress. Eyes: Pink conjunctiva, anicteric sclera. Lungs: Clear to auscultation bilaterally. Heart: Regular rate and rhythm. No rubs, murmurs, or gallops. Abdomen: Soft, nontender,  nondistended. No organomegaly noted, normoactive bowel sounds. Musculoskeletal: No edema, cyanosis, or clubbing. Neuro: Alert, answering all questions appropriately. Cranial nerves grossly intact. Skin: No rashes or petechiae noted. Psych: Normal affect.   LAB RESULTS:  Lab Results  Component Value Date   NA 140 08/04/2015   K 3.5 08/04/2015   CL 95* 08/04/2015   CO2 21 08/04/2015   GLUCOSE 91 08/04/2015   BUN 17 08/04/2015   CREATININE 0.98 08/04/2015   CALCIUM 9.3 08/04/2015   PROT 7.7 07/27/2015   ALBUMIN 3.9 07/27/2015   AST 36 07/27/2015   ALT 34 07/27/2015   ALKPHOS 81 07/27/2015   BILITOT 0.3 07/27/2015   GFRNONAA 64 08/04/2015   GFRAA 74 08/04/2015    Lab Results  Component Value Date   WBC 10.0 08/09/2015   NEUTROABS 5.8 08/09/2015   HGB 15.0 08/09/2015   HCT 43.3 08/09/2015   MCV 83.3 08/09/2015   PLT 56* 08/09/2015     STUDIES: Ct Biopsy  07/28/2015  INDICATION: Thrombocytopenia of uncertain etiology. Please perform CT-guided bone marrow biopsy for tissue diagnostic purposes. EXAM: CT-GUIDED BONE MARROW BIOPSY AND ASPIRATION MEDICATIONS: None ANESTHESIA/SEDATION: Fentanyl 75 mcg IV Sedation Time: 9 minutes; The patient was continuously monitored during the procedure by the interventional radiology nurse under my direct supervision. COMPLICATIONS: None immediate. PROCEDURE: Informed consent was obtained from the patient following an explanation of the procedure, risks, benefits and alternatives. The patient understands, agrees and consents for the procedure. All questions were addressed. A time out was performed prior to the initiation of the procedure. The patient was positioned prone and non-contrast localization CT was performed of the pelvis to demonstrate the iliac marrow spaces. The operative site was prepped and draped in the usual sterile fashion. Under sterile conditions and local anesthesia, a 22 gauge spinal needle was utilized for procedural planning.  Next, an 11 gauge coaxial bone biopsy needle was advanced into the left iliac marrow space. Needle position was confirmed with CT imaging. Initially, bone marrow aspiration was performed. Next, a bone marrow biopsy was obtained with the 11 gauge outer bone marrow device. Samples were prepared with the cytotechnologist and deemed adequate. The needle was removed intact. Hemostasis was obtained with compression and a dressing was placed. The patient tolerated the procedure well without immediate post procedural complication. IMPRESSION: Successful CT guided left iliac bone marrow aspiration and core biopsy. Electronically Signed   By: Sandi Mariscal M.D.   On: 07/28/2015 11:54    ASSESSMENT: Bone marrow biopsy proven ITP.  PLAN:    1. ITP: Bone marrow biopsy performed on July 28, 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 Patient had a minimal response to prednisone increasing her platelet count into the 50s, but not as robust as hoped. Will taper her prednisone off over the next 1-2 weeks. Proceed with cycle 1 of 4 of weekly Rituxan. Return to clinic in 1 week for consideration of cycle 2.   Patient expressed understanding and was in agreement with this plan. She also understands that She can  call clinic at any time with any questions, concerns, or complaints.   Lloyd Huger, MD   08/09/2015 10:26 AM

## 2015-08-09 NOTE — Progress Notes (Signed)
Patient is going to continue the Augmentin per PCP for sinus infection.  She has not been sleeping well, feeling nausea, and itching this weekend.

## 2015-08-09 NOTE — Progress Notes (Signed)
1136 pt complaining of itchy, scratchy throat.  Rituxan stopped, 25mg  IV benadryl given. VSS. Dr Grayland Ormond notified, saw pt at chairside. Decadron 10mg  IV ordered and then plan to restart Rituxan. Pt states no itching of throat after Benadryl given and Rituxan stopped. 1155-Decadron given. 1225-Rituxan restarted. Rituxan finished at 1500. Pt tolerated well.

## 2015-08-10 ENCOUNTER — Telehealth: Payer: Self-pay | Admitting: Family Medicine

## 2015-08-10 NOTE — Telephone Encounter (Signed)
If she still needs an antibiotic, just taking the one she just finished is not good.  She needs to be seen in office if she thinks she needs another antibiotic and can't take Levaquin.-jh

## 2015-08-10 NOTE — Telephone Encounter (Signed)
Pt said Dr. Luan Pulling prescribed levaquin but she doesn't do well with that.  She asked if he would call in augmentin instead.  Her call back number is [phone]

## 2015-08-11 NOTE — Telephone Encounter (Signed)
Appt scheduled.[place] 

## 2015-08-12 ENCOUNTER — Encounter: Payer: Self-pay | Admitting: Family Medicine

## 2015-08-12 ENCOUNTER — Ambulatory Visit (INDEPENDENT_AMBULATORY_CARE_PROVIDER_SITE_OTHER): Payer: Commercial Managed Care - HMO | Admitting: Family Medicine

## 2015-08-12 VITALS — BP 121/70 | HR 74 | Temp 98.9°F | Resp 16 | Ht 63.5 in | Wt 179.0 lb

## 2015-08-12 DIAGNOSIS — I10 Essential (primary) hypertension: Secondary | ICD-10-CM

## 2015-08-12 DIAGNOSIS — J320 Chronic maxillary sinusitis: Secondary | ICD-10-CM | POA: Diagnosis not present

## 2015-08-12 DIAGNOSIS — D693 Immune thrombocytopenic purpura: Secondary | ICD-10-CM | POA: Diagnosis not present

## 2015-08-12 MED ORDER — HYDROCHLOROTHIAZIDE 25 MG PO TABS
25.0000 mg | ORAL_TABLET | Freq: Every day | ORAL | Status: DC
Start: 2015-08-12 — End: 2016-08-14

## 2015-08-12 MED ORDER — CLINDAMYCIN HCL 300 MG PO CAPS: 300.0000 mg | ORAL_CAPSULE | Freq: Three times a day (TID) | ORAL | Status: AC

## 2015-08-12 NOTE — Progress Notes (Signed)
Name: Erica Bartlett   MRN: [id number]    DOB: 1955/10/17   Date:08/12/2015       Progress Note  Subjective  Chief Complaint  Chief Complaint  Patient presents with  . Sinusitis    still stuffy runny nose coughing up mucus yellowish mucus    HPI Here c/o persistent nasal congestion and sinus pressure.  Has pnd and musous in throat.  She has been on high doses prednisone for 2-3 weeks and being treated for ITP.   She has finished 10 day  course of Augmentin  3 days ago.  She states that she gets severe N/V with Levaquin  No problem-specific assessment & plan notes found for this encounter.   Past Medical History  Diagnosis Date  . Hypertension     Past Surgical History  Procedure Laterality Date  . Cesarean section    . Cyst removed      breast, benign  . Abdominal hysterectomy      History reviewed. No pertinent family history.  Social History   Social History  . Marital Status: Widowed    Spouse Name: N/A  . Number of Children: N/A  . Years of Education: N/A   Occupational History  . Not on file.   Social History Main Topics  . Smoking status: Current Some Day Smoker  . Smokeless tobacco: Never Used  . Alcohol Use: No  . Drug Use: No  . Sexual Activity: Yes    Birth Control/ Protection: Post-menopausal   Other Topics Concern  . Not on file   Social History Narrative     Current outpatient prescriptions:  .  acetaminophen (TYLENOL) 500 MG tablet, Take 500 mg by mouth every 6 (six) hours as needed., Disp: , Rfl:  .  albuterol (PROVENTIL HFA;VENTOLIN HFA) 108 (90 BASE) MCG/ACT inhaler, Inhale 2 puffs into the lungs every 6 (six) hours as needed for wheezing or shortness of breath., Disp: 1 Inhaler, Rfl: 3 .  amLODipine (NORVASC) 10 MG tablet, Take 1 tablet (10 mg total) by mouth daily., Disp: 30 tablet, Rfl: 6 .  amoxicillin-clavulanate (AUGMENTIN) 875-125 MG tablet, Take 1 tablet by mouth 2 (two) times daily., Disp: 20 tablet, Rfl: 0 .  clonazePAM  (KLONOPIN) 1 MG tablet, Take 1-2 mg by mouth 3 (three) times daily as needed. , Disp: , Rfl:  .  cyclobenzaprine (FLEXERIL) 10 MG tablet, Take 1 tablet (10 mg total) by mouth 3 (three) times daily as needed for muscle spasms., Disp: 30 tablet, Rfl: 1 .  hydrochlorothiazide (HYDRODIURIL) 25 MG tablet, Take 1 tablet (25 mg total) by mouth daily., Disp: 30 tablet, Rfl: 12 .  predniSONE (DELTASONE) 5 MG tablet, Take 1 tablet (5 mg total) by mouth daily with breakfast. Take 4 tabs (50m) for 3 days, then 3 tabs (143m for 3 days, then 2 tabs (1089mfor 3 days, then 1 tab (5mg39mor 3 days, then 1/2 tab (2.5mg)51mr 4 days then stop., Disp: 32 tablet, Rfl: 0 .  pregabalin (LYRICA) 75 MG capsule, Take 1 capsule (75 mg total) by mouth 3 (three) times daily., Disp: 90 capsule, Rfl: 5 .  clindamycin (CLEOCIN) 300 MG capsule, Take 1 capsule (300 mg total) by mouth 3 (three) times daily., Disp: 30 capsule, Rfl: 0  Not on File   Review of Systems  Constitutional: Positive for malaise/fatigue. Negative for fever, chills, weight loss and diaphoresis.  HENT: Positive for congestion. Negative for sore throat.   Eyes: Negative for blurred vision and double  vision.  Respiratory: Negative for cough, shortness of breath and wheezing.   Cardiovascular: Negative for chest pain, palpitations and leg swelling.  Gastrointestinal: Negative for heartburn, abdominal pain and blood in stool.  Genitourinary: Negative for dysuria, urgency and frequency.  Musculoskeletal: Positive for myalgias and joint pain.  Skin: Negative for rash.  Neurological: Positive for headaches. Negative for weakness.      Objective  Filed Vitals:   08/12/15 1614  BP: 121/70  Pulse: 74  Temp: 98.9 F (37.2 C)  TempSrc: Oral  Resp: 16  Height: 5' 3.5" (1.613 m)  Weight: 179 lb (81.194 kg)  SpO2: 100%    Physical Exam  Constitutional: She is oriented to person, place, and time and well-developed, well-nourished, and in no distress. No  distress.  HENT:  Head: Normocephalic and atraumatic.  Right Ear: External ear normal.  Left Ear: External ear normal.  Nose: Rhinorrhea present. Right sinus exhibits maxillary sinus tenderness. Left sinus exhibits maxillary sinus tenderness.  Mouth/Throat: Oropharynx is clear and moist.  Neck: Normal range of motion. Neck supple. No thyromegaly present.  Cardiovascular: Normal rate and regular rhythm.  Exam reveals no gallop and no friction rub.   No murmur heard. Pulmonary/Chest: Effort normal and breath sounds normal. No respiratory distress. She has no wheezes. She has no rales.  Musculoskeletal: She exhibits no edema.  Lymphadenopathy:    She has no cervical adenopathy.  Neurological: She is alert and oriented to person, place, and time.  Vitals reviewed.      Recent Results (from the past 2160 hour(s))  CBC with Differential     Status: Abnormal   Collection Time: 07/26/15  1:58 PM  Result Value Ref Range   WBC 5.1 3.4 - 10.8 x10E3/uL   RBC 5.01 3.77 - 5.28 x10E6/uL   Hemoglobin 14.5 11.1 - 15.9 g/dL   Hematocrit 42.6 34.0 - 46.6 %   MCV 85 79 - 97 fL   MCH 28.9 26.6 - 33.0 pg   MCHC 34.0 31.5 - 35.7 g/dL   RDW 15.3 12.3 - 15.4 %   Platelets 3 (<) 150 - 379 x10E3/uL    Comment: Platelet count verified by examination of peripheral blood smear.   Neutrophils 50 %   Lymphs 33 %   Monocytes 7 %   Eos 8 %   Basos 1 %   Neutrophils Absolute 2.6 1.4 - 7.0 x10E3/uL   Lymphocytes Absolute 1.7 0.7 - 3.1 x10E3/uL   Monocytes Absolute 0.3 0.1 - 0.9 x10E3/uL   EOS (ABSOLUTE) 0.4 0.0 - 0.4 x10E3/uL   Basophils Absolute 0.1 0.0 - 0.2 x10E3/uL   Immature Granulocytes 1 %   Immature Grans (Abs) 0.0 0.0 - 0.1 x10E3/uL   Hematology Comments: Note:     Comment: Verified by microscopic examination.  Comprehensive Metabolic Panel (CMET)     Status: None   Collection Time: 07/26/15  1:58 PM  Result Value Ref Range   Glucose 76 65 - 99 mg/dL   BUN 9 6 - 24 mg/dL   Creatinine, Ser  0.92 0.57 - 1.00 mg/dL   GFR calc non Af Amer 69 >59 mL/min/1.73   GFR calc Af Amer 80 >59 mL/min/1.73   BUN/Creatinine Ratio 10 9 - 23    Comment:               **Please note reference interval change**   Sodium 142 134 - 144 mmol/L   Potassium 4.0 3.5 - 5.2 mmol/L   Chloride 103 96 - 106  mmol/L   CO2 24 18 - 29 mmol/L   Calcium 9.6 8.7 - 10.2 mg/dL   Total Protein 7.5 6.0 - 8.5 g/dL   Albumin 4.2 3.5 - 5.5 g/dL   Globulin, Total 3.3 1.5 - 4.5 g/dL   Albumin/Globulin Ratio 1.3 1.2 - 2.2    Comment:               **Please note reference interval change**   Bilirubin Total 0.3 0.0 - 1.2 mg/dL   Alkaline Phosphatase 94 39 - 117 IU/L   AST 29 0 - 40 IU/L   ALT 31 0 - 32 IU/L  CBC     Status: Abnormal   Collection Time: 07/27/15  5:15 AM  Result Value Ref Range   WBC 5.4 3.6 - 11.0 K/uL   RBC 4.96 3.80 - 5.20 MIL/uL   Hemoglobin 14.1 12.0 - 16.0 g/dL   HCT 41.7 35.0 - 47.0 %   MCV 84.1 80.0 - 100.0 fL   MCH 28.4 26.0 - 34.0 pg   MCHC 33.8 32.0 - 36.0 g/dL   RDW 14.6 (H) 11.5 - 14.5 %   Platelets 11 (LL) 150 - 440 K/uL    Comment: CRITICAL RESULT CALLED TO, READ BACK BY AND VERIFIED WITH: NOEL WEBSTER AT 0600 CAF   Comprehensive metabolic panel     Status: Abnormal   Collection Time: 07/27/15  5:15 AM  Result Value Ref Range   Sodium 138 135 - 145 mmol/L   Potassium 3.2 (L) 3.5 - 5.1 mmol/L   Chloride 108 101 - 111 mmol/L   CO2 25 22 - 32 mmol/L   Glucose, Bld 103 (H) 65 - 99 mg/dL   BUN 13 6 - 20 mg/dL   Creatinine, Ser 0.86 0.44 - 1.00 mg/dL   Calcium 9.1 8.9 - 10.3 mg/dL   Total Protein 7.7 6.5 - 8.1 g/dL   Albumin 3.9 3.5 - 5.0 g/dL   AST 36 15 - 41 U/L   ALT 34 14 - 54 U/L   Alkaline Phosphatase 81 38 - 126 U/L   Total Bilirubin 0.3 0.3 - 1.2 mg/dL   GFR calc non Af Amer >60 >60 mL/min   GFR calc Af Amer >60 >60 mL/min    Comment: (NOTE) The eGFR has been calculated using the CKD EPI equation. This calculation has not been validated in all clinical  situations. eGFR's persistently <60 mL/min signify possible Chronic Kidney Disease.    Anion gap 5 5 - 15  Protime-INR     Status: None   Collection Time: 07/27/15  5:15 AM  Result Value Ref Range   Prothrombin Time 12.5 11.4 - 15.0 seconds   INR 0.91   APTT     Status: None   Collection Time: 07/27/15  5:15 AM  Result Value Ref Range   aPTT 28 24 - 36 seconds  Rapid HIV screen (HIV 1/2 Ab+Ag)     Status: None   Collection Time: 07/27/15  5:15 AM  Result Value Ref Range   HIV-1 P24 Antigen - HIV24 NON REACTIVE NON REACTIVE   HIV 1/2 Antibodies NON REACTIVE NON REACTIVE   Interpretation (HIV Ag Ab)      A non reactive test result means that HIV 1 or HIV 2 antibodies and HIV 1 p24 antigen were not detected in the specimen.  Fibrinogen     Status: None   Collection Time: 07/27/15  5:15 AM  Result Value Ref Range   Fibrinogen 402 210 -  470 mg/dL  Lactate dehydrogenase     Status: Abnormal   Collection Time: 07/27/15  5:15 AM  Result Value Ref Range   LDH 209 (H) 98 - 192 U/L  Differential     Status: None   Collection Time: 07/27/15  5:15 AM  Result Value Ref Range   Neutrophils Relative % 43% %   Neutro Abs 2.4 1.4 - 6.5 K/uL   Lymphocytes Relative 38% %   Lymphs Abs 2.0 1.0 - 3.6 K/uL   Monocytes Relative 8% %   Monocytes Absolute 0.4 0.2 - 0.9 K/uL   Eosinophils Relative 10% %   Eosinophils Absolute 0.5 0 - 0.7 K/uL   Basophils Relative 1% %   Basophils Absolute 0.0 0 - 0.1 K/uL  Pathologist smear review     Status: None   Collection Time: 07/27/15  5:15 AM  Result Value Ref Range   Path Review Peripheral smear is adequate for review.     Comment: RBC with anisocytosis and rouleaux. WBC unremarkable. Marked thrombocytopenia with variation in size. Giant platelets present. RBC platelet overlap. Negative for blasts. Discussed with Dr. Grayland Ormond on 07/27/2015 at 8:35 AM. Reviewed by Dellia Nims. Reuel Derby, M.D.   ABO/Rh     Status: None   Collection Time: 07/27/15  5:15 AM   Result Value Ref Range   ABO/RH(D) A POS   Urinalysis complete, with microscopic ([facility] only)     Status: Abnormal   Collection Time: 07/27/15  5:23 AM  Result Value Ref Range   Color, Urine YELLOW (A) YELLOW   APPearance CLEAR (A) CLEAR   Glucose, UA NEGATIVE NEGATIVE mg/dL   Bilirubin Urine NEGATIVE NEGATIVE   Ketones, ur NEGATIVE NEGATIVE mg/dL   Specific Gravity, Urine 1.025 1.005 - 1.030   Hgb urine dipstick 2+ (A) NEGATIVE   pH 6.0 5.0 - 8.0   Protein, ur 30 (A) NEGATIVE mg/dL   Nitrite NEGATIVE NEGATIVE   Leukocytes, UA TRACE (A) NEGATIVE   RBC / HPF 6-30 0 - 5 RBC/hpf   WBC, UA 0-5 0 - 5 WBC/hpf   Bacteria, UA NONE SEEN NONE SEEN   Squamous Epithelial / LPF 0-5 (A) NONE SEEN   Mucous PRESENT   Prepare Pheresed Platelets     Status: None   Collection Time: 07/27/15  7:23 AM  Result Value Ref Range   Unit Number [id number]    Blood Component Type PLTP LR3 PAS    Unit division 00    Status of Unit ISSUED,FINAL    Transfusion Status OK TO TRANSFUSE   DIRECT PLATELET ANTIBODY     Status: Abnormal   Collection Time: 07/27/15  8:26 AM  Result Value Ref Range   Plt Assoc. Anti-IIB/IIIA Positive (A)     Comment: (NOTE) Glycoprotein IIb/IIIa screens for antibodies to HPA-1a, HPA-1b, HPA-3a, HPA-3b, HPA-4a and HPA-4b. The presence of immune complexes or other immunoglobulin aggregates in the patient sample may cause increased non-specific binding and produce false-positive results in either the direct and indirect platelet antibody assays. Reference Range: Negative    Plt Assoc. Anti-IB/IX Positive (A)     Comment: (NOTE) Glycoprotein Ib/IX screens for antibodies to HPA-2a and HPA-2b. The presence of immune complexes or other immunoglobulin aggregates in the patient sample may cause increased non-specific binding and produce false-positive results in either the direct and indirect platelet antibody assays. Reference Range: Negative    Plt Assoc. Anti-IA/IIA  Positive (A)     Comment: (NOTE) Glycoprotein Ia/IIa screens for antibodies to HPA-5a  and HPA-5b. The presence of immune complexes or other immunoglobulin aggregates in the patient sample may cause increased non-specific binding and produce false-positive results in either the direct and indirect platelet antibody assays. Reference Range: Negative Performed At: [facility] [address] [place], [place] [id number] Melida Quitter MD JA:[id number]   CBC with Differential/Platelet     Status: Abnormal   Collection Time: 07/27/15  6:40 PM  Result Value Ref Range   WBC 6.6 3.6 - 11.0 K/uL   RBC 5.01 3.80 - 5.20 MIL/uL   Hemoglobin 14.5 12.0 - 16.0 g/dL   HCT 42.1 35.0 - 47.0 %   MCV 84.1 80.0 - 100.0 fL   MCH 28.9 26.0 - 34.0 pg   MCHC 34.4 32.0 - 36.0 g/dL   RDW 14.8 (H) 11.5 - 14.5 %   Platelets 61 (L) 150 - 440 K/uL   Neutrophils Relative % 84% %   Neutro Abs 5.5 1.4 - 6.5 K/uL   Lymphocytes Relative 15% %   Lymphs Abs 1.0 1.0 - 3.6 K/uL   Monocytes Relative 1% %   Monocytes Absolute 0.1 (L) 0.2 - 0.9 K/uL   Eosinophils Relative 0% %   Eosinophils Absolute 0.0 0 - 0.7 K/uL   Basophils Relative 0% %   Basophils Absolute 0.0 0 - 0.1 K/uL  Basic metabolic panel     Status: Abnormal   Collection Time: 07/28/15  5:15 AM  Result Value Ref Range   Sodium 137 135 - 145 mmol/L   Potassium 3.3 (L) 3.5 - 5.1 mmol/L   Chloride 110 101 - 111 mmol/L   CO2 23 22 - 32 mmol/L   Glucose, Bld 96 65 - 99 mg/dL   BUN 9 6 - 20 mg/dL   Creatinine, Ser 0.76 0.44 - 1.00 mg/dL   Calcium 9.0 8.9 - 10.3 mg/dL   GFR calc non Af Amer >60 >60 mL/min   GFR calc Af Amer >60 >60 mL/min    Comment: (NOTE) The eGFR has been calculated using the CKD EPI equation. This calculation has not been validated in all clinical situations. eGFR's persistently <60 mL/min signify possible Chronic Kidney Disease.    Anion gap 4 (L) 5 - 15  CBC     Status: Abnormal   Collection Time:  07/28/15  5:15 AM  Result Value Ref Range   WBC 8.5 3.6 - 11.0 K/uL   RBC 4.63 3.80 - 5.20 MIL/uL   Hemoglobin 13.3 12.0 - 16.0 g/dL   HCT 39.5 35.0 - 47.0 %   MCV 85.3 80.0 - 100.0 fL   MCH 28.7 26.0 - 34.0 pg   MCHC 33.7 32.0 - 36.0 g/dL   RDW 15.1 (H) 11.5 - 14.5 %   Platelets 33 (L) 150 - 440 K/uL  Magnesium     Status: None   Collection Time: 07/28/15  5:15 AM  Result Value Ref Range   Magnesium 2.1 1.7 - 2.4 mg/dL  CBC with Differential/Platelet     Status: Abnormal   Collection Time: 07/29/15  4:35 AM  Result Value Ref Range   WBC 10.6 3.6 - 11.0 K/uL   RBC 4.53 3.80 - 5.20 MIL/uL   Hemoglobin 13.2 12.0 - 16.0 g/dL   HCT 38.5 35.0 - 47.0 %   MCV 85.0 80.0 - 100.0 fL   MCH 29.1 26.0 - 34.0 pg   MCHC 34.2 32.0 - 36.0 g/dL   RDW 14.8 (H) 11.5 - 14.5 %   Platelets 32 (  L) 150 - 440 K/uL   Neutrophils Relative % 75% %   Neutro Abs 8.0 (H) 1.4 - 6.5 K/uL   Lymphocytes Relative 19% %   Lymphs Abs 2.0 1.0 - 3.6 K/uL   Monocytes Relative 6% %   Monocytes Absolute 0.6 0.2 - 0.9 K/uL   Eosinophils Relative 0% %   Eosinophils Absolute 0.0 0 - 0.7 K/uL   Basophils Relative 0% %   Basophils Absolute 0.0 0 - 0.1 K/uL  Potassium     Status: None   Collection Time: 07/29/15  4:35 AM  Result Value Ref Range   Potassium 4.2 3.5 - 5.1 mmol/L  CBC with Differential/Platelet     Status: Abnormal   Collection Time: 07/30/15  8:43 AM  Result Value Ref Range   WBC 11.1 (H) 3.6 - 11.0 K/uL   RBC 5.22 (H) 3.80 - 5.20 MIL/uL   Hemoglobin 15.1 12.0 - 16.0 g/dL   HCT 43.9 35.0 - 47.0 %   MCV 84.2 80.0 - 100.0 fL   MCH 28.9 26.0 - 34.0 pg   MCHC 34.3 32.0 - 36.0 g/dL   RDW 14.8 (H) 11.5 - 14.5 %   Platelets 54 (L) 150 - 440 K/uL   Neutrophils Relative % 55 %   Neutro Abs 6.1 1.4 - 6.5 K/uL   Lymphocytes Relative 39 %   Lymphs Abs 4.3 (H) 1.0 - 3.6 K/uL   Monocytes Relative 5 %   Monocytes Absolute 0.6 0.2 - 0.9 K/uL   Eosinophils Relative 0 %   Eosinophils Absolute 0.0 0 - 0.7 K/uL    Basophils Relative 1 %   Basophils Absolute 0.1 0 - 0.1 K/uL  CBC with Differential/Platelet     Status: Abnormal   Collection Time: 08/02/15  8:57 AM  Result Value Ref Range   WBC 14.1 (H) 3.6 - 11.0 K/uL   RBC 5.54 (H) 3.80 - 5.20 MIL/uL   Hemoglobin 16.0 12.0 - 16.0 g/dL   HCT 46.6 35.0 - 47.0 %   MCV 84.0 80.0 - 100.0 fL   MCH 28.8 26.0 - 34.0 pg   MCHC 34.3 32.0 - 36.0 g/dL   RDW 14.9 (H) 11.5 - 14.5 %   Platelets 50 (L) 150 - 440 K/uL   Neutrophils Relative % 76 %   Neutro Abs 10.6 (H) 1.4 - 6.5 K/uL   Lymphocytes Relative 19 %   Lymphs Abs 2.7 1.0 - 3.6 K/uL   Monocytes Relative 5 %   Monocytes Absolute 0.7 0.2 - 0.9 K/uL   Eosinophils Relative 0 %   Eosinophils Absolute 0.0 0 - 0.7 K/uL   Basophils Relative 0 %   Basophils Absolute 0.0 0 - 0.1 K/uL  Sample to Blood Bank     Status: None   Collection Time: 08/02/15  8:58 AM  Result Value Ref Range   Blood Bank Specimen SAMPLE AVAILABLE FOR TESTING    Sample Expiration 63/33/5456   Basic Metabolic Panel (BMET)     Status: Abnormal   Collection Time: 08/04/15  9:38 AM  Result Value Ref Range   Glucose 91 65 - 99 mg/dL   BUN 17 6 - 24 mg/dL   Creatinine, Ser 0.98 0.57 - 1.00 mg/dL   GFR calc non Af Amer 64 >59 mL/min/1.73   GFR calc Af Amer 74 >59 mL/min/1.73   BUN/Creatinine Ratio 17 9 - 23   Sodium 140 134 - 144 mmol/L   Potassium 3.5 3.5 - 5.2 mmol/L   Chloride 95 (  L) 96 - 106 mmol/L   CO2 21 18 - 29 mmol/L   Calcium 9.3 8.7 - 10.2 mg/dL  CBC with Differential/Platelet     Status: Abnormal   Collection Time: 08/09/15  8:39 AM  Result Value Ref Range   WBC 10.0 3.6 - 11.0 K/uL   RBC 5.19 3.80 - 5.20 MIL/uL   Hemoglobin 15.0 12.0 - 16.0 g/dL   HCT 43.3 35.0 - 47.0 %   MCV 83.3 80.0 - 100.0 fL   MCH 28.9 26.0 - 34.0 pg   MCHC 34.6 32.0 - 36.0 g/dL   RDW 14.3 11.5 - 14.5 %   Platelets 56 (L) 150 - 440 K/uL   Neutrophils Relative % 59 %   Neutro Abs 5.8 1.4 - 6.5 K/uL   Lymphocytes Relative 32 %   Lymphs  Abs 3.2 1.0 - 3.6 K/uL   Monocytes Relative 8 %   Monocytes Absolute 0.8 0.2 - 0.9 K/uL   Eosinophils Relative 0 %   Eosinophils Absolute 0.0 0 - 0.7 K/uL   Basophils Relative 1 %   Basophils Absolute 0.1 0 - 0.1 K/uL  Sample to Blood Bank     Status: None   Collection Time: 08/09/15  8:40 AM  Result Value Ref Range   Blood Bank Specimen SAMPLE AVAILABLE FOR TESTING    Sample Expiration 08/12/2015      Assessment & Plan  Problem List Items Addressed This Visit      Cardiovascular and Mediastinum   Hypertension   Relevant Medications   hydrochlorothiazide (HYDRODIURIL) 25 MG tablet     Hematopoietic and Hemostatic   ITP (idiopathic thrombocytopenic purpura)    Other Visit Diagnoses    Chronic maxillary sinusitis    -  Primary    Relevant Medications    clindamycin (CLEOCIN) 300 MG capsule       Meds ordered this encounter  Medications  . hydrochlorothiazide (HYDRODIURIL) 25 MG tablet    Sig: Take 1 tablet (25 mg total) by mouth daily.    Dispense:  30 tablet    Refill:  12  . clindamycin (CLEOCIN) 300 MG capsule    Sig: Take 1 capsule (300 mg total) by mouth 3 (three) times daily.    Dispense:  30 capsule    Refill:  0  1. Chronic maxillary sinusitis  - clindamycin (CLEOCIN) 300 MG capsule; Take 1 capsule (300 mg total) by mouth 3 (three) times daily.  Dispense: 30 capsule; Refill: 0  2. Essential hypertension  - hydrochlorothiazide (HYDRODIURIL) 25 MG tablet; Take 1 tablet (25 mg total) by mouth daily.  Dispense: 30 tablet; Refill: 12  3. ITP (idiopathic thrombocytopenic purpura)

## 2015-08-16 ENCOUNTER — Inpatient Hospital Stay: Payer: Commercial Managed Care - HMO

## 2015-08-16 ENCOUNTER — Inpatient Hospital Stay (HOSPITAL_BASED_OUTPATIENT_CLINIC_OR_DEPARTMENT_OTHER): Payer: Commercial Managed Care - HMO | Admitting: Oncology

## 2015-08-16 VITALS — BP 113/81 | HR 77 | Temp 98.2°F | Resp 18 | Wt 180.8 lb

## 2015-08-16 DIAGNOSIS — F17210 Nicotine dependence, cigarettes, uncomplicated: Secondary | ICD-10-CM

## 2015-08-16 DIAGNOSIS — D693 Immune thrombocytopenic purpura: Secondary | ICD-10-CM | POA: Diagnosis not present

## 2015-08-16 DIAGNOSIS — D696 Thrombocytopenia, unspecified: Secondary | ICD-10-CM

## 2015-08-16 DIAGNOSIS — Z79899 Other long term (current) drug therapy: Secondary | ICD-10-CM | POA: Diagnosis not present

## 2015-08-16 DIAGNOSIS — R0602 Shortness of breath: Secondary | ICD-10-CM | POA: Diagnosis not present

## 2015-08-16 DIAGNOSIS — I10 Essential (primary) hypertension: Secondary | ICD-10-CM | POA: Diagnosis not present

## 2015-08-16 LAB — CBC WITH DIFFERENTIAL/PLATELET
BASOS ABS: 0 10*3/uL (ref 0–0.1)
Basophils Relative: 0 %
EOS ABS: 0.1 10*3/uL (ref 0–0.7)
Eosinophils Relative: 1 %
HCT: 41.6 % (ref 35.0–47.0)
HEMOGLOBIN: 14.3 g/dL (ref 12.0–16.0)
LYMPHS ABS: 2.6 10*3/uL (ref 1.0–3.6)
LYMPHS PCT: 28 %
MCH: 28.9 pg (ref 26.0–34.0)
MCHC: 34.4 g/dL (ref 32.0–36.0)
MCV: 84.1 fL (ref 80.0–100.0)
Monocytes Absolute: 0.4 10*3/uL (ref 0.2–0.9)
Monocytes Relative: 5 %
NEUTROS PCT: 66 %
Neutro Abs: 6.2 10*3/uL (ref 1.4–6.5)
PLATELETS: 103 10*3/uL — AB (ref 150–440)
RBC: 4.94 MIL/uL (ref 3.80–5.20)
RDW: 14.6 % — ABNORMAL HIGH (ref 11.5–14.5)
WBC: 9.4 10*3/uL (ref 3.6–11.0)

## 2015-08-16 LAB — SAMPLE TO BLOOD BANK

## 2015-08-16 MED ORDER — SODIUM CHLORIDE 0.9 % IV SOLN
Freq: Once | INTRAVENOUS | Status: AC
  Administered 2015-08-16: 10:00:00 via INTRAVENOUS
  Filled 2015-08-16: qty 1000

## 2015-08-16 MED ORDER — SODIUM CHLORIDE 0.9 % IV SOLN
375.0000 mg/m2 | Freq: Once | INTRAVENOUS | Status: AC
  Administered 2015-08-16: 700 mg via INTRAVENOUS
  Filled 2015-08-16: qty 60

## 2015-08-16 MED ORDER — ACETAMINOPHEN 325 MG PO TABS
650.0000 mg | ORAL_TABLET | Freq: Once | ORAL | Status: AC
  Administered 2015-08-16: 650 mg via ORAL
  Filled 2015-08-16: qty 2

## 2015-08-16 MED ORDER — SODIUM CHLORIDE 0.9 % IV SOLN
10.0000 mg | Freq: Once | INTRAVENOUS | Status: AC
  Administered 2015-08-16: 10 mg via INTRAVENOUS
  Filled 2015-08-16: qty 1

## 2015-08-16 MED ORDER — DIPHENHYDRAMINE HCL 25 MG PO CAPS
25.0000 mg | ORAL_CAPSULE | Freq: Once | ORAL | Status: AC
  Administered 2015-08-16: 25 mg via ORAL
  Filled 2015-08-16: qty 1

## 2015-08-16 MED ORDER — SODIUM CHLORIDE 0.9 % IV SOLN: 375.0000 mg/m2 | Freq: Once | INTRAVENOUS | Status: DC

## 2015-08-16 NOTE — Progress Notes (Signed)
Patient does not offer any problems today.  

## 2015-08-16 NOTE — Progress Notes (Signed)
[place]  Telephone:(336) [phone] [phone]) [phone]  ID: Erica Bartlett OB: [birth year]  MR#: [id number]  YPP#:[id number]  Patient Care Team: Arlis Porta., MD as PCP - General (Family Medicine)  CHIEF COMPLAINT:  Chief Complaint  Patient presents with  . ITP    INTERVAL HISTORY: Patient returns to clinic today for repeat laboratory work and consideration of cycle 2 of 4 of weekly Rituxan. She currently feels well and is at her baseline. She did have a reaction early in her infusion last week with a feeling of 'bees in her mouth' and was given more benadryl but was able to finish the infusion without any other problems. She is feeling a little 'queezy' this morning but thinks it is nerves. She does not complain of easy bleeding or bruising.  She denies any recent fevers or illnesses. She has no neurologic complaints. She denies any chest pain or shortness of breath. She has no nausea, vomiting, constipation, or diarrhea. She has no urinary complaints. Patient offers no specific complaints today.  REVIEW OF SYSTEMS:   Review of Systems  Constitutional: Negative.  Negative for fever and malaise/fatigue.  Respiratory: Negative for shortness of breath.   Cardiovascular: Negative.  Negative for chest pain.  Gastrointestinal: Negative.  Negative for blood in stool and melena.  Genitourinary: Negative.  Negative for hematuria.  Neurological: Negative.  Negative for weakness.  Endo/Heme/Allergies: Does not bruise/bleed easily.  Psychiatric/Behavioral: The patient is nervous/anxious.     As per HPI. Otherwise, a complete review of systems is negatve.  PAST MEDICAL HISTORY: Past Medical History  Diagnosis Date  . Hypertension     PAST SURGICAL HISTORY: Past Surgical History  Procedure Laterality Date  . Cesarean section    . Cyst removed      breast, benign  . Abdominal hysterectomy      FAMILY HISTORY: Reviewed and unchanged. No reported history  of malignancy or chronic disease.     ADVANCED DIRECTIVES:    HEALTH MAINTENANCE: Social History  Substance Use Topics  . Smoking status: Current Some Day Smoker  . Smokeless tobacco: Never Used  . Alcohol Use: No    Not on File  Current Outpatient Prescriptions  Medication Sig Dispense Refill  . acetaminophen (TYLENOL) 500 MG tablet Take 500 mg by mouth every 6 (six) hours as needed.    Marland Kitchen albuterol (PROVENTIL HFA;VENTOLIN HFA) 108 (90 BASE) MCG/ACT inhaler Inhale 2 puffs into the lungs every 6 (six) hours as needed for wheezing or shortness of breath. 1 Inhaler 3  . amLODipine (NORVASC) 10 MG tablet Take 1 tablet (10 mg total) by mouth daily. 30 tablet 6  . amoxicillin-clavulanate (AUGMENTIN) 875-125 MG tablet Take 1 tablet by mouth 2 (two) times daily. 20 tablet 0  . clindamycin (CLEOCIN) 300 MG capsule Take 1 capsule (300 mg total) by mouth 3 (three) times daily. 30 capsule 0  . clonazePAM (KLONOPIN) 1 MG tablet Take 1-2 mg by mouth 3 (three) times daily as needed.     . cyclobenzaprine (FLEXERIL) 10 MG tablet Take 1 tablet (10 mg total) by mouth 3 (three) times daily as needed for muscle spasms. 30 tablet 1  . hydrochlorothiazide (HYDRODIURIL) 25 MG tablet Take 1 tablet (25 mg total) by mouth daily. 30 tablet 12  . predniSONE (DELTASONE) 5 MG tablet Take 1 tablet (5 mg total) by mouth daily with breakfast. Take 4 tabs (83m) for 3 days, then 3 tabs (167m for 3 days, then 2 tabs (1043mfor  3 days, then 1 tab (34m) for 3 days, then 1/2 tab (2.56m for 4 days then stop. 32 tablet 0  . pregabalin (LYRICA) 75 MG capsule Take 1 capsule (75 mg total) by mouth 3 (three) times daily. 90 capsule 5   No current facility-administered medications for this visit.    OBJECTIVE: Filed Vitals:   08/16/15 0855  BP: 113/81  Pulse: 77  Temp: 98.2 F (36.8 C)  Resp: 18     Body mass index is 31.52 kg/(m^2).    ECOG FS:0 - Asymptomatic  General: Well-developed, well-nourished, no acute  distress. Eyes: Pink conjunctiva, anicteric sclera. Lungs: Clear to auscultation bilaterally. Heart: Regular rate and rhythm. No rubs, murmurs, or gallops. Abdomen: Soft, nontender, nondistended. No organomegaly noted, normoactive bowel sounds. Musculoskeletal: No edema, cyanosis, or clubbing. Neuro: Alert, answering all questions appropriately. Cranial nerves grossly intact. Skin: No rashes or petechiae noted. Psych: Normal affect.   LAB RESULTS:  Lab Results  Component Value Date   NA 140 08/04/2015   K 3.5 08/04/2015   CL 95* 08/04/2015   CO2 21 08/04/2015   GLUCOSE 91 08/04/2015   BUN 17 08/04/2015   CREATININE 0.98 08/04/2015   CALCIUM 9.3 08/04/2015   PROT 7.7 07/27/2015   ALBUMIN 3.9 07/27/2015   AST 36 07/27/2015   ALT 34 07/27/2015   ALKPHOS 81 07/27/2015   BILITOT 0.3 07/27/2015   GFRNONAA 64 08/04/2015   GFRAA 74 08/04/2015    Lab Results  Component Value Date   WBC 9.4 08/16/2015   NEUTROABS 6.2 08/16/2015   HGB 14.3 08/16/2015   HCT 41.6 08/16/2015   MCV 84.1 08/16/2015   PLT 103* 08/16/2015     STUDIES: Ct Biopsy  07/28/2015  INDICATION: Thrombocytopenia of uncertain etiology. Please perform CT-guided bone marrow biopsy for tissue diagnostic purposes. EXAM: CT-GUIDED BONE MARROW BIOPSY AND ASPIRATION MEDICATIONS: None ANESTHESIA/SEDATION: Fentanyl 75 mcg IV Sedation Time: 9 minutes; The patient was continuously monitored during the procedure by the interventional radiology nurse under my direct supervision. COMPLICATIONS: None immediate. PROCEDURE: Informed consent was obtained from the patient following an explanation of the procedure, risks, benefits and alternatives. The patient understands, agrees and consents for the procedure. All questions were addressed. A time out was performed prior to the initiation of the procedure. The patient was positioned prone and non-contrast localization CT was performed of the pelvis to demonstrate the iliac marrow  spaces. The operative site was prepped and draped in the usual sterile fashion. Under sterile conditions and local anesthesia, a 22 gauge spinal needle was utilized for procedural planning. Next, an 11 gauge coaxial bone biopsy needle was advanced into the left iliac marrow space. Needle position was confirmed with CT imaging. Initially, bone marrow aspiration was performed. Next, a bone marrow biopsy was obtained with the 11 gauge outer bone marrow device. Samples were prepared with the cytotechnologist and deemed adequate. The needle was removed intact. Hemostasis was obtained with compression and a dressing was placed. The patient tolerated the procedure well without immediate post procedural complication. IMPRESSION: Successful CT guided left iliac bone marrow aspiration and core biopsy. Electronically Signed   By: JoSandi Mariscal.D.   On: 07/28/2015 11:54    ASSESSMENT: Bone marrow biopsy proven ITP.  PLAN:    1. ITP: Bone marrow biopsy performed on July 28, 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 Patient had a minimal response to prednisone increasing her platelet count into the 50s, but not as robust as  hoped. Patient is still tapering her prednisone and is down to 2 tabs/day now and will be finished next week. Proceed with cycle 2 of 4 of weekly Rituxan. Return to clinic in 1 week for consideration of cycle 2. Because of patient's also reaction during cycle 1, she will be given 10 mg Decadron with the remainder of her infusions as a premed.  Patient expressed understanding and was in agreement with this plan. She also understands that She can call clinic at any time with any questions, concerns, or complaints.   Mayra Reel, NP   08/16/2015 9:19 AM  Patient was seen and evaluated independently and I agree with the assessment and plan as dictated above.  Lloyd Huger, MD 08/16/2015 2:14 PM

## 2015-08-17 ENCOUNTER — Encounter: Payer: Self-pay | Admitting: Podiatry

## 2015-08-17 ENCOUNTER — Ambulatory Visit (INDEPENDENT_AMBULATORY_CARE_PROVIDER_SITE_OTHER): Payer: Commercial Managed Care - HMO | Admitting: Podiatry

## 2015-08-17 VITALS — BP 87/59 | HR 89 | Resp 18

## 2015-08-17 DIAGNOSIS — L84 Corns and callosities: Secondary | ICD-10-CM

## 2015-08-17 DIAGNOSIS — M79676 Pain in unspecified toe(s): Secondary | ICD-10-CM | POA: Diagnosis not present

## 2015-08-17 DIAGNOSIS — B351 Tinea unguium: Secondary | ICD-10-CM | POA: Diagnosis not present

## 2015-08-17 LAB — HEPATITIS B SURFACE ANTIBODY, QUANTITATIVE

## 2015-08-17 LAB — HEPATITIS B SURFACE ANTIGEN: HEP B S AG: NEGATIVE

## 2015-08-17 NOTE — Progress Notes (Signed)
   Subjective:    Patient ID: Erica Bartlett, female    DOB: 1955-10-19, 60 y.o.   MRN: [id number]  HPI  60 year old female presents the office today for concerns of multiple ankle calluses to both of her feet as well as for thick, elongated toenails that she cannot herself. She sustained a podiatrist where she would get her feet are done quite often however she has not done this and greater than 1 year. Denies any recent injury or trauma. No swelling or redness. No tingling or numbness. No other complaints at this time.    Review of Systems  All other systems reviewed and are negative.      Objective:   Physical Exam General: AAO x3, NAD  Dermatological: There multiple hyperkeratotic lesions bilaterally. There present left foot sub hallux and 5th toe and on the right submetatarsal 2, plantar hallux, 4th interspace and 4th digit. Upon reaming there is no underlying ulceration, drainage or other signs of infection. The nails are hypertrophic, dystrophic, brittle, discolored, elongated 10. No swelling redness or drainage. There is tenderness to nails 1-5 bilaterally.  Vascular: Dorsalis Pedis artery and Posterior Tibial artery pedal pulses are 2/4 bilateral with immedate capillary fill time. Pedal hair growth present. No varicosities and no lower extremity edema present bilateral. There is no pain with calf compression, swelling, warmth, erythema.   Neruologic: Grossly intact via light touch bilateral. Vibratory intact via tuning fork bilateral. Protective threshold with Semmes Wienstein monofilament intact to all pedal sites bilateral. Patellar and Achilles deep tendon reflexes 2+ bilateral. No Babinski or clonus noted bilateral.   Musculoskeletal: No gross boney pedal deformities bilateral. No pain, crepitus, or limitation noted with foot and ankle range of motion bilateral. Muscular strength 5/5 in all groups tested bilateral.  Gait: Unassisted, Nonantalgic.      Assessment &  Plan:  60 year old female with symptomatic onychomycosis, hyperkeratotic lesions 6. -Treatment options discussed including all alternatives, risks, and complications -Etiology of symptoms were discussed -Nails are brittle 10 without complications or bleeding -Hyperkeratotic lesions debrided 6 without complications or bleeding -Daily foot inspection -Follow-up in 3 months or sooner if any problems arise. In the meantime, encouraged to call the office with any questions, concerns, change in symptoms.   Celesta Gentile, DPM

## 2015-08-23 ENCOUNTER — Inpatient Hospital Stay: Payer: Commercial Managed Care - HMO | Attending: Oncology

## 2015-08-23 ENCOUNTER — Inpatient Hospital Stay: Payer: Commercial Managed Care - HMO

## 2015-08-23 ENCOUNTER — Inpatient Hospital Stay (HOSPITAL_BASED_OUTPATIENT_CLINIC_OR_DEPARTMENT_OTHER): Payer: Commercial Managed Care - HMO | Admitting: Oncology

## 2015-08-23 VITALS — BP 147/85 | HR 72 | Temp 96.6°F | Resp 16 | Wt 181.9 lb

## 2015-08-23 DIAGNOSIS — I10 Essential (primary) hypertension: Secondary | ICD-10-CM | POA: Insufficient documentation

## 2015-08-23 DIAGNOSIS — Z79899 Other long term (current) drug therapy: Secondary | ICD-10-CM | POA: Diagnosis not present

## 2015-08-23 DIAGNOSIS — D693 Immune thrombocytopenic purpura: Secondary | ICD-10-CM

## 2015-08-23 DIAGNOSIS — D696 Thrombocytopenia, unspecified: Secondary | ICD-10-CM

## 2015-08-23 DIAGNOSIS — Z5112 Encounter for antineoplastic immunotherapy: Secondary | ICD-10-CM | POA: Insufficient documentation

## 2015-08-23 DIAGNOSIS — F17210 Nicotine dependence, cigarettes, uncomplicated: Secondary | ICD-10-CM

## 2015-08-23 LAB — CBC WITH DIFFERENTIAL/PLATELET
BASOS ABS: 0.1 10*3/uL (ref 0–0.1)
BASOS PCT: 1 %
Eosinophils Absolute: 0.1 10*3/uL (ref 0–0.7)
Eosinophils Relative: 1 %
HEMATOCRIT: 40 % (ref 35.0–47.0)
HEMOGLOBIN: 13.9 g/dL (ref 12.0–16.0)
LYMPHS ABS: 2.2 10*3/uL (ref 1.0–3.6)
Lymphocytes Relative: 25 %
MCH: 29.1 pg (ref 26.0–34.0)
MCHC: 34.7 g/dL (ref 32.0–36.0)
MCV: 83.8 fL (ref 80.0–100.0)
MONO ABS: 0.5 10*3/uL (ref 0.2–0.9)
MONOS PCT: 5 %
NEUTROS ABS: 5.9 10*3/uL (ref 1.4–6.5)
NEUTROS PCT: 68 %
Platelets: 37 10*3/uL — ABNORMAL LOW (ref 150–440)
RBC: 4.77 MIL/uL (ref 3.80–5.20)
RDW: 14.9 % — ABNORMAL HIGH (ref 11.5–14.5)
WBC: 8.9 10*3/uL (ref 3.6–11.0)

## 2015-08-23 LAB — SAMPLE TO BLOOD BANK

## 2015-08-23 MED ORDER — DIPHENHYDRAMINE HCL 25 MG PO CAPS
25.0000 mg | ORAL_CAPSULE | Freq: Once | ORAL | Status: AC
  Administered 2015-08-23: 25 mg via ORAL
  Filled 2015-08-23: qty 1

## 2015-08-23 MED ORDER — SODIUM CHLORIDE 0.9 % IV SOLN
375.0000 mg/m2 | Freq: Once | INTRAVENOUS | Status: AC
  Administered 2015-08-23: 700 mg via INTRAVENOUS
  Filled 2015-08-23: qty 60

## 2015-08-23 MED ORDER — SODIUM CHLORIDE 0.9 % IV SOLN
10.0000 mg | Freq: Once | INTRAVENOUS | Status: AC
  Administered 2015-08-23: 10 mg via INTRAVENOUS
  Filled 2015-08-23: qty 1

## 2015-08-23 MED ORDER — SODIUM CHLORIDE 0.9 % IV SOLN: 375.0000 mg/m2 | Freq: Once | INTRAVENOUS | Status: DC

## 2015-08-23 MED ORDER — SODIUM CHLORIDE 0.9 % IV SOLN
Freq: Once | INTRAVENOUS | Status: AC
  Administered 2015-08-23: 11:00:00 via INTRAVENOUS
  Filled 2015-08-23: qty 1000

## 2015-08-23 MED ORDER — ACETAMINOPHEN 325 MG PO TABS
650.0000 mg | ORAL_TABLET | Freq: Once | ORAL | Status: AC
Start: 2015-08-23 — End: 2015-08-23
  Administered 2015-08-23: 650 mg via ORAL
  Filled 2015-08-23: qty 2

## 2015-08-23 NOTE — Progress Notes (Signed)
Patient finished her Prednisone a week ago.  4 days ago she started having an increase in fatigue and easy bruising.

## 2015-08-23 NOTE — Progress Notes (Signed)
Plt =37.  MD ok with treatment today

## 2015-08-26 ENCOUNTER — Other Ambulatory Visit: Payer: Self-pay

## 2015-08-26 ENCOUNTER — Inpatient Hospital Stay: Payer: Commercial Managed Care - HMO

## 2015-08-26 DIAGNOSIS — F17210 Nicotine dependence, cigarettes, uncomplicated: Secondary | ICD-10-CM | POA: Diagnosis not present

## 2015-08-26 DIAGNOSIS — I10 Essential (primary) hypertension: Secondary | ICD-10-CM | POA: Diagnosis not present

## 2015-08-26 DIAGNOSIS — D696 Thrombocytopenia, unspecified: Secondary | ICD-10-CM

## 2015-08-26 DIAGNOSIS — Z5112 Encounter for antineoplastic immunotherapy: Secondary | ICD-10-CM | POA: Diagnosis not present

## 2015-08-26 DIAGNOSIS — D693 Immune thrombocytopenic purpura: Secondary | ICD-10-CM

## 2015-08-26 DIAGNOSIS — Z79899 Other long term (current) drug therapy: Secondary | ICD-10-CM | POA: Diagnosis not present

## 2015-08-26 LAB — CBC WITH DIFFERENTIAL/PLATELET
BASOS ABS: 0.1 10*3/uL (ref 0–0.1)
Eosinophils Absolute: 0.1 10*3/uL (ref 0–0.7)
Eosinophils Relative: 1 %
HEMATOCRIT: 41.6 % (ref 35.0–47.0)
Hemoglobin: 14.2 g/dL (ref 12.0–16.0)
Lymphs Abs: 1.7 10*3/uL (ref 1.0–3.6)
MCH: 28.7 pg (ref 26.0–34.0)
MCHC: 34.2 g/dL (ref 32.0–36.0)
MCV: 84 fL (ref 80.0–100.0)
MONO ABS: 0.4 10*3/uL (ref 0.2–0.9)
Monocytes Relative: 6 %
NEUTROS ABS: 5.2 10*3/uL (ref 1.4–6.5)
Platelets: 20 10*3/uL — CL (ref 150–440)
RBC: 4.95 MIL/uL (ref 3.80–5.20)
RDW: 15.3 % — AB (ref 11.5–14.5)
WBC: 7.4 10*3/uL (ref 3.6–11.0)

## 2015-08-26 LAB — SAMPLE TO BLOOD BANK

## 2015-08-26 MED ORDER — PREDNISONE 20 MG PO TABS: 20.0000 mg | ORAL_TABLET | Freq: Every day | ORAL | Status: DC

## 2015-08-26 NOTE — Progress Notes (Signed)
Received call from [facility] regarding patient's platelet count estimate 19-20.  Discussed with Dr. Grayland Ormond that recommends sending rx for Prednisone 20mg  1QD #30 and will discuss in more detail at next weeks appt, rx sent to [facility].  Patient is waiting for lab results today so I informed her of lab results and MD recommendations she is going to pharmacy to get rx when she leaves here.

## 2015-08-28 NOTE — Progress Notes (Signed)
[facility]  Telephone:(336) [phone] Fax:(336) [phone]  ID: Erica Bartlett OB: 1955/12/13  MR#: [id number]  EPP#:[id number]  Patient Care Team: Arlis Porta., MD as PCP - General (Family Medicine)  CHIEF COMPLAINT:  Chief Complaint  Patient presents with  . ITP    INTERVAL HISTORY: Patient returns to clinic today for repeat laboratory work and consideration of cycle 3 of 4 of weekly Rituxan. She completed her prednisone taper several days ago and has noticed an increase in fatigue as well as increased bruising. She denies any recent fevers or illnesses. She has no neurologic complaints. She denies any chest pain or shortness of breath. She has no nausea, vomiting, constipation, or diarrhea. She has no urinary complaints. Patient offers no further specific complaints today.  REVIEW OF SYSTEMS:   Review of Systems  Constitutional: Positive for malaise/fatigue. Negative for fever.  Respiratory: Negative for shortness of breath.   Cardiovascular: Negative.  Negative for chest pain.  Gastrointestinal: Negative.  Negative for blood in stool and melena.  Genitourinary: Negative.  Negative for hematuria.  Neurological: Positive for weakness.  Endo/Heme/Allergies: Bruises/bleeds easily.  Psychiatric/Behavioral: The patient is nervous/anxious.     As per HPI. Otherwise, a complete review of systems is negatve.  PAST MEDICAL HISTORY: Past Medical History  Diagnosis Date  . Hypertension     PAST SURGICAL HISTORY: Past Surgical History  Procedure Laterality Date  . Cesarean section    . Cyst removed      breast, benign  . Abdominal hysterectomy      FAMILY HISTORY: Reviewed and unchanged. No reported history of malignancy or chronic disease.     ADVANCED DIRECTIVES:    HEALTH MAINTENANCE: Social History  Substance Use Topics  . Smoking status: Current Some Day Smoker  . Smokeless tobacco: Never Used  . Alcohol Use: No    Not on  File  Current Outpatient Prescriptions  Medication Sig Dispense Refill  . acetaminophen (TYLENOL) 500 MG tablet Take 500 mg by mouth every 6 (six) hours as needed.    Marland Kitchen albuterol (PROVENTIL HFA;VENTOLIN HFA) 108 (90 BASE) MCG/ACT inhaler Inhale 2 puffs into the lungs every 6 (six) hours as needed for wheezing or shortness of breath. 1 Inhaler 3  . amLODipine (NORVASC) 10 MG tablet Take 1 tablet (10 mg total) by mouth daily. 30 tablet 6  . clonazePAM (KLONOPIN) 1 MG tablet Take 1-2 mg by mouth 3 (three) times daily as needed.     . cyclobenzaprine (FLEXERIL) 10 MG tablet Take 1 tablet (10 mg total) by mouth 3 (three) times daily as needed for muscle spasms. 30 tablet 1  . hydrochlorothiazide (HYDRODIURIL) 25 MG tablet Take 1 tablet (25 mg total) by mouth daily. 30 tablet 12  . pregabalin (LYRICA) 75 MG capsule Take 1 capsule (75 mg total) by mouth 3 (three) times daily. 90 capsule 5  . predniSONE (DELTASONE) 20 MG tablet Take 1 tablet (20 mg total) by mouth daily with breakfast. 30 tablet 0   No current facility-administered medications for this visit.    OBJECTIVE: Filed Vitals:   08/23/15 0942  BP: 147/85  Pulse: 72  Temp: 96.6 F (35.9 C)  Resp: 16     Body mass index is 31.71 kg/(m^2).    ECOG FS:0 - Asymptomatic  General: Well-developed, well-nourished, no acute distress. Eyes: Pink conjunctiva, anicteric sclera. Lungs: Clear to auscultation bilaterally. Heart: Regular rate and rhythm. No rubs, murmurs, or gallops. Abdomen: Soft, nontender, nondistended. No organomegaly noted, normoactive  bowel sounds. Musculoskeletal: No edema, cyanosis, or clubbing. Neuro: Alert, answering all questions appropriately. Cranial nerves grossly intact. Skin: No rashes or petechiae noted. Psych: Normal affect.   LAB RESULTS:  Lab Results  Component Value Date   NA 140 08/04/2015   K 3.5 08/04/2015   CL 95* 08/04/2015   CO2 21 08/04/2015   GLUCOSE 91 08/04/2015   BUN 17 08/04/2015    CREATININE 0.98 08/04/2015   CALCIUM 9.3 08/04/2015   PROT 7.7 07/27/2015   ALBUMIN 3.9 07/27/2015   AST 36 07/27/2015   ALT 34 07/27/2015   ALKPHOS 81 07/27/2015   BILITOT 0.3 07/27/2015   GFRNONAA 64 08/04/2015   GFRAA 74 08/04/2015    Lab Results  Component Value Date   WBC 7.4 08/26/2015   NEUTROABS 5.2 08/26/2015   HGB 14.2 08/26/2015   HCT 41.6 08/26/2015   MCV 84.0 08/26/2015   PLT 20* 08/26/2015     STUDIES: No results found.  ASSESSMENT: Bone marrow biopsy proven ITP.  PLAN:    1. ITP: Bone marrow biopsy performed on July 28, 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 Patient had a minimal response to prednisone increasing her platelet count into the 50s, but not as robust as hoped. Patient has now completed her prednisone taper. Unclear if her decreasing platelet is related to this or not. Proceed with cycle 3 of weekly Rituxan today. Patient will return later this week for repeat platelet count and will consider reinitiating prednisone at that time if her platelets continue to decline. She will then return to clinic in 1 week for consideration of cycle 4. Because of patient's also reaction during cycle 1, she will be given 10 mg Decadron with the remainder of her infusions as a premed.  Patient expressed understanding and was in agreement with this plan. She also understands that She can call clinic at any time with any questions, concerns, or complaints.   Lloyd Huger, MD   08/28/2015 8:33 AM

## 2015-08-30 ENCOUNTER — Inpatient Hospital Stay: Payer: Commercial Managed Care - HMO

## 2015-08-30 ENCOUNTER — Inpatient Hospital Stay (HOSPITAL_BASED_OUTPATIENT_CLINIC_OR_DEPARTMENT_OTHER): Payer: Commercial Managed Care - HMO | Admitting: Oncology

## 2015-08-30 ENCOUNTER — Telehealth: Payer: Self-pay | Admitting: *Deleted

## 2015-08-30 VITALS — BP 114/71 | HR 73

## 2015-08-30 VITALS — BP 161/94 | Temp 97.0°F | Wt 182.1 lb

## 2015-08-30 DIAGNOSIS — Z79899 Other long term (current) drug therapy: Secondary | ICD-10-CM | POA: Diagnosis not present

## 2015-08-30 DIAGNOSIS — F17210 Nicotine dependence, cigarettes, uncomplicated: Secondary | ICD-10-CM | POA: Diagnosis not present

## 2015-08-30 DIAGNOSIS — D693 Immune thrombocytopenic purpura: Secondary | ICD-10-CM | POA: Diagnosis not present

## 2015-08-30 DIAGNOSIS — I10 Essential (primary) hypertension: Secondary | ICD-10-CM | POA: Diagnosis not present

## 2015-08-30 DIAGNOSIS — Z5112 Encounter for antineoplastic immunotherapy: Secondary | ICD-10-CM | POA: Diagnosis not present

## 2015-08-30 DIAGNOSIS — D696 Thrombocytopenia, unspecified: Secondary | ICD-10-CM

## 2015-08-30 LAB — CBC WITH DIFFERENTIAL/PLATELET
Basophils Absolute: 0.1 10*3/uL (ref 0–0.1)
Basophils Relative: 1 %
Eosinophils Absolute: 0 10*3/uL (ref 0–0.7)
Eosinophils Relative: 0 %
HEMATOCRIT: 42.7 % (ref 35.0–47.0)
Hemoglobin: 14.6 g/dL (ref 12.0–16.0)
Lymphs Abs: 2.3 10*3/uL (ref 1.0–3.6)
MCH: 28.7 pg (ref 26.0–34.0)
MCHC: 34.2 g/dL (ref 32.0–36.0)
MCV: 84 fL (ref 80.0–100.0)
MONO ABS: 0.5 10*3/uL (ref 0.2–0.9)
NEUTROS ABS: 7 10*3/uL — AB (ref 1.4–6.5)
Neutrophils Relative %: 71 %
Platelets: 15 10*3/uL — CL (ref 150–440)
RBC: 5.08 MIL/uL (ref 3.80–5.20)
RDW: 15 % — AB (ref 11.5–14.5)
WBC: 9.9 10*3/uL (ref 3.6–11.0)

## 2015-08-30 LAB — SAMPLE TO BLOOD BANK

## 2015-08-30 MED ORDER — SODIUM CHLORIDE 0.9 % IV SOLN
Freq: Once | INTRAVENOUS | Status: AC
  Administered 2015-08-30: 11:00:00 via INTRAVENOUS
  Filled 2015-08-30: qty 1000

## 2015-08-30 MED ORDER — SODIUM CHLORIDE 0.9 % IV SOLN: 375.0000 mg/m2 | Freq: Once | INTRAVENOUS | Status: DC

## 2015-08-30 MED ORDER — ACETAMINOPHEN 325 MG PO TABS
650.0000 mg | ORAL_TABLET | Freq: Once | ORAL | Status: AC
  Administered 2015-08-30: 650 mg via ORAL
  Filled 2015-08-30: qty 2

## 2015-08-30 MED ORDER — DIPHENHYDRAMINE HCL 25 MG PO CAPS
25.0000 mg | ORAL_CAPSULE | Freq: Once | ORAL | Status: AC
  Administered 2015-08-30: 25 mg via ORAL
  Filled 2015-08-30: qty 1

## 2015-08-30 MED ORDER — SODIUM CHLORIDE 0.9 % IV SOLN
375.0000 mg/m2 | Freq: Once | INTRAVENOUS | Status: AC
  Administered 2015-08-30: 700 mg via INTRAVENOUS
  Filled 2015-08-30: qty 60

## 2015-08-30 MED ORDER — SODIUM CHLORIDE 0.9 % IV SOLN
10.0000 mg | Freq: Once | INTRAVENOUS | Status: AC
  Administered 2015-08-30: 10 mg via INTRAVENOUS
  Filled 2015-08-30: qty 1

## 2015-08-30 NOTE — Progress Notes (Signed)
Patient has noticed easy bruising but no bleeding.  Her bp is elevated today at 161/94 but she is has a lot of stressful situations at home and did not take her bp med today.

## 2015-08-30 NOTE — Telephone Encounter (Signed)
Critical Platelets - 15  MD notified.

## 2015-09-06 NOTE — Progress Notes (Signed)
[place]  Telephone:(336) [phone] Fax:(336) [phone]  ID: Erica Bartlett OB: 1955-09-21  MR#: [id number]  VQM#:[id number]  Patient Care Team: Arlis Porta., MD as PCP - General (Family Medicine)  CHIEF COMPLAINT:  Chief Complaint  Patient presents with  . ITP    INTERVAL HISTORY: Patient returns to clinic today for repeat laboratory work and consideration of cycle 4 of 4 of weekly Rituxan. She  Restarted prednisone over the weekend. She continues to have increased bruising. She denies any recent fevers or illnesses. She has no neurologic complaints. She denies any chest pain or shortness of breath. She has no nausea, vomiting, constipation, or diarrhea. She has no urinary complaints. Patient offers no further specific complaints today.  REVIEW OF SYSTEMS:   Review of Systems  Constitutional: Negative for fever, weight loss and malaise/fatigue.  Respiratory: Negative for shortness of breath.   Cardiovascular: Negative.  Negative for chest pain.  Gastrointestinal: Negative.  Negative for blood in stool and melena.  Genitourinary: Negative.  Negative for hematuria.  Neurological: Negative for weakness.  Endo/Heme/Allergies: Bruises/bleeds easily.  Psychiatric/Behavioral: The patient is nervous/anxious.     As per HPI. Otherwise, a complete review of systems is negatve.  PAST MEDICAL HISTORY: Past Medical History  Diagnosis Date  . Hypertension     PAST SURGICAL HISTORY: Past Surgical History  Procedure Laterality Date  . Cesarean section    . Cyst removed      breast, benign  . Abdominal hysterectomy      FAMILY HISTORY: Reviewed and unchanged. No reported history of malignancy or chronic disease.     ADVANCED DIRECTIVES:    HEALTH MAINTENANCE: Social History  Substance Use Topics  . Smoking status: Current Some Day Smoker  . Smokeless tobacco: Never Used  . Alcohol Use: No    Not on File  Current Outpatient Prescriptions    Medication Sig Dispense Refill  . acetaminophen (TYLENOL) 500 MG tablet Take 500 mg by mouth every 6 (six) hours as needed.    Marland Kitchen albuterol (PROVENTIL HFA;VENTOLIN HFA) 108 (90 BASE) MCG/ACT inhaler Inhale 2 puffs into the lungs every 6 (six) hours as needed for wheezing or shortness of breath. 1 Inhaler 3  . amLODipine (NORVASC) 10 MG tablet Take 1 tablet (10 mg total) by mouth daily. 30 tablet 6  . clonazePAM (KLONOPIN) 1 MG tablet Take 1-2 mg by mouth 3 (three) times daily as needed.     . cyclobenzaprine (FLEXERIL) 10 MG tablet Take 1 tablet (10 mg total) by mouth 3 (three) times daily as needed for muscle spasms. 30 tablet 1  . hydrochlorothiazide (HYDRODIURIL) 25 MG tablet Take 1 tablet (25 mg total) by mouth daily. 30 tablet 12  . predniSONE (DELTASONE) 20 MG tablet Take 1 tablet (20 mg total) by mouth daily with breakfast. 30 tablet 0  . pregabalin (LYRICA) 75 MG capsule Take 1 capsule (75 mg total) by mouth 3 (three) times daily. 90 capsule 5   No current facility-administered medications for this visit.    OBJECTIVE: Filed Vitals:   08/30/15 0948  BP: 161/94  Temp: 97 F (36.1 C)     Body mass index is 31.75 kg/(m^2).    ECOG FS:0 - Asymptomatic  General: Well-developed, well-nourished, no acute distress. Eyes: Pink conjunctiva, anicteric sclera. Lungs: Clear to auscultation bilaterally. Heart: Regular rate and rhythm. No rubs, murmurs, or gallops. Abdomen: Soft, nontender, nondistended. No organomegaly noted, normoactive bowel sounds. Musculoskeletal: No edema, cyanosis, or clubbing. Neuro: Alert, answering  all questions appropriately. Cranial nerves grossly intact. Skin: No rashes or petechiae noted. Psych: Normal affect.   LAB RESULTS:  Lab Results  Component Value Date   NA 140 08/04/2015   K 3.5 08/04/2015   CL 95* 08/04/2015   CO2 21 08/04/2015   GLUCOSE 91 08/04/2015   BUN 17 08/04/2015   CREATININE 0.98 08/04/2015   CALCIUM 9.3 08/04/2015   PROT 7.7  07/27/2015   ALBUMIN 3.9 07/27/2015   AST 36 07/27/2015   ALT 34 07/27/2015   ALKPHOS 81 07/27/2015   BILITOT 0.3 07/27/2015   GFRNONAA 64 08/04/2015   GFRAA 74 08/04/2015    Lab Results  Component Value Date   WBC 9.9 08/30/2015   NEUTROABS 7.0* 08/30/2015   HGB 14.6 08/30/2015   HCT 42.7 08/30/2015   MCV 84.0 08/30/2015   PLT 15* 08/30/2015     STUDIES: No results found.  ASSESSMENT: Bone marrow biopsy proven ITP.  PLAN:    1. ITP: Bone marrow biopsy performed on July 28, 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 Patient had a minimal response to prednisone increasing her platelet count into the 50s, but not as robust as hoped.  Because patient's platelet count his declining, prednisone has be reinitiated at 20 mg daily. Proceed with cycle 4 of weekly Rituxan today.  Return to clinic in 1 week with repeat platelet count and further evaluation. If there is no appreciable change in her counts, will consider IVIG. Because of patient's also reaction during cycle 1, she will be given 10 mg Decadron with the remainder of her infusions as a premed.  Patient expressed understanding and was in agreement with this plan. She also understands that She can call clinic at any time with any questions, concerns, or complaints.   Lloyd Huger, MD   09/06/2015 11:32 AM

## 2015-09-07 ENCOUNTER — Inpatient Hospital Stay: Payer: Commercial Managed Care - HMO

## 2015-09-07 ENCOUNTER — Inpatient Hospital Stay (HOSPITAL_BASED_OUTPATIENT_CLINIC_OR_DEPARTMENT_OTHER): Payer: Commercial Managed Care - HMO | Admitting: Oncology

## 2015-09-07 DIAGNOSIS — D696 Thrombocytopenia, unspecified: Secondary | ICD-10-CM

## 2015-09-07 DIAGNOSIS — Z79899 Other long term (current) drug therapy: Secondary | ICD-10-CM

## 2015-09-07 DIAGNOSIS — F17210 Nicotine dependence, cigarettes, uncomplicated: Secondary | ICD-10-CM | POA: Diagnosis not present

## 2015-09-07 DIAGNOSIS — Z5112 Encounter for antineoplastic immunotherapy: Secondary | ICD-10-CM | POA: Diagnosis not present

## 2015-09-07 DIAGNOSIS — D693 Immune thrombocytopenic purpura: Secondary | ICD-10-CM

## 2015-09-07 DIAGNOSIS — I10 Essential (primary) hypertension: Secondary | ICD-10-CM | POA: Diagnosis not present

## 2015-09-07 LAB — CBC WITH DIFFERENTIAL/PLATELET
BASOS PCT: 1 %
Basophils Absolute: 0.1 10*3/uL (ref 0–0.1)
EOS ABS: 0 10*3/uL (ref 0–0.7)
Eosinophils Relative: 0 %
HCT: 42.6 % (ref 35.0–47.0)
Hemoglobin: 14.7 g/dL (ref 12.0–16.0)
Lymphocytes Relative: 21 %
Lymphs Abs: 2.9 10*3/uL (ref 1.0–3.6)
MCH: 29 pg (ref 26.0–34.0)
MCHC: 34.4 g/dL (ref 32.0–36.0)
MCV: 84.1 fL (ref 80.0–100.0)
MONO ABS: 0.6 10*3/uL (ref 0.2–0.9)
MONOS PCT: 5 %
NEUTROS ABS: 10.3 10*3/uL — AB (ref 1.4–6.5)
Neutrophils Relative %: 73 %
Platelets: 70 10*3/uL — ABNORMAL LOW (ref 150–440)
RBC: 5.07 MIL/uL (ref 3.80–5.20)
RDW: 14.9 % — AB (ref 11.5–14.5)
WBC: 13.9 10*3/uL — ABNORMAL HIGH (ref 3.6–11.0)

## 2015-09-07 LAB — SAMPLE TO BLOOD BANK

## 2015-09-07 MED ORDER — PREDNISONE 20 MG PO TABS: 40.0000 mg | ORAL_TABLET | Freq: Every day | ORAL | Status: DC

## 2015-09-07 NOTE — Progress Notes (Signed)
Patient is feeling fatigued. 

## 2015-09-12 NOTE — Progress Notes (Signed)
[place]  Telephone:([phone] Fax:(336) [phone]  ID: Erica Bartlett OB: [birth year]  MR#: [id number]  LKJ#:[id number]  Patient Care Team: Arlis Porta., MD as PCP - General (Family Medicine)  CHIEF COMPLAINT:  Chief Complaint  Patient presents with  . ITP    INTERVAL HISTORY: Patient returns to clinic today for repeat laboratory work and further evaluation. She currently feels well and is asymptomatic. She is tolerating 40 mg prednisone without significant side effects. She does not complain of easy bleeding or bruising today. She denies any recent fevers or illnesses. She has no neurologic complaints. She denies any chest pain or shortness of breath. She has no nausea, vomiting, constipation, or diarrhea. She has no urinary complaints. Patient offers no further specific complaints today.  REVIEW OF SYSTEMS:   Review of Systems  Constitutional: Negative for fever, weight loss and malaise/fatigue.  Respiratory: Negative for shortness of breath.   Cardiovascular: Negative.  Negative for chest pain.  Gastrointestinal: Negative.  Negative for blood in stool and melena.  Genitourinary: Negative.  Negative for hematuria.  Neurological: Negative for weakness.  Endo/Heme/Allergies: Does not bruise/bleed easily.  Psychiatric/Behavioral: The patient is nervous/anxious.     As per HPI. Otherwise, a complete review of systems is negatve.  PAST MEDICAL HISTORY: Past Medical History  Diagnosis Date  . Hypertension     PAST SURGICAL HISTORY: Past Surgical History  Procedure Laterality Date  . Cesarean section    . Cyst removed      breast, benign  . Abdominal hysterectomy      FAMILY HISTORY: Reviewed and unchanged. No reported history of malignancy or chronic disease.     ADVANCED DIRECTIVES:    HEALTH MAINTENANCE: Social History  Substance Use Topics  . Smoking status: Current Some Day Smoker  . Smokeless tobacco: Never Used  . Alcohol  Use: No    No Known Allergies  Current Outpatient Prescriptions  Medication Sig Dispense Refill  . acetaminophen (TYLENOL) 500 MG tablet Take 500 mg by mouth every 6 (six) hours as needed.    Marland Kitchen albuterol (PROVENTIL HFA;VENTOLIN HFA) 108 (90 BASE) MCG/ACT inhaler Inhale 2 puffs into the lungs every 6 (six) hours as needed for wheezing or shortness of breath. 1 Inhaler 3  . amLODipine (NORVASC) 10 MG tablet Take 1 tablet (10 mg total) by mouth daily. 30 tablet 6  . clonazePAM (KLONOPIN) 1 MG tablet Take 1-2 mg by mouth 3 (three) times daily as needed.     . cyclobenzaprine (FLEXERIL) 10 MG tablet Take 1 tablet (10 mg total) by mouth 3 (three) times daily as needed for muscle spasms. 30 tablet 1  . hydrochlorothiazide (HYDRODIURIL) 25 MG tablet Take 1 tablet (25 mg total) by mouth daily. 30 tablet 12  . predniSONE (DELTASONE) 20 MG tablet Take 2 tablets (40 mg total) by mouth daily with breakfast. 2 QAM 30 tablet 0  . pregabalin (LYRICA) 75 MG capsule Take 1 capsule (75 mg total) by mouth 3 (three) times daily. 90 capsule 5   No current facility-administered medications for this visit.    OBJECTIVE: Filed Vitals:   09/07/15 0916  BP: 128/80  Pulse: 69  Temp: 96.1 F (35.6 C)  Resp: 18     Body mass index is 31.86 kg/(m^2).    ECOG FS:0 - Asymptomatic  General: Well-developed, well-nourished, no acute distress. Eyes: Pink conjunctiva, anicteric sclera. Lungs: Clear to auscultation bilaterally. Heart: Regular rate and rhythm. No rubs, murmurs, or gallops. Abdomen: Soft, nontender,  nondistended. No organomegaly noted, normoactive bowel sounds. Musculoskeletal: No edema, cyanosis, or clubbing. Neuro: Alert, answering all questions appropriately. Cranial nerves grossly intact. Skin: No rashes or petechiae noted. Psych: Normal affect.   LAB RESULTS:  Lab Results  Component Value Date   NA 140 08/04/2015   K 3.5 08/04/2015   CL 95* 08/04/2015   CO2 21 08/04/2015   GLUCOSE 91  08/04/2015   BUN 17 08/04/2015   CREATININE 0.98 08/04/2015   CALCIUM 9.3 08/04/2015   PROT 7.7 07/27/2015   ALBUMIN 3.9 07/27/2015   AST 36 07/27/2015   ALT 34 07/27/2015   ALKPHOS 81 07/27/2015   BILITOT 0.3 07/27/2015   GFRNONAA 64 08/04/2015   GFRAA 74 08/04/2015    Lab Results  Component Value Date   WBC 13.9* 09/07/2015   NEUTROABS 10.3* 09/07/2015   HGB 14.7 09/07/2015   HCT 42.6 09/07/2015   MCV 84.1 09/07/2015   PLT 70* 09/07/2015     STUDIES: No results found.  ASSESSMENT: Bone marrow biopsy proven ITP.  PLAN:    1. ITP: Bone marrow biopsy performed on July 28, 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 Patient's platelet count has improved to 70 today, therefore will discontinue Rituxan and not initiate IVIG. She has been instructed to continue prednisone 40 mg daily. Return to clinic in 1 week for repeat laboratory work and then in 2 weeks for repeat laboratory work and further evaluation.  Because of patient's also reaction during cycle 1, she will be given 10 mg Decadron with the remainder of her infusions as a premed.  Approximately 30 minutes spent in discussion and of which greater than 50% was consultation.   Patient expressed understanding and was in agreement with this plan. She also understands that She can call clinic at any time with any questions, concerns, or complaints.   Lloyd Huger, MD   09/12/2015 8:17 AM

## 2015-09-14 ENCOUNTER — Telehealth: Payer: Self-pay

## 2015-09-14 ENCOUNTER — Inpatient Hospital Stay: Payer: Commercial Managed Care - HMO

## 2015-09-14 DIAGNOSIS — I10 Essential (primary) hypertension: Secondary | ICD-10-CM | POA: Diagnosis not present

## 2015-09-14 DIAGNOSIS — D693 Immune thrombocytopenic purpura: Secondary | ICD-10-CM | POA: Diagnosis not present

## 2015-09-14 DIAGNOSIS — F17210 Nicotine dependence, cigarettes, uncomplicated: Secondary | ICD-10-CM | POA: Diagnosis not present

## 2015-09-14 DIAGNOSIS — Z79899 Other long term (current) drug therapy: Secondary | ICD-10-CM | POA: Diagnosis not present

## 2015-09-14 DIAGNOSIS — Z5112 Encounter for antineoplastic immunotherapy: Secondary | ICD-10-CM | POA: Diagnosis not present

## 2015-09-14 DIAGNOSIS — D696 Thrombocytopenia, unspecified: Secondary | ICD-10-CM

## 2015-09-14 LAB — CBC WITH DIFFERENTIAL/PLATELET
BASOS ABS: 0.1 10*3/uL (ref 0–0.1)
BASOS PCT: 0 %
EOS ABS: 0 10*3/uL (ref 0–0.7)
Eosinophils Relative: 0 %
HEMATOCRIT: 42.8 % (ref 35.0–47.0)
Hemoglobin: 14.6 g/dL (ref 12.0–16.0)
Lymphocytes Relative: 10 %
Lymphs Abs: 1.3 10*3/uL (ref 1.0–3.6)
MCH: 29 pg (ref 26.0–34.0)
MCHC: 34 g/dL (ref 32.0–36.0)
MCV: 85.2 fL (ref 80.0–100.0)
MONO ABS: 0.4 10*3/uL (ref 0.2–0.9)
Monocytes Relative: 3 %
NEUTROS ABS: 11.4 10*3/uL — AB (ref 1.4–6.5)
NEUTROS PCT: 87 %
Platelets: 68 10*3/uL — ABNORMAL LOW (ref 150–440)
RBC: 5.02 MIL/uL (ref 3.80–5.20)
RDW: 15.4 % — AB (ref 11.5–14.5)
WBC: 13.2 10*3/uL — ABNORMAL HIGH (ref 3.6–11.0)

## 2015-09-14 LAB — SAMPLE TO BLOOD BANK

## 2015-09-14 NOTE — Telephone Encounter (Signed)
Dr. Grayland Ormond informed platelet count results today is 68.  MD advises patient tto decrease Prednisone to 20 mg and will recheck at next weeks appt.  Patient and her daughter aware and agree with plan.

## 2015-09-22 ENCOUNTER — Inpatient Hospital Stay (HOSPITAL_BASED_OUTPATIENT_CLINIC_OR_DEPARTMENT_OTHER): Payer: Commercial Managed Care - HMO | Admitting: Oncology

## 2015-09-22 ENCOUNTER — Inpatient Hospital Stay: Payer: Commercial Managed Care - HMO

## 2015-09-22 VITALS — BP 139/85 | HR 61 | Temp 96.0°F | Resp 18 | Wt 189.2 lb

## 2015-09-22 DIAGNOSIS — I10 Essential (primary) hypertension: Secondary | ICD-10-CM | POA: Diagnosis not present

## 2015-09-22 DIAGNOSIS — Z5112 Encounter for antineoplastic immunotherapy: Secondary | ICD-10-CM | POA: Diagnosis not present

## 2015-09-22 DIAGNOSIS — Z79899 Other long term (current) drug therapy: Secondary | ICD-10-CM

## 2015-09-22 DIAGNOSIS — D693 Immune thrombocytopenic purpura: Secondary | ICD-10-CM

## 2015-09-22 DIAGNOSIS — F17210 Nicotine dependence, cigarettes, uncomplicated: Secondary | ICD-10-CM | POA: Diagnosis not present

## 2015-09-22 DIAGNOSIS — D696 Thrombocytopenia, unspecified: Secondary | ICD-10-CM

## 2015-09-22 LAB — CBC WITH DIFFERENTIAL/PLATELET
BASOS ABS: 0.1 10*3/uL (ref 0–0.1)
Basophils Relative: 1 %
Eosinophils Absolute: 0 10*3/uL (ref 0–0.7)
Eosinophils Relative: 1 %
HEMATOCRIT: 41.2 % (ref 35.0–47.0)
HEMOGLOBIN: 14.1 g/dL (ref 12.0–16.0)
LYMPHS ABS: 2.5 10*3/uL (ref 1.0–3.6)
LYMPHS PCT: 33 %
MCH: 29.2 pg (ref 26.0–34.0)
MCHC: 34.1 g/dL (ref 32.0–36.0)
MCV: 85.7 fL (ref 80.0–100.0)
Monocytes Absolute: 0.4 10*3/uL (ref 0.2–0.9)
Monocytes Relative: 5 %
NEUTROS ABS: 4.6 10*3/uL (ref 1.4–6.5)
NEUTROS PCT: 60 %
Platelets: 46 10*3/uL — ABNORMAL LOW (ref 150–440)
RBC: 4.81 MIL/uL (ref 3.80–5.20)
RDW: 15.4 % — ABNORMAL HIGH (ref 11.5–14.5)
WBC: 7.5 10*3/uL (ref 3.6–11.0)

## 2015-09-22 LAB — SAMPLE TO BLOOD BANK

## 2015-09-22 MED ORDER — PREDNISONE 20 MG PO TABS: 20.0000 mg | ORAL_TABLET | Freq: Every day | ORAL | Status: DC

## 2015-09-22 NOTE — Progress Notes (Signed)
Patient is feeling more fatigued. 

## 2015-09-28 ENCOUNTER — Inpatient Hospital Stay: Payer: Commercial Managed Care - HMO | Attending: Oncology

## 2015-09-28 ENCOUNTER — Other Ambulatory Visit: Payer: Self-pay

## 2015-09-28 ENCOUNTER — Inpatient Hospital Stay (HOSPITAL_BASED_OUTPATIENT_CLINIC_OR_DEPARTMENT_OTHER): Payer: Commercial Managed Care - HMO | Admitting: Oncology

## 2015-09-28 VITALS — BP 139/94 | HR 69 | Temp 95.6°F | Wt 188.5 lb

## 2015-09-28 DIAGNOSIS — D693 Immune thrombocytopenic purpura: Secondary | ICD-10-CM

## 2015-09-28 DIAGNOSIS — F17210 Nicotine dependence, cigarettes, uncomplicated: Secondary | ICD-10-CM | POA: Diagnosis not present

## 2015-09-28 DIAGNOSIS — I10 Essential (primary) hypertension: Secondary | ICD-10-CM | POA: Insufficient documentation

## 2015-09-28 DIAGNOSIS — D696 Thrombocytopenia, unspecified: Secondary | ICD-10-CM

## 2015-09-28 DIAGNOSIS — Z79899 Other long term (current) drug therapy: Secondary | ICD-10-CM | POA: Insufficient documentation

## 2015-09-28 LAB — CBC WITH DIFFERENTIAL/PLATELET
Basophils Absolute: 0.1 10*3/uL (ref 0–0.1)
Basophils Relative: 1 %
EOS ABS: 0 10*3/uL (ref 0–0.7)
Eosinophils Relative: 1 %
HEMATOCRIT: 43.1 % (ref 35.0–47.0)
HEMOGLOBIN: 14.7 g/dL (ref 12.0–16.0)
LYMPHS ABS: 2.4 10*3/uL (ref 1.0–3.6)
Lymphocytes Relative: 30 %
MCH: 29.4 pg (ref 26.0–34.0)
MCHC: 34.2 g/dL (ref 32.0–36.0)
MCV: 85.8 fL (ref 80.0–100.0)
MONOS PCT: 5 %
Monocytes Absolute: 0.4 10*3/uL (ref 0.2–0.9)
NEUTROS ABS: 4.9 10*3/uL (ref 1.4–6.5)
NEUTROS PCT: 63 %
Platelets: 44 10*3/uL — ABNORMAL LOW (ref 150–440)
RBC: 5.02 MIL/uL (ref 3.80–5.20)
RDW: 15.8 % — ABNORMAL HIGH (ref 11.5–14.5)
WBC: 7.8 10*3/uL (ref 3.6–11.0)

## 2015-09-28 LAB — SAMPLE TO BLOOD BANK

## 2015-09-28 NOTE — Progress Notes (Signed)
[place]  Telephone:([phone] Fax:(336) [phone]  ID: Erica Bartlett OB: 1955-06-07  MR#: [id number]  SHF#:[id number]  Patient Care Team: Arlis Porta., MD as PCP - General (Family Medicine)  CHIEF COMPLAINT:  Chief Complaint  Patient presents with  . ITP    INTERVAL HISTORY: Patient returns to clinic today for repeat laboratory work and further evaluation. She currently feels well and is asymptomatic. She is tolerating 40 mg prednisone without significant side effects. She has noticed more bruising, but does not have any bleeding. She denies any recent fevers or illnesses. She has no neurologic complaints. She denies any chest pain or shortness of breath. She has no nausea, vomiting, constipation, or diarrhea. She has no urinary complaints. Patient offers no further specific complaints today.  REVIEW OF SYSTEMS:   Review of Systems  Constitutional: Negative for fever, weight loss and malaise/fatigue.  Respiratory: Negative for shortness of breath.   Cardiovascular: Negative.  Negative for chest pain.  Gastrointestinal: Negative.  Negative for blood in stool and melena.  Genitourinary: Negative.  Negative for hematuria.  Neurological: Negative for weakness.  Endo/Heme/Allergies: Bruises/bleeds easily.  Psychiatric/Behavioral: The patient is nervous/anxious.     As per HPI. Otherwise, a complete review of systems is negatve.  PAST MEDICAL HISTORY: Past Medical History  Diagnosis Date  . Hypertension     PAST SURGICAL HISTORY: Past Surgical History  Procedure Laterality Date  . Cesarean section    . Cyst removed      breast, benign  . Abdominal hysterectomy      FAMILY HISTORY: Reviewed and unchanged. No reported history of malignancy or chronic disease.     ADVANCED DIRECTIVES:    HEALTH MAINTENANCE: Social History  Substance Use Topics  . Smoking status: Current Some Day Smoker  . Smokeless tobacco: Never Used  . Alcohol  Use: No    No Known Allergies  Current Outpatient Prescriptions  Medication Sig Dispense Refill  . acetaminophen (TYLENOL) 500 MG tablet Take 500 mg by mouth every 6 (six) hours as needed.    Marland Kitchen albuterol (PROVENTIL HFA;VENTOLIN HFA) 108 (90 BASE) MCG/ACT inhaler Inhale 2 puffs into the lungs every 6 (six) hours as needed for wheezing or shortness of breath. 1 Inhaler 3  . amLODipine (NORVASC) 10 MG tablet Take 1 tablet (10 mg total) by mouth daily. 30 tablet 6  . clonazePAM (KLONOPIN) 1 MG tablet Take 1-2 mg by mouth 3 (three) times daily as needed.     . cyclobenzaprine (FLEXERIL) 10 MG tablet Take 1 tablet (10 mg total) by mouth 3 (three) times daily as needed for muscle spasms. 30 tablet 1  . hydrochlorothiazide (HYDRODIURIL) 25 MG tablet Take 1 tablet (25 mg total) by mouth daily. 30 tablet 12  . predniSONE (DELTASONE) 20 MG tablet Take 1 tablet (20 mg total) by mouth daily with breakfast. 30 tablet 1  . pregabalin (LYRICA) 75 MG capsule Take 1 capsule (75 mg total) by mouth 3 (three) times daily. 90 capsule 5   No current facility-administered medications for this visit.    OBJECTIVE: Filed Vitals:   09/22/15 1008  BP: 139/85  Pulse: 61  Temp: 96 F (35.6 C)  Resp: 18     Body mass index is 32.98 kg/(m^2).    ECOG FS:0 - Asymptomatic  General: Well-developed, well-nourished, no acute distress. Eyes: Pink conjunctiva, anicteric sclera. Lungs: Clear to auscultation bilaterally. Heart: Regular rate and rhythm. No rubs, murmurs, or gallops. Abdomen: Soft, nontender, nondistended. No organomegaly  noted, normoactive bowel sounds. Musculoskeletal: No edema, cyanosis, or clubbing. Neuro: Alert, answering all questions appropriately. Cranial nerves grossly intact. Skin: No rashes or petechiae noted. Psych: Normal affect.   LAB RESULTS:  Lab Results  Component Value Date   NA 140 08/04/2015   K 3.5 08/04/2015   CL 95* 08/04/2015   CO2 21 08/04/2015   GLUCOSE 91 08/04/2015     BUN 17 08/04/2015   CREATININE 0.98 08/04/2015   CALCIUM 9.3 08/04/2015   PROT 7.7 07/27/2015   ALBUMIN 3.9 07/27/2015   AST 36 07/27/2015   ALT 34 07/27/2015   ALKPHOS 81 07/27/2015   BILITOT 0.3 07/27/2015   GFRNONAA 64 08/04/2015   GFRAA 74 08/04/2015    Lab Results  Component Value Date   WBC 7.5 09/22/2015   NEUTROABS 4.6 09/22/2015   HGB 14.1 09/22/2015   HCT 41.2 09/22/2015   MCV 85.7 09/22/2015   PLT 46* 09/22/2015     STUDIES: No results found.  ASSESSMENT: Bone marrow biopsy proven ITP.  PLAN:    1. ITP: Bone marrow biopsy performed on July 28, 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 Patient's platelet count has decreased to 46 today. We will continue to monitor closely. Patient has been instructed to decrease prednisone to 20 mg daily and return to clinic in 1 week for repeat laboratory work and further evaluation. If her platelet count continues to drop, she may benefit from Nplate.  Because of patient's also reaction during cycle 1, she will be given 10 mg Decadron with the remainder of her infusions as a premed.  Approximately 30 minutes spent in discussion and of which greater than 50% was consultation.   Patient expressed understanding and was in agreement with this plan. She also understands that She can call clinic at any time with any questions, concerns, or complaints.   Lloyd Huger, MD   09/28/2015 9:40 AM

## 2015-09-28 NOTE — Progress Notes (Signed)
Patient does not offer any problems today.  

## 2015-10-03 NOTE — Progress Notes (Signed)
[place]  Telephone:(336) [phone] Fax:(336) [phone]  ID: Erica Bartlett OB: 1956-03-18  MR#: [id number]  PHX#:[id number]  Patient Care Team: Arlis Porta., MD as PCP - General (Family Medicine)  CHIEF COMPLAINT:  Chief Complaint  Patient presents with  . ITP    INTERVAL HISTORY: Patient returns to clinic today for repeat laboratory work and further evaluation. She currently feels well and is asymptomatic. She is tolerating 20 mg prednisone without significant side effects. She Denies any further increased bleeding or bruising. She denies any recent fevers or illnesses. She has no neurologic complaints. She denies any chest pain or shortness of breath. She has no nausea, vomiting, constipation, or diarrhea. She has no urinary complaints. Patient offers no further specific complaints today.  REVIEW OF SYSTEMS:   Review of Systems  Constitutional: Negative for fever, weight loss and malaise/fatigue.  Respiratory: Negative for shortness of breath.   Cardiovascular: Negative.  Negative for chest pain.  Gastrointestinal: Negative.  Negative for blood in stool and melena.  Genitourinary: Negative.  Negative for hematuria.  Neurological: Negative for weakness.  Endo/Heme/Allergies: Bruises/bleeds easily.  Psychiatric/Behavioral: The patient is nervous/anxious.     As per HPI. Otherwise, a complete review of systems is negatve.  PAST MEDICAL HISTORY: Past Medical History  Diagnosis Date  . Hypertension     PAST SURGICAL HISTORY: Past Surgical History  Procedure Laterality Date  . Cesarean section    . Cyst removed      breast, benign  . Abdominal hysterectomy      FAMILY HISTORY: Reviewed and unchanged. No reported history of malignancy or chronic disease.     ADVANCED DIRECTIVES:    HEALTH MAINTENANCE: Social History  Substance Use Topics  . Smoking status: Current Some Day Smoker  . Smokeless tobacco: Never Used  . Alcohol Use: No      No Known Allergies  Current Outpatient Prescriptions  Medication Sig Dispense Refill  . acetaminophen (TYLENOL) 500 MG tablet Take 500 mg by mouth every 6 (six) hours as needed.    Marland Kitchen albuterol (PROVENTIL HFA;VENTOLIN HFA) 108 (90 BASE) MCG/ACT inhaler Inhale 2 puffs into the lungs every 6 (six) hours as needed for wheezing or shortness of breath. 1 Inhaler 3  . amLODipine (NORVASC) 10 MG tablet Take 1 tablet (10 mg total) by mouth daily. 30 tablet 6  . clonazePAM (KLONOPIN) 1 MG tablet Take 1-2 mg by mouth 3 (three) times daily as needed.     . cyclobenzaprine (FLEXERIL) 10 MG tablet Take 1 tablet (10 mg total) by mouth 3 (three) times daily as needed for muscle spasms. 30 tablet 1  . hydrochlorothiazide (HYDRODIURIL) 25 MG tablet Take 1 tablet (25 mg total) by mouth daily. 30 tablet 12  . predniSONE (DELTASONE) 20 MG tablet Take 1 tablet (20 mg total) by mouth daily with breakfast. 30 tablet 1  . pregabalin (LYRICA) 75 MG capsule Take 1 capsule (75 mg total) by mouth 3 (three) times daily. 90 capsule 5   No current facility-administered medications for this visit.    OBJECTIVE: Filed Vitals:   09/28/15 1059  BP: 139/94  Pulse: 69  Temp: 95.6 F (35.3 C)     Body mass index is 32.86 kg/(m^2).    ECOG FS:0 - Asymptomatic  General: Well-developed, well-nourished, no acute distress. Eyes: Pink conjunctiva, anicteric sclera. Lungs: Clear to auscultation bilaterally. Heart: Regular rate and rhythm. No rubs, murmurs, or gallops. Abdomen: Soft, nontender, nondistended. No organomegaly noted, normoactive bowel sounds. Musculoskeletal:  No edema, cyanosis, or clubbing. Neuro: Alert, answering all questions appropriately. Cranial nerves grossly intact. Skin: No rashes or petechiae noted. Psych: Normal affect.   LAB RESULTS:  Lab Results  Component Value Date   NA 140 08/04/2015   K 3.5 08/04/2015   CL 95* 08/04/2015   CO2 21 08/04/2015   GLUCOSE 91 08/04/2015   BUN 17  08/04/2015   CREATININE 0.98 08/04/2015   CALCIUM 9.3 08/04/2015   PROT 7.7 07/27/2015   ALBUMIN 3.9 07/27/2015   AST 36 07/27/2015   ALT 34 07/27/2015   ALKPHOS 81 07/27/2015   BILITOT 0.3 07/27/2015   GFRNONAA 64 08/04/2015   GFRAA 74 08/04/2015    Lab Results  Component Value Date   WBC 7.8 09/28/2015   NEUTROABS 4.9 09/28/2015   HGB 14.7 09/28/2015   HCT 43.1 09/28/2015   MCV 85.8 09/28/2015   PLT 44* 09/28/2015     STUDIES: No results found.  ASSESSMENT: Bone marrow biopsy proven ITP.  PLAN:    1. ITP: Bone marrow biopsy performed on July 28, 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 Patient's platelet count Is stable at 44 today. Continue to monitor closely. Patient has been instructed to decrease prednisone to 10 mg daily and return to clinic in 1 week for repeat laboratory work and then in 2 weeks for laboratory work and further evaluation. If her platelet count continues to drop, she may benefit from Nplate.   Patient expressed understanding and was in agreement with this plan. She also understands that She can call clinic at any time with any questions, concerns, or complaints.   Lloyd Huger, MD   10/03/2015 7:55 AM

## 2015-10-04 ENCOUNTER — Inpatient Hospital Stay: Payer: Commercial Managed Care - HMO

## 2015-10-04 ENCOUNTER — Other Ambulatory Visit: Payer: Self-pay | Admitting: Oncology

## 2015-10-04 DIAGNOSIS — D696 Thrombocytopenia, unspecified: Secondary | ICD-10-CM

## 2015-10-04 DIAGNOSIS — D693 Immune thrombocytopenic purpura: Secondary | ICD-10-CM

## 2015-10-04 DIAGNOSIS — I10 Essential (primary) hypertension: Secondary | ICD-10-CM | POA: Diagnosis not present

## 2015-10-04 DIAGNOSIS — Z79899 Other long term (current) drug therapy: Secondary | ICD-10-CM | POA: Diagnosis not present

## 2015-10-04 DIAGNOSIS — F17210 Nicotine dependence, cigarettes, uncomplicated: Secondary | ICD-10-CM | POA: Diagnosis not present

## 2015-10-04 LAB — CBC WITH DIFFERENTIAL/PLATELET
BASOS PCT: 1 %
Basophils Absolute: 0.1 10*3/uL (ref 0–0.1)
EOS ABS: 0.1 10*3/uL (ref 0–0.7)
EOS PCT: 1 %
HCT: 43.5 % (ref 35.0–47.0)
Hemoglobin: 15 g/dL (ref 12.0–16.0)
LYMPHS ABS: 2.5 10*3/uL (ref 1.0–3.6)
Lymphocytes Relative: 27 %
MCH: 29.4 pg (ref 26.0–34.0)
MCHC: 34.5 g/dL (ref 32.0–36.0)
MCV: 85.3 fL (ref 80.0–100.0)
MONOS PCT: 5 %
Monocytes Absolute: 0.5 10*3/uL (ref 0.2–0.9)
Neutro Abs: 6 10*3/uL (ref 1.4–6.5)
Neutrophils Relative %: 66 %
PLATELETS: 56 10*3/uL — AB (ref 150–440)
RBC: 5.1 MIL/uL (ref 3.80–5.20)
RDW: 15.3 % — ABNORMAL HIGH (ref 11.5–14.5)
WBC: 9.2 10*3/uL (ref 3.6–11.0)

## 2015-10-04 LAB — SAMPLE TO BLOOD BANK

## 2015-10-04 MED ORDER — PREDNISONE 20 MG PO TABS
5.0000 mg | ORAL_TABLET | Freq: Every day | ORAL | Status: DC
Start: 2015-10-05 — End: 2015-10-04

## 2015-10-04 MED ORDER — PREDNISONE 5 MG PO TABS: 5.0000 mg | ORAL_TABLET | Freq: Every day | ORAL | Status: DC

## 2015-10-11 ENCOUNTER — Inpatient Hospital Stay: Payer: Commercial Managed Care - HMO

## 2015-10-11 ENCOUNTER — Inpatient Hospital Stay (HOSPITAL_BASED_OUTPATIENT_CLINIC_OR_DEPARTMENT_OTHER): Payer: Commercial Managed Care - HMO | Admitting: Oncology

## 2015-10-11 VITALS — BP 117/82 | HR 76 | Temp 96.0°F | Resp 18 | Wt 187.9 lb

## 2015-10-11 DIAGNOSIS — I10 Essential (primary) hypertension: Secondary | ICD-10-CM | POA: Diagnosis not present

## 2015-10-11 DIAGNOSIS — Z79899 Other long term (current) drug therapy: Secondary | ICD-10-CM

## 2015-10-11 DIAGNOSIS — F17210 Nicotine dependence, cigarettes, uncomplicated: Secondary | ICD-10-CM

## 2015-10-11 DIAGNOSIS — D693 Immune thrombocytopenic purpura: Secondary | ICD-10-CM | POA: Diagnosis not present

## 2015-10-11 DIAGNOSIS — D696 Thrombocytopenia, unspecified: Secondary | ICD-10-CM

## 2015-10-11 LAB — CBC WITH DIFFERENTIAL/PLATELET
BASOS ABS: 0.1 10*3/uL (ref 0–0.1)
BASOS PCT: 1 %
EOS PCT: 1 %
Eosinophils Absolute: 0.1 10*3/uL (ref 0–0.7)
HEMATOCRIT: 43.2 % (ref 35.0–47.0)
Hemoglobin: 14.8 g/dL (ref 12.0–16.0)
Lymphocytes Relative: 24 %
Lymphs Abs: 1.8 10*3/uL (ref 1.0–3.6)
MCH: 29.2 pg (ref 26.0–34.0)
MCHC: 34.4 g/dL (ref 32.0–36.0)
MCV: 85 fL (ref 80.0–100.0)
MONO ABS: 0.5 10*3/uL (ref 0.2–0.9)
MONOS PCT: 7 %
NEUTROS ABS: 5 10*3/uL (ref 1.4–6.5)
Neutrophils Relative %: 67 %
PLATELETS: 81 10*3/uL — AB (ref 150–440)
RBC: 5.08 MIL/uL (ref 3.80–5.20)
RDW: 15.4 % — AB (ref 11.5–14.5)
WBC: 7.4 10*3/uL (ref 3.6–11.0)

## 2015-10-11 LAB — SAMPLE TO BLOOD BANK

## 2015-10-12 ENCOUNTER — Ambulatory Visit: Payer: Commercial Managed Care - HMO | Admitting: Family Medicine

## 2015-10-17 NOTE — Progress Notes (Signed)
[place]  Telephone:(336) [phone] Fax:(336) [phone]  ID: Erica Bartlett OB: 1955-10-21  MR#: [id number]  SMO#:[id number]  Patient Care Team: Arlis Porta., MD as PCP - General (Family Medicine)  CHIEF COMPLAINT:  Chief Complaint  Patient presents with  . ITP    INTERVAL HISTORY: Patient returns to clinic today for repeat laboratory work and further evaluation. She currently feels well and is asymptomatic. She is tolerating 10 mg prednisone without significant side effects. She any further increased bleeding or bruising. She denies any recent fevers or illnesses. She has no neurologic complaints. She denies any chest pain or shortness of breath. She has no nausea, vomiting, constipation, or diarrhea. She has no urinary complaints. Patient offers no further specific complaints today.  REVIEW OF SYSTEMS:   Review of Systems  Constitutional: Negative for fever, weight loss and malaise/fatigue.  Respiratory: Negative for shortness of breath.   Cardiovascular: Negative.  Negative for chest pain.  Gastrointestinal: Negative.  Negative for blood in stool and melena.  Genitourinary: Negative.  Negative for hematuria.  Neurological: Negative for weakness.  Endo/Heme/Allergies: Does not bruise/bleed easily.  Psychiatric/Behavioral: The patient is nervous/anxious.     As per HPI. Otherwise, a complete review of systems is negatve.  PAST MEDICAL HISTORY: Past Medical History  Diagnosis Date  . Hypertension     PAST SURGICAL HISTORY: Past Surgical History  Procedure Laterality Date  . Cesarean section    . Cyst removed      breast, benign  . Abdominal hysterectomy      FAMILY HISTORY: Reviewed and unchanged. No reported history of malignancy or chronic disease.     ADVANCED DIRECTIVES:    HEALTH MAINTENANCE: Social History  Substance Use Topics  . Smoking status: Current Some Day Smoker  . Smokeless tobacco: Never Used  . Alcohol Use: No      No Known Allergies  Current Outpatient Prescriptions  Medication Sig Dispense Refill  . acetaminophen (TYLENOL) 500 MG tablet Take 500 mg by mouth every 6 (six) hours as needed.    Marland Kitchen amLODipine (NORVASC) 10 MG tablet Take 1 tablet (10 mg total) by mouth daily. 30 tablet 6  . clonazePAM (KLONOPIN) 1 MG tablet Take 1-2 mg by mouth 3 (three) times daily as needed. Reported on 10/11/2015    . hydrochlorothiazide (HYDRODIURIL) 25 MG tablet Take 1 tablet (25 mg total) by mouth daily. 30 tablet 12  . predniSONE (DELTASONE) 5 MG tablet Take 1 tablet (5 mg total) by mouth daily with breakfast. 20 tablet 1  . albuterol (PROVENTIL HFA;VENTOLIN HFA) 108 (90 BASE) MCG/ACT inhaler Inhale 2 puffs into the lungs every 6 (six) hours as needed for wheezing or shortness of breath. (Patient not taking: Reported on 10/11/2015) 1 Inhaler 3  . cyclobenzaprine (FLEXERIL) 10 MG tablet Take 1 tablet (10 mg total) by mouth 3 (three) times daily as needed for muscle spasms. (Patient not taking: Reported on 10/11/2015) 30 tablet 1  . pregabalin (LYRICA) 75 MG capsule Take 1 capsule (75 mg total) by mouth 3 (three) times daily. (Patient not taking: Reported on 10/11/2015) 90 capsule 5   No current facility-administered medications for this visit.    OBJECTIVE: Filed Vitals:   10/11/15 1100  BP: 117/82  Pulse: 76  Temp: 96 F (35.6 C)  Resp: 18     Body mass index is 32.77 kg/(m^2).    ECOG FS:0 - Asymptomatic  General: Well-developed, well-nourished, no acute distress. Eyes: Pink conjunctiva, anicteric sclera. Lungs: Clear  to auscultation bilaterally. Heart: Regular rate and rhythm. No rubs, murmurs, or gallops. Abdomen: Soft, nontender, nondistended. No organomegaly noted, normoactive bowel sounds. Musculoskeletal: No edema, cyanosis, or clubbing. Neuro: Alert, answering all questions appropriately. Cranial nerves grossly intact. Skin: No rashes or petechiae noted. Psych: Normal affect.   LAB  RESULTS:  Lab Results  Component Value Date   NA 140 08/04/2015   K 3.5 08/04/2015   CL 95* 08/04/2015   CO2 21 08/04/2015   GLUCOSE 91 08/04/2015   BUN 17 08/04/2015   CREATININE 0.98 08/04/2015   CALCIUM 9.3 08/04/2015   PROT 7.7 07/27/2015   ALBUMIN 3.9 07/27/2015   AST 36 07/27/2015   ALT 34 07/27/2015   ALKPHOS 81 07/27/2015   BILITOT 0.3 07/27/2015   GFRNONAA 64 08/04/2015   GFRAA 74 08/04/2015    Lab Results  Component Value Date   WBC 7.4 10/11/2015   NEUTROABS 5.0 10/11/2015   HGB 14.8 10/11/2015   HCT 43.2 10/11/2015   MCV 85.0 10/11/2015   PLT 81* 10/11/2015     STUDIES: No results found.  ASSESSMENT: Bone marrow biopsy proven ITP.  PLAN:    1. ITP: Bone marrow biopsy performed on July 28, 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 Patient's platelet count is mildly improved at 81. Continue to monitor closely. Patient has been instructed to decrease prednisone to 5 mg daily and return to clinic in 1 week for repeat laboratory work and then in 2 weeks for laboratory work and further evaluation. Patient received cycle 4 of 4 of weekly Rituxan on Aug 30, 2015.  If her platelet count continues to drop, she may benefit from Nplate.  Patient expressed understanding and was in agreement with this plan. She also understands that She can call clinic at any time with any questions, concerns, or complaints.   Lloyd Huger, MD   10/17/2015 11:19 PM

## 2015-10-18 ENCOUNTER — Inpatient Hospital Stay: Payer: Commercial Managed Care - HMO

## 2015-10-18 DIAGNOSIS — I10 Essential (primary) hypertension: Secondary | ICD-10-CM | POA: Diagnosis not present

## 2015-10-18 DIAGNOSIS — F17210 Nicotine dependence, cigarettes, uncomplicated: Secondary | ICD-10-CM | POA: Diagnosis not present

## 2015-10-18 DIAGNOSIS — D693 Immune thrombocytopenic purpura: Secondary | ICD-10-CM

## 2015-10-18 DIAGNOSIS — Z79899 Other long term (current) drug therapy: Secondary | ICD-10-CM | POA: Diagnosis not present

## 2015-10-18 LAB — CBC WITH DIFFERENTIAL/PLATELET
BASOS PCT: 1 %
Basophils Absolute: 0 10*3/uL (ref 0–0.1)
EOS ABS: 0.1 10*3/uL (ref 0–0.7)
Eosinophils Relative: 2 %
HEMATOCRIT: 41.9 % (ref 35.0–47.0)
HEMOGLOBIN: 14.6 g/dL (ref 12.0–16.0)
LYMPHS ABS: 1.4 10*3/uL (ref 1.0–3.6)
Lymphocytes Relative: 27 %
MCH: 29.5 pg (ref 26.0–34.0)
MCHC: 34.8 g/dL (ref 32.0–36.0)
MCV: 84.7 fL (ref 80.0–100.0)
Monocytes Absolute: 0.6 10*3/uL (ref 0.2–0.9)
Monocytes Relative: 11 %
NEUTROS ABS: 3 10*3/uL (ref 1.4–6.5)
NEUTROS PCT: 59 %
Platelets: 82 10*3/uL — ABNORMAL LOW (ref 150–440)
RBC: 4.95 MIL/uL (ref 3.80–5.20)
RDW: 14.9 % — ABNORMAL HIGH (ref 11.5–14.5)
WBC: 5.1 10*3/uL (ref 3.6–11.0)

## 2015-10-20 ENCOUNTER — Telehealth: Payer: Self-pay | Admitting: Family Medicine

## 2015-10-20 NOTE — Telephone Encounter (Signed)
Updated referral entered: Approval [id number] Valid 10/25/2015--04/22/2016. #6 visits.

## 2015-10-20 NOTE — Telephone Encounter (Signed)
Dr. Maretta Bees  NPI : [id number]   [place]-  July 3rd  @ 10:00   DX-  D69.3    [facility] Call Back # [phone]

## 2015-10-22 ENCOUNTER — Encounter: Payer: Self-pay | Admitting: Emergency Medicine

## 2015-10-22 ENCOUNTER — Emergency Department
Admission: EM | Admit: 2015-10-22 | Discharge: 2015-10-22 | Disposition: A | Discharge status: Home / Self Care | Payer: Commercial Managed Care - HMO | Attending: Emergency Medicine | Admitting: Emergency Medicine

## 2015-10-22 ENCOUNTER — Emergency Department: Payer: Commercial Managed Care - HMO

## 2015-10-22 DIAGNOSIS — M25511 Pain in right shoulder: Secondary | ICD-10-CM | POA: Diagnosis not present

## 2015-10-22 DIAGNOSIS — Z79899 Other long term (current) drug therapy: Secondary | ICD-10-CM | POA: Insufficient documentation

## 2015-10-22 DIAGNOSIS — F17200 Nicotine dependence, unspecified, uncomplicated: Secondary | ICD-10-CM | POA: Diagnosis not present

## 2015-10-22 DIAGNOSIS — I10 Essential (primary) hypertension: Secondary | ICD-10-CM | POA: Diagnosis not present

## 2015-10-22 MED ORDER — HYDROCODONE-ACETAMINOPHEN 5-325 MG PO TABS: 1.0000 | ORAL_TABLET | ORAL | Status: DC | PRN

## 2015-10-22 NOTE — ED Provider Notes (Signed)
[facility] Emergency Department Provider Note   ____________________________________________  Time seen: Approximately 11:34 AM  I have reviewed the triage vital signs and the nursing notes.   HISTORY  Chief Complaint Shoulder Pain   HPI Erica Bartlett is a 60 y.o. female is here with complaint of right shoulder pain that has been going on for approximately one week. Patient states that there is been no recent injury. Patient currently is taking prednisone and has tapered down to 5 mg as she is seeing someone in the [place]. Patient states that she tapered down she had increased pain in her right shoulder and now decreased range of motion. She states she had similar episode 2 years ago. She denies any paresthesias or difficulty with grip.   Past Medical History  Diagnosis Date  . Hypertension     Patient Active Problem List   Diagnosis Date Noted  . Hypokalemia 08/03/2015  . ITP (idiopathic thrombocytopenic purpura) 08/02/2015  . Thrombocytopenia ([place]) 07/27/2015  . Bronchitis 07/26/2015  . Bruising, spontaneous 07/26/2015  . Chronic leg pain 06/28/2015  . Plantar warts 06/28/2015  . Fibromyalgia 05/19/2015  . Allergic rhinitis 02/18/2015  . Smoker 02/18/2015  . Other spondylosis with radiculopathy, cervical region 10/28/2014  . Other shoulder lesions, right shoulder 10/28/2014  . Arm pain, chronic 10/14/2014  . Hypertension 10/14/2014    Past Surgical History  Procedure Laterality Date  . Cesarean section    . Cyst removed      breast, benign  . Abdominal hysterectomy      Current Outpatient Rx  Name  Route  Sig  Dispense  Refill  . acetaminophen (TYLENOL) 500 MG tablet   Oral   Take 500 mg by mouth every 6 (six) hours as needed.         Marland Kitchen amLODipine (NORVASC) 10 MG tablet   Oral   Take 1 tablet (10 mg total) by mouth daily.   30 tablet   6   . clonazePAM (KLONOPIN) 1 MG tablet   Oral   Take 1-2 mg by mouth 3  (three) times daily as needed. Reported on 10/11/2015         . hydrochlorothiazide (HYDRODIURIL) 25 MG tablet   Oral   Take 1 tablet (25 mg total) by mouth daily.   30 tablet   12   . HYDROcodone-acetaminophen (NORCO/VICODIN) 5-325 MG tablet   Oral   Take 1 tablet by mouth every 4 (four) hours as needed for moderate pain.   20 tablet   0   . predniSONE (DELTASONE) 5 MG tablet   Oral   Take 1 tablet (5 mg total) by mouth daily with breakfast.   20 tablet   1   . pregabalin (LYRICA) 75 MG capsule   Oral   Take 1 capsule (75 mg total) by mouth 3 (three) times daily. Patient not taking: Reported on 10/11/2015   90 capsule   5     Allergies Review of patient's allergies indicates no known allergies.  No family history on file.  Social History Social History  Substance Use Topics  . Smoking status: Current Some Day Smoker  . Smokeless tobacco: Never Used  . Alcohol Use: No    Review of Systems Constitutional: No fever/chills Cardiovascular: Denies chest pain. Respiratory: Denies shortness of breath. Gastrointestinal:  No nausea, no vomiting.   Musculoskeletal: Negative for back pain.Positive right shoulder pain. Skin: Negative for rash. Neurological: Negative for headaches, focal weakness or numbness.  10-point ROS otherwise negative.  ____________________________________________   PHYSICAL EXAM:  VITAL SIGNS: ED Triage Vitals  Enc Vitals Group     BP 10/22/15 1129 142/85 mmHg     Pulse Rate 10/22/15 1129 71     Resp --      Temp 10/22/15 1129 98.1 F (36.7 C)     Temp Source 10/22/15 1129 Oral     SpO2 10/22/15 1129 99 %     Weight --      Height 10/22/15 1129 5\' 4"  (1.626 m)     Head Cir --      Peak Flow --      Pain Score --      Pain Loc --      Pain Edu? --      Excl. in [place]? --     Constitutional: Alert and oriented. Well appearing and in no acute distress. Eyes: Conjunctivae are normal. PERRL. EOMI. Head: Atraumatic. Nose: No  congestion/rhinnorhea. Neck: No stridor.   Cardiovascular: Normal rate, regular rhythm. Grossly normal heart sounds.  Good peripheral circulation. Respiratory: Normal respiratory effort.  No retractions. Lungs CTAB. Musculoskeletal: Right shoulder there is no gross deformity there is moderate tenderness on palpation of the anterior portion with minimal tenderness on palpation of the deltoid area. There is no appreciated soft tissue edema. Range of motion is without crepitus. Range of motion is restricted especially in abduction. Neurologic:  Normal speech and language. No gross focal neurologic deficits are appreciated. No gait instability. Skin:  Skin is warm, dry and intact. No rash noted. No ecchymosis, abrasions or erythema is noted on the right shoulder. Psychiatric: Mood and affect are normal. Speech and behavior are normal.  ____________________________________________   LABS (all labs ordered are listed, but only abnormal results are displayed)  Labs Reviewed - No data to display  RADIOLOGY  Right shoulder x-ray per radiologist shows no acute abnormalities. I, Johnn Hai, personally viewed and evaluated these images (plain radiographs) as part of my medical decision making, as well as reviewing the written report by the radiologist. ____________________________________________   PROCEDURES  Procedure(s) performed: None  Critical Care performed: No  ____________________________________________   INITIAL IMPRESSION / ASSESSMENT AND PLAN / ED COURSE  Pertinent labs & imaging results that were available during my care of the patient were reviewed by me and considered in my medical decision making (see chart for details).  Patient was given a prescription for Norco as needed for pain. She is also given a sling to wear for no more than 3 days for support. Patient is encouraged to use ice to the area for help with pain control. She is to follow-up with her primary care  doctor or Dr. Roland Rack if any continued problems with her shoulder. ____________________________________________   FINAL CLINICAL IMPRESSION(S) / ED DIAGNOSES  Final diagnoses:  Right anterior shoulder pain      NEW MEDICATIONS STARTED DURING THIS VISIT:  Discharge Medication List as of 10/22/2015  1:01 PM    START taking these medications   Details  HYDROcodone-acetaminophen (NORCO/VICODIN) 5-325 MG tablet Take 1 tablet by mouth every 4 (four) hours as needed for moderate pain., Starting 10/22/2015, Until Discontinued, Print         Note:  This document was prepared using Dragon voice recognition software and may include unintentional dictation errors.    Johnn Hai, PA-C 10/22/15 [place], MD 10/22/15 [phone]

## 2015-10-22 NOTE — Discharge Instructions (Signed)
Follow-up with your primary care doctor if any continued problems with your shoulder. Use sling for no more than 3 days to prevent a frozen shoulder. Norco as needed for severe pain. You may also use ice to her shoulder to decrease pain.

## 2015-10-22 NOTE — ED Notes (Signed)
Reports right shoulder pain for several days, hurts to lift it.  Denies known injury

## 2015-10-25 ENCOUNTER — Inpatient Hospital Stay: Payer: Commercial Managed Care - HMO | Attending: Oncology

## 2015-10-25 ENCOUNTER — Inpatient Hospital Stay (HOSPITAL_BASED_OUTPATIENT_CLINIC_OR_DEPARTMENT_OTHER): Payer: Commercial Managed Care - HMO | Admitting: Oncology

## 2015-10-25 VITALS — BP 128/85 | HR 60 | Temp 97.9°F | Resp 20 | Wt 189.8 lb

## 2015-10-25 DIAGNOSIS — M25511 Pain in right shoulder: Secondary | ICD-10-CM | POA: Diagnosis not present

## 2015-10-25 DIAGNOSIS — Z79899 Other long term (current) drug therapy: Secondary | ICD-10-CM

## 2015-10-25 DIAGNOSIS — D693 Immune thrombocytopenic purpura: Secondary | ICD-10-CM | POA: Diagnosis not present

## 2015-10-25 DIAGNOSIS — I10 Essential (primary) hypertension: Secondary | ICD-10-CM | POA: Diagnosis not present

## 2015-10-25 DIAGNOSIS — F17210 Nicotine dependence, cigarettes, uncomplicated: Secondary | ICD-10-CM

## 2015-10-25 DIAGNOSIS — D696 Thrombocytopenia, unspecified: Secondary | ICD-10-CM

## 2015-10-25 LAB — CBC WITH DIFFERENTIAL/PLATELET
BASOS PCT: 1 %
Basophils Absolute: 0.1 10*3/uL (ref 0–0.1)
EOS ABS: 0.2 10*3/uL (ref 0–0.7)
EOS PCT: 3 %
HCT: 40.7 % (ref 35.0–47.0)
Hemoglobin: 14 g/dL (ref 12.0–16.0)
LYMPHS ABS: 1.9 10*3/uL (ref 1.0–3.6)
Lymphocytes Relative: 34 %
MCH: 29.3 pg (ref 26.0–34.0)
MCHC: 34.5 g/dL (ref 32.0–36.0)
MCV: 84.8 fL (ref 80.0–100.0)
MONO ABS: 0.5 10*3/uL (ref 0.2–0.9)
MONOS PCT: 9 %
Neutro Abs: 3.1 10*3/uL (ref 1.4–6.5)
Neutrophils Relative %: 53 %
Platelets: 127 10*3/uL — ABNORMAL LOW (ref 150–440)
RBC: 4.8 MIL/uL (ref 3.80–5.20)
RDW: 15.3 % — AB (ref 11.5–14.5)
WBC: 5.8 10*3/uL (ref 3.6–11.0)

## 2015-10-25 NOTE — Progress Notes (Signed)
[place]  [phone] [phone]  ID: Erica Bartlett OB: June 12, 1955  MR#: [id number]  TJQ#:[id number]  Patient Care Team: Arlis Porta., MD as PCP - General (Family Medicine) Dereck Leep, MD (Orthopedic Surgery)  CHIEF COMPLAINT:  No chief complaint on file.   INTERVAL HISTORY: Patient returns to clinic today for repeat laboratory work and further evaluation. She was recently evaluated in the ER with significant, 10/10, right shoulder pain. X-rays were unrevealing. She otherwise feels well and is asymptomatic. She any further increased bleeding or bruising. She denies any recent fevers or illnesses. She has no neurologic complaints. She denies any chest pain or shortness of breath. She has no nausea, vomiting, constipation, or diarrhea. She has no urinary complaints. Patient offers no further specific complaints today.  REVIEW OF SYSTEMS:   Review of Systems  Constitutional: Negative for fever, weight loss and malaise/fatigue.  Respiratory: Negative for shortness of breath.   Cardiovascular: Negative.  Negative for chest pain.  Gastrointestinal: Negative.  Negative for blood in stool and melena.  Genitourinary: Negative.  Negative for hematuria.  Musculoskeletal: Positive for joint pain.  Neurological: Negative for weakness.  Endo/Heme/Allergies: Does not bruise/bleed easily.  Psychiatric/Behavioral: The patient is nervous/anxious.     As per HPI. Otherwise, a complete review of systems is negatve.  PAST MEDICAL HISTORY: Past Medical History  Diagnosis Date  . Hypertension     PAST SURGICAL HISTORY: Past Surgical History  Procedure Laterality Date  . Cesarean section    . Cyst removed      breast, benign  . Abdominal hysterectomy      FAMILY HISTORY: Reviewed and unchanged. No reported history of malignancy or chronic disease.     ADVANCED DIRECTIVES:    HEALTH MAINTENANCE: Social History  Substance Use  Topics  . Smoking status: Current Some Day Smoker  . Smokeless tobacco: Never Used  . Alcohol Use: No    No Known Allergies  Current Outpatient Prescriptions  Medication Sig Dispense Refill  . acetaminophen (TYLENOL) 500 MG tablet Take 500 mg by mouth every 6 (six) hours as needed.    Marland Kitchen amLODipine (NORVASC) 10 MG tablet Take 1 tablet (10 mg total) by mouth daily. 30 tablet 6  . clonazePAM (KLONOPIN) 1 MG tablet Take 1-2 mg by mouth 3 (three) times daily as needed. Reported on 10/11/2015    . hydrochlorothiazide (HYDRODIURIL) 25 MG tablet Take 1 tablet (25 mg total) by mouth daily. 30 tablet 12  . HYDROcodone-acetaminophen (NORCO/VICODIN) 5-325 MG tablet Take 1 tablet by mouth every 4 (four) hours as needed for moderate pain. (Patient taking differently: Take 0.5 tablets by mouth every 4 (four) hours as needed for moderate pain. ) 20 tablet 0  . predniSONE (DELTASONE) 5 MG tablet Take 1 tablet (5 mg total) by mouth daily with breakfast. 20 tablet 1  . pregabalin (LYRICA) 75 MG capsule Take 1 capsule (75 mg total) by mouth 3 (three) times daily. 90 capsule 5  . [DISCONTINUED] albuterol (PROVENTIL HFA;VENTOLIN HFA) 108 (90 BASE) MCG/ACT inhaler Inhale 2 puffs into the lungs every 6 (six) hours as needed for wheezing or shortness of breath. (Patient not taking: Reported on 10/11/2015) 1 Inhaler 3   No current facility-administered medications for this visit.    OBJECTIVE: Filed Vitals:   10/25/15 1053  BP: 128/85  Pulse: 60  Temp: 97.9 F (36.6 C)  Resp: 20     Body mass index is 32.57 kg/(m^2).    ECOG FS:0 -  Asymptomatic  General: Well-developed, well-nourished, no acute distress. Eyes: Pink conjunctiva, anicteric sclera. Lungs: Clear to auscultation bilaterally. Heart: Regular rate and rhythm. No rubs, murmurs, or gallops. Abdomen: Soft, nontender, nondistended. No organomegaly noted, normoactive bowel sounds. Musculoskeletal: No edema, cyanosis, or clubbing. Neuro: Alert,  answering all questions appropriately. Cranial nerves grossly intact. Skin: No rashes or petechiae noted. Psych: Normal affect.   LAB RESULTS:  Lab Results  Component Value Date   NA 140 08/04/2015   K 3.5 08/04/2015   CL 95* 08/04/2015   CO2 21 08/04/2015   GLUCOSE 91 08/04/2015   BUN 17 08/04/2015   CREATININE 0.98 08/04/2015   CALCIUM 9.3 08/04/2015   PROT 7.7 07/27/2015   ALBUMIN 3.9 07/27/2015   AST 36 07/27/2015   ALT 34 07/27/2015   ALKPHOS 81 07/27/2015   BILITOT 0.3 07/27/2015   GFRNONAA 64 08/04/2015   GFRAA 74 08/04/2015    Lab Results  Component Value Date   WBC 5.8 10/25/2015   NEUTROABS 3.1 10/25/2015   HGB 14.0 10/25/2015   HCT 40.7 10/25/2015   MCV 84.8 10/25/2015   PLT 127* 10/25/2015     STUDIES: Dg Shoulder Right  10/22/2015  CLINICAL DATA:  RIGHT shoulder pain for 1 week, pain increased for 3 days, increased pain when lifting arm, pain at lateral and pop portions of shoulder, no known injury EXAM: RIGHT SHOULDER - 2+ VIEW COMPARISON:  09/18/2014 FINDINGS: Osseous demineralization. AC joint alignment normal. No acute fracture, dislocation, or bone destruction. Visualized RIGHT ribs intact. IMPRESSION: No acute abnormalities. Electronically Signed   By: Lavonia Dana M.D.   On: 10/22/2015 12:45    ASSESSMENT: Bone marrow biopsy proven ITP.  PLAN:    1. ITP: Bone marrow biopsy performed on July 28, 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 Patient's platelet count Continues to trend up and is now 127. Continue to monitor closely. Patient has been instructed to discontinue prednisone. Return to clinic in 2 weeks for repeat laboratory work and then in 4 weeks for laboratory work and further evaluation. Patient received cycle 4 of 4 of weekly Rituxan on Aug 30, 2015.  If her platelet count declines now that she is off prednisone, she may benefit from Nplate. 2. Shoulder pain: Unclear etiology. Patient's symptoms may have been  masked by her prednisone taper. Will get an MRI of her right shoulder and refer to orthopedics for further evaluation.  Patient expressed understanding and was in agreement with this plan. She also understands that She can call clinic at any time with any questions, concerns, or complaints.   Lloyd Huger, MD   10/25/2015 11:12 AM

## 2015-10-28 ENCOUNTER — Encounter: Payer: Self-pay | Admitting: Family Medicine

## 2015-10-28 ENCOUNTER — Ambulatory Visit (INDEPENDENT_AMBULATORY_CARE_PROVIDER_SITE_OTHER): Payer: Commercial Managed Care - HMO | Admitting: Family Medicine

## 2015-10-28 VITALS — BP 123/78 | HR 68 | Temp 97.9°F | Resp 16 | Ht 63.5 in | Wt 188.0 lb

## 2015-10-28 DIAGNOSIS — D693 Immune thrombocytopenic purpura: Secondary | ICD-10-CM | POA: Diagnosis not present

## 2015-10-28 DIAGNOSIS — M25511 Pain in right shoulder: Secondary | ICD-10-CM

## 2015-10-28 MED ORDER — METHYLPREDNISOLONE 4 MG PO TBPK: ORAL_TABLET | ORAL | Status: DC

## 2015-10-28 MED ORDER — NAPROXEN 500 MG PO TABS: 500.0000 mg | ORAL_TABLET | Freq: Two times a day (BID) | ORAL | Status: DC

## 2015-10-28 NOTE — Progress Notes (Signed)
Subjective:    Patient ID: CORTLAND DENBO, female    DOB: 1955/06/13, 60 y.o.   MRN: [id number]  HPI: Azirah Perkowski Veitch is a 60 y.o. female presenting on 10/28/2015 for er follow up   HPI  Pt presents for R shoulder pain. This is a chronic issue that has recently flared. She was recently tapered off prednisone for her ITP and the arm pain began after stopping prednisone. Seen in ER on 6/30- they deferred treatment due to her IT. She saw Dr. Grayland Ormond on Monday who placed a referral to ortho and set up an MRI of shoulder. Pain is 10/10. She is having weakness on the R side and some numbness in the fingers. She saw Dr. Roland Rack last year and was given a steroid injection to the arm. She has yet to set up appt with ortho at this time.   Past Medical History  Diagnosis Date  . Hypertension     Current Outpatient Prescriptions on File Prior to Visit  Medication Sig  . acetaminophen (TYLENOL) 500 MG tablet Take 500 mg by mouth every 6 (six) hours as needed.  Marland Kitchen amLODipine (NORVASC) 10 MG tablet Take 1 tablet (10 mg total) by mouth daily.  . clonazePAM (KLONOPIN) 1 MG tablet Take 1-2 mg by mouth 3 (three) times daily as needed. Reported on 10/11/2015  . hydrochlorothiazide (HYDRODIURIL) 25 MG tablet Take 1 tablet (25 mg total) by mouth daily.  Marland Kitchen HYDROcodone-acetaminophen (NORCO/VICODIN) 5-325 MG tablet Take 1 tablet by mouth every 4 (four) hours as needed for moderate pain. (Patient taking differently: Take 0.5 tablets by mouth every 4 (four) hours as needed for moderate pain. )  . pregabalin (LYRICA) 75 MG capsule Take 1 capsule (75 mg total) by mouth 3 (three) times daily.  . [DISCONTINUED] albuterol (PROVENTIL HFA;VENTOLIN HFA) 108 (90 BASE) MCG/ACT inhaler Inhale 2 puffs into the lungs every 6 (six) hours as needed for wheezing or shortness of breath. (Patient not taking: Reported on 10/11/2015)   No current facility-administered medications on file prior to visit.    Review of Systems    Constitutional: Negative for fever and chills.  HENT: Negative.   Respiratory: Negative for cough, chest tightness and wheezing.   Cardiovascular: Negative for chest pain and leg swelling.  Gastrointestinal: Negative for nausea, vomiting, abdominal pain, diarrhea and constipation.  Endocrine: Negative.  Negative for cold intolerance, heat intolerance, polydipsia, polyphagia and polyuria.  Genitourinary: Negative for dysuria and difficulty urinating.  Musculoskeletal: Positive for arthralgias.  Neurological: Negative for dizziness, light-headedness and numbness.  Psychiatric/Behavioral: Negative.    Per HPI unless specifically indicated above     Objective:    BP 123/78 mmHg  Pulse 68  Temp(Src) 97.9 F (36.6 C) (Oral)  Resp 16  Ht 5' 3.5" (1.613 m)  Wt 188 lb (85.276 kg)  BMI 32.78 kg/m2  Wt Readings from Last 3 Encounters:  10/28/15 188 lb (85.276 kg)  10/25/15 189 lb 13.1 oz (86.1 kg)  10/11/15 187 lb 15.1 oz (85.25 kg)    Physical Exam  Constitutional: She appears well-developed and well-nourished. No distress.  Cardiovascular: Normal rate and regular rhythm.  Exam reveals no gallop and no friction rub.   No murmur heard. Pulmonary/Chest: Effort normal and breath sounds normal. She has no wheezes. She has no rales.  Musculoskeletal:       Right shoulder: She exhibits decreased range of motion (adduction, abduction, external rotation.), tenderness (AC joint) and decreased strength (4/5). She exhibits no swelling, no effusion,  no crepitus, no deformity and normal pulse.       Left shoulder: Normal.  +painful arc  Skin: Skin is warm and dry. No rash noted. She is not diaphoretic. No erythema.  Psychiatric: She has a normal mood and affect. Her behavior is normal. Judgment and thought content normal.   Results for orders placed or performed in visit on 10/25/15  CBC with Differential  Result Value Ref Range   WBC 5.8 3.6 - 11.0 K/uL   RBC 4.80 3.80 - 5.20 MIL/uL    Hemoglobin 14.0 12.0 - 16.0 g/dL   HCT 40.7 35.0 - 47.0 %   MCV 84.8 80.0 - 100.0 fL   MCH 29.3 26.0 - 34.0 pg   MCHC 34.5 32.0 - 36.0 g/dL   RDW 15.3 (H) 11.5 - 14.5 %   Platelets 127 (L) 150 - 440 K/uL   Neutrophils Relative % 53 %   Neutro Abs 3.1 1.4 - 6.5 K/uL   Lymphocytes Relative 34 %   Lymphs Abs 1.9 1.0 - 3.6 K/uL   Monocytes Relative 9 %   Monocytes Absolute 0.5 0.2 - 0.9 K/uL   Eosinophils Relative 3 %   Eosinophils Absolute 0.2 0 - 0.7 K/uL   Basophils Relative 1 %   Basophils Absolute 0.1 0 - 0.1 K/uL      Assessment & Plan:   Problem List Items Addressed This Visit      Hematopoietic and Hemostatic   ITP (idiopathic thrombocytopenic purpura)    Concerned about medrol dosing and her treatment for ITP. Have consulted Dr. Grayland Ormond in hematology and medrol dose pak is okay by him. Pt will continue to follow with hematology.         Other   Right shoulder pain - Primary    Possible impingement syndrome. Pt to follow with ortho- she can call and schedule with Dr. Roland Rack as she is established patient. We will place [facility] referral after she schedules. Medrol dose pak for pain.       Relevant Medications   naproxen (NAPROSYN) 500 MG tablet      Meds ordered this encounter  Medications  . naproxen (NAPROSYN) 500 MG tablet    Sig: Take 1 tablet (500 mg total) by mouth 2 (two) times daily with a meal.    Dispense:  30 tablet    Refill:  0    Order Specific Question:  Supervising Provider    Answer:  Arlis Porta [phone]  . methylPREDNISolone (MEDROL DOSEPAK) 4 MG TBPK tablet    Sig: Day 1: Two tablets before breakfast, one after lunch, one after dinner, and two at bedtime Day 2: One tablet before breakfast, one after lunch, one after dinner, and two at bedtime Day 3: One tablet before breakfast, one after lunch, one after dinner, and one at bedtime Day 4: One tablet before breakfast, one after lunch, and one at bedtime Day 5: One tablet before breakfast  and one at bedtime Day 6: One tablet before breakfast    Dispense:  21 tablet    Refill:  0    Order Specific Question:  Supervising Provider    Answer:  Arlis Porta [id number]      Follow up plan: Return if symptoms worsen or fail to improve, for please follow with ortho. Marland Kitchen

## 2015-10-28 NOTE — Assessment & Plan Note (Signed)
Possible impingement syndrome. Pt to follow with ortho- she can call and schedule with Dr. Roland Rack as she is established patient. We will place [facility] referral after she schedules. Medrol dose pak for pain.

## 2015-10-28 NOTE — Assessment & Plan Note (Signed)
Concerned about medrol dosing and her treatment for ITP. Have consulted Dr. Grayland Ormond in hematology and medrol dose pak is okay by him. Pt will continue to follow with hematology.

## 2015-10-28 NOTE — Patient Instructions (Signed)
R shoulder pain- We will have you seen by orthopedics. You should be able to make your own appt because you are an established patient. However due to [facility] we need to put a referral in the portal. Please call us with date, time, and the name of the provider you are seeing.    Poggi, Smith Mince, MD  [place]  [facility] [place], [place] [address]  Phone: [phone]  Fax: [phone]  I will contact Dr. Grayland Ormond to determine if you can take any more steroids to help with your arm.

## 2015-11-03 ENCOUNTER — Inpatient Hospital Stay: Payer: Commercial Managed Care - HMO

## 2015-11-03 DIAGNOSIS — D696 Thrombocytopenia, unspecified: Secondary | ICD-10-CM

## 2015-11-03 DIAGNOSIS — M25511 Pain in right shoulder: Secondary | ICD-10-CM | POA: Diagnosis not present

## 2015-11-03 DIAGNOSIS — I10 Essential (primary) hypertension: Secondary | ICD-10-CM | POA: Diagnosis not present

## 2015-11-03 DIAGNOSIS — D693 Immune thrombocytopenic purpura: Secondary | ICD-10-CM | POA: Diagnosis not present

## 2015-11-03 DIAGNOSIS — Z79899 Other long term (current) drug therapy: Secondary | ICD-10-CM | POA: Diagnosis not present

## 2015-11-03 DIAGNOSIS — F17210 Nicotine dependence, cigarettes, uncomplicated: Secondary | ICD-10-CM | POA: Diagnosis not present

## 2015-11-03 LAB — CBC WITH DIFFERENTIAL/PLATELET
BASOS ABS: 0.1 10*3/uL (ref 0–0.1)
Basophils Relative: 1 %
EOS ABS: 0.1 10*3/uL (ref 0–0.7)
EOS PCT: 1 %
HCT: 42.5 % (ref 35.0–47.0)
HEMOGLOBIN: 14.5 g/dL (ref 12.0–16.0)
LYMPHS ABS: 2.5 10*3/uL (ref 1.0–3.6)
LYMPHS PCT: 23 %
MCH: 28.9 pg (ref 26.0–34.0)
MCHC: 34.2 g/dL (ref 32.0–36.0)
MCV: 84.5 fL (ref 80.0–100.0)
MONOS PCT: 5 %
Monocytes Absolute: 0.6 10*3/uL (ref 0.2–0.9)
NEUTROS ABS: 7.8 10*3/uL — AB (ref 1.4–6.5)
NEUTROS PCT: 70 %
PLATELETS: 108 10*3/uL — AB (ref 150–440)
RBC: 5.03 MIL/uL (ref 3.80–5.20)
RDW: 14.9 % — ABNORMAL HIGH (ref 11.5–14.5)
WBC: 11.2 10*3/uL — AB (ref 3.6–11.0)

## 2015-11-08 ENCOUNTER — Other Ambulatory Visit: Payer: Commercial Managed Care - HMO

## 2015-11-08 ENCOUNTER — Inpatient Hospital Stay: Payer: Commercial Managed Care - HMO

## 2015-11-08 DIAGNOSIS — D693 Immune thrombocytopenic purpura: Secondary | ICD-10-CM | POA: Diagnosis not present

## 2015-11-08 DIAGNOSIS — M25511 Pain in right shoulder: Secondary | ICD-10-CM | POA: Diagnosis not present

## 2015-11-08 DIAGNOSIS — F17210 Nicotine dependence, cigarettes, uncomplicated: Secondary | ICD-10-CM | POA: Diagnosis not present

## 2015-11-08 DIAGNOSIS — Z79899 Other long term (current) drug therapy: Secondary | ICD-10-CM | POA: Diagnosis not present

## 2015-11-08 DIAGNOSIS — I10 Essential (primary) hypertension: Secondary | ICD-10-CM | POA: Diagnosis not present

## 2015-11-08 DIAGNOSIS — D696 Thrombocytopenia, unspecified: Secondary | ICD-10-CM

## 2015-11-08 LAB — CBC WITH DIFFERENTIAL/PLATELET
Basophils Absolute: 0.1 K/uL (ref 0–0.1)
Basophils Relative: 1 %
Eosinophils Absolute: 0.2 K/uL (ref 0–0.7)
Eosinophils Relative: 2 %
HCT: 40.7 % (ref 35.0–47.0)
Hemoglobin: 14.2 g/dL (ref 12.0–16.0)
Lymphocytes Relative: 23 %
Lymphs Abs: 1.9 K/uL (ref 1.0–3.6)
MCH: 29.5 pg (ref 26.0–34.0)
MCHC: 34.8 g/dL (ref 32.0–36.0)
MCV: 84.7 fL (ref 80.0–100.0)
Monocytes Absolute: 0.4 K/uL (ref 0.2–0.9)
Monocytes Relative: 5 %
Neutro Abs: 5.7 K/uL (ref 1.4–6.5)
Neutrophils Relative %: 69 %
Platelets: 105 K/uL — ABNORMAL LOW (ref 150–440)
RBC: 4.81 MIL/uL (ref 3.80–5.20)
RDW: 14.8 % — ABNORMAL HIGH (ref 11.5–14.5)
WBC: 8.3 K/uL (ref 3.6–11.0)

## 2015-11-10 ENCOUNTER — Telehealth: Payer: Self-pay | Admitting: Family Medicine

## 2015-11-10 ENCOUNTER — Ambulatory Visit
Admission: RE | Admit: 2015-11-10 | Discharge: 2015-11-10 | Disposition: A | Discharge status: Home / Self Care | Payer: Commercial Managed Care - HMO | Source: Ambulatory Visit | Attending: Oncology | Admitting: Oncology

## 2015-11-10 DIAGNOSIS — M25511 Pain in right shoulder: Secondary | ICD-10-CM

## 2015-11-10 NOTE — Telephone Encounter (Signed)
Erica Bartlett [id number] Valid 11/15/15 thru 05/13/16

## 2015-11-10 NOTE — Telephone Encounter (Signed)
Dr. Roland Rack office  Called  needing a  referral   [phone]  [facility]    Appt:  11/15/15  @ 3:00   Dr. Roland Rack  Npi :  [id number]  DX:   Right  Shoulder   ???

## 2015-11-16 ENCOUNTER — Ambulatory Visit: Payer: Commercial Managed Care - HMO | Admitting: Podiatry

## 2015-11-17 ENCOUNTER — Other Ambulatory Visit: Payer: Self-pay | Admitting: Family Medicine

## 2015-11-17 DIAGNOSIS — I10 Essential (primary) hypertension: Secondary | ICD-10-CM

## 2015-11-18 ENCOUNTER — Ambulatory Visit (INDEPENDENT_AMBULATORY_CARE_PROVIDER_SITE_OTHER): Payer: Commercial Managed Care - HMO | Admitting: Podiatry

## 2015-11-18 ENCOUNTER — Encounter: Payer: Self-pay | Admitting: Podiatry

## 2015-11-18 DIAGNOSIS — M79676 Pain in unspecified toe(s): Secondary | ICD-10-CM | POA: Diagnosis not present

## 2015-11-18 DIAGNOSIS — B351 Tinea unguium: Secondary | ICD-10-CM | POA: Diagnosis not present

## 2015-11-18 DIAGNOSIS — L84 Corns and callosities: Secondary | ICD-10-CM

## 2015-11-18 MED ORDER — CLOTRIMAZOLE-BETAMETHASONE 1-0.05 % EX CREA: 1.0000 "application " | TOPICAL_CREAM | Freq: Two times a day (BID) | CUTANEOUS | 2 refills | Status: DC

## 2015-11-18 NOTE — Progress Notes (Signed)
Subjective: 60 y.o. returns the office today for painful, elongated, thickened toenails which they cannot trim themself. Denies any redness or drainage around the nails. Also has multiple painful calluses to both feet. Denies any acute changes since last appointment and no new complaints today. Denies any systemic complaints such as fevers, chills, nausea, vomiting.   Objective: AAO 3, NAD DP/PT pulses palpable, CRT less than 3 seconds There multiple hyperkeratotic lesions bilaterally. There present left foot sub hallux and 5th toe and on the right submetatarsal 2, plantar hallux, 4th interspace and 4th digit and right plantar medial arch. Upon debridement there is no underlying ulceration, drainage or other signs of infection. The nails are hypertrophic, dystrophic, brittle, discolored, elongated 10. No swelling redness or drainage. There is tenderness to nails 1-5 bilaterally. No open lesions or pre-ulcerative lesions are identified. No other areas of tenderness bilateral lower extremities. No overlying edema, erythema, increased warmth. No pain with calf compression, swelling, warmth, erythema.  Assessment: Patient presents with symptomatic onychomycosis; multiple hyperkertoic lesions   Plan: -Treatment options including alternatives, risks, complications were discussed -Nails sharply debrided 10 without complication/bleeding. -Hypkerkeratotic lesions debrided without complications or bleeding. -Discussed daily foot inspection. If there are any changes, to call the office immediately.  -Follow-up in 3 months or sooner if any problems are to arise. In the meantime, encouraged to call the office with any questions, concerns, changes symptoms.  Celesta Gentile, DPM

## 2015-11-19 DIAGNOSIS — M7551 Bursitis of right shoulder: Secondary | ICD-10-CM | POA: Insufficient documentation

## 2015-11-19 DIAGNOSIS — M7581 Other shoulder lesions, right shoulder: Secondary | ICD-10-CM | POA: Diagnosis not present

## 2015-11-19 DIAGNOSIS — M4722 Other spondylosis with radiculopathy, cervical region: Secondary | ICD-10-CM | POA: Diagnosis not present

## 2015-11-21 NOTE — Progress Notes (Signed)
[facility]  [phone] [phone]  ID: Lucero Auzenne Sanker OB: January 16, 1956  MR#: [id number]  YWV#:[id number]  Patient Care Team: Arlis Porta., MD as PCP - General (Family Medicine) Dereck Leep, MD (Orthopedic Surgery)  CHIEF COMPLAINT:  Chief Complaint  Patient presents with  . Follow-up    ITP    INTERVAL HISTORY: Patient returns to clinic today for repeat laboratory work and further evaluation. She continues to have significant right shoulder pain, but otherwise feels well. She any further increased bleeding or bruising. She denies any recent fevers or illnesses. She has no neurologic complaints. She denies any chest pain or shortness of breath. She has no nausea, vomiting, constipation, or diarrhea. She has no urinary complaints. Patient offers no further specific complaints today.  REVIEW OF SYSTEMS:   Review of Systems  Constitutional: Negative for fever, malaise/fatigue and weight loss.  Respiratory: Negative for shortness of breath.   Cardiovascular: Negative.  Negative for chest pain.  Gastrointestinal: Negative.  Negative for blood in stool and melena.  Genitourinary: Negative.  Negative for hematuria.  Musculoskeletal: Positive for joint pain.  Neurological: Negative for weakness.  Endo/Heme/Allergies: Does not bruise/bleed easily.  Psychiatric/Behavioral: The patient is nervous/anxious.     As per HPI. Otherwise, a complete review of systems is negatve.  PAST MEDICAL HISTORY: Past Medical History:  Diagnosis Date  . Hypertension     PAST SURGICAL HISTORY: Past Surgical History:  Procedure Laterality Date  . ABDOMINAL HYSTERECTOMY    . CESAREAN SECTION    . cyst removed     breast, benign    FAMILY HISTORY: Reviewed and unchanged. No reported history of malignancy or chronic disease.     ADVANCED DIRECTIVES:    HEALTH MAINTENANCE: Social History  Substance Use Topics  . Smoking status: Current Some  Day Smoker  . Smokeless tobacco: Never Used  . Alcohol use No    No Known Allergies  Current Outpatient Prescriptions  Medication Sig Dispense Refill  . acetaminophen (TYLENOL) 500 MG tablet Take 500 mg by mouth every 6 (six) hours as needed.    Marland Kitchen amLODipine (NORVASC) 10 MG tablet TAKE 1 TABLET (10 MG TOTAL) BY MOUTH DAILY. 30 tablet 12  . clonazePAM (KLONOPIN) 1 MG tablet Take 1-2 mg by mouth 3 (three) times daily as needed. Reported on 10/11/2015    . clotrimazole-betamethasone (LOTRISONE) cream Apply 1 application topically 2 (two) times daily. 45 g 2  . hydrochlorothiazide (HYDRODIURIL) 25 MG tablet Take 1 tablet (25 mg total) by mouth daily. 30 tablet 12  . HYDROcodone-acetaminophen (NORCO/VICODIN) 5-325 MG tablet Take 1 tablet by mouth every 4 (four) hours as needed for moderate pain. (Patient taking differently: Take 0.5 tablets by mouth every 4 (four) hours as needed for moderate pain. ) 20 tablet 0  . methylPREDNISolone (MEDROL DOSEPAK) 4 MG TBPK tablet Day 1: Two tablets before breakfast, one after lunch, one after dinner, and two at bedtime Day 2: One tablet before breakfast, one after lunch, one after dinner, and two at bedtime Day 3: One tablet before breakfast, one after lunch, one after dinner, and one at bedtime Day 4: One tablet before breakfast, one after lunch, and one at bedtime Day 5: One tablet before breakfast and one at bedtime Day 6: One tablet before breakfast (Patient not taking: Reported on 11/22/2015) 21 tablet 0  . naproxen (NAPROSYN) 500 MG tablet Take 1 tablet (500 mg total) by mouth 2 (two) times daily with a meal. (Patient  not taking: Reported on 11/22/2015) 30 tablet 0  . pregabalin (LYRICA) 75 MG capsule Take 1 capsule (75 mg total) by mouth 3 (three) times daily. (Patient not taking: Reported on 11/22/2015) 90 capsule 5   No current facility-administered medications for this visit.     OBJECTIVE: Vitals:   11/22/15 1057  BP: (!) 143/85  Pulse: 65    Resp: 18  Temp: (!) 94.7 F (34.8 C)     Body mass index is 32.9 kg/m.    ECOG FS:0 - Asymptomatic  General: Well-developed, well-nourished, no acute distress. Eyes: Pink conjunctiva, anicteric sclera. Lungs: Clear to auscultation bilaterally. Heart: Regular rate and rhythm. No rubs, murmurs, or gallops. Abdomen: Soft, nontender, nondistended. No organomegaly noted, normoactive bowel sounds. Musculoskeletal: No edema, cyanosis, or clubbing. Neuro: Alert, answering all questions appropriately. Cranial nerves grossly intact. Skin: No rashes or petechiae noted. Psych: Normal affect.   LAB RESULTS:  Lab Results  Component Value Date   NA 140 08/04/2015   K 3.5 08/04/2015   CL 95 (L) 08/04/2015   CO2 21 08/04/2015   GLUCOSE 91 08/04/2015   BUN 17 08/04/2015   CREATININE 0.98 08/04/2015   CALCIUM 9.3 08/04/2015   PROT 7.7 07/27/2015   ALBUMIN 3.9 07/27/2015   AST 36 07/27/2015   ALT 34 07/27/2015   ALKPHOS 81 07/27/2015   BILITOT 0.3 07/27/2015   GFRNONAA 64 08/04/2015   GFRAA 74 08/04/2015    Lab Results  Component Value Date   WBC 10.1 11/22/2015   NEUTROABS 7.5 (H) 11/22/2015   HGB 14.5 11/22/2015   HCT 41.5 11/22/2015   MCV 84.5 11/22/2015   PLT 113 (L) 11/22/2015     STUDIES: No results found.  ASSESSMENT: Bone marrow biopsy proven ITP.  PLAN:    1. ITP: Bone marrow biopsy performed on July 28, 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 Patient's platelet count is stable at 113. No intervention is needed. Return to clinic in 4 weeks for repeat laboratory work and then in 8 weeks for laboratory work and further evaluation. Patient received cycle 4 of 4 of weekly Rituxan on Aug 30, 2015.  If her platelet count declines now that she is off prednisone, she may benefit from Nplate.  2. Shoulder pain: Continue treatment and evaluation by orthopedics.   Patient expressed understanding and was in agreement with this plan. She also  understands that She can call clinic at any time with any questions, concerns, or complaints.   Lloyd Huger, MD   11/22/2015 11:30 AM

## 2015-11-22 ENCOUNTER — Inpatient Hospital Stay (HOSPITAL_BASED_OUTPATIENT_CLINIC_OR_DEPARTMENT_OTHER): Payer: Commercial Managed Care - HMO | Admitting: Oncology

## 2015-11-22 ENCOUNTER — Inpatient Hospital Stay: Payer: Commercial Managed Care - HMO

## 2015-11-22 VITALS — BP 143/85 | HR 65 | Temp 94.7°F | Resp 18 | Ht 63.0 in | Wt 185.7 lb

## 2015-11-22 DIAGNOSIS — M25511 Pain in right shoulder: Secondary | ICD-10-CM

## 2015-11-22 DIAGNOSIS — D693 Immune thrombocytopenic purpura: Secondary | ICD-10-CM | POA: Diagnosis not present

## 2015-11-22 DIAGNOSIS — I10 Essential (primary) hypertension: Secondary | ICD-10-CM | POA: Diagnosis not present

## 2015-11-22 DIAGNOSIS — F17210 Nicotine dependence, cigarettes, uncomplicated: Secondary | ICD-10-CM

## 2015-11-22 DIAGNOSIS — Z79899 Other long term (current) drug therapy: Secondary | ICD-10-CM

## 2015-11-22 DIAGNOSIS — D696 Thrombocytopenia, unspecified: Secondary | ICD-10-CM

## 2015-11-22 LAB — CBC WITH DIFFERENTIAL/PLATELET
BASOS ABS: 0.1 10*3/uL (ref 0–0.1)
Basophils Relative: 1 %
EOS ABS: 0 10*3/uL (ref 0–0.7)
EOS PCT: 0 %
HCT: 41.5 % (ref 35.0–47.0)
Hemoglobin: 14.5 g/dL (ref 12.0–16.0)
Lymphocytes Relative: 18 %
Lymphs Abs: 1.8 10*3/uL (ref 1.0–3.6)
MCH: 29.5 pg (ref 26.0–34.0)
MCHC: 34.9 g/dL (ref 32.0–36.0)
MCV: 84.5 fL (ref 80.0–100.0)
MONO ABS: 0.6 10*3/uL (ref 0.2–0.9)
Monocytes Relative: 6 %
Neutro Abs: 7.5 10*3/uL — ABNORMAL HIGH (ref 1.4–6.5)
Neutrophils Relative %: 75 %
PLATELETS: 113 10*3/uL — AB (ref 150–440)
RBC: 4.91 MIL/uL (ref 3.80–5.20)
RDW: 15.2 % — AB (ref 11.5–14.5)
WBC: 10.1 10*3/uL (ref 3.6–11.0)

## 2015-11-22 NOTE — Progress Notes (Signed)
Pt reports no active bleeding.  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  Pain to right shoulder has increased since stopping steroids.

## 2015-11-24 ENCOUNTER — Other Ambulatory Visit: Payer: Self-pay | Admitting: Surgery

## 2015-11-24 DIAGNOSIS — M7551 Bursitis of right shoulder: Secondary | ICD-10-CM

## 2015-11-24 DIAGNOSIS — M7581 Other shoulder lesions, right shoulder: Secondary | ICD-10-CM

## 2015-12-09 ENCOUNTER — Ambulatory Visit (INDEPENDENT_AMBULATORY_CARE_PROVIDER_SITE_OTHER): Payer: Commercial Managed Care - HMO | Admitting: Family Medicine

## 2015-12-09 ENCOUNTER — Telehealth: Payer: Self-pay | Admitting: *Deleted

## 2015-12-09 ENCOUNTER — Encounter: Payer: Self-pay | Admitting: Family Medicine

## 2015-12-09 VITALS — BP 127/79 | HR 72 | Temp 98.5°F | Resp 16 | Ht 63.5 in

## 2015-12-09 DIAGNOSIS — M545 Low back pain, unspecified: Secondary | ICD-10-CM

## 2015-12-09 DIAGNOSIS — J019 Acute sinusitis, unspecified: Secondary | ICD-10-CM | POA: Diagnosis not present

## 2015-12-09 MED ORDER — CYCLOBENZAPRINE HCL 10 MG PO TABS: 10.0000 mg | ORAL_TABLET | Freq: Three times a day (TID) | ORAL | 0 refills | Status: DC | PRN

## 2015-12-09 MED ORDER — TRAMADOL HCL 50 MG PO TABS: 50.0000 mg | ORAL_TABLET | Freq: Three times a day (TID) | ORAL | 0 refills | Status: DC | PRN

## 2015-12-09 MED ORDER — AMOXICILLIN-POT CLAVULANATE 875-125 MG PO TABS: 1.0000 | ORAL_TABLET | Freq: Two times a day (BID) | ORAL | 0 refills | Status: DC

## 2015-12-09 MED ORDER — FLUTICASONE PROPIONATE 50 MCG/ACT NA SUSP: 2.0000 | Freq: Every day | NASAL | 6 refills | Status: DC

## 2015-12-09 NOTE — Telephone Encounter (Signed)
Called to report that she is having low back pain "from where I had that spinal tap" It has started hurting a week ago Bone Marrow Biopsy was performed on her in April. Her latest plat count was 11/22/15 and was 113. Please advise.  

## 2015-12-09 NOTE — Telephone Encounter (Signed)
Seen by PCP

## 2015-12-09 NOTE — Progress Notes (Signed)
Name: Erica Bartlett   MRN: [id number]    DOB: [birth year]   Date:12/09/2015       Progress Note  Subjective  Chief Complaint  Chief Complaint  Patient presents with  . Back Pain  . Sinusitis    HPI Here c/o nasal congestion and sinus pressure for past 5-6 days.  Mucus is yellow.  Lots of PND.  No fever.  Some cough (throaty).    Low back pain after sneezing 5 days ago.  Pain now with movement and any activity.  No neurologic sx.  She cannot take NSAIDs because of ITP.  No problem-specific Assessment & Plan notes found for this encounter.   Past Medical History:  Diagnosis Date  . Hypertension     Social History  Substance Use Topics  . Smoking status: Current Some Day Smoker  . Smokeless tobacco: Never Used  . Alcohol use No     Current Outpatient Prescriptions:  .  acetaminophen (TYLENOL) 500 MG tablet, Take 500 mg by mouth every 6 (six) hours as needed., Disp: , Rfl:  .  amLODipine (NORVASC) 10 MG tablet, TAKE 1 TABLET (10 MG TOTAL) BY MOUTH DAILY., Disp: 30 tablet, Rfl: 12 .  clonazePAM (KLONOPIN) 1 MG tablet, Take 1-2 mg by mouth 3 (three) times daily as needed. Reported on 10/11/2015, Disp: , Rfl:  .  clotrimazole-betamethasone (LOTRISONE) cream, Apply 1 application topically 2 (two) times daily., Disp: 45 g, Rfl: 2 .  hydrochlorothiazide (HYDRODIURIL) 25 MG tablet, Take 1 tablet (25 mg total) by mouth daily., Disp: 30 tablet, Rfl: 12 .  gabapentin (NEURONTIN) 600 MG tablet, Take 600 mg by mouth 4 (four) times daily., Disp: , Rfl: 0 .  pregabalin (LYRICA) 75 MG capsule, Take 1 capsule (75 mg total) by mouth 3 (three) times daily. (Patient not taking: Reported on 11/22/2015), Disp: 90 capsule, Rfl: 5  Not on File  Review of Systems  Constitutional: Negative for chills, fever, malaise/fatigue and weight loss.  HENT: Positive for congestion. Negative for hearing loss.   Eyes: Negative for blurred vision and double vision.  Respiratory: Positive for cough. Negative  for shortness of breath and wheezing.   Cardiovascular: Negative for chest pain, palpitations and leg swelling.  Gastrointestinal: Negative for abdominal pain, blood in stool and heartburn.  Genitourinary: Negative for dysuria, frequency and urgency.  Skin: Negative for rash.  Neurological: Negative for weakness and headaches.       Fibromyalgia      Objective  Vitals:   12/09/15 1031  BP: 127/79  Pulse: 72  Resp: 16  Temp: 98.5 F (36.9 C)  TempSrc: Oral  Height: 5' 3.5" (1.613 m)     Physical Exam  Constitutional: She is well-developed, well-nourished, and in no distress. No distress.  HENT:  Head: Normocephalic and atraumatic.  Right Ear: External ear normal.  Left Ear: External ear normal.  Nose: Mucosal edema and rhinorrhea present. Right sinus exhibits maxillary sinus tenderness and frontal sinus tenderness. Left sinus exhibits maxillary sinus tenderness and frontal sinus tenderness.  Mouth/Throat: Oropharynx is clear and moist.  Cardiovascular: Normal rate, regular rhythm and normal heart sounds.   Pulmonary/Chest: Effort normal and breath sounds normal.  Musculoskeletal:  Pain to palpation over lower lumbar spine and just a little laterally (each side).  + muscular spasm  Vitals reviewed.     Recent Results (from the past 2160 hour(s))  CBC with Differential/Platelet     Status: Abnormal   Collection Time: 09/14/15 10:38 AM  Result Value  Ref Range   WBC 13.2 (H) 3.6 - 11.0 K/uL   RBC 5.02 3.80 - 5.20 MIL/uL   Hemoglobin 14.6 12.0 - 16.0 g/dL   HCT 42.8 35.0 - 47.0 %   MCV 85.2 80.0 - 100.0 fL   MCH 29.0 26.0 - 34.0 pg   MCHC 34.0 32.0 - 36.0 g/dL   RDW 15.4 (H) 11.5 - 14.5 %   Platelets 68 (L) 150 - 440 K/uL   Neutrophils Relative % 87 %   Neutro Abs 11.4 (H) 1.4 - 6.5 K/uL   Lymphocytes Relative 10 %   Lymphs Abs 1.3 1.0 - 3.6 K/uL   Monocytes Relative 3 %   Monocytes Absolute 0.4 0.2 - 0.9 K/uL   Eosinophils Relative 0 %   Eosinophils Absolute  0.0 0 - 0.7 K/uL   Basophils Relative 0 %   Basophils Absolute 0.1 0 - 0.1 K/uL  Sample to Blood Bank     Status: None   Collection Time: 09/14/15 10:38 AM  Result Value Ref Range   Blood Bank Specimen SAMPLE AVAILABLE FOR TESTING    Sample Expiration 09/17/2015   CBC with Differential/Platelet     Status: Abnormal   Collection Time: 09/22/15  9:03 AM  Result Value Ref Range   WBC 7.5 3.6 - 11.0 K/uL   RBC 4.81 3.80 - 5.20 MIL/uL   Hemoglobin 14.1 12.0 - 16.0 g/dL   HCT 41.2 35.0 - 47.0 %   MCV 85.7 80.0 - 100.0 fL   MCH 29.2 26.0 - 34.0 pg   MCHC 34.1 32.0 - 36.0 g/dL   RDW 15.4 (H) 11.5 - 14.5 %   Platelets 46 (L) 150 - 440 K/uL   Neutrophils Relative % 60 %   Neutro Abs 4.6 1.4 - 6.5 K/uL   Lymphocytes Relative 33 %   Lymphs Abs 2.5 1.0 - 3.6 K/uL   Monocytes Relative 5 %   Monocytes Absolute 0.4 0.2 - 0.9 K/uL   Eosinophils Relative 1 %   Eosinophils Absolute 0.0 0 - 0.7 K/uL   Basophils Relative 1 %   Basophils Absolute 0.1 0 - 0.1 K/uL  Sample to Blood Bank     Status: None   Collection Time: 09/22/15  9:04 AM  Result Value Ref Range   Blood Bank Specimen SAMPLE AVAILABLE FOR TESTING    Sample Expiration 09/25/2015   CBC with Differential/Platelet     Status: Abnormal   Collection Time: 09/28/15 10:05 AM  Result Value Ref Range   WBC 7.8 3.6 - 11.0 K/uL   RBC 5.02 3.80 - 5.20 MIL/uL   Hemoglobin 14.7 12.0 - 16.0 g/dL   HCT 43.1 35.0 - 47.0 %   MCV 85.8 80.0 - 100.0 fL   MCH 29.4 26.0 - 34.0 pg   MCHC 34.2 32.0 - 36.0 g/dL   RDW 15.8 (H) 11.5 - 14.5 %   Platelets 44 (L) 150 - 440 K/uL   Neutrophils Relative % 63 %   Neutro Abs 4.9 1.4 - 6.5 K/uL   Lymphocytes Relative 30 %   Lymphs Abs 2.4 1.0 - 3.6 K/uL   Monocytes Relative 5 %   Monocytes Absolute 0.4 0.2 - 0.9 K/uL   Eosinophils Relative 1 %   Eosinophils Absolute 0.0 0 - 0.7 K/uL   Basophils Relative 1 %   Basophils Absolute 0.1 0 - 0.1 K/uL  Sample to Blood Bank     Status: None   Collection Time:  09/28/15 10:05 AM  Result  Value Ref Range   Blood Bank Specimen SAMPLE AVAILABLE FOR TESTING    Sample Expiration 10/01/2015   CBC with Differential/Platelet     Status: Abnormal   Collection Time: 10/04/15  8:26 AM  Result Value Ref Range   WBC 9.2 3.6 - 11.0 K/uL   RBC 5.10 3.80 - 5.20 MIL/uL   Hemoglobin 15.0 12.0 - 16.0 g/dL   HCT 43.5 35.0 - 47.0 %   MCV 85.3 80.0 - 100.0 fL   MCH 29.4 26.0 - 34.0 pg   MCHC 34.5 32.0 - 36.0 g/dL   RDW 15.3 (H) 11.5 - 14.5 %   Platelets 56 (L) 150 - 440 K/uL   Neutrophils Relative % 66 %   Neutro Abs 6.0 1.4 - 6.5 K/uL   Lymphocytes Relative 27 %   Lymphs Abs 2.5 1.0 - 3.6 K/uL   Monocytes Relative 5 %   Monocytes Absolute 0.5 0.2 - 0.9 K/uL   Eosinophils Relative 1 %   Eosinophils Absolute 0.1 0 - 0.7 K/uL   Basophils Relative 1 %   Basophils Absolute 0.1 0 - 0.1 K/uL  Hold Tube- Blood Bank     Status: None   Collection Time: 10/04/15  8:26 AM  Result Value Ref Range   Blood Bank Specimen SAMPLE AVAILABLE FOR TESTING    Sample Expiration 10/07/2015   CBC with Differential/Platelet     Status: Abnormal   Collection Time: 10/11/15 10:25 AM  Result Value Ref Range   WBC 7.4 3.6 - 11.0 K/uL   RBC 5.08 3.80 - 5.20 MIL/uL   Hemoglobin 14.8 12.0 - 16.0 g/dL   HCT 43.2 35.0 - 47.0 %   MCV 85.0 80.0 - 100.0 fL   MCH 29.2 26.0 - 34.0 pg   MCHC 34.4 32.0 - 36.0 g/dL   RDW 15.4 (H) 11.5 - 14.5 %   Platelets 81 (L) 150 - 440 K/uL   Neutrophils Relative % 67 %   Neutro Abs 5.0 1.4 - 6.5 K/uL   Lymphocytes Relative 24 %   Lymphs Abs 1.8 1.0 - 3.6 K/uL   Monocytes Relative 7 %   Monocytes Absolute 0.5 0.2 - 0.9 K/uL   Eosinophils Relative 1 %   Eosinophils Absolute 0.1 0 - 0.7 K/uL   Basophils Relative 1 %   Basophils Absolute 0.1 0 - 0.1 K/uL  Hold Tube- Blood Bank     Status: None   Collection Time: 10/11/15 10:26 AM  Result Value Ref Range   Blood Bank Specimen SAMPLE AVAILABLE FOR TESTING    Sample Expiration 10/14/2015   CBC with  Differential     Status: Abnormal   Collection Time: 10/18/15  8:10 AM  Result Value Ref Range   WBC 5.1 3.6 - 11.0 K/uL   RBC 4.95 3.80 - 5.20 MIL/uL   Hemoglobin 14.6 12.0 - 16.0 g/dL   HCT 41.9 35.0 - 47.0 %   MCV 84.7 80.0 - 100.0 fL   MCH 29.5 26.0 - 34.0 pg   MCHC 34.8 32.0 - 36.0 g/dL   RDW 14.9 (H) 11.5 - 14.5 %   Platelets 82 (L) 150 - 440 K/uL   Neutrophils Relative % 59 %   Neutro Abs 3.0 1.4 - 6.5 K/uL   Lymphocytes Relative 27 %   Lymphs Abs 1.4 1.0 - 3.6 K/uL   Monocytes Relative 11 %   Monocytes Absolute 0.6 0.2 - 0.9 K/uL   Eosinophils Relative 2 %   Eosinophils Absolute 0.1 0 -  0.7 K/uL   Basophils Relative 1 %   Basophils Absolute 0.0 0 - 0.1 K/uL  CBC with Differential     Status: Abnormal   Collection Time: 10/25/15  9:50 AM  Result Value Ref Range   WBC 5.8 3.6 - 11.0 K/uL   RBC 4.80 3.80 - 5.20 MIL/uL   Hemoglobin 14.0 12.0 - 16.0 g/dL   HCT 40.7 35.0 - 47.0 %   MCV 84.8 80.0 - 100.0 fL   MCH 29.3 26.0 - 34.0 pg   MCHC 34.5 32.0 - 36.0 g/dL   RDW 15.3 (H) 11.5 - 14.5 %   Platelets 127 (L) 150 - 440 K/uL   Neutrophils Relative % 53 %   Neutro Abs 3.1 1.4 - 6.5 K/uL   Lymphocytes Relative 34 %   Lymphs Abs 1.9 1.0 - 3.6 K/uL   Monocytes Relative 9 %   Monocytes Absolute 0.5 0.2 - 0.9 K/uL   Eosinophils Relative 3 %   Eosinophils Absolute 0.2 0 - 0.7 K/uL   Basophils Relative 1 %   Basophils Absolute 0.1 0 - 0.1 K/uL  CBC with Differential/Platelet     Status: Abnormal   Collection Time: 11/03/15  2:55 PM  Result Value Ref Range   WBC 11.2 (H) 3.6 - 11.0 K/uL   RBC 5.03 3.80 - 5.20 MIL/uL   Hemoglobin 14.5 12.0 - 16.0 g/dL   HCT 42.5 35.0 - 47.0 %   MCV 84.5 80.0 - 100.0 fL   MCH 28.9 26.0 - 34.0 pg   MCHC 34.2 32.0 - 36.0 g/dL   RDW 14.9 (H) 11.5 - 14.5 %   Platelets 108 (L) 150 - 440 K/uL   Neutrophils Relative % 70 %   Neutro Abs 7.8 (H) 1.4 - 6.5 K/uL   Lymphocytes Relative 23 %   Lymphs Abs 2.5 1.0 - 3.6 K/uL   Monocytes Relative 5 %    Monocytes Absolute 0.6 0.2 - 0.9 K/uL   Eosinophils Relative 1 %   Eosinophils Absolute 0.1 0 - 0.7 K/uL   Basophils Relative 1 %   Basophils Absolute 0.1 0 - 0.1 K/uL  CBC with Differential/Platelet     Status: Abnormal   Collection Time: 11/08/15  8:05 AM  Result Value Ref Range   WBC 8.3 3.6 - 11.0 K/uL   RBC 4.81 3.80 - 5.20 MIL/uL   Hemoglobin 14.2 12.0 - 16.0 g/dL   HCT 40.7 35.0 - 47.0 %   MCV 84.7 80.0 - 100.0 fL   MCH 29.5 26.0 - 34.0 pg   MCHC 34.8 32.0 - 36.0 g/dL   RDW 14.8 (H) 11.5 - 14.5 %   Platelets 105 (L) 150 - 440 K/uL   Neutrophils Relative % 69 %   Neutro Abs 5.7 1.4 - 6.5 K/uL   Lymphocytes Relative 23 %   Lymphs Abs 1.9 1.0 - 3.6 K/uL   Monocytes Relative 5 %   Monocytes Absolute 0.4 0.2 - 0.9 K/uL   Eosinophils Relative 2 %   Eosinophils Absolute 0.2 0 - 0.7 K/uL   Basophils Relative 1 %   Basophils Absolute 0.1 0 - 0.1 K/uL  CBC with Differential/Platelet     Status: Abnormal   Collection Time: 11/22/15 10:20 AM  Result Value Ref Range   WBC 10.1 3.6 - 11.0 K/uL   RBC 4.91 3.80 - 5.20 MIL/uL   Hemoglobin 14.5 12.0 - 16.0 g/dL   HCT 41.5 35.0 - 47.0 %   MCV 84.5 80.0 - 100.0  fL   MCH 29.5 26.0 - 34.0 pg   MCHC 34.9 32.0 - 36.0 g/dL   RDW 15.2 (H) 11.5 - 14.5 %   Platelets 113 (L) 150 - 440 K/uL   Neutrophils Relative % 75 %   Neutro Abs 7.5 (H) 1.4 - 6.5 K/uL   Lymphocytes Relative 18 %   Lymphs Abs 1.8 1.0 - 3.6 K/uL   Monocytes Relative 6 %   Monocytes Absolute 0.6 0.2 - 0.9 K/uL   Eosinophils Relative 0 %   Eosinophils Absolute 0.0 0 - 0.7 K/uL   Basophils Relative 1 %   Basophils Absolute 0.1 0 - 0.1 K/uL     Assessment & Plan  1. Acute sinusitis, recurrence not specified, unspecified location  - amoxicillin-clavulanate (AUGMENTIN) 875-125 MG tablet; Take 1 tablet by mouth 2 (two) times daily.  Dispense: 20 tablet; Refill: 0 - fluticasone (FLONASE) 50 MCG/ACT nasal spray; Place 2 sprays into both nostrils daily.  Dispense: 16 g;  Refill: 6  2. Midline low back pain without sciatica  - cyclobenzaprine (FLEXERIL) 10 MG tablet; Take 1 tablet (10 mg total) by mouth 3 (three) times daily as needed for muscle spasms.  Dispense: 30 tablet; Refill: 0 - traMADol (ULTRAM) 50 MG tablet; Take 1 tablet (50 mg total) by mouth every 8 (eight) hours as needed.  Dispense: 30 tablet; Refill: 0

## 2015-12-10 ENCOUNTER — Ambulatory Visit
Admission: RE | Admit: 2015-12-10 | Discharge: 2015-12-10 | Disposition: A | Discharge status: Home / Self Care | Payer: Commercial Managed Care - HMO | Source: Ambulatory Visit | Attending: Oncology | Admitting: Oncology

## 2015-12-10 ENCOUNTER — Telehealth: Payer: Self-pay | Admitting: *Deleted

## 2015-12-10 ENCOUNTER — Other Ambulatory Visit: Payer: Self-pay | Admitting: Surgery

## 2015-12-10 ENCOUNTER — Telehealth: Payer: Self-pay | Admitting: Family Medicine

## 2015-12-10 DIAGNOSIS — M7551 Bursitis of right shoulder: Secondary | ICD-10-CM

## 2015-12-10 DIAGNOSIS — M7581 Other shoulder lesions, right shoulder: Secondary | ICD-10-CM

## 2015-12-10 MED ORDER — BENZONATATE 100 MG PO CAPS: 100.0000 mg | ORAL_CAPSULE | Freq: Three times a day (TID) | ORAL | 0 refills | Status: DC | PRN

## 2015-12-10 NOTE — Telephone Encounter (Signed)
Called and asked for med to be able to do her MRI, states she is at MRI now. When I checked computer, it says that MRI today was cancelled. Please advise

## 2015-12-10 NOTE — Telephone Encounter (Signed)
Advised her that she will have to call [facility] office since she ordered thsi exam

## 2015-12-10 NOTE — Telephone Encounter (Signed)
Pt. Called requesting a cough syrup for her coughing. Pt  Call back  # is  [phone]

## 2015-12-10 NOTE — Telephone Encounter (Signed)
I don't see where this patient is on a cough syrup already. Did she have a particular medication she needed a refill of? She can get Delsym or Robitussin OTC. I can also send Tessalon Perles to her pharmacy if needed.  Thanks! AK

## 2015-12-10 NOTE — Telephone Encounter (Signed)
Patient ok with Erica Bartlett.

## 2015-12-13 ENCOUNTER — Telehealth: Payer: Self-pay | Admitting: Family Medicine

## 2015-12-13 NOTE — Telephone Encounter (Signed)
She could take up to 2  Tramadol at a time if needed.  If she is back working, she may have to be out and rest if she is not getting enough pain relief.  There is nothing to stop all the pain immediately.-jh

## 2015-12-13 NOTE — Telephone Encounter (Signed)
Pt said the tramadol is not helping her back.  It may be worse this week.  Please call [phone]

## 2015-12-14 ENCOUNTER — Ambulatory Visit: Payer: Commercial Managed Care - HMO

## 2015-12-14 ENCOUNTER — Other Ambulatory Visit: Payer: Self-pay | Admitting: Family Medicine

## 2015-12-14 MED ORDER — BACLOFEN 10 MG PO TABS: 10.0000 mg | ORAL_TABLET | Freq: Three times a day (TID) | ORAL | 1 refills | Status: DC

## 2015-12-14 NOTE — Telephone Encounter (Signed)
Pt has advised as per Dr. Luan Pulling but had been taking 2 tramadol and still has too much pain.

## 2015-12-14 NOTE — Telephone Encounter (Signed)
Lets try stopping Cyclobenzaprine and changing to Baclofen, 10 mg., 1 three times a day for back pain and muscle spasm. #30 /1 refill.-jh

## 2015-12-14 NOTE — Telephone Encounter (Signed)
Pt advised and Rx send.

## 2015-12-17 ENCOUNTER — Ambulatory Visit: Admission: RE | Admit: 2015-12-17 | Payer: Commercial Managed Care - HMO | Source: Ambulatory Visit

## 2015-12-20 ENCOUNTER — Inpatient Hospital Stay: Payer: Commercial Managed Care - HMO | Attending: Oncology

## 2015-12-20 DIAGNOSIS — Z79899 Other long term (current) drug therapy: Secondary | ICD-10-CM | POA: Insufficient documentation

## 2015-12-20 DIAGNOSIS — M25511 Pain in right shoulder: Secondary | ICD-10-CM | POA: Insufficient documentation

## 2015-12-20 DIAGNOSIS — I10 Essential (primary) hypertension: Secondary | ICD-10-CM | POA: Insufficient documentation

## 2015-12-20 DIAGNOSIS — F17210 Nicotine dependence, cigarettes, uncomplicated: Secondary | ICD-10-CM | POA: Insufficient documentation

## 2015-12-20 DIAGNOSIS — D693 Immune thrombocytopenic purpura: Secondary | ICD-10-CM | POA: Diagnosis not present

## 2015-12-20 DIAGNOSIS — D696 Thrombocytopenia, unspecified: Secondary | ICD-10-CM

## 2015-12-20 LAB — CBC WITH DIFFERENTIAL/PLATELET
BASOS ABS: 0.1 10*3/uL (ref 0–0.1)
BASOS PCT: 1 %
EOS PCT: 6 %
Eosinophils Absolute: 0.4 10*3/uL (ref 0–0.7)
HCT: 41.5 % (ref 35.0–47.0)
Hemoglobin: 14.4 g/dL (ref 12.0–16.0)
LYMPHS PCT: 23 %
Lymphs Abs: 1.4 10*3/uL (ref 1.0–3.6)
MCH: 29.1 pg (ref 26.0–34.0)
MCHC: 34.6 g/dL (ref 32.0–36.0)
MCV: 84.3 fL (ref 80.0–100.0)
Monocytes Absolute: 0.4 10*3/uL (ref 0.2–0.9)
Monocytes Relative: 6 %
Neutro Abs: 3.8 10*3/uL (ref 1.4–6.5)
Neutrophils Relative %: 64 %
PLATELETS: 130 10*3/uL — AB (ref 150–440)
RBC: 4.93 MIL/uL (ref 3.80–5.20)
RDW: 14.2 % (ref 11.5–14.5)
WBC: 6.1 10*3/uL (ref 3.6–11.0)

## 2015-12-23 ENCOUNTER — Encounter: Payer: Self-pay | Admitting: Family Medicine

## 2015-12-23 ENCOUNTER — Ambulatory Visit (INDEPENDENT_AMBULATORY_CARE_PROVIDER_SITE_OTHER): Payer: Commercial Managed Care - HMO | Admitting: Family Medicine

## 2015-12-23 VITALS — BP 129/81 | HR 88 | Temp 98.9°F | Resp 16 | Ht 63.5 in | Wt 185.0 lb

## 2015-12-23 DIAGNOSIS — D693 Immune thrombocytopenic purpura: Secondary | ICD-10-CM | POA: Diagnosis not present

## 2015-12-23 DIAGNOSIS — J329 Chronic sinusitis, unspecified: Secondary | ICD-10-CM

## 2015-12-23 DIAGNOSIS — J320 Chronic maxillary sinusitis: Secondary | ICD-10-CM | POA: Insufficient documentation

## 2015-12-23 DIAGNOSIS — F329 Major depressive disorder, single episode, unspecified: Secondary | ICD-10-CM

## 2015-12-23 DIAGNOSIS — M797 Fibromyalgia: Secondary | ICD-10-CM

## 2015-12-23 DIAGNOSIS — F32A Depression, unspecified: Secondary | ICD-10-CM

## 2015-12-23 MED ORDER — VENLAFAXINE HCL ER 37.5 MG PO CP24: 37.5000 mg | ORAL_CAPSULE | Freq: Every day | ORAL | 0 refills | Status: DC

## 2015-12-23 MED ORDER — VENLAFAXINE HCL ER 75 MG PO CP24: 75.0000 mg | ORAL_CAPSULE | Freq: Every day | ORAL | 3 refills | Status: DC

## 2015-12-23 NOTE — Progress Notes (Signed)
Name: Erica Bartlett   MRN: [id number]    DOB: [birth year]   Date:12/23/2015       Progress Note  Subjective  Chief Complaint  Chief Complaint  Patient presents with  . Sinusitis    HPI Patient says that her sinusitis has never resolved.  Finished her course of Augmentin.   Still coughing up brown post nasal drip.  She also feels that she is wheezing.  She is much more emotional recently about the loss of her husband and the donation of his tissue.  She is feeling much more depressed.   Not sleeping well; More emotional.  No problem-specific Assessment & Plan notes found for this encounter.   Past Medical History:  Diagnosis Date  . Hypertension     Past Surgical History:  Procedure Laterality Date  . ABDOMINAL HYSTERECTOMY    . CESAREAN SECTION    . cyst removed     breast, benign    No family history on file.  Social History   Social History  . Marital status: Widowed    Spouse name: N/A  . Number of children: N/A  . Years of education: N/A   Occupational History  . Not on file.   Social History Main Topics  . Smoking status: Current Some Day Smoker  . Smokeless tobacco: Never Used  . Alcohol use No  . Drug use: No  . Sexual activity: Yes    Birth control/ protection: Post-menopausal   Other Topics Concern  . Not on file   Social History Narrative  . No narrative on file     Current Outpatient Prescriptions:  .  acetaminophen (TYLENOL) 500 MG tablet, Take 500 mg by mouth every 6 (six) hours as needed., Disp: , Rfl:  .  amLODipine (NORVASC) 10 MG tablet, TAKE 1 TABLET (10 MG TOTAL) BY MOUTH DAILY., Disp: 30 tablet, Rfl: 12 .  baclofen (LIORESAL) 10 MG tablet, Take 1 tablet (10 mg total) by mouth 3 (three) times daily., Disp: 30 each, Rfl: 1 .  benzonatate (TESSALON) 100 MG capsule, Take 1 capsule (100 mg total) by mouth 3 (three) times daily as needed., Disp: 30 capsule, Rfl: 0 .  clonazePAM (KLONOPIN) 1 MG tablet, Take 1-2 mg by mouth 3 (three)  times daily as needed. Reported on 10/11/2015, Disp: , Rfl:  .  clotrimazole-betamethasone (LOTRISONE) cream, Apply 1 application topically 2 (two) times daily., Disp: 45 g, Rfl: 2 .  cyclobenzaprine (FLEXERIL) 10 MG tablet, Take 1 tablet (10 mg total) by mouth 3 (three) times daily as needed for muscle spasms., Disp: 30 tablet, Rfl: 0 .  fluticasone (FLONASE) 50 MCG/ACT nasal spray, Place 2 sprays into both nostrils daily., Disp: 16 g, Rfl: 6 .  gabapentin (NEURONTIN) 600 MG tablet, Take 600 mg by mouth 4 (four) times daily., Disp: , Rfl: 0 .  hydrochlorothiazide (HYDRODIURIL) 25 MG tablet, Take 1 tablet (25 mg total) by mouth daily., Disp: 30 tablet, Rfl: 12 .  traMADol (ULTRAM) 50 MG tablet, Take 1 tablet (50 mg total) by mouth every 8 (eight) hours as needed., Disp: 30 tablet, Rfl: 0 .  pregabalin (LYRICA) 75 MG capsule, Take 1 capsule (75 mg total) by mouth 3 (three) times daily. (Patient not taking: Reported on 12/23/2015), Disp: 90 capsule, Rfl: 5 .  venlafaxine XR (EFFEXOR XR) 37.5 MG 24 hr capsule, Take 1 capsule (37.5 mg total) by mouth daily with breakfast., Disp: 7 capsule, Rfl: 0 .  venlafaxine XR (EFFEXOR XR) 75 MG 24 hr  capsule, Take 1 capsule (75 mg total) by mouth daily with breakfast., Disp: 30 capsule, Rfl: 3  Not on File   Review of Systems  Constitutional: Positive for malaise/fatigue. Negative for chills, fever and weight loss.  HENT: Positive for congestion. Negative for hearing loss.   Eyes: Negative for blurred vision and double vision.  Respiratory: Positive for cough and wheezing (?).   Cardiovascular: Negative for chest pain, palpitations and leg swelling.  Gastrointestinal: Negative for abdominal pain, blood in stool and heartburn.  Genitourinary: Negative for dysuria, frequency and urgency.  Musculoskeletal: Positive for myalgias.  Skin: Negative for rash.  Neurological: Negative for weakness and headaches.      Objective  Vitals:   12/23/15 1322  BP:  129/81  Pulse: 88  Resp: 16  Temp: 98.9 F (37.2 C)  TempSrc: Oral  Weight: 185 lb (83.9 kg)  Height: 5' 3.5" (1.613 m)    Physical Exam  Constitutional: She is oriented to person, place, and time and well-developed, well-nourished, and in no distress. No distress.  HENT:  Head: Normocephalic and atraumatic.  Right Ear: External ear normal.  Left Ear: External ear normal.  Nose: Mucosal edema and rhinorrhea present. Right sinus exhibits maxillary sinus tenderness and frontal sinus tenderness. Left sinus exhibits maxillary sinus tenderness and frontal sinus tenderness.  Mouth/Throat: Oropharynx is clear and moist.  Eyes: Conjunctivae and EOM are normal. Pupils are equal, round, and reactive to light.  Neck: Normal range of motion. Neck supple. Carotid bruit is not present. No thyromegaly present.  Cardiovascular: Normal rate, regular rhythm and normal heart sounds.  Exam reveals no gallop and no friction rub.   No murmur heard. Pulmonary/Chest: Effort normal and breath sounds normal. No respiratory distress. She has no wheezes. She has no rales.  Musculoskeletal: She exhibits no edema.  Lymphadenopathy:       Head (right side): No submental and no submandibular adenopathy present.       Head (left side): No submental and no submandibular adenopathy present.    She has no cervical adenopathy.       Right cervical: No superficial cervical and no posterior cervical adenopathy present.      Left cervical: No superficial cervical and no posterior cervical adenopathy present.  Neurological: She is alert and oriented to person, place, and time.  Psychiatric: Her mood appears anxious. She exhibits a depressed mood.  Vitals reviewed.      Recent Results (from the past 2160 hour(s))  CBC with Differential/Platelet     Status: Abnormal   Collection Time: 09/28/15 10:05 AM  Result Value Ref Range   WBC 7.8 3.6 - 11.0 K/uL   RBC 5.02 3.80 - 5.20 MIL/uL   Hemoglobin 14.7 12.0 - 16.0 g/dL    HCT 43.1 35.0 - 47.0 %   MCV 85.8 80.0 - 100.0 fL   MCH 29.4 26.0 - 34.0 pg   MCHC 34.2 32.0 - 36.0 g/dL   RDW 15.8 (H) 11.5 - 14.5 %   Platelets 44 (L) 150 - 440 K/uL   Neutrophils Relative % 63 %   Neutro Abs 4.9 1.4 - 6.5 K/uL   Lymphocytes Relative 30 %   Lymphs Abs 2.4 1.0 - 3.6 K/uL   Monocytes Relative 5 %   Monocytes Absolute 0.4 0.2 - 0.9 K/uL   Eosinophils Relative 1 %   Eosinophils Absolute 0.0 0 - 0.7 K/uL   Basophils Relative 1 %   Basophils Absolute 0.1 0 - 0.1 K/uL  Sample to  Blood Bank     Status: None   Collection Time: 09/28/15 10:05 AM  Result Value Ref Range   Blood Bank Specimen SAMPLE AVAILABLE FOR TESTING    Sample Expiration 10/01/2015   CBC with Differential/Platelet     Status: Abnormal   Collection Time: 10/04/15  8:26 AM  Result Value Ref Range   WBC 9.2 3.6 - 11.0 K/uL   RBC 5.10 3.80 - 5.20 MIL/uL   Hemoglobin 15.0 12.0 - 16.0 g/dL   HCT 43.5 35.0 - 47.0 %   MCV 85.3 80.0 - 100.0 fL   MCH 29.4 26.0 - 34.0 pg   MCHC 34.5 32.0 - 36.0 g/dL   RDW 15.3 (H) 11.5 - 14.5 %   Platelets 56 (L) 150 - 440 K/uL   Neutrophils Relative % 66 %   Neutro Abs 6.0 1.4 - 6.5 K/uL   Lymphocytes Relative 27 %   Lymphs Abs 2.5 1.0 - 3.6 K/uL   Monocytes Relative 5 %   Monocytes Absolute 0.5 0.2 - 0.9 K/uL   Eosinophils Relative 1 %   Eosinophils Absolute 0.1 0 - 0.7 K/uL   Basophils Relative 1 %   Basophils Absolute 0.1 0 - 0.1 K/uL  Hold Tube- Blood Bank     Status: None   Collection Time: 10/04/15  8:26 AM  Result Value Ref Range   Blood Bank Specimen SAMPLE AVAILABLE FOR TESTING    Sample Expiration 10/07/2015   CBC with Differential/Platelet     Status: Abnormal   Collection Time: 10/11/15 10:25 AM  Result Value Ref Range   WBC 7.4 3.6 - 11.0 K/uL   RBC 5.08 3.80 - 5.20 MIL/uL   Hemoglobin 14.8 12.0 - 16.0 g/dL   HCT 43.2 35.0 - 47.0 %   MCV 85.0 80.0 - 100.0 fL   MCH 29.2 26.0 - 34.0 pg   MCHC 34.4 32.0 - 36.0 g/dL   RDW 15.4 (H) 11.5 - 14.5 %    Platelets 81 (L) 150 - 440 K/uL   Neutrophils Relative % 67 %   Neutro Abs 5.0 1.4 - 6.5 K/uL   Lymphocytes Relative 24 %   Lymphs Abs 1.8 1.0 - 3.6 K/uL   Monocytes Relative 7 %   Monocytes Absolute 0.5 0.2 - 0.9 K/uL   Eosinophils Relative 1 %   Eosinophils Absolute 0.1 0 - 0.7 K/uL   Basophils Relative 1 %   Basophils Absolute 0.1 0 - 0.1 K/uL  Hold Tube- Blood Bank     Status: None   Collection Time: 10/11/15 10:26 AM  Result Value Ref Range   Blood Bank Specimen SAMPLE AVAILABLE FOR TESTING    Sample Expiration 10/14/2015   CBC with Differential     Status: Abnormal   Collection Time: 10/18/15  8:10 AM  Result Value Ref Range   WBC 5.1 3.6 - 11.0 K/uL   RBC 4.95 3.80 - 5.20 MIL/uL   Hemoglobin 14.6 12.0 - 16.0 g/dL   HCT 41.9 35.0 - 47.0 %   MCV 84.7 80.0 - 100.0 fL   MCH 29.5 26.0 - 34.0 pg   MCHC 34.8 32.0 - 36.0 g/dL   RDW 14.9 (H) 11.5 - 14.5 %   Platelets 82 (L) 150 - 440 K/uL   Neutrophils Relative % 59 %   Neutro Abs 3.0 1.4 - 6.5 K/uL   Lymphocytes Relative 27 %   Lymphs Abs 1.4 1.0 - 3.6 K/uL   Monocytes Relative 11 %   Monocytes Absolute 0.6  0.2 - 0.9 K/uL   Eosinophils Relative 2 %   Eosinophils Absolute 0.1 0 - 0.7 K/uL   Basophils Relative 1 %   Basophils Absolute 0.0 0 - 0.1 K/uL  CBC with Differential     Status: Abnormal   Collection Time: 10/25/15  9:50 AM  Result Value Ref Range   WBC 5.8 3.6 - 11.0 K/uL   RBC 4.80 3.80 - 5.20 MIL/uL   Hemoglobin 14.0 12.0 - 16.0 g/dL   HCT 40.7 35.0 - 47.0 %   MCV 84.8 80.0 - 100.0 fL   MCH 29.3 26.0 - 34.0 pg   MCHC 34.5 32.0 - 36.0 g/dL   RDW 15.3 (H) 11.5 - 14.5 %   Platelets 127 (L) 150 - 440 K/uL   Neutrophils Relative % 53 %   Neutro Abs 3.1 1.4 - 6.5 K/uL   Lymphocytes Relative 34 %   Lymphs Abs 1.9 1.0 - 3.6 K/uL   Monocytes Relative 9 %   Monocytes Absolute 0.5 0.2 - 0.9 K/uL   Eosinophils Relative 3 %   Eosinophils Absolute 0.2 0 - 0.7 K/uL   Basophils Relative 1 %   Basophils Absolute  0.1 0 - 0.1 K/uL  CBC with Differential/Platelet     Status: Abnormal   Collection Time: 11/03/15  2:55 PM  Result Value Ref Range   WBC 11.2 (H) 3.6 - 11.0 K/uL   RBC 5.03 3.80 - 5.20 MIL/uL   Hemoglobin 14.5 12.0 - 16.0 g/dL   HCT 42.5 35.0 - 47.0 %   MCV 84.5 80.0 - 100.0 fL   MCH 28.9 26.0 - 34.0 pg   MCHC 34.2 32.0 - 36.0 g/dL   RDW 14.9 (H) 11.5 - 14.5 %   Platelets 108 (L) 150 - 440 K/uL   Neutrophils Relative % 70 %   Neutro Abs 7.8 (H) 1.4 - 6.5 K/uL   Lymphocytes Relative 23 %   Lymphs Abs 2.5 1.0 - 3.6 K/uL   Monocytes Relative 5 %   Monocytes Absolute 0.6 0.2 - 0.9 K/uL   Eosinophils Relative 1 %   Eosinophils Absolute 0.1 0 - 0.7 K/uL   Basophils Relative 1 %   Basophils Absolute 0.1 0 - 0.1 K/uL  CBC with Differential/Platelet     Status: Abnormal   Collection Time: 11/08/15  8:05 AM  Result Value Ref Range   WBC 8.3 3.6 - 11.0 K/uL   RBC 4.81 3.80 - 5.20 MIL/uL   Hemoglobin 14.2 12.0 - 16.0 g/dL   HCT 40.7 35.0 - 47.0 %   MCV 84.7 80.0 - 100.0 fL   MCH 29.5 26.0 - 34.0 pg   MCHC 34.8 32.0 - 36.0 g/dL   RDW 14.8 (H) 11.5 - 14.5 %   Platelets 105 (L) 150 - 440 K/uL   Neutrophils Relative % 69 %   Neutro Abs 5.7 1.4 - 6.5 K/uL   Lymphocytes Relative 23 %   Lymphs Abs 1.9 1.0 - 3.6 K/uL   Monocytes Relative 5 %   Monocytes Absolute 0.4 0.2 - 0.9 K/uL   Eosinophils Relative 2 %   Eosinophils Absolute 0.2 0 - 0.7 K/uL   Basophils Relative 1 %   Basophils Absolute 0.1 0 - 0.1 K/uL  CBC with Differential/Platelet     Status: Abnormal   Collection Time: 11/22/15 10:20 AM  Result Value Ref Range   WBC 10.1 3.6 - 11.0 K/uL   RBC 4.91 3.80 - 5.20 MIL/uL   Hemoglobin 14.5 12.0 -  16.0 g/dL   HCT 41.5 35.0 - 47.0 %   MCV 84.5 80.0 - 100.0 fL   MCH 29.5 26.0 - 34.0 pg   MCHC 34.9 32.0 - 36.0 g/dL   RDW 15.2 (H) 11.5 - 14.5 %   Platelets 113 (L) 150 - 440 K/uL   Neutrophils Relative % 75 %   Neutro Abs 7.5 (H) 1.4 - 6.5 K/uL   Lymphocytes Relative 18 %    Lymphs Abs 1.8 1.0 - 3.6 K/uL   Monocytes Relative 6 %   Monocytes Absolute 0.6 0.2 - 0.9 K/uL   Eosinophils Relative 0 %   Eosinophils Absolute 0.0 0 - 0.7 K/uL   Basophils Relative 1 %   Basophils Absolute 0.1 0 - 0.1 K/uL  CBC with Differential/Platelet     Status: Abnormal   Collection Time: 12/20/15 10:11 AM  Result Value Ref Range   WBC 6.1 3.6 - 11.0 K/uL   RBC 4.93 3.80 - 5.20 MIL/uL   Hemoglobin 14.4 12.0 - 16.0 g/dL   HCT 41.5 35.0 - 47.0 %   MCV 84.3 80.0 - 100.0 fL   MCH 29.1 26.0 - 34.0 pg   MCHC 34.6 32.0 - 36.0 g/dL   RDW 14.2 11.5 - 14.5 %   Platelets 130 (L) 150 - 440 K/uL   Neutrophils Relative % 64 %   Neutro Abs 3.8 1.4 - 6.5 K/uL   Lymphocytes Relative 23 %   Lymphs Abs 1.4 1.0 - 3.6 K/uL   Monocytes Relative 6 %   Monocytes Absolute 0.4 0.2 - 0.9 K/uL   Eosinophils Relative 6 %   Eosinophils Absolute 0.4 0 - 0.7 K/uL   Basophils Relative 1 %   Basophils Absolute 0.1 0 - 0.1 K/uL     Assessment & Plan  Problem List Items Addressed This Visit      Respiratory   Sinusitis   Relevant Orders   Ambulatory referral to ENT     Musculoskeletal and Integument   Fibromyalgia     Hematopoietic and Hemostatic   ITP (idiopathic thrombocytopenic purpura) - Primary     Other   Depression   Relevant Medications   venlafaxine XR (EFFEXOR XR) 37.5 MG 24 hr capsule   venlafaxine XR (EFFEXOR XR) 75 MG 24 hr capsule    Other Visit Diagnoses   None.     Meds ordered this encounter  Medications  . venlafaxine XR (EFFEXOR XR) 37.5 MG 24 hr capsule    Sig: Take 1 capsule (37.5 mg total) by mouth daily with breakfast.    Dispense:  7 capsule    Refill:  0  . venlafaxine XR (EFFEXOR XR) 75 MG 24 hr capsule    Sig: Take 1 capsule (75 mg total) by mouth daily with breakfast.    Dispense:  30 capsule    Refill:  3   1. ITP (idiopathic thrombocytopenic purpura)   2. Fibromyalgia   3. Depression  - venlafaxine XR (EFFEXOR XR) 37.5 MG 24 hr capsule;  Take 1 capsule (37.5 mg total) by mouth daily with breakfast.  Dispense: 7 capsule; Refill: 0 - venlafaxine XR (EFFEXOR XR) 75 MG 24 hr capsule; Take 1 capsule (75 mg total) by mouth daily with breakfast.  Dispense: 30 capsule; Refill: 3  4. Chronic sinusitis, unspecified location  - Ambulatory referral to ENT

## 2015-12-23 NOTE — Patient Instructions (Signed)
Gewt Mucinex, 600 mg (OTC), and take 1 twice a day.

## 2016-01-16 NOTE — Progress Notes (Signed)
[facility]  [phone]) [phone] Fax:(336) [phone]  ID: Erica Bartlett OB: [birth year]  MR#: [id number]  MGQ#:[id number]  Patient Care Team: Arlis Porta., MD as PCP - General (Family Medicine) Dereck Leep, MD (Orthopedic Surgery)  CHIEF COMPLAINT: ITP  INTERVAL HISTORY: Patient returns to clinic today for repeat laboratory work and further evaluation. She continues to have right shoulder pain, but otherwise feels well. She any increased bleeding or bruising. She denies any recent fevers or illnesses. She has no neurologic complaints. She denies any chest pain or shortness of breath. She has no nausea, vomiting, constipation, or diarrhea. She has no urinary complaints. Patient offers no further specific complaints today.  REVIEW OF SYSTEMS:   Review of Systems  Constitutional: Negative for fever, malaise/fatigue and weight loss.  Respiratory: Negative for shortness of breath.   Cardiovascular: Negative.  Negative for chest pain.  Gastrointestinal: Negative.  Negative for blood in stool and melena.  Genitourinary: Negative.  Negative for hematuria.  Musculoskeletal: Positive for joint pain.  Neurological: Negative for weakness.  Endo/Heme/Allergies: Does not bruise/bleed easily.  Psychiatric/Behavioral: The patient is nervous/anxious.     As per HPI. Otherwise, a complete review of systems is negative.  PAST MEDICAL HISTORY: Past Medical History:  Diagnosis Date  . Hypertension     PAST SURGICAL HISTORY: Past Surgical History:  Procedure Laterality Date  . ABDOMINAL HYSTERECTOMY    . CESAREAN SECTION    . cyst removed     breast, benign    FAMILY HISTORY: Reviewed and unchanged. No reported history of malignancy or chronic disease.     ADVANCED DIRECTIVES:    HEALTH MAINTENANCE: Social History  Substance Use Topics  . Smoking status: Current Some Day Smoker  . Smokeless tobacco: Never Used  . Alcohol use No    No Known  Allergies  Current Outpatient Prescriptions  Medication Sig Dispense Refill  . acetaminophen (TYLENOL) 500 MG tablet Take 500 mg by mouth every 6 (six) hours as needed.    Marland Kitchen amLODipine (NORVASC) 10 MG tablet TAKE 1 TABLET (10 MG TOTAL) BY MOUTH DAILY. 30 tablet 12  . baclofen (LIORESAL) 10 MG tablet Take 1 tablet (10 mg total) by mouth 3 (three) times daily. 30 each 1  . benzonatate (TESSALON) 100 MG capsule Take 1 capsule (100 mg total) by mouth 3 (three) times daily as needed. 30 capsule 0  . clonazePAM (KLONOPIN) 1 MG tablet Take 1-2 mg by mouth 3 (three) times daily as needed. Reported on 10/11/2015    . clotrimazole-betamethasone (LOTRISONE) cream Apply 1 application topically 2 (two) times daily. 45 g 2  . fluticasone (FLONASE) 50 MCG/ACT nasal spray Place 2 sprays into both nostrils daily. 16 g 6  . gabapentin (NEURONTIN) 600 MG tablet Take 600 mg by mouth 4 (four) times daily.  0  . hydrochlorothiazide (HYDRODIURIL) 25 MG tablet Take 1 tablet (25 mg total) by mouth daily. 30 tablet 12   No current facility-administered medications for this visit.     OBJECTIVE: Vitals:   01/17/16 1029  BP: 136/69  Pulse: 81  Resp: 18  Temp: (!) 95 F (35 C)     Body mass index is 32.14 kg/m.    ECOG FS:0 - Asymptomatic  General: Well-developed, well-nourished, no acute distress. Eyes: Pink conjunctiva, anicteric sclera. Lungs: Clear to auscultation bilaterally. Heart: Regular rate and rhythm. No rubs, murmurs, or gallops. Abdomen: Soft, nontender, nondistended. No organomegaly noted, normoactive bowel sounds. Musculoskeletal: No edema, cyanosis, or clubbing.  Neuro: Alert, answering all questions appropriately. Cranial nerves grossly intact. Skin: No rashes or petechiae noted. Psych: Normal affect.   LAB RESULTS:  Lab Results  Component Value Date   NA 140 08/04/2015   K 3.5 08/04/2015   CL 95 (L) 08/04/2015   CO2 21 08/04/2015   GLUCOSE 91 08/04/2015   BUN 17 08/04/2015    CREATININE 0.98 08/04/2015   CALCIUM 9.3 08/04/2015   PROT 7.7 07/27/2015   ALBUMIN 3.9 07/27/2015   AST 36 07/27/2015   ALT 34 07/27/2015   ALKPHOS 81 07/27/2015   BILITOT 0.3 07/27/2015   GFRNONAA 64 08/04/2015   GFRAA 74 08/04/2015    Lab Results  Component Value Date   WBC 5.5 01/17/2016   NEUTROABS 3.3 01/17/2016   HGB 14.9 01/17/2016   HCT 43.2 01/17/2016   MCV 84.3 01/17/2016   PLT 132 (L) 01/17/2016     STUDIES: No results found.  ASSESSMENT: Bone marrow biopsy proven ITP.  PLAN:    1. ITP: Bone marrow biopsy performed on July 28, 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 Patient's platelet count is stable at 132. No intervention is needed. Return to clinic in 6 weeks for repeat laboratory work and then in 3 months for laboratory work and further evaluation. Patient received cycle 4 of 4 of weekly Rituxan on Aug 30, 2015.  2. Shoulder pain: Continue treatment and evaluation by orthopedics.   Patient expressed understanding and was in agreement with this plan. She also understands that She can call clinic at any time with any questions, concerns, or complaints.   Lloyd Huger, MD   01/17/2016 11:22 AM

## 2016-01-17 ENCOUNTER — Inpatient Hospital Stay: Payer: Commercial Managed Care - HMO

## 2016-01-17 ENCOUNTER — Inpatient Hospital Stay: Payer: Commercial Managed Care - HMO | Attending: Oncology | Admitting: Oncology

## 2016-01-17 VITALS — BP 136/69 | HR 81 | Temp 95.0°F | Resp 18 | Wt 184.3 lb

## 2016-01-17 DIAGNOSIS — Z79899 Other long term (current) drug therapy: Secondary | ICD-10-CM | POA: Diagnosis not present

## 2016-01-17 DIAGNOSIS — I10 Essential (primary) hypertension: Secondary | ICD-10-CM | POA: Diagnosis not present

## 2016-01-17 DIAGNOSIS — M25511 Pain in right shoulder: Secondary | ICD-10-CM | POA: Insufficient documentation

## 2016-01-17 DIAGNOSIS — D693 Immune thrombocytopenic purpura: Secondary | ICD-10-CM | POA: Insufficient documentation

## 2016-01-17 DIAGNOSIS — F17210 Nicotine dependence, cigarettes, uncomplicated: Secondary | ICD-10-CM | POA: Diagnosis not present

## 2016-01-17 DIAGNOSIS — D696 Thrombocytopenia, unspecified: Secondary | ICD-10-CM

## 2016-01-17 LAB — CBC WITH DIFFERENTIAL/PLATELET
Basophils Absolute: 0.1 10*3/uL (ref 0–0.1)
Basophils Relative: 1 %
EOS PCT: 6 %
Eosinophils Absolute: 0.3 10*3/uL (ref 0–0.7)
HCT: 43.2 % (ref 35.0–47.0)
Hemoglobin: 14.9 g/dL (ref 12.0–16.0)
LYMPHS ABS: 1.5 10*3/uL (ref 1.0–3.6)
LYMPHS PCT: 27 %
MCH: 29 pg (ref 26.0–34.0)
MCHC: 34.4 g/dL (ref 32.0–36.0)
MCV: 84.3 fL (ref 80.0–100.0)
MONO ABS: 0.3 10*3/uL (ref 0.2–0.9)
MONOS PCT: 6 %
Neutro Abs: 3.3 10*3/uL (ref 1.4–6.5)
Neutrophils Relative %: 60 %
PLATELETS: 132 10*3/uL — AB (ref 150–440)
RBC: 5.13 MIL/uL (ref 3.80–5.20)
RDW: 14.9 % — AB (ref 11.5–14.5)
WBC: 5.5 10*3/uL (ref 3.6–11.0)

## 2016-01-17 NOTE — Progress Notes (Signed)
Having right arm pain that is being managed by PCP and orthopedic surgeon.

## 2016-01-19 ENCOUNTER — Other Ambulatory Visit: Payer: Self-pay | Admitting: Family Medicine

## 2016-01-19 DIAGNOSIS — J329 Chronic sinusitis, unspecified: Secondary | ICD-10-CM

## 2016-01-20 ENCOUNTER — Ambulatory Visit: Payer: Commercial Managed Care - HMO | Admitting: Podiatry

## 2016-02-17 ENCOUNTER — Ambulatory Visit (INDEPENDENT_AMBULATORY_CARE_PROVIDER_SITE_OTHER): Payer: Commercial Managed Care - HMO | Admitting: Family Medicine

## 2016-02-17 ENCOUNTER — Encounter: Payer: Self-pay | Admitting: Family Medicine

## 2016-02-17 ENCOUNTER — Ambulatory Visit: Payer: Commercial Managed Care - HMO | Admitting: Podiatry

## 2016-02-17 VITALS — BP 138/88 | HR 72 | Temp 98.0°F | Resp 16 | Ht 63.5 in | Wt 184.0 lb

## 2016-02-17 DIAGNOSIS — J320 Chronic maxillary sinusitis: Secondary | ICD-10-CM

## 2016-02-17 DIAGNOSIS — J302 Other seasonal allergic rhinitis: Secondary | ICD-10-CM

## 2016-02-17 DIAGNOSIS — J40 Bronchitis, not specified as acute or chronic: Secondary | ICD-10-CM

## 2016-02-17 MED ORDER — CETIRIZINE HCL 10 MG PO TABS: 10.0000 mg | ORAL_TABLET | Freq: Every day | ORAL | 5 refills | Status: DC

## 2016-02-17 MED ORDER — ALBUTEROL SULFATE HFA 108 (90 BASE) MCG/ACT IN AERS: 2.0000 | INHALATION_SPRAY | RESPIRATORY_TRACT | 1 refills | Status: DC | PRN

## 2016-02-17 MED ORDER — LEVOFLOXACIN 750 MG PO TABS: 750.0000 mg | ORAL_TABLET | Freq: Every day | ORAL | 0 refills | Status: DC

## 2016-02-17 MED ORDER — FLUTICASONE PROPIONATE 50 MCG/ACT NA SUSP: 2.0000 | Freq: Every day | NASAL | 6 refills | Status: DC

## 2016-02-17 NOTE — Assessment & Plan Note (Signed)
Start flonase, zyrtec, likely allergic component contributing to sinusitis

## 2016-02-17 NOTE — Progress Notes (Signed)
Subjective:    Patient ID: Erica Bartlett, female    DOB: 06-Jul-1955, 60 y.o.   MRN: [id number]  Dashaun Garrity Ermis is a 60 y.o. female presenting on 02/17/2016 for Sinusitis (onset 4 days granddaughter was sick cough yellowish thick mucus chills )   HPI  SINUSITIS, Chronic, Follow-up: - Reports recent history with similar sinusitis back in 12/09/15 seen here at [facility], treated with Augmentin course, she did improve but only temporarily, then symptoms returned and worsened over next 1-2 months. 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 - Current symptoms now with persistent worsening bilateral sinus pressure and purulent drainage, describes really thick yellow sputum and nasal discharge, congestion, some headache associated. Recent worsening productive cough - Recent sick contacts at home with granddaughter 5 yr with URI sinus symptoms at home, and now patient's daughter sick similar symptoms - Tried Mucinex, and OTC cold medicines for few days without good relief - Request refill on Flonase and Albuterol inhaler - Admits to financial limitations due to recent hospital and chemotherapy bills, as why she could not follow-up with ENT, but now willing to contact them at least - Denies any fevers/chills, sweats, chest pain, shortness of breath, nausea, vomiting  Social History  Substance Use Topics  . Smoking status: Current Some Day Smoker  . Smokeless tobacco: Current User  . Alcohol use No    Review of Systems Per HPI unless specifically indicated above     Objective:    BP 138/88 (BP Location: Left Arm, Cuff Size: Normal)   Pulse 72   Temp 98 F (36.7 C) (Oral)   Resp 16   Ht 5' 3.5" (1.613 m)   Wt 184 lb (83.5 kg)   SpO2 100%   BMI 32.08 kg/m   Wt Readings from  Last 3 Encounters:  02/17/16 184 lb (83.5 kg)  01/17/16 184 lb 4.9 oz (83.6 kg)  12/23/15 185 lb (83.9 kg)    Physical Exam  Constitutional: She appears well-developed and well-nourished. No distress.  Sick but non toxic appearing, comfortable, cooperative, occasional cough  HENT:  Head: Normocephalic and atraumatic.  Mouth/Throat: Oropharynx is clear and moist.  Frontal / maxillary sinuses tender, with R maxillary worse. Nares with deeper turbinate edema and some congestion without obvious purulence today. Bilateral TMs with minimal effusion, without erythema or bulging. Oropharynx clear without erythema, exudates, edema or asymmetry.  Eyes: Conjunctivae are normal. Right eye exhibits no discharge. Left eye exhibits no discharge.  Neck: Normal range of motion. Neck supple.  Cardiovascular: Normal rate, regular rhythm, normal heart sounds and intact distal pulses.   No murmur heard. Pulmonary/Chest: Effort normal and breath sounds normal. No respiratory distress. She has no wheezes. She has no rales.  Mild transmitted upper airway sounds clear with cough  Lymphadenopathy:    She has no cervical adenopathy.  Neurological: She is alert.  Skin: Skin is warm and dry. She is not diaphoretic.  Nursing note and vitals reviewed.    I have personally reviewed the following lab results from 01/17/16.  Results for orders placed or performed in visit on 01/17/16  CBC with Differential/Platelet  Result Value Ref Range   WBC 5.5 3.6 - 11.0 K/uL   RBC 5.13 3.80 - 5.20 MIL/uL   Hemoglobin 14.9  12.0 - 16.0 g/dL   HCT 43.2 35.0 - 47.0 %   MCV 84.3 80.0 - 100.0 fL   MCH 29.0 26.0 - 34.0 pg   MCHC 34.4 32.0 - 36.0 g/dL   RDW 14.9 (H) 11.5 - 14.5 %   Platelets 132 (L) 150 - 440 K/uL   Neutrophils Relative % 60 %   Neutro Abs 3.3 1.4 - 6.5 K/uL   Lymphocytes Relative 27 %   Lymphs Abs 1.5 1.0 - 3.6 K/uL   Monocytes Relative 6 %   Monocytes Absolute 0.3 0.2 - 0.9 K/uL   Eosinophils Relative 6 %     Eosinophils Absolute 0.3 0 - 0.7 K/uL   Basophils Relative 1 %   Basophils Absolute 0.1 0 - 0.1 K/uL      Assessment & Plan:   Problem List Items Addressed This Visit    Chronic maxillary sinusitis - Primary    Recurrent worsening chronic bilateral sinusitis (worse R maxillary), initial 11/2015, temporary relief on augmentin, then worsening. Multiple sick contacts. Possible allergic component worsening as well. Afebrile today.  Plan: 1. Start Levaquin 750mg  daily x 5 days 2. Start Flonase 2 sprays BID x 1 month, may continue if improved then PRN 3. Start Zyrtec 10mg  OTC trial x 1 month, may continue if improved 4. Supportive care with nasal saline OTC, hydration 5. Discussed her prior referral to ENT, now able to schedule apt at [facility]NT (Dr Ernesto Rutherford [phone]), however patient limited financially and unable to go at this time, she understands that if repeat antibiotics not improving it, and persistent >3 months, there are limited options for [place] here, and she may need to see ENT, she will at least call them and ask about payment plan or discuss options to keep this as a viable future alternative if needed 6. Return criteria reviewed      Relevant Medications   levofloxacin (LEVAQUIN) 750 MG tablet   fluticasone (FLONASE) 50 MCG/ACT nasal spray   cetirizine (ZYRTEC) 10 MG tablet   Bronchitis    Refilled Albuterol      Relevant Medications   albuterol (PROVENTIL HFA;VENTOLIN HFA) 108 (90 Base) MCG/ACT inhaler   Allergic rhinitis    Start flonase, zyrtec, likely allergic component contributing to sinusitis      Relevant Medications   fluticasone (FLONASE) 50 MCG/ACT nasal spray   cetirizine (ZYRTEC) 10 MG tablet    Other Visit Diagnoses   None.     Meds ordered this encounter  Medications  . levofloxacin (LEVAQUIN) 750 MG tablet    Sig: Take 1 tablet (750 mg total) by mouth daily. For 5 days    Dispense:  5 tablet    Refill:  0  . fluticasone (FLONASE) 50 MCG/ACT  nasal spray    Sig: Place 2 sprays into both nostrils daily.    Dispense:  16 g    Refill:  6  . albuterol (PROVENTIL HFA;VENTOLIN HFA) 108 (90 Base) MCG/ACT inhaler    Sig: Inhale 2 puffs into the lungs every 4 (four) hours as needed for wheezing or shortness of breath (cough).    Dispense:  1 Inhaler    Refill:  1  . cetirizine (ZYRTEC) 10 MG tablet    Sig: Take 1 tablet (10 mg total) by mouth daily.    Dispense:  30 tablet    Refill:  5      Follow up plan: Return in about 2 weeks (around 03/02/2016), or if symptoms worsen or fail to improve,  for sinusitis.  [name], [facility] 02/17/2016, 9:04 AM

## 2016-02-17 NOTE — Patient Instructions (Signed)
Thank you for coming in to clinic today.  1. It sounds like you have a chronic Sinusitis (Bacterial Infection) - this most likely started as an Upper Respiratory Virus that has settled into an infection. Allergies can also cause this. This is a serious problem since it has been lasting for >2 months now, it may be difficult to cure. - Start Levaquin 750mg  1 pill daily with food for 5 days, it will stay in your system for up to 10 days - Start taking Flonase nasal spray 2 spray in each nostril each day for next 4 weeks, may need for longer - Recommend to start using Nasal Saline spray multiple times a day to help flush out congestion and clear sinuses - Improve hydration by drinking plenty of clear fluids (water, gatorade) to reduce secretions and thin congestion - If you can, start Zyrtec (once daily) over the counter for about 1 month trial - if helping can continue or resume during allergy season - Congestion draining down throat can cause irritation. May try warm herbal tea with honey, cough drops - Can take Tylenol or Ibuprofen as needed for fevers - Use Mucinex for up to 1 more week then [facility]  Call [facility] ENT - Dr Ernesto Rutherford office [phone] to notify them of your decision to schedule an appointment reschedule, or hold off for the future. Please ask them about any form of payment plan as well. This is especially important several weeks down the road if you do not fully improve on the current antibiotic, there are no other options we have for you here.  If you develop persistent fever >101F for at least 3 consecutive days, headaches with sinus pain or pressure or persistent earache, please schedule a follow-up evaluation within next few days to week.  Please schedule a follow-up appointment with Dr. Luan Pulling in 2 weeks if not improved sinusitis  If you have any other questions or concerns, please feel free to call the clinic or send a message through [place]. You may also schedule an  earlier appointment if necessary.  [name], DO [place]

## 2016-02-17 NOTE — Assessment & Plan Note (Signed)
Recurrent worsening chronic bilateral sinusitis (worse R maxillary), initial 11/2015, temporary relief on augmentin, then worsening. Multiple sick contacts. Possible allergic component worsening as well. Afebrile today.  Plan: 1. Start Levaquin 750mg  daily x 5 days 2. Start Flonase 2 sprays BID x 1 month, may continue if improved then PRN 3. Start Zyrtec 10mg  OTC trial x 1 month, may continue if improved 4. Supportive care with nasal saline OTC, hydration 5. Discussed her prior referral to ENT, now able to schedule apt at [facility]NT (Dr Ernesto Rutherford [phone]), however patient limited financially and unable to go at this time, she understands that if repeat antibiotics not improving it, and persistent >3 months, there are limited options for [place] here, and she may need to see ENT, she will at least call them and ask about payment plan or discuss options to keep this as a viable future alternative if needed 6. Return criteria reviewed

## 2016-02-17 NOTE — Assessment & Plan Note (Signed)
Refilled Albuterol

## 2016-02-28 ENCOUNTER — Inpatient Hospital Stay: Payer: Commercial Managed Care - HMO

## 2016-04-12 ENCOUNTER — Other Ambulatory Visit: Payer: Self-pay

## 2016-04-12 ENCOUNTER — Inpatient Hospital Stay: Payer: Commercial Managed Care - HMO

## 2016-04-12 ENCOUNTER — Inpatient Hospital Stay: Payer: Commercial Managed Care - HMO | Attending: Oncology

## 2016-04-12 DIAGNOSIS — I10 Essential (primary) hypertension: Secondary | ICD-10-CM | POA: Diagnosis not present

## 2016-04-12 DIAGNOSIS — F17210 Nicotine dependence, cigarettes, uncomplicated: Secondary | ICD-10-CM | POA: Insufficient documentation

## 2016-04-12 DIAGNOSIS — Z79899 Other long term (current) drug therapy: Secondary | ICD-10-CM | POA: Insufficient documentation

## 2016-04-12 DIAGNOSIS — D696 Thrombocytopenia, unspecified: Secondary | ICD-10-CM

## 2016-04-12 DIAGNOSIS — D693 Immune thrombocytopenic purpura: Secondary | ICD-10-CM | POA: Insufficient documentation

## 2016-04-12 LAB — CBC WITH DIFFERENTIAL/PLATELET
BASOS PCT: 1 %
Basophils Absolute: 0.1 10*3/uL (ref 0–0.1)
Eosinophils Absolute: 0.2 10*3/uL (ref 0–0.7)
Eosinophils Relative: 4 %
HEMATOCRIT: 41.9 % (ref 35.0–47.0)
HEMOGLOBIN: 14.4 g/dL (ref 12.0–16.0)
LYMPHS ABS: 1.2 10*3/uL (ref 1.0–3.6)
LYMPHS PCT: 22 %
MCH: 28.6 pg (ref 26.0–34.0)
MCHC: 34.5 g/dL (ref 32.0–36.0)
MCV: 82.8 fL (ref 80.0–100.0)
MONO ABS: 0.4 10*3/uL (ref 0.2–0.9)
MONOS PCT: 7 %
NEUTROS ABS: 3.6 10*3/uL (ref 1.4–6.5)
NEUTROS PCT: 66 %
Platelets: 138 10*3/uL — ABNORMAL LOW (ref 150–440)
RBC: 5.05 MIL/uL (ref 3.80–5.20)
RDW: 14.5 % (ref 11.5–14.5)
WBC: 5.5 10*3/uL (ref 3.6–11.0)

## 2016-04-19 ENCOUNTER — Other Ambulatory Visit: Payer: Commercial Managed Care - HMO

## 2016-04-19 ENCOUNTER — Ambulatory Visit: Payer: Commercial Managed Care - HMO | Admitting: Oncology

## 2016-04-24 NOTE — Progress Notes (Signed)
Erica Bartlett  [phone] [phone]  ID: Erica Bartlett OB: 12-28-1955  MR#: [id number]  MBT#:[id number]  Patient Care Team: Arlis Porta., MD as PCP - General (Family Medicine) Dereck Leep, MD (Orthopedic Surgery)  CHIEF COMPLAINT: ITP  INTERVAL HISTORY: Patient returns to clinic today for repeat laboratory work and further evaluation. She continues to have right shoulder pain and limited mobility, but otherwise feels well. She denies any increased bleeding or bruising. She denies any recent fevers or illnesses. She has no neurologic complaints. She denies any chest pain or shortness of breath. She has no nausea, vomiting, constipation, or diarrhea. She has no urinary complaints. Patient offers no further specific complaints today.  REVIEW OF SYSTEMS:   Review of Systems  Constitutional: Negative for fever, malaise/fatigue and weight loss.  Respiratory: Negative for shortness of breath.   Cardiovascular: Negative.  Negative for chest pain.  Gastrointestinal: Negative.  Negative for blood in stool and melena.  Genitourinary: Negative.  Negative for hematuria.  Musculoskeletal: Positive for joint pain.  Neurological: Negative for weakness.  Endo/Heme/Allergies: Does not bruise/bleed easily.  Psychiatric/Behavioral: The patient is nervous/anxious.     As per HPI. Otherwise, a complete review of systems is negative.  PAST MEDICAL HISTORY: Past Medical History:  Diagnosis Date  . Hypertension     PAST SURGICAL HISTORY: Past Surgical History:  Procedure Laterality Date  . ABDOMINAL HYSTERECTOMY    . CESAREAN SECTION    . cyst removed     breast, benign    FAMILY HISTORY: Reviewed and unchanged. No reported history of malignancy or chronic disease.     ADVANCED DIRECTIVES:    HEALTH MAINTENANCE: Social History  Substance Use Topics  . Smoking status: Current Some Day Smoker  . Smokeless tobacco: Current User  .  Alcohol use No    No Known Allergies  Current Outpatient Prescriptions  Medication Sig Dispense Refill  . acetaminophen (TYLENOL) 500 MG tablet Take 500 mg by mouth every 6 (six) hours as needed.    Marland Kitchen albuterol (PROVENTIL HFA;VENTOLIN HFA) 108 (90 Base) MCG/ACT inhaler Inhale 2 puffs into the lungs every 4 (four) hours as needed for wheezing or shortness of breath (cough). 1 Inhaler 1  . amLODipine (NORVASC) 10 MG tablet TAKE 1 TABLET (10 MG TOTAL) BY MOUTH DAILY. 30 tablet 12  . cetirizine (ZYRTEC) 10 MG tablet Take 1 tablet (10 mg total) by mouth daily. 30 tablet 5  . clonazePAM (KLONOPIN) 1 MG tablet Take 1-2 mg by mouth 3 (three) times daily as needed. Reported on 10/11/2015    . fluticasone (FLONASE) 50 MCG/ACT nasal spray Place 2 sprays into both nostrils daily. 16 g 6  . hydrochlorothiazide (HYDRODIURIL) 25 MG tablet Take 1 tablet (25 mg total) by mouth daily. 30 tablet 12   No current facility-administered medications for this visit.     OBJECTIVE: Vitals:   04/26/16 0950  BP: 130/86  Pulse: 71  Resp: 18  Temp: 97 F (36.1 C)     Body mass index is 30.64 kg/m.    ECOG FS:0 - Asymptomatic  General: Well-developed, well-nourished, no acute distress. Eyes: Pink conjunctiva, anicteric sclera. Lungs: Clear to auscultation bilaterally. Heart: Regular rate and rhythm. No rubs, murmurs, or gallops. Abdomen: Soft, nontender, nondistended. No organomegaly noted, normoactive bowel sounds. Musculoskeletal: No edema, cyanosis, or clubbing. Neuro: Alert, answering all questions appropriately. Cranial nerves grossly intact. Skin: No rashes or petechiae noted. Psych: Normal affect.   LAB RESULTS:  Lab  Results  Component Value Date   NA 140 08/04/2015   K 3.5 08/04/2015   CL 95 (L) 08/04/2015   CO2 21 08/04/2015   GLUCOSE 91 08/04/2015   BUN 17 08/04/2015   CREATININE 0.98 08/04/2015   CALCIUM 9.3 08/04/2015   PROT 7.7 07/27/2015   ALBUMIN 3.9 07/27/2015   AST 36  07/27/2015   ALT 34 07/27/2015   ALKPHOS 81 07/27/2015   BILITOT 0.3 07/27/2015   GFRNONAA 64 08/04/2015   GFRAA 74 08/04/2015    Lab Results  Component Value Date   WBC 5.1 04/26/2016   NEUTROABS 3.0 04/26/2016   HGB 14.7 04/26/2016   HCT 43.1 04/26/2016   MCV 83.9 04/26/2016   PLT 143 (L) 04/26/2016     STUDIES: No results found.  ASSESSMENT: Bone marrow biopsy proven ITP.  PLAN:    1. ITP: Bone marrow biopsy performed on July 28, 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 Patient responded to steroids, but the results were not durable. She received cycle 4 of 4 of weekly Rituxan on Aug 30, 2015.  Patient's platelet count is stable at 143. No intervention is needed at this time. Return to clinic in 3 months for laboratory work and and then in 6 months for further evaluation.  2. Shoulder pain: Continue treatment and evaluation by orthopedics.   Patient expressed understanding and was in agreement with this plan. She also understands that She can call clinic at any time with any questions, concerns, or complaints.   Lloyd Huger, MD   04/26/2016 4:26 PM

## 2016-04-26 ENCOUNTER — Other Ambulatory Visit: Payer: Self-pay

## 2016-04-26 ENCOUNTER — Inpatient Hospital Stay: Payer: Medicare HMO | Attending: Oncology | Admitting: Oncology

## 2016-04-26 ENCOUNTER — Inpatient Hospital Stay: Payer: Medicare HMO

## 2016-04-26 VITALS — BP 130/86 | HR 71 | Temp 97.0°F | Resp 18 | Wt 175.7 lb

## 2016-04-26 DIAGNOSIS — M25511 Pain in right shoulder: Secondary | ICD-10-CM | POA: Diagnosis not present

## 2016-04-26 DIAGNOSIS — F17210 Nicotine dependence, cigarettes, uncomplicated: Secondary | ICD-10-CM | POA: Diagnosis not present

## 2016-04-26 DIAGNOSIS — D696 Thrombocytopenia, unspecified: Secondary | ICD-10-CM

## 2016-04-26 DIAGNOSIS — Z79899 Other long term (current) drug therapy: Secondary | ICD-10-CM | POA: Insufficient documentation

## 2016-04-26 DIAGNOSIS — I10 Essential (primary) hypertension: Secondary | ICD-10-CM | POA: Insufficient documentation

## 2016-04-26 DIAGNOSIS — D693 Immune thrombocytopenic purpura: Secondary | ICD-10-CM

## 2016-04-26 LAB — CBC WITH DIFFERENTIAL/PLATELET
Basophils Absolute: 0.1 10*3/uL (ref 0–0.1)
Basophils Relative: 1 %
EOS ABS: 0.4 10*3/uL (ref 0–0.7)
EOS PCT: 8 %
HCT: 43.1 % (ref 35.0–47.0)
Hemoglobin: 14.7 g/dL (ref 12.0–16.0)
Lymphocytes Relative: 25 %
Lymphs Abs: 1.3 10*3/uL (ref 1.0–3.6)
MCH: 28.6 pg (ref 26.0–34.0)
MCHC: 34.1 g/dL (ref 32.0–36.0)
MCV: 83.9 fL (ref 80.0–100.0)
MONO ABS: 0.3 10*3/uL (ref 0.2–0.9)
Monocytes Relative: 6 %
Neutro Abs: 3 10*3/uL (ref 1.4–6.5)
Neutrophils Relative %: 60 %
PLATELETS: 143 10*3/uL — AB (ref 150–440)
RBC: 5.14 MIL/uL (ref 3.80–5.20)
RDW: 15.2 % — AB (ref 11.5–14.5)
WBC: 5.1 10*3/uL (ref 3.6–11.0)

## 2016-04-26 NOTE — Progress Notes (Signed)
Complains of right arm pain due to tear in rotator cuff.

## 2016-05-26 ENCOUNTER — Ambulatory Visit (INDEPENDENT_AMBULATORY_CARE_PROVIDER_SITE_OTHER): Payer: Commercial Managed Care - HMO | Admitting: Family Medicine

## 2016-05-26 ENCOUNTER — Encounter: Payer: Self-pay | Admitting: *Deleted

## 2016-05-26 ENCOUNTER — Encounter: Payer: Self-pay | Admitting: Family Medicine

## 2016-05-26 VITALS — BP 129/88 | HR 70 | Temp 98.4°F | Resp 16 | Ht 63.5 in | Wt 178.0 lb

## 2016-05-26 DIAGNOSIS — R6883 Chills (without fever): Secondary | ICD-10-CM

## 2016-05-26 DIAGNOSIS — R69 Illness, unspecified: Secondary | ICD-10-CM

## 2016-05-26 DIAGNOSIS — R6889 Other general symptoms and signs: Secondary | ICD-10-CM | POA: Diagnosis not present

## 2016-05-26 DIAGNOSIS — J111 Influenza due to unidentified influenza virus with other respiratory manifestations: Secondary | ICD-10-CM

## 2016-05-26 LAB — POCT INFLUENZA A/B
INFLUENZA A, POC: NEGATIVE
INFLUENZA B, POC: NEGATIVE

## 2016-05-26 MED ORDER — OSELTAMIVIR PHOSPHATE 75 MG PO CAPS: 75.0000 mg | ORAL_CAPSULE | Freq: Two times a day (BID) | ORAL | 0 refills | Status: DC

## 2016-05-26 MED ORDER — OSELTAMIVIR PHOSPHATE 75 MG PO CAPS: 75.0000 mg | ORAL_CAPSULE | Freq: Once | ORAL | Status: DC

## 2016-05-26 NOTE — Addendum Note (Signed)
Addended by: Devona Konig on: 05/26/2016 09:49 AM   Modules accepted: Orders

## 2016-05-26 NOTE — Progress Notes (Signed)
Name: Erica Bartlett   MRN: [id number]    DOB: July 14, 1955   Date:05/26/2016       Progress Note  Subjective  Chief Complaint  Chief Complaint  Patient presents with  . Influenza    HPI Here c/o 4 days of feeling bad.  Started as HA with achiness, and chills and sweats.  No documented fever.  Then developed cough and congestion.  She still has HA ands chills and weakness.  Cough is productive of yelllow sputum.  She has not had a flu shot. No problem-specific Assessment & Plan notes found for this encounter.   Past Medical History:  Diagnosis Date  . Hypertension     Social History  Substance Use Topics  . Smoking status: Current Some Day Smoker  . Smokeless tobacco: Current User  . Alcohol use No     Current Outpatient Prescriptions:  .  acetaminophen (TYLENOL) 500 MG tablet, Take 500 mg by mouth every 6 (six) hours as needed., Disp: , Rfl:  .  albuterol (PROVENTIL HFA;VENTOLIN HFA) 108 (90 Base) MCG/ACT inhaler, Inhale 2 puffs into the lungs every 4 (four) hours as needed for wheezing or shortness of breath (cough)., Disp: 1 Inhaler, Rfl: 1 .  amLODipine (NORVASC) 10 MG tablet, TAKE 1 TABLET (10 MG TOTAL) BY MOUTH DAILY., Disp: 30 tablet, Rfl: 12 .  cetirizine (ZYRTEC) 10 MG tablet, Take 1 tablet (10 mg total) by mouth daily., Disp: 30 tablet, Rfl: 5 .  clonazePAM (KLONOPIN) 1 MG tablet, Take 1-2 mg by mouth 3 (three) times daily as needed. Reported on 10/11/2015, Disp: , Rfl:  .  fluticasone (FLONASE) 50 MCG/ACT nasal spray, Place 2 sprays into both nostrils daily., Disp: 16 g, Rfl: 6 .  hydrochlorothiazide (HYDRODIURIL) 25 MG tablet, Take 1 tablet (25 mg total) by mouth daily., Disp: 30 tablet, Rfl: 12  Not on File  Review of Systems  Constitutional: Positive for chills and malaise/fatigue. Negative for fever and weight loss.  HENT: Positive for congestion. Negative for hearing loss, sore throat and tinnitus.   Eyes: Negative for blurred vision and double vision.   Respiratory: Positive for cough and sputum production. Negative for shortness of breath and wheezing.   Cardiovascular: Negative for chest pain, palpitations and leg swelling.  Gastrointestinal: Negative for abdominal pain, blood in stool and heartburn.  Genitourinary: Negative for dysuria, frequency and urgency.  Musculoskeletal: Negative for joint pain and myalgias.  Skin: Negative for rash.  Neurological: Positive for weakness. Negative for dizziness, tingling, tremors and headaches.      Objective  Vitals:   05/26/16 0842  BP: 129/88  Pulse: 70  Resp: 16  Temp: 98.4 F (36.9 C)  TempSrc: Oral  Weight: 178 lb (80.7 kg)  Height: 5' 3.5" (1.613 m)     Physical Exam  Constitutional: She is oriented to person, place, and time. She appears distressed (Appears to feel ill).  HENT:  Head: Normocephalic and atraumatic.  Eyes: No scleral icterus.  Neck: Normal range of motion. Neck supple.  Cardiovascular: Normal rate, regular rhythm and normal heart sounds.   Pulmonary/Chest: Effort normal and breath sounds normal. No respiratory distress. She has no wheezes. She has no rales.  Musculoskeletal: She exhibits no edema.  Lymphadenopathy:    She has no cervical adenopathy.  Neurological: She is alert and oriented to person, place, and time.  Vitals reviewed.     Recent Results (from the past 2160 hour(s))  CBC with Differential/Platelet     Status: Abnormal  Collection Time: 04/12/16  9:43 AM  Result Value Ref Range   WBC 5.5 3.6 - 11.0 K/uL   RBC 5.05 3.80 - 5.20 MIL/uL   Hemoglobin 14.4 12.0 - 16.0 g/dL   HCT 41.9 35.0 - 47.0 %   MCV 82.8 80.0 - 100.0 fL   MCH 28.6 26.0 - 34.0 pg   MCHC 34.5 32.0 - 36.0 g/dL   RDW 14.5 11.5 - 14.5 %   Platelets 138 (L) 150 - 440 K/uL   Neutrophils Relative % 66 %   Neutro Abs 3.6 1.4 - 6.5 K/uL   Lymphocytes Relative 22 %   Lymphs Abs 1.2 1.0 - 3.6 K/uL   Monocytes Relative 7 %   Monocytes Absolute 0.4 0.2 - 0.9 K/uL    Eosinophils Relative 4 %   Eosinophils Absolute 0.2 0 - 0.7 K/uL   Basophils Relative 1 %   Basophils Absolute 0.1 0 - 0.1 K/uL  CBC with Differential/Platelet     Status: Abnormal   Collection Time: 04/26/16  9:30 AM  Result Value Ref Range   WBC 5.1 3.6 - 11.0 K/uL   RBC 5.14 3.80 - 5.20 MIL/uL   Hemoglobin 14.7 12.0 - 16.0 g/dL   HCT 43.1 35.0 - 47.0 %   MCV 83.9 80.0 - 100.0 fL   MCH 28.6 26.0 - 34.0 pg   MCHC 34.1 32.0 - 36.0 g/dL   RDW 15.2 (H) 11.5 - 14.5 %   Platelets 143 (L) 150 - 440 K/uL   Neutrophils Relative % 60 %   Neutro Abs 3.0 1.4 - 6.5 K/uL   Lymphocytes Relative 25 %   Lymphs Abs 1.3 1.0 - 3.6 K/uL   Monocytes Relative 6 %   Monocytes Absolute 0.3 0.2 - 0.9 K/uL   Eosinophils Relative 8 %   Eosinophils Absolute 0.4 0 - 0.7 K/uL   Basophils Relative 1 %   Basophils Absolute 0.1 0 - 0.1 K/uL  POCT Influenza A/B     Status: Abnormal   Collection Time: 05/26/16  9:02 AM  Result Value Ref Range   Influenza A, POC Negative Negative   Influenza B, POC Negative Negative     Assessment & Plan  1. Chills  - POCT Influenza A/B-Neg  2. Flu-like symptoms  - POCT Influenza A/B-Neg  3. Influenza-like illness  - oseltamivir (TAMIFLU) capsule 75 mg; Take 1 capsule (75 mg total) by mouth once. Cont OTC ibuprofen, cold and cough meds /Stay well hydrated.

## 2016-05-29 IMAGING — CR DG SHOULDER 2+V*R*
1 series · 3 of 3 positions shown · non-contrast
Comparison: None.

CLINICAL DATA: Right neck shoulder pain for 1 week. No known
injury.

EXAM:
RIGHT SHOULDER - 2+ VIEW

[Series 1: dg shoulder right · 0.14mm/px · 3 of 3 slices shown]
[im 1/3]
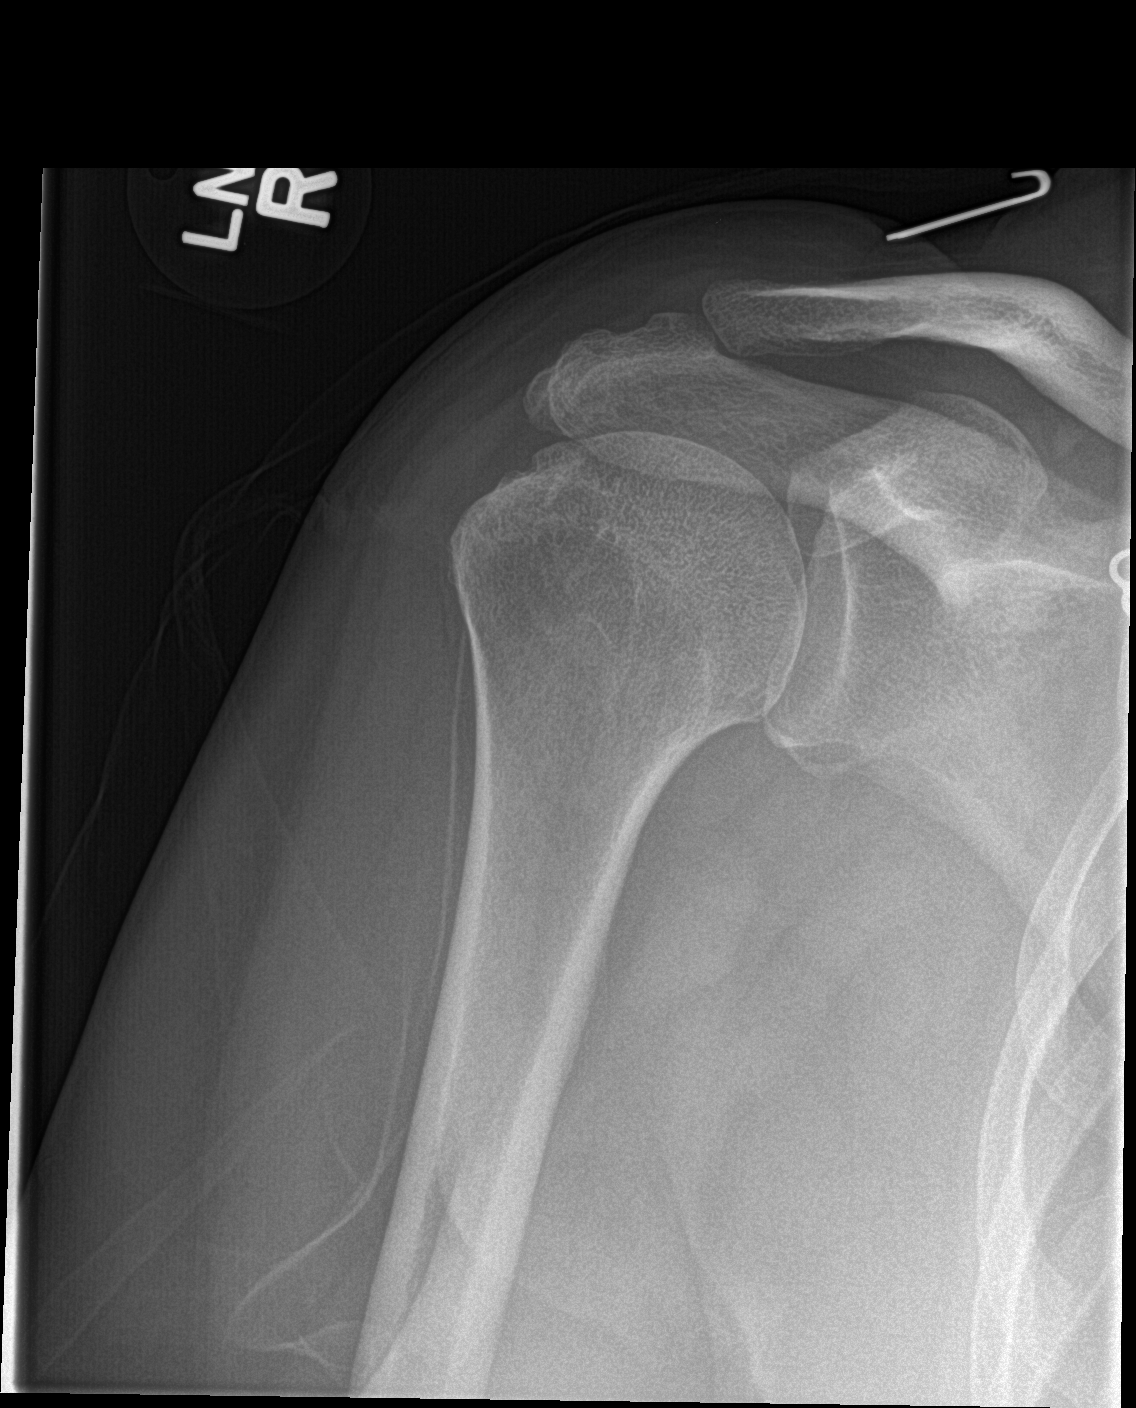
[im 2/3]
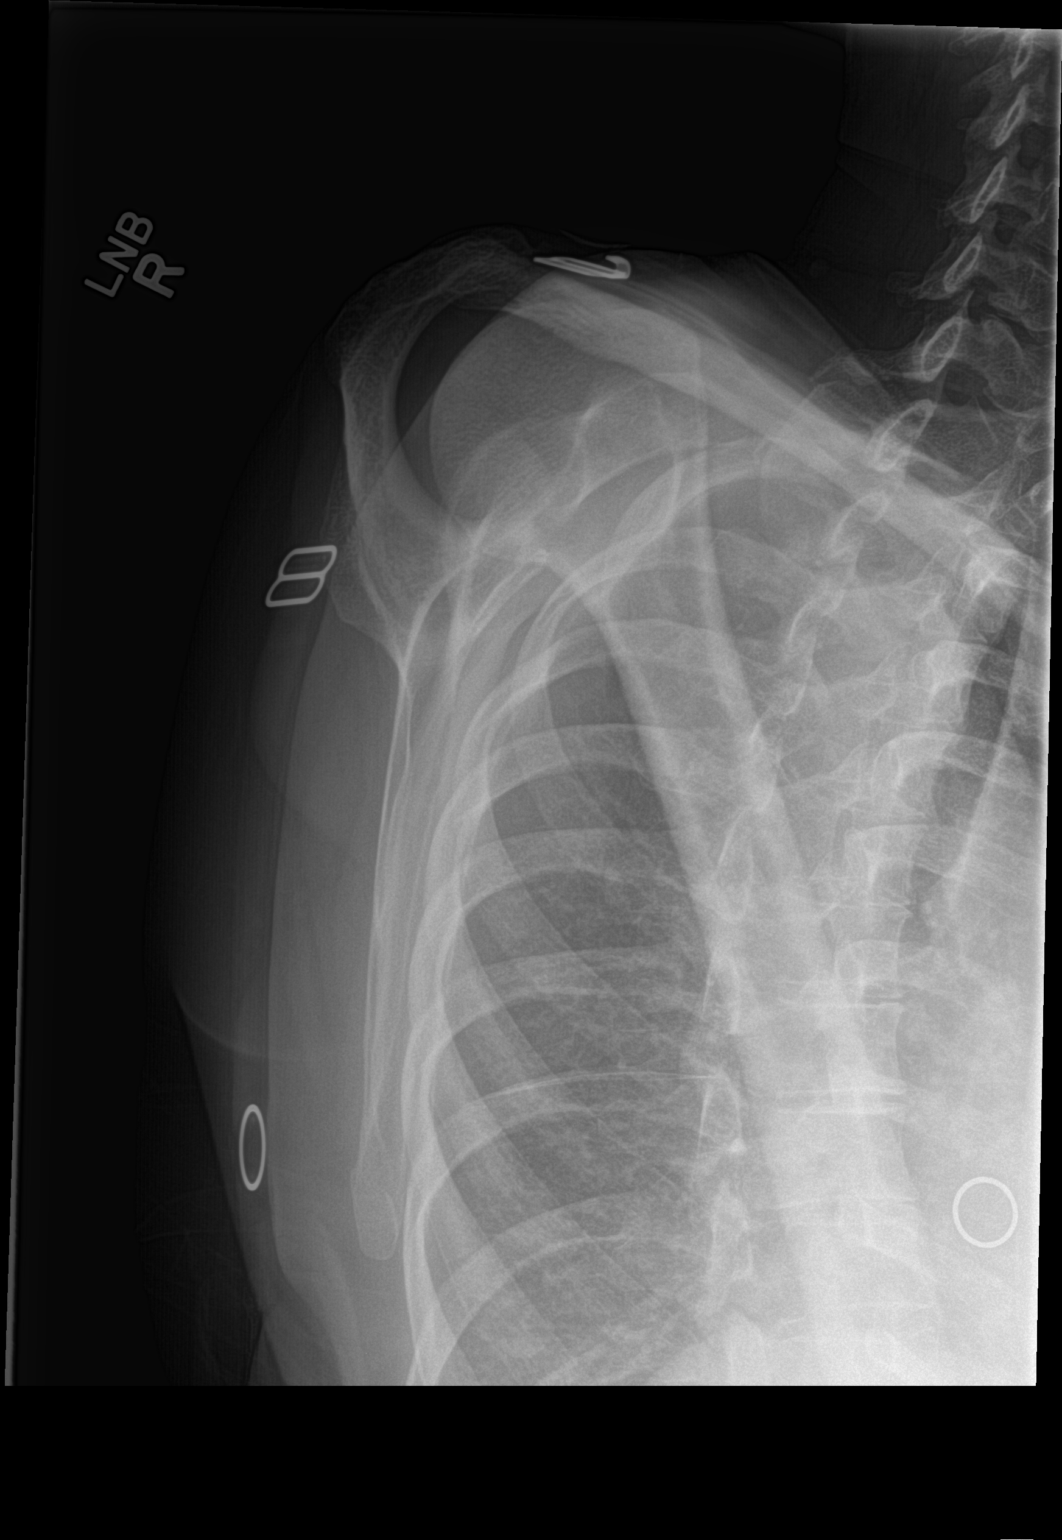
[im 3/3]
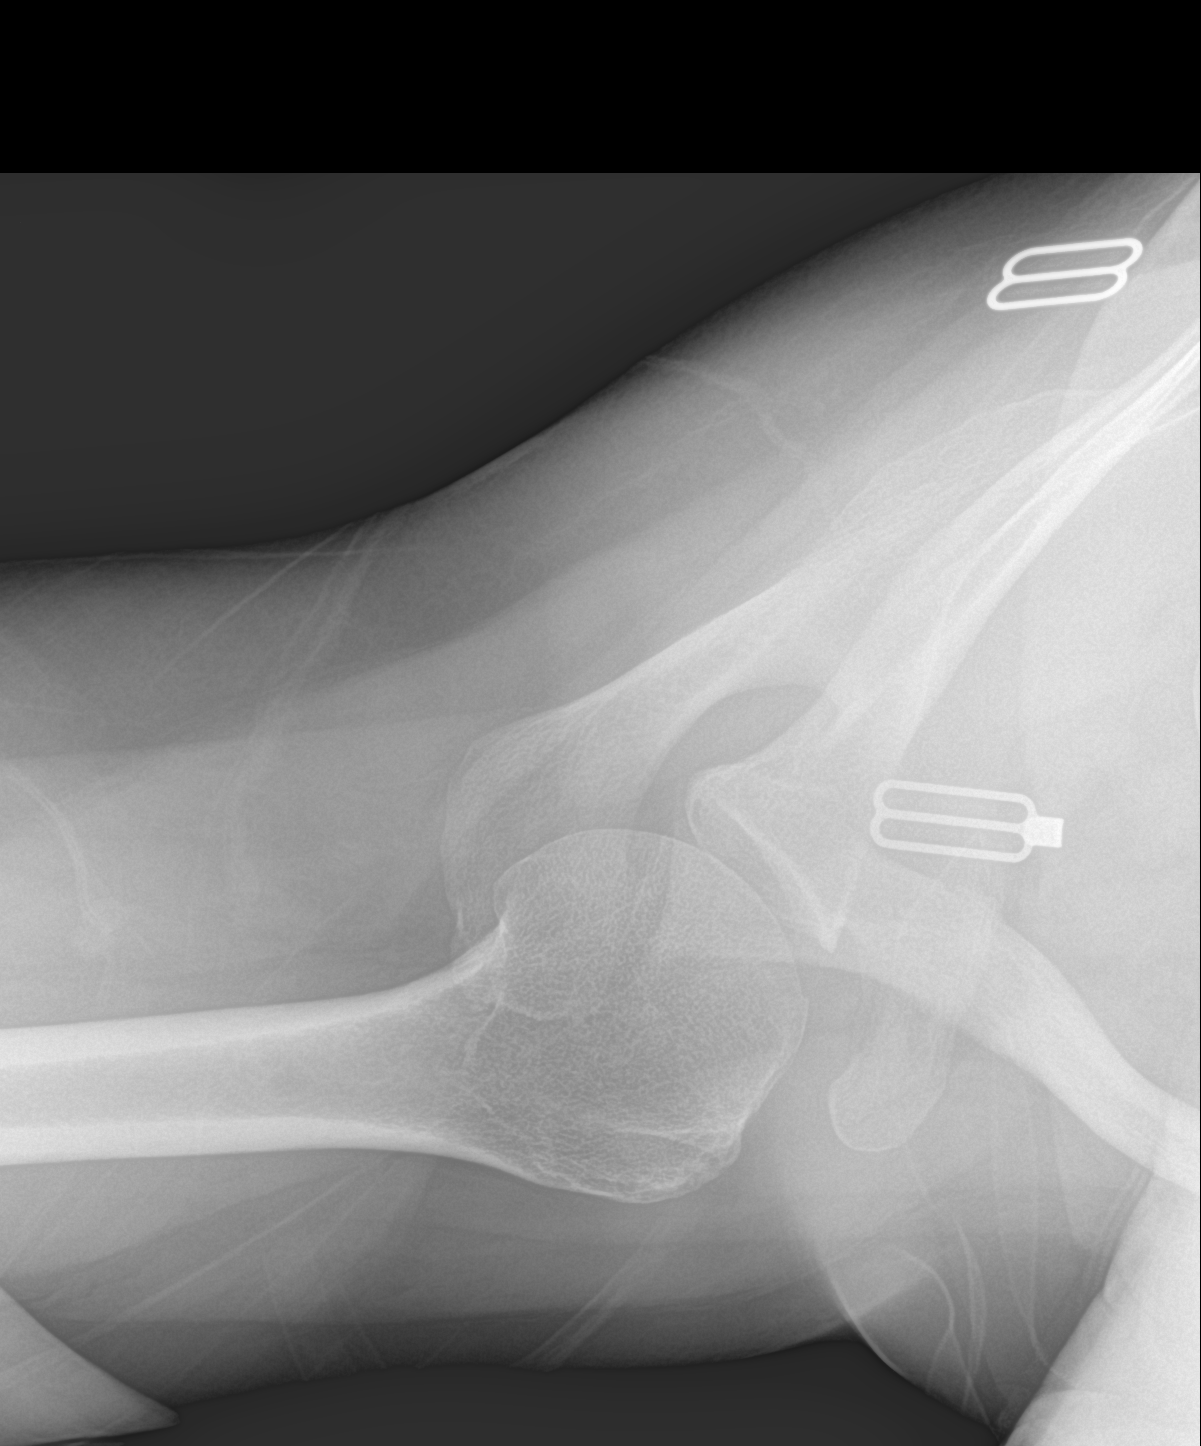

[3 of 3 positions shown; findings below may reference images not displayed]

FINDINGS: The mineralization and alignment are normal. There is no evidence of
acute fracture or dislocation. The subacromial space is adequately
main. There are mild acromioclavicular degenerative changes. There
is mild spurring of the greater tuberosity.
IMPRESSION: No acute osseous findings.

## 2016-07-25 ENCOUNTER — Inpatient Hospital Stay: Payer: Medicare HMO | Attending: Oncology

## 2016-07-25 DIAGNOSIS — D696 Thrombocytopenia, unspecified: Secondary | ICD-10-CM

## 2016-07-25 DIAGNOSIS — D693 Immune thrombocytopenic purpura: Secondary | ICD-10-CM | POA: Diagnosis not present

## 2016-07-25 LAB — CBC WITH DIFFERENTIAL/PLATELET
BASOS PCT: 1 %
Basophils Absolute: 0 10*3/uL (ref 0–0.1)
EOS ABS: 0.2 10*3/uL (ref 0–0.7)
Eosinophils Relative: 4 %
HCT: 40.6 % (ref 35.0–47.0)
HEMOGLOBIN: 14.1 g/dL (ref 12.0–16.0)
LYMPHS ABS: 1.2 10*3/uL (ref 1.0–3.6)
Lymphocytes Relative: 20 %
MCH: 29.1 pg (ref 26.0–34.0)
MCHC: 34.8 g/dL (ref 32.0–36.0)
MCV: 83.6 fL (ref 80.0–100.0)
MONO ABS: 0.4 10*3/uL (ref 0.2–0.9)
MONOS PCT: 6 %
NEUTROS PCT: 69 %
Neutro Abs: 4.1 10*3/uL (ref 1.4–6.5)
Platelets: 112 10*3/uL — ABNORMAL LOW (ref 150–440)
RBC: 4.86 MIL/uL (ref 3.80–5.20)
RDW: 15.2 % — AB (ref 11.5–14.5)
WBC: 5.8 10*3/uL (ref 3.6–11.0)

## 2016-08-14 ENCOUNTER — Other Ambulatory Visit: Payer: Self-pay

## 2016-08-14 DIAGNOSIS — I10 Essential (primary) hypertension: Secondary | ICD-10-CM

## 2016-08-14 MED ORDER — HYDROCHLOROTHIAZIDE 25 MG PO TABS: 25.0000 mg | ORAL_TABLET | Freq: Every day | ORAL | 0 refills | Status: DC

## 2016-08-14 NOTE — Telephone Encounter (Signed)
Appointment 08/17/16

## 2016-08-14 NOTE — Telephone Encounter (Signed)
Last ov 05/26/16 Last filled 08/12/15 Please review. Thank you. sd

## 2016-08-14 NOTE — Telephone Encounter (Signed)
Patient will need a visit for hypertension review before additional refills.  Recent visits have been for acute needs. One refill provided.  Please call pt. to schedule an appointment with me or Dr. Raliegh Ip since Dr. Luan Pulling is still listed as her PCP.

## 2016-08-17 ENCOUNTER — Ambulatory Visit (INDEPENDENT_AMBULATORY_CARE_PROVIDER_SITE_OTHER): Payer: Medicare HMO | Admitting: Nurse Practitioner

## 2016-08-17 ENCOUNTER — Encounter: Payer: Self-pay | Admitting: Nurse Practitioner

## 2016-08-17 VITALS — BP 105/69 | HR 78 | Temp 98.5°F | Resp 16 | Ht 63.5 in | Wt 181.6 lb

## 2016-08-17 DIAGNOSIS — Z1231 Encounter for screening mammogram for malignant neoplasm of breast: Secondary | ICD-10-CM

## 2016-08-17 DIAGNOSIS — H6191 Disorder of right external ear, unspecified: Secondary | ICD-10-CM | POA: Diagnosis not present

## 2016-08-17 DIAGNOSIS — I10 Essential (primary) hypertension: Secondary | ICD-10-CM | POA: Diagnosis not present

## 2016-08-17 LAB — COMPREHENSIVE METABOLIC PANEL
ALT: 30 U/L — ABNORMAL HIGH (ref 6–29)
AST: 25 U/L (ref 10–35)
Albumin: 4 g/dL (ref 3.6–5.1)
Alkaline Phosphatase: 85 U/L (ref 33–130)
BUN: 11 mg/dL (ref 7–25)
CO2: 25 mmol/L (ref 20–31)
Calcium: 9.4 mg/dL (ref 8.6–10.4)
Chloride: 101 mmol/L (ref 98–110)
Creat: 0.88 mg/dL (ref 0.50–1.05)
Glucose, Bld: 81 mg/dL (ref 65–99)
Potassium: 3.6 mmol/L (ref 3.5–5.3)
Sodium: 138 mmol/L (ref 135–146)
Total Bilirubin: 0.3 mg/dL (ref 0.2–1.2)
Total Protein: 7.3 g/dL (ref 6.1–8.1)

## 2016-08-17 MED ORDER — AMLODIPINE BESYLATE 10 MG PO TABS: 10.0000 mg | ORAL_TABLET | Freq: Every day | ORAL | 12 refills | Status: DC

## 2016-08-17 MED ORDER — HYDROCHLOROTHIAZIDE 25 MG PO TABS: 25.0000 mg | ORAL_TABLET | Freq: Every day | ORAL | 0 refills | Status: DC

## 2016-08-17 NOTE — Patient Instructions (Signed)
Jeffery, Thank you for coming in to clinic today.  1. For your ear - nothing but rest and healing.  Don't put q-tips in your ear or any other object to try to remove anything.  If itches with the scab, lotion or vaseline is okay.  Use a kleenex or washcloth to clean the outside of your ear.  2.  For your blood pressure, we are not changing anything. Keep up your good work!  Please schedule a follow-up appointment with Cassell Smiles, AGNP in 6 months.  If you have any other questions or concerns, please feel free to call the clinic or send a message through [place]. You may also schedule an earlier appointment if necessary.  Cassell Smiles, DNP, AGNP-BC Adult Gerontology Nurse Practitioner [place]

## 2016-08-17 NOTE — Assessment & Plan Note (Signed)
Stable, controlled.  Plan: 1. Continue eating a diet high in vegetables and low in salt. 2. Continue hydrochlorothiazide 25 mg once daily. 3. Continue amlodipine 10 mg once daily. 4. Continue checking BP at home and bring a log to your next appointment. 5. Follow up 6 months

## 2016-08-17 NOTE — Progress Notes (Signed)
Subjective:    Patient ID: Erica Bartlett, female    DOB: November 04, 1955, 61 y.o.   MRN: [id number]  Erica Bartlett is a 61 y.o. female presenting on 08/17/2016 for Hypertension (Patient reports good tolerance and compliance with medications.) and Ear Problem (Patient reports lesion in right ear x's 1 week. 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  She is taking BP readings at home at the end of her workday.   Highest: 140/74 Usual: 130/70   She is taking amlodipine 10 mg daily and HCTZ 25 mg once daily without any known side effects.  Pt denies headache, lightheadedness, dizziness, changes in vision, chest tightness/pressure, palpitations, sudden loss of speech or loss of consiousness.  Ear She first noticed something in her ear she thought was a tick about 5 days ago.  She has tried to pry it loose with Q-tips and other objects.  Kids had ticks on them and brought them into the home.  Now she states it may be scabbed and is irritated.    She denies any rash, fever, chills, sweats, nausea, vomiting, or diarrhea.  Health maintenance: Patient requests mammogram today. Her last one was around 2014.  Social History  Substance Use Topics  . Smoking status: Current Some Day Smoker    Packs/day: 0.15    Types: Cigarettes  . Smokeless tobacco: Former Systems developer  . Alcohol use No  She smokes 1 pack every 7-8 days.  Doesn't smoke every day.  Her smoking trigger is stress associated with caring for her two teenage grandchildren (custodial guardian).  Review of Systems Per HPI unless specifically indicated above     Objective:    BP 105/69 (BP Location: Left Arm, Patient Position: Sitting, Cuff Size: Large)   Pulse 78   Temp 98.5 F (36.9 C) (Oral)   Resp 16   Ht 5' 3.5" (1.613 m)   Wt 181 lb 9.6 oz (82.4 kg)   SpO2 97%   BMI 31.66 kg/m    Wt Readings from  Last 3 Encounters:  08/17/16 181 lb 9.6 oz (82.4 kg)  05/26/16 178 lb (80.7 kg)  04/26/16 175 lb 11.3 oz (79.7 kg)    Physical Exam  Constitutional: She is oriented to person, place, and time. She appears well-developed and well-nourished. No distress.  HENT:  Head: Normocephalic and atraumatic.  Right Ear: Hearing, tympanic membrane and external ear normal. Right ear exhibits lacerations.  Left Ear: Hearing, tympanic membrane, external ear and ear canal normal.  Ears:  Nose: Nose normal.  Mouth/Throat: Oropharynx is clear and moist. No oropharyngeal exudate.  Eyes: Conjunctivae and EOM are normal. Pupils are equal, round, and reactive to light. Right eye exhibits no discharge. Left eye exhibits no discharge.  Neck: Normal range of motion. Neck supple. No JVD present. No tracheal deviation present.  Negative carotid bruit  Cardiovascular: Normal rate, regular rhythm, normal heart sounds and intact distal pulses.  Exam reveals no gallop and no friction rub.   No murmur heard. Pulmonary/Chest: Effort normal and breath sounds normal. No respiratory distress.  Abdominal: Soft. Bowel sounds are normal. She exhibits no distension and no mass. There is no tenderness.  Lymphadenopathy:    She has no cervical adenopathy.  Neurological: She is alert and oriented to person, place, and time.  Skin: Skin is warm and dry.  Psychiatric: She has a normal mood and affect. Her behavior  is normal. Judgment and thought content normal.    Prior Mammogram Results: 07/18/2006: BI-RADS 2    Assessment & Plan:   Problem List Items Addressed This Visit      Cardiovascular and Mediastinum   Hypertension - Primary    Stable, controlled.  Plan: 1. Continue eating a diet high in vegetables and low in salt. 2. Continue hydrochlorothiazide 25 mg once daily. 3. Continue amlodipine 10 mg once daily. 4. Continue checking BP at home and bring a log to your next appointment. 5. Follow up 6 months       Relevant Medications   hydrochlorothiazide (HYDRODIURIL) 25 MG tablet   amLODipine (NORVASC) 10 MG tablet   Other Relevant Orders   Comprehensive metabolic panel    Other Visit Diagnoses    Lesion of external ear canal, right       Self-limited.  Plan:  1. Do Not use Q-tips or other objects inside ear. Apply lotion or vaseline if itching. Cleanse with tissue or washcloth and avoid manipulation. 2. Observe for any rash (bullseye or diffuse, all-over body rash) and notify clinic.   Visit for screening mammogram       Prior mammogram in 2008.  Result BI-RADS 2. Overdue for screening.   Relevant Orders   MM DIGITAL SCREENING BILATERAL      Meds ordered this encounter  Medications  . hydrochlorothiazide (HYDRODIURIL) 25 MG tablet    Sig: Take 1 tablet (25 mg total) by mouth daily.    Dispense:  30 tablet    Refill:  0    Order Specific Question:   Supervising Provider    Answer:   Olin Hauser [[id number]]  . amLODipine (NORVASC) 10 MG tablet    Sig: Take 1 tablet (10 mg total) by mouth daily.    Dispense:  30 tablet    Refill:  12    Order Specific Question:   Supervising Provider    Answer:   Olin Hauser [[id number]]      Follow up plan: Return in about 6 months (around 02/16/2017) for BP, and Medicare Wellness (June), and Annual physical (whenever).   Cassell Smiles, DNP, AGPCNP-BC Adult Gerontology Primary Care Nurse Practitioner [facility] 08/17/2016, 3:44 PM

## 2016-08-18 NOTE — Progress Notes (Signed)
I have reviewed this encounter including the documentation in this note and/or discussed this patient with the provider, Cassell Smiles, AGPCNP-BC. I am certifying that I agree with the content of this note as supervising physician.  [name], [facility] 08/18/2016, 12:16 AM

## 2016-08-29 ENCOUNTER — Telehealth: Payer: Self-pay | Admitting: Nurse Practitioner

## 2016-08-29 NOTE — Telephone Encounter (Signed)
error 

## 2016-08-30 ENCOUNTER — Telehealth: Payer: Self-pay | Admitting: *Deleted

## 2016-08-30 DIAGNOSIS — D693 Immune thrombocytopenic purpura: Secondary | ICD-10-CM

## 2016-08-30 NOTE — Telephone Encounter (Signed)
Asking that Dr Grayland Ormond call in a prescription for steroids for her because she is feeling run down. Please advise

## 2016-08-30 NOTE — Telephone Encounter (Signed)
I do not think that is a good idea. If she is not feeling well, please call her PCP.

## 2016-08-31 ENCOUNTER — Inpatient Hospital Stay: Payer: Medicare HMO | Attending: Oncology

## 2016-08-31 DIAGNOSIS — D693 Immune thrombocytopenic purpura: Secondary | ICD-10-CM | POA: Insufficient documentation

## 2016-08-31 LAB — CBC WITH DIFFERENTIAL/PLATELET
BASOS PCT: 1 %
Basophils Absolute: 0 10*3/uL (ref 0–0.1)
EOS ABS: 0.3 10*3/uL (ref 0–0.7)
Eosinophils Relative: 5 %
HCT: 40.6 % (ref 35.0–47.0)
Hemoglobin: 14.1 g/dL (ref 12.0–16.0)
Lymphocytes Relative: 25 %
Lymphs Abs: 1.3 10*3/uL (ref 1.0–3.6)
MCH: 29.1 pg (ref 26.0–34.0)
MCHC: 34.7 g/dL (ref 32.0–36.0)
MCV: 84 fL (ref 80.0–100.0)
MONO ABS: 0.3 10*3/uL (ref 0.2–0.9)
MONOS PCT: 5 %
NEUTROS PCT: 64 %
Neutro Abs: 3.5 10*3/uL (ref 1.4–6.5)
Platelets: 91 10*3/uL — ABNORMAL LOW (ref 150–440)
RBC: 4.84 MIL/uL (ref 3.80–5.20)
RDW: 14.9 % — AB (ref 11.5–14.5)
WBC: 5.5 10*3/uL (ref 3.6–11.0)

## 2016-08-31 NOTE — Telephone Encounter (Signed)
Patient advised to contact PCP. She states she has contacted her PCP who retired and she saw a NP who only checked her BP and made an appt for mammo and told her to FU with hematologist. She reports she has increased bruising on her thighs, but not as bad as before. She is having spotty bruising on front and back of bilateral upper legs. I have her coming in this afternoon for a lab only appt and she will wait for results.  Do you want anything besides a CBC?

## 2016-08-31 NOTE — Telephone Encounter (Signed)
Per Dr Grayland Ormond just the CBC is to be drawn

## 2016-09-12 ENCOUNTER — Ambulatory Visit
Admission: RE | Admit: 2016-09-12 | Discharge: 2016-09-12 | Disposition: A | Discharge status: Home / Self Care | Payer: Medicare HMO | Source: Ambulatory Visit | Attending: Nurse Practitioner | Admitting: Nurse Practitioner

## 2016-09-12 DIAGNOSIS — Z1231 Encounter for screening mammogram for malignant neoplasm of breast: Secondary | ICD-10-CM | POA: Diagnosis not present

## 2016-09-12 LAB — HM MAMMOGRAPHY

## 2016-09-25 ENCOUNTER — Ambulatory Visit (INDEPENDENT_AMBULATORY_CARE_PROVIDER_SITE_OTHER): Payer: Medicare HMO | Admitting: Nurse Practitioner

## 2016-09-25 ENCOUNTER — Encounter: Payer: Self-pay | Admitting: Nurse Practitioner

## 2016-09-25 VITALS — BP 116/71 | HR 73 | Ht 63.0 in | Wt 181.0 lb

## 2016-09-25 DIAGNOSIS — G5792 Unspecified mononeuropathy of left lower limb: Secondary | ICD-10-CM

## 2016-09-25 DIAGNOSIS — M792 Neuralgia and neuritis, unspecified: Secondary | ICD-10-CM

## 2016-09-25 MED ORDER — GABAPENTIN 100 MG PO CAPS: 100.0000 mg | ORAL_CAPSULE | Freq: Three times a day (TID) | ORAL | 0 refills | Status: DC

## 2016-09-25 MED ORDER — PREDNISONE 20 MG PO TABS: ORAL_TABLET | ORAL | 0 refills | Status: AC

## 2016-09-25 MED ORDER — BACLOFEN 10 MG PO TABS: 10.0000 mg | ORAL_TABLET | Freq: Three times a day (TID) | ORAL | 0 refills | Status: AC

## 2016-09-25 NOTE — Progress Notes (Signed)
Subjective:    Patient ID: Erica Bartlett, female    DOB: 1955-10-01, 61 y.o.   MRN: [id number]  Erica Bartlett is a 61 y.o. female presenting on 09/25/2016 for Leg Pain (constant left leg pain  x 3-4 days )   HPI  Leg pain Pt has had left leg pain described as aching, throbbing, and constant for last 3-4 days.  Aching/throbbing not changed with elevation or other position.  Affects entire left leg without any localized worsening.  Almost fell r/t pain today.  No known injury.  Careful for bruising r/t thrombocytopenia.  Denies cramping sensation, swelling, redness, or localized area of warmth.  July 2017 had shoulder pain that was very similar.  She received treatment with muscle relaxer, steroid, exercises. Lyrica seemed to make that pain worse.  Social History  Substance Use Topics  . Smoking status: Current Some Day Smoker    Packs/day: 0.15    Types: Cigarettes  . Smokeless tobacco: Former Systems developer  . Alcohol use No    Review of Systems Per HPI unless specifically indicated above     Objective:    BP 116/71 (BP Location: Right Arm, Patient Position: Sitting)   Pulse 73   Ht 5\' 3"  (1.6 m)   Wt 181 lb (82.1 kg)   BMI 32.06 kg/m   Wt Readings from Last 3 Encounters:  09/25/16 181 lb (82.1 kg)  08/17/16 181 lb 9.6 oz (82.4 kg)  05/26/16 178 lb (80.7 kg)    Physical Exam  Constitutional: She is oriented to person, place, and time. She appears well-developed and well-nourished. No distress.  HENT:  Head: Normocephalic and atraumatic.  Cardiovascular: Normal rate, regular rhythm, normal heart sounds and intact distal pulses.   Pulmonary/Chest: Effort normal and breath sounds normal. No respiratory distress.  Musculoskeletal: She exhibits no edema, tenderness or deformity.  Left leg Inspection: normal - skin color wnl for ethnicity, non-edematous, non-erythematous Palpation: non-tender to touch. No focal areas of muscle spasm. ROM: limited in all directions r/t  pain Special Testing: Straight leg raise - positive (Right side straight leg raise elicits pain in left leg) Strength: weakened Neurovascular: normal sensation to touch   Neurological: She is alert and oriented to person, place, and time. She has normal reflexes. No cranial nerve deficit.  Skin: Skin is warm and dry.  Psychiatric: She has a normal mood and affect. Her behavior is normal. Judgment and thought content normal.       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  Acute neuropathic pain onset 3-4 days without improvement.  No concern for DVT.  Wells score: -2  Pt's last platelet count = 91 and is low risk for clots.  Probable muscle strain or other lumbar spine neuropathy.  Plan:  Plan:  1. Treat with NSAIDs (acetaminophen and ibuprofen).  Discussed alternate dosing and max dosing. - Take 2 week prednisone taper.  60 mg x 2 days, 40 mg x 8 days, 20 mg x 2 days, 10 mg x 2 days then stop. - Start gabapentin 100 mg tid or 200 mg at night in single dose if drowsiness prevents daytime dosing. - Start baclofen 10 mg tid or 10 mg at night only if drowsiness prevents  daytime dosing. 2. Apply heat and/or ice to affected area. 3. May also apply a muscle rub with lidocaine after heat or ice. 4. Consider doing low back pain exercises as tolerated.  In future may need orthopedic referral or PT referral.         Meds ordered this encounter  Medications  . predniSONE (DELTASONE) 20 MG tablet    Sig: Take 60 mg (3 tablets) 2 days.  Take 40 mg (2 tablets) 8 days.  Take 1 tablet 2 days.  Take 1/2 tablet 2 days.    Dispense:  25 tablet    Refill:  0    Order Specific Question:   Supervising Provider    Answer:   Olin Hauser [[id number]]  . baclofen (LIORESAL) 10 MG tablet     Sig: Take 1 tablet (10 mg total) by mouth 3 (three) times daily.    Dispense:  21 each    Refill:  0    Order Specific Question:   Supervising Provider    Answer:   Olin Hauser [[id number]]  . gabapentin (NEURONTIN) 100 MG capsule    Sig: Take 1 capsule (100 mg total) by mouth 3 (three) times daily. Or 2 capsules (200 mg) at night.    Dispense:  42 capsule    Refill:  0    Order Specific Question:   Supervising Provider    Answer:   Olin Hauser [[id number]]      Follow up plan: Return 7-14 if symptoms worsen or fail to improve.   Cassell Smiles, DNP, AGPCNP-BC Adult Gerontology Primary Care Nurse Practitioner [facility] 09/25/2016, 3:01 PM

## 2016-09-25 NOTE — Progress Notes (Signed)
I have reviewed this encounter including the documentation in this note and/or discussed this patient with the provider, Cassell Smiles, AGPCNP-BC. I am certifying that I agree with the content of this note as supervising physician.  [name], [facility] 09/25/2016, 5:13 PM

## 2016-09-25 NOTE — Patient Instructions (Signed)
Erica Bartlett, Thank you for coming in to clinic today.  1. You are likely having neuropathic or nerve pain in your leg. - Start taking Tylenol extra strength 1 to 2 tablets every 6-8 hours for aches or fever/chills for next few days as needed.  Do not take more than 3,000 mg in 24 hours from all medicines.  May take Ibuprofen as well if tolerated 200-400mg  every 8 hours as needed. - Use heat and ice.  Apply this for 15 minutes at a time 6-8 times per day.   - Muscle rub with lidocaine.  Avoid using this with heat and ice to avoid burns.  - TAKE prednisone taper 20 mg tablets Day 1 (Today): Take 6 pills at one time  Take 3 pills 2 days  Take 2 pills 8 days  Take 1 pill 2 days  Take 1/2 pill 2 days - Take baclofen up to 3 times daily as needed (muscle relaxer) - Take gabapentin 100 mg 3 times daily as needed or 2 tablets (200 mg) at night.  Both gabapentin and baclofen can cause drowsiness.  Be careful when taking them together.  -We can consider orthopedic referral in future.  Please schedule a follow-up appointment with Cassell Smiles, AGNP to Return 7-14 if symptoms worsen or fail to improve.  If you have any other questions or concerns, please feel free to call the clinic or send a message through [place]. You may also schedule an earlier appointment if necessary.  Cassell Smiles, DNP, AGNP-BC Adult Gerontology Nurse Practitioner [facility]             Low Back Pain Exercises  See other page with pictures of each exercise.  Start with 1 or 2 of these exercises that you are most comfortable with. Do not do any exercises that cause you significant worsening pain. Some of these may cause some "stretching soreness" but it should go away after you stop the exercise, and get better over time. Gradually increase up to 3-4 exercises as tolerated.  Standing hamstring stretch: Place the heel of your leg on a stool about 15 inches high. Keep your knee straight.  Lean forward, bending at the hips until you feel a mild stretch in the back of your thigh. Make sure you do not roll your shoulders and bend at the waist when doing this or you will stretch your lower back instead. Hold the stretch for 15 to 30 seconds. Repeat 3 times. Repeat the same stretch on your other leg.  Cat and camel: Get down on your hands and knees. Let your stomach sag, allowing your back to curve downward. Hold this position for 5 seconds. Then arch your back and hold for 5 seconds. Do 3 sets of 10.  Quadriped Arm/Leg Raises: Get down on your hands and knees. Tighten your abdominal muscles to stiffen your spine. While keeping your abdominals tight, raise one arm and the opposite leg away from you. Hold this position for 5 seconds. Lower your arm and leg slowly and alternate sides. Do this 10 times on each side.  Pelvic tilt: Lie on your back with your knees bent and your feet flat on the floor. Tighten your abdominal muscles and push your lower back into the floor. Hold this position for 5 seconds, then relax. Do 3 sets of 10.  Partial curl: Lie on your back with your knees bent and your feet flat on the floor. Tighten your stomach muscles and flatten your back against the  floor. Tuck your chin to your chest. With your hands stretched out in front of you, curl your upper body forward until your shoulders clear the floor. Hold this position for 3 seconds. Don't hold your breath. It helps to breathe out as you lift your shoulders up. Relax. Repeat 10 times. Build to 3 sets of 10. To challenge yourself, clasp your hands behind your head and keep your elbows out to the side.  Lower trunk rotation: Lie on your back with your knees bent and your feet flat on the floor. Tighten your abdominal muscles and push your lower back into the floor. Keeping your shoulders down flat, gently rotate your legs to one side, then the other as far as you can. Repeat 10 to 20 times.  Single knee to chest stretch:  Lie on your back with your legs straight out in front of you. Bring one knee up to your chest and grasp the back of your thigh. Pull your knee toward your chest, stretching your buttock muscle. Hold this position for 15 to 30 seconds and return to the starting position. Repeat 3 times on each side.  Double knee to chest: Lie on your back with your knees bent and your feet flat on the floor. Tighten your abdominal muscles and push your lower back into the floor. Pull both knees up to your chest. Hold for 5 seconds and repeat 10 to 20 times.

## 2016-09-29 ENCOUNTER — Other Ambulatory Visit: Payer: Self-pay

## 2016-09-29 DIAGNOSIS — J40 Bronchitis, not specified as acute or chronic: Secondary | ICD-10-CM

## 2016-09-29 MED ORDER — ALBUTEROL SULFATE HFA 108 (90 BASE) MCG/ACT IN AERS: 2.0000 | INHALATION_SPRAY | RESPIRATORY_TRACT | 1 refills | Status: DC | PRN

## 2016-10-04 DIAGNOSIS — H524 Presbyopia: Secondary | ICD-10-CM | POA: Diagnosis not present

## 2016-10-18 ENCOUNTER — Encounter: Payer: Self-pay | Admitting: Family Medicine

## 2016-10-18 ENCOUNTER — Ambulatory Visit (INDEPENDENT_AMBULATORY_CARE_PROVIDER_SITE_OTHER): Payer: Medicare HMO | Admitting: Family Medicine

## 2016-10-18 VITALS — BP 129/72 | HR 72 | Temp 97.6°F | Ht 63.5 in | Wt 182.6 lb

## 2016-10-18 DIAGNOSIS — J0100 Acute maxillary sinusitis, unspecified: Secondary | ICD-10-CM

## 2016-10-18 DIAGNOSIS — J320 Chronic maxillary sinusitis: Secondary | ICD-10-CM | POA: Diagnosis not present

## 2016-10-18 MED ORDER — GUAIFENESIN 100 MG/5ML PO LIQD: 200.0000 mg | Freq: Three times a day (TID) | ORAL | 0 refills | Status: DC | PRN

## 2016-10-18 MED ORDER — IPRATROPIUM BROMIDE 0.06 % NA SOLN: 2.0000 | Freq: Four times a day (QID) | NASAL | 0 refills | Status: DC

## 2016-10-18 MED ORDER — AMOXICILLIN-POT CLAVULANATE 875-125 MG PO TABS: 1.0000 | ORAL_TABLET | Freq: Two times a day (BID) | ORAL | 0 refills | Status: DC

## 2016-10-18 MED ORDER — FLUTICASONE PROPIONATE 50 MCG/ACT NA SUSP: 2.0000 | Freq: Every day | NASAL | 11 refills | Status: DC

## 2016-10-18 MED ORDER — CETIRIZINE HCL 10 MG PO TABS: 10.0000 mg | ORAL_TABLET | Freq: Every day | ORAL | 11 refills | Status: DC

## 2016-10-18 NOTE — Patient Instructions (Addendum)
Thank you for coming to the clinic today.   1. It sounds like you have a Sinusitis (Bacterial Infection) - this most likely started as an Upper Respiratory Virus that has settled into an infection. Allergies can also cause this. - Start antibiotic Augmentin 1 pill twice daily (breakfast and dinner, with food and plenty of water) for 10 days, complete entire course, do not stop early even if feeling better - Refilled Cetirizine (Zyrtec) allergy pill once daily  Start Atrovent nasal spray decongestant 2 sprays in each nostril up to 4 times daily for 7 days  Start Tessalon Perls take 1 capsule up to 3 times a day as needed for cough  Sent generic Mucinex/Robitussin cough syrup to pharmacy, have them fax [place] a different one that is covered if prefer  ---- After 1-2 weeks once feeling somewhat better, then pick up and start Flonase 2 sprays in each nostril daily for next 4-6 weeks, then you may stop and use seasonally or as needed  - Recommend to try using Nasal Saline spray multiple times a day to help flush out congestion and clear sinuses - Improve hydration by drinking plenty of clear fluids (water, gatorade) to reduce secretions and thin congestion - Congestion draining down throat can cause irritation. May try warm herbal tea with honey, cough drops - Can take Tylenol or Ibuprofen as needed for fevers - May continue over the counter cold medicine as you are, I would not use any decongestant or mucinex longer than 7 days.  If you develop persistent fever >101F for at least 3 consecutive days, headaches with sinus pain or pressure or persistent earache, please schedule a follow-up evaluation within next few days to week.   Please schedule a Follow-up Appointment to: Return in about 2 weeks (around 11/01/2016), or if symptoms worsen or fail to improve, for sinusitis.  If you have any other questions or concerns, please feel free to call the clinic or send a message through [place]. You may also  schedule an earlier appointment if necessary.  Additionally, you may be receiving a survey about your experience at our clinic within a few days to 1 week by e-mail or mail. We value your feedback.  [name], DO [place]

## 2016-10-18 NOTE — Progress Notes (Signed)
Subjective:    Patient ID: Erica Bartlett, female    DOB: Dec 20, 1955, 61 y.o.   MRN: [id number]  Erica Bartlett is a 61 y.o. female presenting on 10/18/2016 for Cough (productive cough, facial pressure, and nasal drainage x 3 days )  Patient presents for a same day appointment.  HPI   SINUSITIS, Chronic, Follow-up: Reports symptoms started 3-4 days ago with thicker maxillary sinus congestion with associated thicker yellow mucus, post nasal drainage, and productive cough, and some sore throat and hoarseness due to coughing. - Recent sick contacts at home with granddaughter and grandson had URI cold symptoms recently - Taking Tylenol, Alka-seltzer plus, Robitussin without significant relief - Currently out of Flonase, used it previously - History of chronic recurrent sinusitis, in past had been seen by ENT, but has been unable to follow-up due to variety of issues cost and balance at [facility], not established with [facility], and now states she is more concerned with her diagnosis of ITP and management by [facility] Heme/Onc - Admits fever and chills x 1 day, since resolved, and still taking anti-pyretics - Denies any fevers/chills, sweats, chest pain, shortness of breath, vomiting   Social History  Substance Use Topics  . Smoking status: Current Some Day Smoker    Packs/day: 0.15    Types: Cigarettes  . Smokeless tobacco: Former Systems developer  . Alcohol use No    Review of Systems Per HPI unless specifically indicated above     Objective:    BP 129/72 (BP Location: Left Arm, Patient Position: Sitting, Cuff Size: Normal)   Pulse 72   Temp 97.6 F (36.4 C) (Oral)   Ht 5' 3.5" (1.613 m)   Wt 182 lb 9.6 oz (82.8 kg)   SpO2 98%   BMI 31.84 kg/m   Wt Readings from Last 3 Encounters:  10/18/16 182 lb 9.6 oz (82.8 kg)  09/25/16 181 lb (82.1 kg)  08/17/16 181 lb 9.6 oz (82.4 kg)    Physical Exam  Constitutional: She is oriented to person, place, and time. She appears  well-developed and well-nourished. No distress.  Currently mild ill-appearing, uncomfortable with sinus pain and congestion, cooperative  HENT:  Head: Normocephalic and atraumatic.  Mouth/Throat: Oropharynx is clear and moist.  Frontal non tender / maxillary sinuses mild tender. Nares patent with some deeper turbinate edema without purulence. Bilateral TMs with mild clear effusion and fullness without bulging without erythema. Oropharynx with mild posterior pharyngeal drainage without erythema, exudates, edema or asymmetry.  Eyes: Conjunctivae are normal. Right eye exhibits no discharge. Left eye exhibits no discharge.  Neck: Normal range of motion. Neck supple. No thyromegaly present.  Cardiovascular: Normal rate and intact distal pulses.   Pulmonary/Chest: Effort normal and breath sounds normal. No respiratory distress. She has no wheezes. She has no rales.  Lymphadenopathy:    She has no cervical adenopathy.  Neurological: She is alert and oriented to person, place, and time.  Skin: Skin is warm and dry. She is not diaphoretic.  Psychiatric: Her behavior is normal.  Nursing note and vitals reviewed.      Assessment & Plan:   Problem List Items Addressed This Visit    Chronic maxillary sinusitis  Complicating factor for recurrent sinus infections,  However now seems to be not recurrent infection.    Relevant Medications   fluticasone (FLONASE) 50 MCG/ACT nasal spray   cetirizine (ZYRTEC) 10 MG tablet   amoxicillin-clavulanate (AUGMENTIN) 875-125 MG tablet   guaiFENesin (ROBITUSSIN) 100 MG/5ML liquid  ipratropium (ATROVENT) 0.06 % nasal spray    Other Visit Diagnoses    Acute non-recurrent maxillary sinusitis    -  Primary  Consistent with early acute maxillary sinusitis, complicated by history of chronic recurrent sinusitis  Plan: 1. Start Augmentin 875-125mg  PO BID x 10 days 2. Start Atrovent nasal spray decongestant 2 sprays in each nostril up to 4 times daily for 7  days 3. Start guaifenesin cough syrup rx 4. After improving plan to resume rx Cetirizine 10mg  daily and rx Flonase 2 sprays in each nostril daily for next 4-6 weeks, then may stop and use seasonally or as needed 5. Supportive care with nasal saline OTC, hydration 6. Return criteria reviewed     Relevant Medications   fluticasone (FLONASE) 50 MCG/ACT nasal spray   cetirizine (ZYRTEC) 10 MG tablet   amoxicillin-clavulanate (AUGMENTIN) 875-125 MG tablet   guaiFENesin (ROBITUSSIN) 100 MG/5ML liquid   ipratropium (ATROVENT) 0.06 % nasal spray      Meds ordered this encounter  Medications  . fluticasone (FLONASE) 50 MCG/ACT nasal spray    Sig: Place 2 sprays into both nostrils daily.    Dispense:  16 g    Refill:  11  . cetirizine (ZYRTEC) 10 MG tablet    Sig: Take 1 tablet (10 mg total) by mouth daily.    Dispense:  30 tablet    Refill:  11  . amoxicillin-clavulanate (AUGMENTIN) 875-125 MG tablet    Sig: Take 1 tablet by mouth 2 (two) times daily.    Dispense:  20 tablet    Refill:  0  . guaiFENesin (ROBITUSSIN) 100 MG/5ML liquid    Sig: Take 10 mLs (200 mg total) by mouth 3 (three) times daily as needed for cough.    Dispense:  120 mL    Refill:  0  . ipratropium (ATROVENT) 0.06 % nasal spray    Sig: Place 2 sprays into both nostrils 4 (four) times daily. For up to 5-7 days then stop.    Dispense:  15 mL    Refill:  0    Follow up plan: Return in about 2 weeks (around 11/01/2016), or if symptoms worsen or fail to improve, for sinusitis.  [name], [facility] 10/18/2016, 4:23 PM

## 2016-10-24 ENCOUNTER — Telehealth: Payer: Self-pay | Admitting: Family Medicine

## 2016-10-24 DIAGNOSIS — J0110 Acute frontal sinusitis, unspecified: Secondary | ICD-10-CM

## 2016-10-24 MED ORDER — LEVOFLOXACIN 500 MG PO TABS: 500.0000 mg | ORAL_TABLET | Freq: Every day | ORAL | 0 refills | Status: DC

## 2016-10-24 NOTE — Telephone Encounter (Signed)
We discussed this in office visit in past had improvement on Levaquin. Will switch rx today, send new rx. Please notify patient to discontinue Augmentin and start new Levaquin 500mg  daily for 7 days, sent to [facility].  Patient should follow-up in future if still not improved within 2 more weeks, as advised before she needs to see ENT, which she is not able to at this time. I have limited other options.  [name], [facility] 10/24/2016, 12:38 PM

## 2016-10-24 NOTE — Telephone Encounter (Signed)
Pt was in last week with congestion and was given augmentin but it doesn't seem to be breaking up.  She asked if she could try levaquin instead.  Her call back number is [phone]

## 2016-10-24 NOTE — Telephone Encounter (Signed)
Pt advised.

## 2016-10-27 ENCOUNTER — Other Ambulatory Visit: Payer: Self-pay

## 2016-10-27 DIAGNOSIS — D696 Thrombocytopenia, unspecified: Secondary | ICD-10-CM

## 2016-10-27 NOTE — Progress Notes (Signed)
[place]  Telephone:(336) [phone] Fax:(336) [phone]  ID: Erica Bartlett OB: 1955/09/13  MR#: [id number]  HFW#:[id number]  Patient Care Team: Mikey College, NP as PCP - General (Nurse Practitioner) Marry Guan Laurice Record, MD (Orthopedic Surgery)  CHIEF COMPLAINT: ITP  INTERVAL HISTORY: Patient returns to clinic today for repeat laboratory work and further evaluation. She has noticed some increase in fatigue as well as bruising over the past several months, but otherwise feels well. She denies any recent fevers or illnesses. She has no neurologic complaints. She denies any chest pain or shortness of breath. She has no nausea, vomiting, constipation, or diarrhea. She has no urinary complaints. Patient offers no further specific complaints today.  REVIEW OF SYSTEMS:   Review of Systems  Constitutional: Positive for malaise/fatigue. Negative for fever and weight loss.  Respiratory: Negative for shortness of breath.   Cardiovascular: Negative.  Negative for chest pain.  Gastrointestinal: Negative.  Negative for blood in stool and melena.  Genitourinary: Negative.  Negative for hematuria.  Musculoskeletal: Negative.  Negative for joint pain.  Skin: Negative.  Negative for rash.  Neurological: Negative for weakness.  Endo/Heme/Allergies: Bruises/bleeds easily.  Psychiatric/Behavioral: The patient is nervous/anxious.     As per HPI. Otherwise, a complete review of systems is negative.  PAST MEDICAL HISTORY: Past Medical History:  Diagnosis Date  . Hypertension     PAST SURGICAL HISTORY: Past Surgical History:  Procedure Laterality Date  . ABDOMINAL HYSTERECTOMY    . BREAST BIOPSY Left    age 82 surgical bx neg  . CESAREAN SECTION    . cyst removed     breast, benign    FAMILY HISTORY: Reviewed and unchanged. No reported history of malignancy or chronic disease.     ADVANCED DIRECTIVES:    HEALTH MAINTENANCE: Social History  Substance Use  Topics  . Smoking status: Current Some Day Smoker    Packs/day: 0.15    Types: Cigarettes  . Smokeless tobacco: Former Systems developer  . Alcohol use No    No Known Allergies  Current Outpatient Prescriptions  Medication Sig Dispense Refill  . acetaminophen (TYLENOL) 500 MG tablet Take 500 mg by mouth every 6 (six) hours as needed.    Marland Kitchen albuterol (PROVENTIL HFA;VENTOLIN HFA) 108 (90 Base) MCG/ACT inhaler Inhale 2 puffs into the lungs every 4 (four) hours as needed for wheezing or shortness of breath (cough). 1 Inhaler 1  . amLODipine (NORVASC) 10 MG tablet Take 1 tablet (10 mg total) by mouth daily. 30 tablet 12  . cetirizine (ZYRTEC) 10 MG tablet Take 1 tablet (10 mg total) by mouth daily. 30 tablet 11  . clonazePAM (KLONOPIN) 1 MG tablet Take 1-2 mg by mouth 3 (three) times daily as needed. Reported on 10/11/2015    . fluticasone (FLONASE) 50 MCG/ACT nasal spray Place 2 sprays into both nostrils daily. 16 g 11  . guaiFENesin (ROBITUSSIN) 100 MG/5ML liquid Take 10 mLs (200 mg total) by mouth 3 (three) times daily as needed for cough. 120 mL 0  . hydrochlorothiazide (HYDRODIURIL) 25 MG tablet Take 1 tablet (25 mg total) by mouth daily. 30 tablet 0  . ipratropium (ATROVENT) 0.06 % nasal spray Place 2 sprays into both nostrils 4 (four) times daily. For up to 5-7 days then stop. 15 mL 0  . levofloxacin (LEVAQUIN) 500 MG tablet Take 1 tablet (500 mg total) by mouth daily. For 7 days 7 tablet 0  . gabapentin (NEURONTIN) 100 MG capsule Take 1 capsule (100 mg total)  by mouth 3 (three) times daily. Or 2 capsules (200 mg) at night. 42 capsule 0   No current facility-administered medications for this visit.     OBJECTIVE: Vitals:   10/30/16 1109  BP: (!) 155/93  Pulse: (!) 56  Resp: 20  Temp: (!) 96.9 F (36.1 C)     Body mass index is 31.46 kg/m.    ECOG FS:0 - Asymptomatic  General: Well-developed, well-nourished, no acute distress. Eyes: Pink conjunctiva, anicteric sclera. Lungs: Clear to  auscultation bilaterally. Heart: Regular rate and rhythm. No rubs, murmurs, or gallops. Abdomen: Soft, nontender, nondistended. No organomegaly noted, normoactive bowel sounds. Musculoskeletal: No edema, cyanosis, or clubbing. Neuro: Alert, answering all questions appropriately. Cranial nerves grossly intact. Skin: No rashes or petechiae noted. Psych: Normal affect.   LAB RESULTS:  Lab Results  Component Value Date   NA 138 08/17/2016   K 3.6 08/17/2016   CL 101 08/17/2016   CO2 25 08/17/2016   GLUCOSE 81 08/17/2016   BUN 11 08/17/2016   CREATININE 0.88 08/17/2016   CALCIUM 9.4 08/17/2016   PROT 7.3 08/17/2016   ALBUMIN 4.0 08/17/2016   AST 25 08/17/2016   ALT 30 (H) 08/17/2016   ALKPHOS 85 08/17/2016   BILITOT 0.3 08/17/2016   GFRNONAA 64 08/04/2015   GFRAA 74 08/04/2015    Lab Results  Component Value Date   WBC 5.1 10/30/2016   NEUTROABS 3.2 10/30/2016   HGB 14.0 10/30/2016   HCT 40.2 10/30/2016   MCV 83.2 10/30/2016   PLT 26 (LL) 10/30/2016     STUDIES: No results found.  ASSESSMENT: Bone marrow biopsy proven ITP.  PLAN:    1. ITP: Bone marrow biopsy performed on July 28, 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 Patient responded to steroids, but the results were not durable. She received cycle 4 of 4 of weekly Rituxan on Aug 30, 2015.  Patient's platelet count is now trending down and is at 26, plus she is symptomatic with bruising. Because of this, she will return this Friday to receive for repeat treatment with cycle 1 of 4 of weekly Rituxan. Return to clinic on Thursday, November 09, 2016 for repeat laboratory work and consideration of cycle 2. 2. Shoulder pain: Patient does not complain of this today.  Approximately 30 minutes was spent in discussion of which greater than 50% was consultation.  Patient expressed understanding and was in agreement with this plan. She also understands that She can call clinic at any time with any  questions, concerns, or complaints.   Lloyd Huger, MD   10/30/2016 11:56 AM

## 2016-10-30 ENCOUNTER — Inpatient Hospital Stay: Payer: Medicare HMO

## 2016-10-30 ENCOUNTER — Inpatient Hospital Stay: Payer: Medicare HMO | Attending: Oncology | Admitting: Oncology

## 2016-10-30 VITALS — BP 155/93 | HR 56 | Temp 96.9°F | Resp 20 | Wt 180.4 lb

## 2016-10-30 DIAGNOSIS — I10 Essential (primary) hypertension: Secondary | ICD-10-CM | POA: Insufficient documentation

## 2016-10-30 DIAGNOSIS — D696 Thrombocytopenia, unspecified: Secondary | ICD-10-CM

## 2016-10-30 DIAGNOSIS — D693 Immune thrombocytopenic purpura: Secondary | ICD-10-CM

## 2016-10-30 DIAGNOSIS — F17210 Nicotine dependence, cigarettes, uncomplicated: Secondary | ICD-10-CM | POA: Diagnosis not present

## 2016-10-30 DIAGNOSIS — Z79899 Other long term (current) drug therapy: Secondary | ICD-10-CM

## 2016-10-30 LAB — CBC WITH DIFFERENTIAL/PLATELET
BASOS PCT: 0 %
Basophils Absolute: 0 10*3/uL (ref 0–0.1)
Eosinophils Absolute: 0.2 10*3/uL (ref 0–0.7)
Eosinophils Relative: 3 %
HEMATOCRIT: 40.2 % (ref 35.0–47.0)
HEMOGLOBIN: 14 g/dL (ref 12.0–16.0)
LYMPHS ABS: 1.4 10*3/uL (ref 1.0–3.6)
LYMPHS PCT: 28 %
MCH: 28.9 pg (ref 26.0–34.0)
MCHC: 34.8 g/dL (ref 32.0–36.0)
MCV: 83.2 fL (ref 80.0–100.0)
MONOS PCT: 5 %
Monocytes Absolute: 0.3 10*3/uL (ref 0.2–0.9)
NEUTROS ABS: 3.2 10*3/uL (ref 1.4–6.5)
NEUTROS PCT: 64 %
Platelets: 26 10*3/uL — CL (ref 150–400)
RBC: 4.84 MIL/uL (ref 3.80–5.20)
RDW: 14.3 % (ref 11.5–14.5)
WBC: 5.1 10*3/uL (ref 3.6–11.0)

## 2016-10-30 NOTE — Progress Notes (Signed)
Patient denies any concerns today.  

## 2016-10-31 ENCOUNTER — Telehealth: Payer: Self-pay | Admitting: Nurse Practitioner

## 2016-10-31 MED ORDER — ONDANSETRON HCL 4 MG PO TABS: 4.0000 mg | ORAL_TABLET | Freq: Three times a day (TID) | ORAL | 0 refills | Status: DC | PRN

## 2016-10-31 NOTE — Telephone Encounter (Signed)
Pt. Called requesting something in for nauseous last antibiotics was working but was making her sick on her stomach. Pt. Call back # is [phone]

## 2016-11-01 NOTE — Telephone Encounter (Signed)
Pt informed

## 2016-11-03 ENCOUNTER — Inpatient Hospital Stay: Payer: Medicare HMO

## 2016-11-03 VITALS — BP 122/80 | HR 60 | Temp 96.0°F | Resp 20

## 2016-11-03 DIAGNOSIS — F17210 Nicotine dependence, cigarettes, uncomplicated: Secondary | ICD-10-CM | POA: Diagnosis not present

## 2016-11-03 DIAGNOSIS — D693 Immune thrombocytopenic purpura: Secondary | ICD-10-CM

## 2016-11-03 DIAGNOSIS — I10 Essential (primary) hypertension: Secondary | ICD-10-CM | POA: Diagnosis not present

## 2016-11-03 DIAGNOSIS — Z79899 Other long term (current) drug therapy: Secondary | ICD-10-CM | POA: Diagnosis not present

## 2016-11-03 MED ORDER — HYDROCORTISONE NA SUCCINATE PF 100 MG IJ SOLR
100.0000 mg | Freq: Once | INTRAMUSCULAR | Status: AC
  Administered 2016-11-03: 100 mg via INTRAVENOUS

## 2016-11-03 MED ORDER — SODIUM CHLORIDE 0.9 % IV SOLN
375.0000 mg/m2 | Freq: Once | INTRAVENOUS | Status: AC
  Administered 2016-11-03: 700 mg via INTRAVENOUS
  Filled 2016-11-03: qty 50

## 2016-11-03 MED ORDER — SODIUM CHLORIDE 0.9 % IV SOLN
Freq: Once | INTRAVENOUS | Status: AC
  Administered 2016-11-03: 09:00:00 via INTRAVENOUS
  Filled 2016-11-03: qty 1000

## 2016-11-03 MED ORDER — DIPHENHYDRAMINE HCL 50 MG/ML IJ SOLN
25.0000 mg | Freq: Once | INTRAMUSCULAR | Status: AC | PRN
  Administered 2016-11-03: 25 mg via INTRAVENOUS

## 2016-11-03 MED ORDER — DIPHENHYDRAMINE HCL 25 MG PO CAPS
25.0000 mg | ORAL_CAPSULE | Freq: Once | ORAL | Status: AC
  Administered 2016-11-03: 25 mg via ORAL
  Filled 2016-11-03: qty 1

## 2016-11-03 MED ORDER — ACETAMINOPHEN 325 MG PO TABS
650.0000 mg | ORAL_TABLET | Freq: Once | ORAL | Status: AC
  Administered 2016-11-03: 650 mg via ORAL
  Filled 2016-11-03: qty 2

## 2016-11-03 NOTE — Progress Notes (Signed)
1045-Pt finished 2nd rate increase, began complaining of itching of throat, Rituxan stopped, Benadryl 25mg  IV and 100mg  Solu cortef given IV. VSS. Will continue to monitor  1120-Pt states throat is better, no itching noted. VSS. Restarted Rituxan at previous rate. Will continue to monitor

## 2016-11-08 NOTE — Progress Notes (Signed)
[place]  Telephone:(336) [phone] Fax:(336) [phone]  ID: Erica Bartlett OB: 01-19-1956  MR#: [id number]  OQH#:[id number]  Patient Care Team: Mikey College, NP as PCP - General (Nurse Practitioner) Marry Guan Laurice Record, MD (Orthopedic Surgery)  CHIEF COMPLAINT: ITP  INTERVAL HISTORY: Patient returns to clinic today for repeat laboratory work and consideration of cycle 2 of weekly Rituxan. She had a mild reaction with her first infusion, but otherwise has felt well and is asymptomatic. She denies any recent fevers or illnesses. She has no neurologic complaints. She denies any chest pain or shortness of breath. She has no nausea, vomiting, constipation, or diarrhea. She has no urinary complaints. Patient offers no further specific complaints today.  REVIEW OF SYSTEMS:   Review of Systems  Constitutional: Negative.  Negative for fever, malaise/fatigue and weight loss.  Respiratory: Negative for shortness of breath.   Cardiovascular: Negative.  Negative for chest pain.  Gastrointestinal: Positive for nausea. Negative for blood in stool and melena.  Genitourinary: Negative.  Negative for hematuria.  Musculoskeletal: Negative.  Negative for joint pain.  Skin: Negative.  Negative for rash.  Neurological: Positive for sensory change. Negative for weakness.  Endo/Heme/Allergies: Does not bruise/bleed easily.  Psychiatric/Behavioral: The patient is nervous/anxious.     As per HPI. Otherwise, a complete review of systems is negative.  PAST MEDICAL HISTORY: Past Medical History:  Diagnosis Date  . Hypertension     PAST SURGICAL HISTORY: Past Surgical History:  Procedure Laterality Date  . ABDOMINAL HYSTERECTOMY    . BREAST BIOPSY Left    age 89 surgical bx neg  . CESAREAN SECTION    . cyst removed     breast, benign    FAMILY HISTORY: Reviewed and unchanged. No reported history of malignancy or chronic disease.     ADVANCED DIRECTIVES:    HEALTH  MAINTENANCE: Social History  Substance Use Topics  . Smoking status: Current Some Day Smoker    Packs/day: 0.15    Types: Cigarettes  . Smokeless tobacco: Former Systems developer  . Alcohol use No    No Known Allergies  Current Outpatient Prescriptions  Medication Sig Dispense Refill  . acetaminophen (TYLENOL) 500 MG tablet Take 500 mg by mouth every 6 (six) hours as needed.    Marland Kitchen albuterol (PROVENTIL HFA;VENTOLIN HFA) 108 (90 Base) MCG/ACT inhaler Inhale 2 puffs into the lungs every 4 (four) hours as needed for wheezing or shortness of breath (cough). 1 Inhaler 1  . amLODipine (NORVASC) 10 MG tablet Take 1 tablet (10 mg total) by mouth daily. 30 tablet 12  . cetirizine (ZYRTEC) 10 MG tablet Take 1 tablet (10 mg total) by mouth daily. 30 tablet 11  . clonazePAM (KLONOPIN) 1 MG tablet Take 1-2 mg by mouth 3 (three) times daily as needed. Reported on 10/11/2015    . fluticasone (FLONASE) 50 MCG/ACT nasal spray Place 2 sprays into both nostrils daily. 16 g 11  . guaiFENesin (ROBITUSSIN) 100 MG/5ML liquid Take 10 mLs (200 mg total) by mouth 3 (three) times daily as needed for cough. 120 mL 0  . hydrochlorothiazide (HYDRODIURIL) 25 MG tablet Take 1 tablet (25 mg total) by mouth daily. 30 tablet 0  . ipratropium (ATROVENT) 0.06 % nasal spray Place 2 sprays into both nostrils 4 (four) times daily. For up to 5-7 days then stop. 15 mL 0  . ondansetron (ZOFRAN) 4 MG tablet Take 1 tablet (4 mg total) by mouth every 8 (eight) hours as needed for nausea or vomiting. Page  tablet 0  . gabapentin (NEURONTIN) 100 MG capsule Take 1 capsule (100 mg total) by mouth 3 (three) times daily. Or 2 capsules (200 mg) at night. 42 capsule 0  . levofloxacin (LEVAQUIN) 500 MG tablet Take 1 tablet (500 mg total) by mouth daily. For 7 days (Patient not taking: Reported on 11/09/2016) 7 tablet 0   No current facility-administered medications for this visit.     OBJECTIVE: Vitals:   11/09/16 0846  BP: 139/88  Pulse: 76  Resp: 20    Temp: (!) 96.9 F (36.1 C)     Body mass index is 31.02 kg/m.    ECOG FS:0 - Asymptomatic  General: Well-developed, well-nourished, no acute distress. Eyes: Pink conjunctiva, anicteric sclera. Lungs: Clear to auscultation bilaterally. Heart: Regular rate and rhythm. No rubs, murmurs, or gallops. Abdomen: Soft, nontender, nondistended. No organomegaly noted, normoactive bowel sounds. Musculoskeletal: No edema, cyanosis, or clubbing. Neuro: Alert, answering all questions appropriately. Cranial nerves grossly intact. Skin: No rashes or petechiae noted. Psych: Normal affect.   LAB RESULTS:  Lab Results  Component Value Date   NA 138 08/17/2016   K 3.6 08/17/2016   CL 101 08/17/2016   CO2 25 08/17/2016   GLUCOSE 81 08/17/2016   BUN 11 08/17/2016   CREATININE 0.88 08/17/2016   CALCIUM 9.4 08/17/2016   PROT 7.3 08/17/2016   ALBUMIN 4.0 08/17/2016   AST 25 08/17/2016   ALT 30 (H) 08/17/2016   ALKPHOS 85 08/17/2016   BILITOT 0.3 08/17/2016   GFRNONAA 64 08/04/2015   GFRAA 74 08/04/2015    Lab Results  Component Value Date   WBC 6.4 11/09/2016   NEUTROABS 4.3 11/09/2016   HGB 15.2 11/09/2016   HCT 43.1 11/09/2016   MCV 82.5 11/09/2016   PLT 31 (L) 11/09/2016     STUDIES: No results found.  ASSESSMENT: Bone marrow biopsy proven ITP.  PLAN:    1. ITP: Bone marrow biopsy performed on July 28, 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 Patient responded to steroids, but the results were not durable. She received cycle 4 of 4 of weekly Rituxan on Aug 30, 2015.  Patient's platelet count is only mildly improved to 31, therefore proceed with cycle 2 of 4 of weekly Rituxan today. Patient will be given additional premedications since she had a mild reaction with cycle 1. Return to clinic in 1 week for consideration of cycle 3. 2. Shoulder pain: Patient does not complain of this today.  Approximately 30 minutes was spent in discussion of which greater  than 50% was consultation.  Patient expressed understanding and was in agreement with this plan. She also understands that She can call clinic at any time with any questions, concerns, or complaints.   Lloyd Huger, MD   11/12/2016 2:55 PM

## 2016-11-09 ENCOUNTER — Inpatient Hospital Stay: Payer: Medicare HMO

## 2016-11-09 ENCOUNTER — Inpatient Hospital Stay (HOSPITAL_BASED_OUTPATIENT_CLINIC_OR_DEPARTMENT_OTHER): Payer: Medicare HMO | Admitting: Oncology

## 2016-11-09 VITALS — BP 139/88 | HR 76 | Temp 96.9°F | Resp 20 | Wt 177.9 lb

## 2016-11-09 DIAGNOSIS — F17210 Nicotine dependence, cigarettes, uncomplicated: Secondary | ICD-10-CM

## 2016-11-09 DIAGNOSIS — Z79899 Other long term (current) drug therapy: Secondary | ICD-10-CM | POA: Diagnosis not present

## 2016-11-09 DIAGNOSIS — D693 Immune thrombocytopenic purpura: Secondary | ICD-10-CM

## 2016-11-09 DIAGNOSIS — I10 Essential (primary) hypertension: Secondary | ICD-10-CM | POA: Diagnosis not present

## 2016-11-09 LAB — CBC WITH DIFFERENTIAL/PLATELET
Basophils Absolute: 0.1 10*3/uL (ref 0–0.1)
Basophils Relative: 1 %
Eosinophils Absolute: 0.4 10*3/uL (ref 0–0.7)
Eosinophils Relative: 6 %
HCT: 43.1 % (ref 35.0–47.0)
Hemoglobin: 15.2 g/dL (ref 12.0–16.0)
Lymphocytes Relative: 20 %
Lymphs Abs: 1.3 10*3/uL (ref 1.0–3.6)
MCH: 29.1 pg (ref 26.0–34.0)
MCHC: 35.2 g/dL (ref 32.0–36.0)
MCV: 82.5 fL (ref 80.0–100.0)
Monocytes Absolute: 0.4 10*3/uL (ref 0.2–0.9)
Monocytes Relative: 6 %
Neutro Abs: 4.3 10*3/uL (ref 1.4–6.5)
Neutrophils Relative %: 67 %
Platelets: 31 10*3/uL — ABNORMAL LOW (ref 150–440)
RBC: 5.22 MIL/uL — ABNORMAL HIGH (ref 3.80–5.20)
RDW: 14.4 % (ref 11.5–14.5)
WBC: 6.4 10*3/uL (ref 3.6–11.0)

## 2016-11-09 MED ORDER — SODIUM CHLORIDE 0.9 % IV SOLN: 10.0000 mg | Freq: Once | INTRAVENOUS | Status: DC

## 2016-11-09 MED ORDER — ACETAMINOPHEN 325 MG PO TABS
650.0000 mg | ORAL_TABLET | Freq: Once | ORAL | Status: AC
  Administered 2016-11-09: 650 mg via ORAL
  Filled 2016-11-09: qty 2

## 2016-11-09 MED ORDER — SODIUM CHLORIDE 0.9 % IV SOLN
Freq: Once | INTRAVENOUS | Status: AC
  Administered 2016-11-09: 10:00:00 via INTRAVENOUS
  Filled 2016-11-09: qty 1000

## 2016-11-09 MED ORDER — DIPHENHYDRAMINE HCL 25 MG PO CAPS
25.0000 mg | ORAL_CAPSULE | Freq: Once | ORAL | Status: AC
  Administered 2016-11-09: 25 mg via ORAL
  Filled 2016-11-09: qty 1

## 2016-11-09 MED ORDER — SODIUM CHLORIDE 0.9 % IV SOLN
375.0000 mg/m2 | Freq: Once | INTRAVENOUS | Status: AC
  Administered 2016-11-09: 700 mg via INTRAVENOUS
  Filled 2016-11-09: qty 50

## 2016-11-09 MED ORDER — SODIUM CHLORIDE 0.9 % IV SOLN: 375.0000 mg/m2 | Freq: Once | INTRAVENOUS | Status: DC

## 2016-11-09 MED ORDER — DEXAMETHASONE SODIUM PHOSPHATE 10 MG/ML IJ SOLN
10.0000 mg | Freq: Once | INTRAMUSCULAR | Status: AC
Start: 2016-11-09 — End: 2016-11-09
  Administered 2016-11-09: 10 mg via INTRAVENOUS
  Filled 2016-11-09: qty 1

## 2016-11-09 MED ORDER — FAMOTIDINE IN NACL 20-0.9 MG/50ML-% IV SOLN
20.0000 mg | Freq: Two times a day (BID) | INTRAVENOUS | Status: DC
  Administered 2016-11-09: 20 mg via INTRAVENOUS
  Filled 2016-11-09: qty 50

## 2016-11-09 NOTE — Progress Notes (Signed)
Patient reports nausea today, tingling in hands.

## 2016-11-15 NOTE — Progress Notes (Signed)
[place]  Telephone:(336) [phone] Fax:(336) [phone]  ID: Erica Bartlett OB: 1955-08-18  MR#: [id number]  BMW#:[id number]  Patient Care Team: Mikey College, NP as PCP - General (Nurse Practitioner) Marry Guan Laurice Record, MD (Orthopedic Surgery)  CHIEF COMPLAINT: ITP  INTERVAL HISTORY: Patient returns to clinic today for repeat laboratory work and consideration of cycle 3 of weekly Rituxan. She had a mild reaction with her first infusion, but otherwise has felt well and is asymptomatic. She denies any recent fevers or illnesses. She has no neurologic complaints. She denies any chest pain or shortness of breath. She has no nausea, vomiting, constipation, or diarrhea. She has no urinary complaints. Patient offers no further specific complaints today.  REVIEW OF SYSTEMS:   Review of Systems  Constitutional: Negative.  Negative for fever, malaise/fatigue and weight loss.  Respiratory: Negative for shortness of breath.   Cardiovascular: Negative.  Negative for chest pain.  Gastrointestinal: Positive for nausea. Negative for blood in stool and melena.  Genitourinary: Negative.  Negative for hematuria.  Musculoskeletal: Negative.  Negative for joint pain.  Skin: Negative.  Negative for rash.  Neurological: Negative for sensory change and weakness.  Endo/Heme/Allergies: Does not bruise/bleed easily.  Psychiatric/Behavioral: The patient is nervous/anxious.     As per HPI. Otherwise, a complete review of systems is negative.  PAST MEDICAL HISTORY: Past Medical History:  Diagnosis Date  . Hypertension     PAST SURGICAL HISTORY: Past Surgical History:  Procedure Laterality Date  . ABDOMINAL HYSTERECTOMY    . BREAST BIOPSY Left    age 84 surgical bx neg  . CESAREAN SECTION    . cyst removed     breast, benign    FAMILY HISTORY: Reviewed and unchanged. No reported history of malignancy or chronic disease.     ADVANCED DIRECTIVES:    HEALTH  MAINTENANCE: Social History  Substance Use Topics  . Smoking status: Current Some Day Smoker    Packs/day: 0.15    Types: Cigarettes  . Smokeless tobacco: Former Systems developer  . Alcohol use No    No Known Allergies  Current Outpatient Prescriptions  Medication Sig Dispense Refill  . acetaminophen (TYLENOL) 500 MG tablet Take 500 mg by mouth every 6 (six) hours as needed.    Marland Kitchen albuterol (PROVENTIL HFA;VENTOLIN HFA) 108 (90 Base) MCG/ACT inhaler Inhale 2 puffs into the lungs every 4 (four) hours as needed for wheezing or shortness of breath (cough). 1 Inhaler 1  . amLODipine (NORVASC) 10 MG tablet Take 1 tablet (10 mg total) by mouth daily. 30 tablet 12  . cetirizine (ZYRTEC) 10 MG tablet Take 1 tablet (10 mg total) by mouth daily. 30 tablet 11  . clonazePAM (KLONOPIN) 1 MG tablet Take 1-2 mg by mouth 3 (three) times daily as needed. Reported on 10/11/2015    . fluticasone (FLONASE) 50 MCG/ACT nasal spray Place 2 sprays into both nostrils daily. 16 g 11  . guaiFENesin (ROBITUSSIN) 100 MG/5ML liquid Take 10 mLs (200 mg total) by mouth 3 (three) times daily as needed for cough. 120 mL 0  . hydrochlorothiazide (HYDRODIURIL) 25 MG tablet Take 1 tablet (25 mg total) by mouth daily. 30 tablet 0  . ipratropium (ATROVENT) 0.06 % nasal spray Place 2 sprays into both nostrils 4 (four) times daily. For up to 5-7 days then stop. 15 mL 0  . ondansetron (ZOFRAN) 4 MG tablet Take 1 tablet (4 mg total) by mouth every 8 (eight) hours as needed for nausea or vomiting. 20 tablet  0  . gabapentin (NEURONTIN) 100 MG capsule Take 1 capsule (100 mg total) by mouth 3 (three) times daily. Or 2 capsules (200 mg) at night. 42 capsule 0   No current facility-administered medications for this visit.     OBJECTIVE: Vitals:   11/16/16 0852  BP: 121/75  Pulse: (!) 112  Resp: 20  Temp: (!) 96.8 F (36 C)     Body mass index is 31.32 kg/m.    ECOG FS:0 - Asymptomatic  General: Well-developed, well-nourished, no acute  distress. Eyes: Pink conjunctiva, anicteric sclera. Lungs: Clear to auscultation bilaterally. Heart: Regular rate and rhythm. No rubs, murmurs, or gallops. Abdomen: Soft, nontender, nondistended. No organomegaly noted, normoactive bowel sounds. Musculoskeletal: No edema, cyanosis, or clubbing. Neuro: Alert, answering all questions appropriately. Cranial nerves grossly intact. Skin: No rashes or petechiae noted. Psych: Normal affect.   LAB RESULTS:  Lab Results  Component Value Date   NA 138 08/17/2016   K 3.6 08/17/2016   CL 101 08/17/2016   CO2 25 08/17/2016   GLUCOSE 81 08/17/2016   BUN 11 08/17/2016   CREATININE 0.88 08/17/2016   CALCIUM 9.4 08/17/2016   PROT 7.3 08/17/2016   ALBUMIN 4.0 08/17/2016   AST 25 08/17/2016   ALT 30 (H) 08/17/2016   ALKPHOS 85 08/17/2016   BILITOT 0.3 08/17/2016   GFRNONAA 64 08/04/2015   GFRAA 74 08/04/2015    Lab Results  Component Value Date   WBC 8.8 11/16/2016   NEUTROABS 6.4 11/16/2016   HGB 14.6 11/16/2016   HCT 41.5 11/16/2016   MCV 83.3 11/16/2016   PLT 77 (L) 11/16/2016     STUDIES: No results found.  ASSESSMENT: Bone marrow biopsy proven ITP.  PLAN:    1. ITP: Bone marrow biopsy performed on July 28, 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 Patient responded to steroids, but the results were not durable. She received cycle 4 of 4 of weekly Rituxan on Aug 30, 2015.  Patient's platelet count is slowly improving to 77, therefore proceed with cycle 3 of 4 of weekly Rituxan today. Patient will be given additional premedications since she had a mild reaction with cycle 1. Return to clinic in 1 week for consideration of cycle 4 and then in 5 weeks for repeat laboratory work and further evaluation. 2. Shoulder pain: Patient does not complain of this today.  Approximately 30 minutes was spent in discussion of which greater than 50% was consultation.  Patient expressed understanding and was in  agreement with this plan. She also understands that She can call clinic at any time with any questions, concerns, or complaints.   Lloyd Huger, MD   11/17/2016 5:00 PM

## 2016-11-16 ENCOUNTER — Inpatient Hospital Stay (HOSPITAL_BASED_OUTPATIENT_CLINIC_OR_DEPARTMENT_OTHER): Payer: Medicare HMO | Admitting: Oncology

## 2016-11-16 ENCOUNTER — Inpatient Hospital Stay: Payer: Medicare HMO

## 2016-11-16 VITALS — BP 121/75 | HR 112 | Temp 96.8°F | Resp 20 | Wt 179.6 lb

## 2016-11-16 VITALS — BP 126/69 | HR 66 | Resp 20

## 2016-11-16 DIAGNOSIS — D693 Immune thrombocytopenic purpura: Secondary | ICD-10-CM

## 2016-11-16 DIAGNOSIS — I10 Essential (primary) hypertension: Secondary | ICD-10-CM | POA: Diagnosis not present

## 2016-11-16 DIAGNOSIS — F17210 Nicotine dependence, cigarettes, uncomplicated: Secondary | ICD-10-CM | POA: Diagnosis not present

## 2016-11-16 DIAGNOSIS — Z79899 Other long term (current) drug therapy: Secondary | ICD-10-CM | POA: Diagnosis not present

## 2016-11-16 LAB — CBC WITH DIFFERENTIAL/PLATELET
BASOS ABS: 0.1 10*3/uL (ref 0–0.1)
BASOS PCT: 1 %
Eosinophils Absolute: 0.3 10*3/uL (ref 0–0.7)
Eosinophils Relative: 4 %
HEMATOCRIT: 41.5 % (ref 35.0–47.0)
HEMOGLOBIN: 14.6 g/dL (ref 12.0–16.0)
Lymphocytes Relative: 16 %
Lymphs Abs: 1.4 10*3/uL (ref 1.0–3.6)
MCH: 29.2 pg (ref 26.0–34.0)
MCHC: 35.1 g/dL (ref 32.0–36.0)
MCV: 83.3 fL (ref 80.0–100.0)
MONOS PCT: 7 %
Monocytes Absolute: 0.6 10*3/uL (ref 0.2–0.9)
NEUTROS ABS: 6.4 10*3/uL (ref 1.4–6.5)
NEUTROS PCT: 72 %
Platelets: 77 10*3/uL — ABNORMAL LOW (ref 150–440)
RBC: 4.98 MIL/uL (ref 3.80–5.20)
RDW: 14.7 % — ABNORMAL HIGH (ref 11.5–14.5)
WBC: 8.8 10*3/uL (ref 3.6–11.0)

## 2016-11-16 MED ORDER — ACETAMINOPHEN 325 MG PO TABS
650.0000 mg | ORAL_TABLET | Freq: Once | ORAL | Status: AC
  Administered 2016-11-16: 650 mg via ORAL
  Filled 2016-11-16: qty 2

## 2016-11-16 MED ORDER — LORAZEPAM 2 MG/ML IJ SOLN
1.0000 mg | Freq: Once | INTRAMUSCULAR | Status: AC
  Administered 2016-11-16: 1 mg via INTRAVENOUS
  Filled 2016-11-16: qty 1

## 2016-11-16 MED ORDER — FAMOTIDINE IN NACL 20-0.9 MG/50ML-% IV SOLN
20.0000 mg | Freq: Two times a day (BID) | INTRAVENOUS | Status: DC
  Administered 2016-11-16: 20 mg via INTRAVENOUS
  Filled 2016-11-16: qty 50

## 2016-11-16 MED ORDER — DEXAMETHASONE SODIUM PHOSPHATE 100 MG/10ML IJ SOLN: 10.0000 mg | Freq: Once | INTRAMUSCULAR | Status: DC

## 2016-11-16 MED ORDER — DIPHENHYDRAMINE HCL 25 MG PO CAPS
25.0000 mg | ORAL_CAPSULE | Freq: Once | ORAL | Status: AC
  Administered 2016-11-16: 25 mg via ORAL
  Filled 2016-11-16: qty 1

## 2016-11-16 MED ORDER — DEXAMETHASONE SODIUM PHOSPHATE 10 MG/ML IJ SOLN
10.0000 mg | Freq: Once | INTRAMUSCULAR | Status: AC
  Administered 2016-11-16: 10 mg via INTRAVENOUS
  Filled 2016-11-16: qty 1

## 2016-11-16 MED ORDER — SODIUM CHLORIDE 0.9 % IV SOLN
Freq: Once | INTRAVENOUS | Status: AC
  Administered 2016-11-16: 10:00:00 via INTRAVENOUS
  Filled 2016-11-16: qty 1000

## 2016-11-16 MED ORDER — RITUXIMAB CHEMO INJECTION 500 MG/50ML: 375.0000 mg/m2 | Freq: Once | INTRAVENOUS | Status: DC

## 2016-11-16 MED ORDER — SODIUM CHLORIDE 0.9 % IV SOLN
375.0000 mg/m2 | Freq: Once | INTRAVENOUS | Status: AC
  Administered 2016-11-16: 700 mg via INTRAVENOUS
  Filled 2016-11-16: qty 50

## 2016-11-16 NOTE — Progress Notes (Signed)
Patient states, "I am feeling really anxious right now." Vital signs obtained and stable. MD, Dr. Grayland Ormond, notified via telephone. MD to place orders. See MAR. Per MD order: Continue with Rituxan infusion at this time.

## 2016-11-16 NOTE — Progress Notes (Signed)
Patient denies any concerns today.  

## 2016-11-17 ENCOUNTER — Other Ambulatory Visit: Payer: Self-pay | Admitting: Oncology

## 2016-11-18 ENCOUNTER — Other Ambulatory Visit: Payer: Self-pay | Admitting: Family Medicine

## 2016-11-18 DIAGNOSIS — J40 Bronchitis, not specified as acute or chronic: Secondary | ICD-10-CM

## 2016-11-23 ENCOUNTER — Inpatient Hospital Stay: Payer: Medicare HMO | Attending: Oncology

## 2016-11-23 ENCOUNTER — Other Ambulatory Visit: Payer: Self-pay

## 2016-11-23 ENCOUNTER — Inpatient Hospital Stay: Payer: Medicare HMO

## 2016-11-23 VITALS — BP 135/80 | HR 57 | Temp 96.8°F | Resp 20

## 2016-11-23 DIAGNOSIS — Z79899 Other long term (current) drug therapy: Secondary | ICD-10-CM | POA: Diagnosis not present

## 2016-11-23 DIAGNOSIS — G4700 Insomnia, unspecified: Secondary | ICD-10-CM | POA: Diagnosis not present

## 2016-11-23 DIAGNOSIS — G8929 Other chronic pain: Secondary | ICD-10-CM | POA: Insufficient documentation

## 2016-11-23 DIAGNOSIS — D693 Immune thrombocytopenic purpura: Secondary | ICD-10-CM | POA: Diagnosis not present

## 2016-11-23 DIAGNOSIS — F17210 Nicotine dependence, cigarettes, uncomplicated: Secondary | ICD-10-CM | POA: Insufficient documentation

## 2016-11-23 DIAGNOSIS — I10 Essential (primary) hypertension: Secondary | ICD-10-CM | POA: Diagnosis not present

## 2016-11-23 LAB — CBC WITH DIFFERENTIAL/PLATELET
BASOS ABS: 0.1 10*3/uL (ref 0–0.1)
BASOS PCT: 1 %
Eosinophils Absolute: 0.5 10*3/uL (ref 0–0.7)
Eosinophils Relative: 6 %
HCT: 39.8 % (ref 35.0–47.0)
Hemoglobin: 13.9 g/dL (ref 12.0–16.0)
LYMPHS PCT: 18 %
Lymphs Abs: 1.4 10*3/uL (ref 1.0–3.6)
MCH: 29.2 pg (ref 26.0–34.0)
MCHC: 35 g/dL (ref 32.0–36.0)
MCV: 83.6 fL (ref 80.0–100.0)
MONO ABS: 0.4 10*3/uL (ref 0.2–0.9)
Monocytes Relative: 5 %
Neutro Abs: 5.7 10*3/uL (ref 1.4–6.5)
Neutrophils Relative %: 70 %
PLATELETS: 135 10*3/uL — AB (ref 150–440)
RBC: 4.76 MIL/uL (ref 3.80–5.20)
RDW: 14.6 % — AB (ref 11.5–14.5)
WBC: 8.1 10*3/uL (ref 3.6–11.0)

## 2016-11-23 MED ORDER — ACETAMINOPHEN 325 MG PO TABS
650.0000 mg | ORAL_TABLET | Freq: Once | ORAL | Status: AC
  Administered 2016-11-23: 650 mg via ORAL
  Filled 2016-11-23: qty 2

## 2016-11-23 MED ORDER — FAMOTIDINE IN NACL 20-0.9 MG/50ML-% IV SOLN
20.0000 mg | Freq: Two times a day (BID) | INTRAVENOUS | Status: DC
  Administered 2016-11-23: 20 mg via INTRAVENOUS
  Filled 2016-11-23: qty 50

## 2016-11-23 MED ORDER — SODIUM CHLORIDE 0.9 % IV SOLN: 10.0000 mg | Freq: Once | INTRAVENOUS | Status: DC

## 2016-11-23 MED ORDER — SODIUM CHLORIDE 0.9 % IV SOLN
375.0000 mg/m2 | Freq: Once | INTRAVENOUS | Status: AC
  Administered 2016-11-23: 700 mg via INTRAVENOUS
  Filled 2016-11-23: qty 50

## 2016-11-23 MED ORDER — SODIUM CHLORIDE 0.9 % IV SOLN
Freq: Once | INTRAVENOUS | Status: AC
  Administered 2016-11-23: 10:00:00 via INTRAVENOUS
  Filled 2016-11-23: qty 1000

## 2016-11-23 MED ORDER — DEXAMETHASONE SODIUM PHOSPHATE 10 MG/ML IJ SOLN
10.0000 mg | Freq: Once | INTRAMUSCULAR | Status: AC
  Administered 2016-11-23: 10 mg via INTRAVENOUS
  Filled 2016-11-23: qty 1

## 2016-11-23 MED ORDER — SODIUM CHLORIDE 0.9 % IV SOLN: 375.0000 mg/m2 | Freq: Once | INTRAVENOUS | Status: DC

## 2016-11-23 MED ORDER — DIPHENHYDRAMINE HCL 25 MG PO CAPS
25.0000 mg | ORAL_CAPSULE | Freq: Once | ORAL | Status: AC
  Administered 2016-11-23: 25 mg via ORAL
  Filled 2016-11-23: qty 1

## 2016-12-19 ENCOUNTER — Other Ambulatory Visit: Payer: Self-pay | Admitting: Nurse Practitioner

## 2016-12-19 DIAGNOSIS — I10 Essential (primary) hypertension: Secondary | ICD-10-CM

## 2016-12-19 NOTE — Progress Notes (Signed)
[facility]  [phone] [phone]  ID: Erica Bartlett OB: 10/29/1955  MR#: [id number]  IRW#:[id number]  Patient Care Team: Mikey College, NP as PCP - General (Nurse Practitioner) Marry Guan Laurice Record, MD (Orthopedic Surgery)  CHIEF COMPLAINT: ITP  INTERVAL HISTORY: Patient returns to clinic today for repeat laboratory work and further evaluation. She completed cycle 4 of weekly Rituxan on November 23, 2016. She currently feels well and is asymptomatic. She complains of chronic back pain and difficulty sleeping. She denies any recent fevers or illnesses. She has no neurologic complaints. She denies any chest pain or shortness of breath. She has no nausea, vomiting, constipation, or diarrhea. She has no urinary complaints. Patient offers no further specific complaints today.  REVIEW OF SYSTEMS:   Review of Systems  Constitutional: Negative.  Negative for fever, malaise/fatigue and weight loss.  Respiratory: Negative for cough and shortness of breath.   Cardiovascular: Negative.  Negative for chest pain and leg swelling.  Gastrointestinal: Positive for nausea. Negative for blood in stool and melena.  Genitourinary: Negative.  Negative for hematuria.  Musculoskeletal: Positive for back pain. Negative for joint pain.  Skin: Negative.  Negative for rash.  Neurological: Negative for sensory change and weakness.  Endo/Heme/Allergies: Does not bruise/bleed easily.  Psychiatric/Behavioral: The patient is nervous/anxious and has insomnia.     As per HPI. Otherwise, a complete review of systems is negative.  PAST MEDICAL HISTORY: Past Medical History:  Diagnosis Date  . Hypertension     PAST SURGICAL HISTORY: Past Surgical History:  Procedure Laterality Date  . ABDOMINAL HYSTERECTOMY    . BREAST BIOPSY Left    age 61 surgical bx neg  . CESAREAN SECTION    . cyst removed     breast, benign    FAMILY HISTORY: Reviewed and unchanged. No  reported history of malignancy or chronic disease.     ADVANCED DIRECTIVES:    HEALTH MAINTENANCE: Social History  Substance Use Topics  . Smoking status: Current Some Day Smoker    Packs/day: 0.15    Types: Cigarettes  . Smokeless tobacco: Former Systems developer  . Alcohol use No    No Known Allergies  Current Outpatient Prescriptions  Medication Sig Dispense Refill  . acetaminophen (TYLENOL) 500 MG tablet Take 500 mg by mouth every 6 (six) hours as needed.    Marland Kitchen amLODipine (NORVASC) 10 MG tablet Take 1 tablet (10 mg total) by mouth daily. 30 tablet 12  . cetirizine (ZYRTEC) 10 MG tablet Take 1 tablet (10 mg total) by mouth daily. 30 tablet 11  . clonazePAM (KLONOPIN) 1 MG tablet Take 1-2 mg by mouth 3 (three) times daily as needed. Reported on 10/11/2015    . fluticasone (FLONASE) 50 MCG/ACT nasal spray Place 2 sprays into both nostrils daily. 16 g 11  . gabapentin (NEURONTIN) 100 MG capsule Take 1 capsule (100 mg total) by mouth 3 (three) times daily. Or 2 capsules (200 mg) at night. 42 capsule 0  . guaiFENesin (ROBITUSSIN) 100 MG/5ML liquid Take 10 mLs (200 mg total) by mouth 3 (three) times daily as needed for cough. 120 mL 0  . hydrochlorothiazide (HYDRODIURIL) 25 MG tablet TAKE 1 TABLET (25 MG TOTAL) BY MOUTH DAILY. 30 tablet 2  . ipratropium (ATROVENT) 0.06 % nasal spray Place 2 sprays into both nostrils 4 (four) times daily. For up to 5-7 days then stop. 15 mL 0  . ondansetron (ZOFRAN) 4 MG tablet Take 1 tablet (4 mg total) by mouth every  8 (eight) hours as needed for nausea or vomiting. 20 tablet 0  . VENTOLIN HFA 108 (90 Base) MCG/ACT inhaler INHALE 2 PUFFS INTO THE LUNGS EVERY 4 HOURS AS NEEDED FOR WHEEZING OR SHORTNESS OF BREATH 18 g 1   No current facility-administered medications for this visit.     OBJECTIVE: Vitals:   12/21/16 1142  BP: (!) 131/91  Pulse: 66  Temp: (!) 97.1 F (36.2 C)     Body mass index is 32.14 kg/m.    ECOG FS:0 - Asymptomatic  General:  Well-developed, well-nourished, no acute distress. Eyes: Pink conjunctiva, anicteric sclera. Lungs: Clear to auscultation bilaterally. Heart: Regular rate and rhythm. No rubs, murmurs, or gallops. Abdomen: Soft, nontender, nondistended. No organomegaly noted, normoactive bowel sounds. Musculoskeletal: No edema, cyanosis, or clubbing. Neuro: Alert, answering all questions appropriately. Cranial nerves grossly intact. Skin: No rashes or petechiae noted. Psych: Normal affect.   LAB RESULTS:  Lab Results  Component Value Date   NA 138 08/17/2016   K 3.6 08/17/2016   CL 101 08/17/2016   CO2 25 08/17/2016   GLUCOSE 81 08/17/2016   BUN 11 08/17/2016   CREATININE 0.88 08/17/2016   CALCIUM 9.4 08/17/2016   PROT 7.3 08/17/2016   ALBUMIN 4.0 08/17/2016   AST 25 08/17/2016   ALT 30 (H) 08/17/2016   ALKPHOS 85 08/17/2016   BILITOT 0.3 08/17/2016   GFRNONAA 64 08/04/2015   GFRAA 74 08/04/2015    Lab Results  Component Value Date   WBC 5.0 12/21/2016   NEUTROABS 2.8 12/21/2016   HGB 14.5 12/21/2016   HCT 42.5 12/21/2016   MCV 83.3 12/21/2016   PLT 131 (L) 12/21/2016     STUDIES: No results found.  ASSESSMENT: Bone marrow biopsy proven ITP.  PLAN:    1. ITP: Bone marrow biopsy performed on July 28, 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 Patient responded to steroids, but the results were not durable. She completed her first round of weekly Rituxan on Aug 30, 2015. This was durable for approximately one year and recently completed 4 weekly cycles of Rituxan on November 23, 2016. Of note, she will require additional premedications in the future since she had a mild reaction with cycle 1. Patient platelet count has significantly improved and is now greater than 130. No intervention is needed at this time. Return to clinic in 6 weeks for laboratory work only and then in 3 months for laboratory work and further evaluation.   2. Back pain: Continue evaluation  and treatment per primary care. 3. Insomnia: Possibly secondary to back pain. Continue follow-up with primary care as above.   Approximately 30 minutes was spent in discussion of which greater than 50% was consultation.  Patient expressed understanding and was in agreement with this plan. She also understands that She can call clinic at any time with any questions, concerns, or complaints.   Lloyd Huger, MD   12/22/2016 10:15 AM

## 2016-12-21 ENCOUNTER — Inpatient Hospital Stay (HOSPITAL_BASED_OUTPATIENT_CLINIC_OR_DEPARTMENT_OTHER): Payer: Medicare HMO | Admitting: Oncology

## 2016-12-21 ENCOUNTER — Inpatient Hospital Stay: Payer: Medicare HMO

## 2016-12-21 ENCOUNTER — Encounter: Payer: Self-pay | Admitting: Oncology

## 2016-12-21 VITALS — BP 131/91 | HR 66 | Temp 97.1°F | Wt 184.3 lb

## 2016-12-21 DIAGNOSIS — F17210 Nicotine dependence, cigarettes, uncomplicated: Secondary | ICD-10-CM

## 2016-12-21 DIAGNOSIS — Z79899 Other long term (current) drug therapy: Secondary | ICD-10-CM | POA: Diagnosis not present

## 2016-12-21 DIAGNOSIS — D693 Immune thrombocytopenic purpura: Secondary | ICD-10-CM | POA: Diagnosis not present

## 2016-12-21 DIAGNOSIS — G8929 Other chronic pain: Secondary | ICD-10-CM | POA: Diagnosis not present

## 2016-12-21 DIAGNOSIS — I10 Essential (primary) hypertension: Secondary | ICD-10-CM | POA: Diagnosis not present

## 2016-12-21 DIAGNOSIS — G4700 Insomnia, unspecified: Secondary | ICD-10-CM | POA: Diagnosis not present

## 2016-12-21 LAB — CBC WITH DIFFERENTIAL/PLATELET
Basophils Absolute: 0.1 10*3/uL (ref 0–0.1)
Basophils Relative: 1 %
Eosinophils Absolute: 0.3 10*3/uL (ref 0–0.7)
Eosinophils Relative: 7 %
HEMATOCRIT: 42.5 % (ref 35.0–47.0)
Hemoglobin: 14.5 g/dL (ref 12.0–16.0)
LYMPHS ABS: 1.5 10*3/uL (ref 1.0–3.6)
Lymphocytes Relative: 30 %
MCH: 28.5 pg (ref 26.0–34.0)
MCHC: 34.2 g/dL (ref 32.0–36.0)
MCV: 83.3 fL (ref 80.0–100.0)
MONOS PCT: 6 %
Monocytes Absolute: 0.3 10*3/uL (ref 0.2–0.9)
NEUTROS ABS: 2.8 10*3/uL (ref 1.4–6.5)
Neutrophils Relative %: 56 %
Platelets: 131 10*3/uL — ABNORMAL LOW (ref 150–440)
RBC: 5.1 MIL/uL (ref 3.80–5.20)
RDW: 14.8 % — ABNORMAL HIGH (ref 11.5–14.5)
WBC: 5 10*3/uL (ref 3.6–11.0)

## 2017-01-23 ENCOUNTER — Ambulatory Visit (INDEPENDENT_AMBULATORY_CARE_PROVIDER_SITE_OTHER): Payer: Medicare HMO | Admitting: Nurse Practitioner

## 2017-01-23 ENCOUNTER — Encounter: Payer: Self-pay | Admitting: Nurse Practitioner

## 2017-01-23 VITALS — BP 151/75 | HR 64 | Temp 98.2°F | Ht 63.5 in | Wt 182.6 lb

## 2017-01-23 DIAGNOSIS — J0100 Acute maxillary sinusitis, unspecified: Secondary | ICD-10-CM

## 2017-01-23 DIAGNOSIS — J069 Acute upper respiratory infection, unspecified: Secondary | ICD-10-CM | POA: Diagnosis not present

## 2017-01-23 MED ORDER — IPRATROPIUM BROMIDE 0.06 % NA SOLN: 2.0000 | Freq: Four times a day (QID) | NASAL | 0 refills | Status: DC

## 2017-01-23 MED ORDER — BENZONATATE 100 MG PO CAPS: 100.0000 mg | ORAL_CAPSULE | Freq: Two times a day (BID) | ORAL | 0 refills | Status: AC | PRN

## 2017-01-23 NOTE — Patient Instructions (Addendum)
Erica Bartlett, Thank you for coming in to clinic today.  1. Avoid all decongestants in your medications.  It sounds like you have a Upper Respiratory Virus - this will most likely run it's course in 7 to 10 days. Recommend good hand washing. - Start Atrovent nasal spray decongestant 2 sprays each nostril up to 4 times daily for 5-7 days - Start cetirizine (Zyrtec) 10mg  daily - If congestion is worse, start OTC Mucinex (or may try Mucinex-DM for cough) up to 7-10 days then stop - Drink plenty of fluids to improve congestion - You may try over the counter Nasal Saline spray (Simply Saline, Ocean Spray) as needed to reduce congestion. - Drink warm herbal tea with honey for sore throat. - Start taking Tylenol extra strength 1 to 2 tablets every 6-8 hours for aches or fever/chills for next few days as needed.  Do not take more than 3,000 mg in 24 hours from all medicines.  May take Ibuprofen as well if tolerated 200-400mg  every 8 hours as needed.  If symptoms significantly worsening with persistent fevers/chills despite tylenol/ibpurofen, nausea, vomiting unable to tolerate food/fluids or medicine, body aches, or shortness of breath, sinus pain pressure or worsening productive cough, then follow-up for re-evaluation, may seek more immediate care at Urgent Care or ED if more concerned for emergency.   Please schedule a follow-up appointment with Cassell Smiles, AGNP. Return 3-5 days if symptoms worsen or fail to improve.  If you have any other questions or concerns, please feel free to call the clinic or send a message through [place]. You may also schedule an earlier appointment if necessary.  You will receive a survey after today's visit either digitally by e-mail or paper by [employer]. Your experiences and feedback matter to [place].  Please respond so we know how we are doing as we provide care for you.  Cassell Smiles, DNP, AGNP-BC Adult Gerontology Nurse Practitioner [place]

## 2017-01-23 NOTE — Progress Notes (Signed)
Subjective:    Patient ID: Erica Bartlett, female    DOB: [birth year], 61 y.o.   MRN: [id number]  Lynzy Rawles Cobarrubias is a 61 y.o. female presenting on 01/23/2017 for Sinus Problem (sore throat, productive cough, headache, facial pressure x 2 days )   HPI URI Pt presents w/ symptoms of uri onset 2 days.  Symptoms include: - Sinus pressure and scratchy throat started Sunday. - Cough w/ chest congestion. - Itchy eyes w/ drainage. - Rhinorrhea. - Last night felt hot/cold.  But has menopausal hot flashes.  Last night was different w/ chills.  Denies ear pressure/pain/fullness  Started Robitussin DM today.  Has not taken any tylenol or ibuprofen for fever.  Oncology/Chemo currently: Last chemo dose: 11/23/16 WBC 8/30 = 5.0 w/ baseline 8.0-8.3  Social History  Substance Use Topics  . Smoking status: Current Some Day Smoker    Packs/day: 0.15    Types: Cigarettes  . Smokeless tobacco: Former Systems developer  . Alcohol use No    Review of Systems Per HPI unless specifically indicated above     Objective:    BP (!) 151/75 (BP Location: Right Arm, Patient Position: Sitting, Cuff Size: Normal)   Pulse 64   Temp 98.2 F (36.8 C) (Oral)   Ht 5' 3.5" (1.613 m)   Wt 182 lb 9.6 oz (82.8 kg)   SpO2 100%   BMI 31.84 kg/m   Wt Readings from Last 3 Encounters:  01/23/17 182 lb 9.6 oz (82.8 kg)  12/21/16 184 lb 4.8 oz (83.6 kg)  11/16/16 179 lb 9.6 oz (81.5 kg)    Physical Exam  General - overweight, ill-appearing, NAD HEENT - Normocephalic, atraumatic, PERRL, EOMI, patent nares w/o congestion, oropharynx clear, MMM, TM normal, Ear canal normal, external ear normal w/o lesions.  Bilateral frontal and maxillary sinus tenderness Neck - supple, non-tender, anterior cervical LAD Heart - RRR, no murmurs heard Lungs - Clear throughout all lobes, no wheezing, crackles, or rhonchi. Normal work of breathing. Negative egophony. Extremeties - non-tender, no edema, cap refill < 2 seconds,  peripheral pulses intact +2 bilaterally Skin - warm, dry, no rashes Neuro - awake, alert, oriented x3, normal gait Psych - Normal mood and affect, normal behavior   Results for orders placed or performed in visit on 12/21/16  CBC with Differential/Platelet  Result Value Ref Range   WBC 5.0 3.6 - 11.0 K/uL   RBC 5.10 3.80 - 5.20 MIL/uL   Hemoglobin 14.5 12.0 - 16.0 g/dL   HCT 42.5 35.0 - 47.0 %   MCV 83.3 80.0 - 100.0 fL   MCH 28.5 26.0 - 34.0 pg   MCHC 34.2 32.0 - 36.0 g/dL   RDW 14.8 (H) 11.5 - 14.5 %   Platelets 131 (L) 150 - 440 K/uL   Neutrophils Relative % 56 %   Neutro Abs 2.8 1.4 - 6.5 K/uL   Lymphocytes Relative 30 %   Lymphs Abs 1.5 1.0 - 3.6 K/uL   Monocytes Relative 6 %   Monocytes Absolute 0.3 0.2 - 0.9 K/uL   Eosinophils Relative 7 %   Eosinophils Absolute 0.3 0 - 0.7 K/uL   Basophils Relative 1 %   Basophils Absolute 0.1 0 - 0.1 K/uL      Assessment & Plan:   Problem List Items Addressed This Visit    None    Visit Diagnoses    Acute URI Acute illness. Fever responsive to NSAIDs and tylenol.  Symptoms not worsening. Consistent with viral illness x  2 days with no known sick contacts and no identifiable focal infections of ears, nose, throat.  Possible need for antibiotics in this patient since she has ongoing chemo and last WBC was lower than baseline.  Plan: 1. Reassurance, likely self-limited with cough lasting up to few weeks.  Reassured also that may need antibiotics sooner if continuing to worsen over next 24-48 hours.  Pt to call clinic with updates. - Start Atrovent nasal spray decongestant 2 sprays each nostril up to 4 times daily for 5-7 days - Start anti-histamine Cetirizine 10mg  daily,  - also can use Flonase 2 sprays each nostril daily for up to 4-6 weeks - Start Mucinex-DM OTC up to 7-10 days then stop - Start tessalon perles 100 mg every 12 hours as needed for cough. 2. Supportive care with nasal saline, warm herbal tea with honey, 3. Improve  hydration 4. Tylenol / Motrin PRN fevers 5. Return criteria given   Update: pt called and reported worsening symptoms 24 hours after visit.  Provided antibiotic Augmentin for 10 day course.   Relevant Medications   ipratropium (ATROVENT) 0.06 % nasal spray   benzonatate (TESSALON) 100 MG capsule      Meds ordered this encounter  Medications  . ipratropium (ATROVENT) 0.06 % nasal spray    Sig: Place 2 sprays into both nostrils 4 (four) times daily. For up to 5-7 days then stop.    Dispense:  15 mL    Refill:  0    Order Specific Question:   Supervising Provider    Answer:   Olin Hauser [[id number]]  . benzonatate (TESSALON) 100 MG capsule    Sig: Take 1 capsule (100 mg total) by mouth 2 (two) times daily as needed for cough.    Dispense:  30 capsule    Refill:  0    Order Specific Question:   Supervising Provider    Answer:   Olin Hauser [[id number]]     Follow up plan: Return 3-5 days if symptoms worsen or fail to improve.  Cassell Smiles, DNP, AGPCNP-BC Adult Gerontology Primary Care Nurse Practitioner [facility] 01/23/2017, 6:08 PM

## 2017-01-24 ENCOUNTER — Telehealth: Payer: Self-pay | Admitting: Nurse Practitioner

## 2017-01-24 ENCOUNTER — Encounter: Payer: Self-pay | Admitting: Nurse Practitioner

## 2017-01-24 MED ORDER — AMOXICILLIN-POT CLAVULANATE 875-125 MG PO TABS: 1.0000 | ORAL_TABLET | Freq: Two times a day (BID) | ORAL | 0 refills | Status: AC

## 2017-01-24 MED ORDER — DOXYCYCLINE HYCLATE 100 MG PO TABS: 100.0000 mg | ORAL_TABLET | Freq: Two times a day (BID) | ORAL | 0 refills | Status: DC

## 2017-01-24 NOTE — Telephone Encounter (Signed)
-----   Message from Wilson Singer, [place] sent at 01/24/2017  1:56 PM EDT ----- Regarding: allergy   The pharmacy called to inform us that when the pt came to pick up her prescription she said she was allergic doxycycline.

## 2017-01-24 NOTE — Telephone Encounter (Signed)
Will discontinue doxycycline and send augmentin instead.  There were no documented allergies in Epic at the time of writing the order.  Will update allergies.

## 2017-01-25 ENCOUNTER — Telehealth: Payer: Self-pay

## 2017-01-25 ENCOUNTER — Ambulatory Visit: Payer: Medicare HMO | Admitting: Nurse Practitioner

## 2017-01-25 NOTE — Telephone Encounter (Signed)
The pt called in today requesting an appt to be seen because she had a syncope episode yesterday an hour after taking her abx. She does admit she had not eating anything and was not hydrated. I spoke w/ Ander Purpura and per Ander Purpura the pt needs to stay hydrated, eat, and rest. I also informed the pt if her symptoms worsen or she develop symptoms of lightheadedness, dizziness, or syncope. She needs to contact EMS. The pt verbalize understanding, she requested a work note.

## 2017-02-01 ENCOUNTER — Inpatient Hospital Stay: Payer: Medicare HMO | Attending: Oncology

## 2017-02-01 DIAGNOSIS — I10 Essential (primary) hypertension: Secondary | ICD-10-CM | POA: Insufficient documentation

## 2017-02-01 DIAGNOSIS — Z79899 Other long term (current) drug therapy: Secondary | ICD-10-CM | POA: Insufficient documentation

## 2017-02-01 DIAGNOSIS — D693 Immune thrombocytopenic purpura: Secondary | ICD-10-CM

## 2017-02-01 DIAGNOSIS — G8929 Other chronic pain: Secondary | ICD-10-CM | POA: Insufficient documentation

## 2017-02-01 DIAGNOSIS — G4700 Insomnia, unspecified: Secondary | ICD-10-CM | POA: Diagnosis not present

## 2017-02-01 DIAGNOSIS — F17210 Nicotine dependence, cigarettes, uncomplicated: Secondary | ICD-10-CM | POA: Diagnosis not present

## 2017-02-01 LAB — CBC WITH DIFFERENTIAL/PLATELET
Basophils Absolute: 0.1 10*3/uL (ref 0–0.1)
Basophils Relative: 2 %
EOS ABS: 0.4 10*3/uL (ref 0–0.7)
EOS PCT: 6 %
HCT: 44.5 % (ref 35.0–47.0)
HEMOGLOBIN: 15 g/dL (ref 12.0–16.0)
LYMPHS ABS: 1.7 10*3/uL (ref 1.0–3.6)
Lymphocytes Relative: 24 %
MCH: 28.8 pg (ref 26.0–34.0)
MCHC: 33.8 g/dL (ref 32.0–36.0)
MCV: 85.3 fL (ref 80.0–100.0)
MONOS PCT: 6 %
Monocytes Absolute: 0.4 10*3/uL (ref 0.2–0.9)
Neutro Abs: 4.5 10*3/uL (ref 1.4–6.5)
Neutrophils Relative %: 62 %
PLATELETS: 179 10*3/uL (ref 150–440)
RBC: 5.22 MIL/uL — ABNORMAL HIGH (ref 3.80–5.20)
RDW: 14.9 % — AB (ref 11.5–14.5)
WBC: 7.1 10*3/uL (ref 3.6–11.0)

## 2017-02-05 ENCOUNTER — Telehealth: Payer: Self-pay

## 2017-02-05 ENCOUNTER — Telehealth: Payer: Self-pay | Admitting: Nurse Practitioner

## 2017-02-05 DIAGNOSIS — J0141 Acute recurrent pansinusitis: Secondary | ICD-10-CM

## 2017-02-05 MED ORDER — LEVOFLOXACIN 500 MG PO TABS: 500.0000 mg | ORAL_TABLET | Freq: Every day | ORAL | 0 refills | Status: AC

## 2017-02-05 NOTE — Telephone Encounter (Signed)
Pt w/ prior difficulty w/ sinusitis and now recurrent problems that are resistant to antibiotic treatment in pt who is allergic to doxycycline.  Will send levofloxacin 500 mg tab Take 1 tab po bid x 7 days.  Pt also needs to be seen and evaluated by ENT. Referral to [facility].  If unable to be seen there, will need to go to [facility] or [facility] by patient preference.

## 2017-02-05 NOTE — Telephone Encounter (Signed)
Pt. Called states she was still having mucus drainage in throat have requested someone to call her back. Pt call back # is  [phone]

## 2017-02-05 NOTE — Telephone Encounter (Signed)
The pt states she finished her Augmentin and her symptoms still persist chest congestion, head pressure, productive coughing w/ yellowish color phlegm and body aches. She reports no fever or chills. She is taking OTC medication Tylenol & Mucinex DM.

## 2017-02-06 NOTE — Telephone Encounter (Signed)
Attempted to contact the pt, no answer. LMOM to return my call.  

## 2017-02-07 NOTE — Telephone Encounter (Signed)
The pt was notified, No questions or concerns. 

## 2017-03-25 NOTE — Progress Notes (Deleted)
Erica Bartlett  Telephone:(336) [phone] [phone]) [phone]  ID: Shalawn Wynder Cadena OB: 06/17/1955  MR#: [id number]  KKX#:[id number]  Patient Care Team: Mikey College, NP as PCP - General (Nurse Practitioner) Marry Guan Laurice Record, MD (Orthopedic Surgery)  CHIEF COMPLAINT: ITP  INTERVAL HISTORY: Patient returns to clinic today for repeat laboratory work and further evaluation. She completed cycle 4 of weekly Rituxan on November 23, 2016. She currently feels well and is asymptomatic. She complains of chronic back pain and difficulty sleeping. She denies any recent fevers or illnesses. She has no neurologic complaints. She denies any chest pain or shortness of breath. She has no nausea, vomiting, constipation, or diarrhea. She has no urinary complaints. Patient offers no further specific complaints today.  REVIEW OF SYSTEMS:   Review of Systems  Constitutional: Negative.  Negative for fever, malaise/fatigue and weight loss.  Respiratory: Negative for cough and shortness of breath.   Cardiovascular: Negative.  Negative for chest pain and leg swelling.  Gastrointestinal: Positive for nausea. Negative for blood in stool and melena.  Genitourinary: Negative.  Negative for hematuria.  Musculoskeletal: Positive for back pain. Negative for joint pain.  Skin: Negative.  Negative for rash.  Neurological: Negative for sensory change and weakness.  Endo/Heme/Allergies: Does not bruise/bleed easily.  Psychiatric/Behavioral: The patient is nervous/anxious and has insomnia.     As per HPI. Otherwise, a complete review of systems is negative.  PAST MEDICAL HISTORY: Past Medical History:  Diagnosis Date  . Hypertension     PAST SURGICAL HISTORY: Past Surgical History:  Procedure Laterality Date  . ABDOMINAL HYSTERECTOMY    . BREAST BIOPSY Left    age 61 surgical bx neg  . CESAREAN SECTION    . cyst removed     breast, benign    FAMILY HISTORY: Reviewed and unchanged. No  reported history of malignancy or chronic disease.     ADVANCED DIRECTIVES:    HEALTH MAINTENANCE: Social History   Tobacco Use  . Smoking status: Current Some Day Smoker    Packs/day: 0.15    Types: Cigarettes  . Smokeless tobacco: Former Network engineer Use Topics  . Alcohol use: No  . Drug use: No    Allergies  Allergen Reactions  . Doxycycline Nausea And Vomiting    Current Outpatient Medications  Medication Sig Dispense Refill  . acetaminophen (TYLENOL) 500 MG tablet Take 500 mg by mouth every 6 (six) hours as needed.    Marland Kitchen amLODipine (NORVASC) 10 MG tablet Take 1 tablet (10 mg total) by mouth daily. 30 tablet 12  . cetirizine (ZYRTEC) 10 MG tablet Take 1 tablet (10 mg total) by mouth daily. (Patient not taking: Reported on 01/23/2017) 30 tablet 11  . clonazePAM (KLONOPIN) 1 MG tablet Take 1-2 mg by mouth 3 (three) times daily as needed. Reported on 10/11/2015    . fluticasone (FLONASE) 50 MCG/ACT nasal spray Place 2 sprays into both nostrils daily. 16 g 11  . gabapentin (NEURONTIN) 100 MG capsule Take 1 capsule (100 mg total) by mouth 3 (three) times daily. Or 2 capsules (200 mg) at night. 42 capsule 0  . guaiFENesin (ROBITUSSIN) 100 MG/5ML liquid Take 10 mLs (200 mg total) by mouth 3 (three) times daily as needed for cough. 120 mL 0  . hydrochlorothiazide (HYDRODIURIL) 25 MG tablet TAKE 1 TABLET (25 MG TOTAL) BY MOUTH DAILY. 30 tablet 2  . ipratropium (ATROVENT) 0.06 % nasal spray Place 2 sprays into both nostrils 4 (four) times daily. For up  to 5-7 days then stop. 15 mL 0  . ondansetron (ZOFRAN) 4 MG tablet Take 1 tablet (4 mg total) by mouth every 8 (eight) hours as needed for nausea or vomiting. (Patient not taking: Reported on 01/23/2017) 20 tablet 0  . VENTOLIN HFA 108 (90 Base) MCG/ACT inhaler INHALE 2 PUFFS INTO THE LUNGS EVERY 4 HOURS AS NEEDED FOR WHEEZING OR SHORTNESS OF BREATH 18 g 1   No current facility-administered medications for this visit.      OBJECTIVE: There were no vitals filed for this visit.   There is no height or weight on file to calculate BMI.    ECOG FS:0 - Asymptomatic  General: Well-developed, well-nourished, no acute distress. Eyes: Pink conjunctiva, anicteric sclera. Lungs: Clear to auscultation bilaterally. Heart: Regular rate and rhythm. No rubs, murmurs, or gallops. Abdomen: Soft, nontender, nondistended. No organomegaly noted, normoactive bowel sounds. Musculoskeletal: No edema, cyanosis, or clubbing. Neuro: Alert, answering all questions appropriately. Cranial nerves grossly intact. Skin: No rashes or petechiae noted. Psych: Normal affect.   LAB RESULTS:  Lab Results  Component Value Date   NA 138 08/17/2016   K 3.6 08/17/2016   CL 101 08/17/2016   CO2 25 08/17/2016   GLUCOSE 81 08/17/2016   BUN 11 08/17/2016   CREATININE 0.88 08/17/2016   CALCIUM 9.4 08/17/2016   PROT 7.3 08/17/2016   ALBUMIN 4.0 08/17/2016   AST 25 08/17/2016   ALT 30 (H) 08/17/2016   ALKPHOS 85 08/17/2016   BILITOT 0.3 08/17/2016   GFRNONAA 64 08/04/2015   GFRAA 74 08/04/2015    Lab Results  Component Value Date   WBC 7.1 02/01/2017   NEUTROABS 4.5 02/01/2017   HGB 15.0 02/01/2017   HCT 44.5 02/01/2017   MCV 85.3 02/01/2017   PLT 179 02/01/2017     STUDIES: No results found.  ASSESSMENT: Bone marrow biopsy proven ITP.  PLAN:    1. ITP: Bone marrow biopsy performed on July 28, 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 Patient responded to steroids, but the results were not durable. She completed her first round of weekly Rituxan on Aug 30, 2015. This was durable for approximately one year and recently completed 4 weekly cycles of Rituxan on November 23, 2016. Of note, she will require additional premedications in the future since she had a mild reaction with cycle 1. Patient platelet count has significantly improved and is now greater than 130. No intervention is needed at this time.  Return to clinic in 6 weeks for laboratory work only and then in 3 months for laboratory work and further evaluation.   2. Back pain: Continue evaluation and treatment per primary care. 3. Insomnia: Possibly secondary to back pain. Continue follow-up with primary care as above.   Approximately 30 minutes was spent in discussion of which greater than 50% was consultation.  Patient expressed understanding and was in agreement with this plan. She also understands that She can call clinic at any time with any questions, concerns, or complaints.   Lloyd Huger, MD   03/25/2017 10:33 AM

## 2017-03-26 ENCOUNTER — Inpatient Hospital Stay: Payer: Medicare HMO | Admitting: Oncology

## 2017-03-26 ENCOUNTER — Inpatient Hospital Stay: Payer: Medicare HMO | Attending: Oncology

## 2017-03-26 DIAGNOSIS — I10 Essential (primary) hypertension: Secondary | ICD-10-CM | POA: Insufficient documentation

## 2017-03-26 DIAGNOSIS — D693 Immune thrombocytopenic purpura: Secondary | ICD-10-CM | POA: Insufficient documentation

## 2017-03-26 DIAGNOSIS — G8929 Other chronic pain: Secondary | ICD-10-CM | POA: Insufficient documentation

## 2017-03-26 DIAGNOSIS — F17210 Nicotine dependence, cigarettes, uncomplicated: Secondary | ICD-10-CM | POA: Insufficient documentation

## 2017-03-26 DIAGNOSIS — Z79899 Other long term (current) drug therapy: Secondary | ICD-10-CM | POA: Insufficient documentation

## 2017-03-26 DIAGNOSIS — G4700 Insomnia, unspecified: Secondary | ICD-10-CM | POA: Insufficient documentation

## 2017-03-26 LAB — CBC WITH DIFFERENTIAL/PLATELET
BASOS ABS: 0.1 10*3/uL (ref 0–0.1)
BASOS PCT: 1 %
EOS ABS: 0.1 10*3/uL (ref 0–0.7)
EOS PCT: 1 %
HCT: 43.4 % (ref 35.0–47.0)
Hemoglobin: 14.7 g/dL (ref 12.0–16.0)
Lymphocytes Relative: 24 %
Lymphs Abs: 1.4 10*3/uL (ref 1.0–3.6)
MCH: 28.9 pg (ref 26.0–34.0)
MCHC: 33.9 g/dL (ref 32.0–36.0)
MCV: 85.4 fL (ref 80.0–100.0)
MONO ABS: 0.3 10*3/uL (ref 0.2–0.9)
Monocytes Relative: 5 %
Neutro Abs: 4 10*3/uL (ref 1.4–6.5)
Neutrophils Relative %: 69 %
PLATELETS: 159 10*3/uL (ref 150–440)
RBC: 5.09 MIL/uL (ref 3.80–5.20)
RDW: 15.2 % — AB (ref 11.5–14.5)
WBC: 5.8 10*3/uL (ref 3.6–11.0)

## 2017-04-10 ENCOUNTER — Other Ambulatory Visit: Payer: Self-pay

## 2017-04-10 DIAGNOSIS — I10 Essential (primary) hypertension: Secondary | ICD-10-CM

## 2017-04-10 MED ORDER — HYDROCHLOROTHIAZIDE 25 MG PO TABS: 25.0000 mg | ORAL_TABLET | Freq: Every day | ORAL | 2 refills | Status: DC

## 2017-04-18 ENCOUNTER — Ambulatory Visit: Payer: Medicare HMO | Admitting: Nurse Practitioner

## 2017-04-18 ENCOUNTER — Encounter: Payer: Self-pay | Admitting: Nurse Practitioner

## 2017-04-18 VITALS — BP 110/62 | HR 66 | Temp 98.4°F | Resp 15 | Ht 63.5 in | Wt 173.8 lb

## 2017-04-18 DIAGNOSIS — J0100 Acute maxillary sinusitis, unspecified: Secondary | ICD-10-CM | POA: Diagnosis not present

## 2017-04-18 DIAGNOSIS — M5441 Lumbago with sciatica, right side: Secondary | ICD-10-CM | POA: Diagnosis not present

## 2017-04-18 DIAGNOSIS — J019 Acute sinusitis, unspecified: Secondary | ICD-10-CM | POA: Diagnosis not present

## 2017-04-18 MED ORDER — BACLOFEN 10 MG PO TABS: 10.0000 mg | ORAL_TABLET | Freq: Three times a day (TID) | ORAL | 0 refills | Status: DC | PRN

## 2017-04-18 MED ORDER — IPRATROPIUM BROMIDE 0.06 % NA SOLN: 2.0000 | Freq: Four times a day (QID) | NASAL | 0 refills | Status: DC

## 2017-04-18 MED ORDER — PREDNISONE 20 MG PO TABS: ORAL_TABLET | ORAL | 0 refills | Status: DC

## 2017-04-18 MED ORDER — BENZONATATE 100 MG PO CAPS: 100.0000 mg | ORAL_CAPSULE | Freq: Two times a day (BID) | ORAL | 0 refills | Status: AC | PRN

## 2017-04-18 NOTE — Patient Instructions (Addendum)
Erica Bartlett, Thank you for coming in to clinic today.  1. It sounds like you have a Upper Respiratory Virus - this will most likely run it's course in 7 to 10 days. Recommend good hand washing. - Start Atrovent nasal spray decongestant 2 sprays each nostril up to 4 times daily for 5-7 days - Continue anti-histamine Cetirizine 10mg  daily, also can use Flonase 2 sprays each nostril daily for up to 4-6 weeks - If congestion is worse, start OTC Mucinex (or may try Mucinex-DM for cough) up to 7-10 days then stop - START benzonatate 100 mg twice daily for cough.  - Drink plenty of fluids to improve congestion - You may try over the counter Nasal Saline spray (Simply Saline, Ocean Spray) as needed to reduce congestion. - Drink warm herbal tea with honey for sore throat. - Start taking Tylenol extra strength 1 to 2 tablets every 6-8 hours for aches or fever/chills for next few days as needed.  Do not take more than 3,000 mg in 24 hours from all medicines.  May take Ibuprofen as well if tolerated 200-400mg  every 8 hours as needed.  If symptoms significantly worsening with persistent fevers/chills despite tylenol/ibpurofen, nausea, vomiting unable to tolerate food/fluids or medicine, body aches, or shortness of breath, sinus pain pressure or worsening productive cough, then follow-up for re-evaluation, may seek more immediate care at Urgent Care or ED if more concerned for emergency.  2. Your back pain is likely from a muscle spasm.  You are also having some sciatica of the right leg which is from nerve irritation. - Start taking Tylenol extra strength 1 to 2 tablets every 6-8 hours for aches or fever/chills for next few days as needed.  Do not take more than 3,000 mg in 24 hours from all medicines.   - Use heat and ice.  Apply this for 15 minutes at a time 6-8 times per day.   - Muscle rub or pain patch over the counter with lidocaine.  Avoid using this with heat and ice to avoid burns. - START baclofen 10 mg  three times daily as needed for muscle spasm. - Self massage with a small ball can also help.  - To reduce pain and swelling: Take prednisone taper 20 mg tablets Day 1-4 (Today): Take 3 pills at one time Day 5-6: Take 2 pills  Day 7-8: Take 1 pills Day 9-10: Take 1/2 pill then stop.  Please schedule a follow-up appointment with Cassell Smiles, AGNP. Return  2 days for your cough if symptoms worsen or fail to improve and in 4-6 weeks for your back pain.  If you have any other questions or concerns, please feel free to call the clinic or send a message through [place]. You may also schedule an earlier appointment if necessary.  You will receive a survey after today's visit either digitally by e-mail or paper by [employer]. Your experiences and feedback matter to [place].  Please respond so we know how we are doing as we provide care for you.   Cassell Smiles, DNP, AGNP-BC Adult Gerontology Nurse Practitioner [facility], [facility]   Low Back Pain Exercises See other page with pictures of each exercise.  Start with 1 or 2 of these exercises that you are most comfortable with. Do not do any exercises that cause you significant worsening pain. Some of these may cause some "stretching soreness" but it should go away after you stop the exercise, and get better over time. Gradually increase up  to 3-4 exercises as tolerated.  Standing hamstring stretch: Place the heel of your leg on a stool about 15 inches high. Keep your knee straight. Lean forward, bending at the hips until you feel a mild stretch in the back of your thigh. Make sure you do not roll your shoulders and bend at the waist when doing this or you will stretch your lower back instead. Hold the stretch for 15 to 30 seconds. Repeat 3 times. Repeat the same stretch on your other leg.  Cat and camel: Get down on your hands and knees. Let your stomach sag, allowing your back to curve downward. Hold this position for 5 seconds. Then  arch your back and hold for 5 seconds. Do 3 sets of 10.  Quadriped Arm/Leg Raises: Get down on your hands and knees. Tighten your abdominal muscles to stiffen your spine. While keeping your abdominals tight, raise one arm and the opposite leg away from you. Hold this position for 5 seconds. Lower your arm and leg slowly and alternate sides. Do this 10 times on each side.  Pelvic tilt: Lie on your back with your knees bent and your feet flat on the floor. Tighten your abdominal muscles and push your lower back into the floor. Hold this position for 5 seconds, then relax. Do 3 sets of 10.  Partial curl: Lie on your back with your knees bent and your feet flat on the floor. Tighten your stomach muscles and flatten your back against the floor. Tuck your chin to your chest. With your hands stretched out in front of you, curl your upper body forward until your shoulders clear the floor. Hold this position for 3 seconds. Don't hold your breath. It helps to breathe out as you lift your shoulders up. Relax. Repeat 10 times. Build to 3 sets of 10. To challenge yourself, clasp your hands behind your head and keep your elbows out to the side.  Lower trunk rotation: Lie on your back with your knees bent and your feet flat on the floor. Tighten your abdominal muscles and push your lower back into the floor. Keeping your shoulders down flat, gently rotate your legs to one side, then the other as far as you can. Repeat 10 to 20 times.  Single knee to chest stretch: Lie on your back with your legs straight out in front of you. Bring one knee up to your chest and grasp the back of your thigh. Pull your knee toward your chest, stretching your buttock muscle. Hold this position for 15 to 30 seconds and return to the starting position. Repeat 3 times on each side.  Double knee to chest: Lie on your back with your knees bent and your feet flat on the floor. Tighten your abdominal muscles and push your lower back into the  floor. Pull both knees up to your chest. Hold for 5 seconds and repeat 10 to 20 times.

## 2017-04-18 NOTE — Progress Notes (Signed)
Subjective:    Patient ID: Erica Bartlett, female    DOB: May 15, 1955, 61 y.o.   MRN: [id number]  Erica Bartlett is a 61 y.o. female presenting on 04/18/2017 for Cough (for 4 days) and Back Pain (after sneezing started having back pain)   HPI URI symptoms Pt reports productive cough w/ yellow phlegm, post nasal drip, cough, maxillary sinus pressure, nasal congestion, some rhinorrhea with symptom onset about 6 days ago. - Has had night sweats for last 2 nights w/ drenching sweat.  Is going through menopause, but not usually night sweats of this severity.  Pt denies any other fever, chills, nausea, vomiting, diarrhea and constipation. - Has been taking tylenol for back pain which has helped fever/sweats. - Is not currently taking her cetirizine, but usually does through spring-fall for recurrent URI/sinusitis.  Low Back Pain Had "powerful sneeze" with immediate onset of symptoms 4 days ago.  She states, " I immediately went down on one knee and had to have help getting up." - Does lift wood for fireplace regularly, but had no pain from that activity over the last month. - Pt also reports worse pain without lumbar support and is also associated with left-sided sciatica occasionally. - Tylenol is not currently helping the pain and is only conservative treatment she is currently using.  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  - Pt's niece is coming to see her today who is an athletic/sports med Community education officer.  She has stated she would get help for her pain from her as well.   Social History   Tobacco Use  . Smoking status: Current Some Day Smoker    Packs/day: 0.15    Types: Cigarettes  . Smokeless tobacco: Former Network engineer Use Topics  . Alcohol use: No  . Drug use: No    Review of Systems Per HPI unless specifically indicated above     Objective:    BP 110/62 (BP Location: Left Arm, Patient Position: Sitting, Cuff Size: Normal)   Pulse 66   Temp 98.4  F (36.9 C) (Oral)   Resp 15   Ht 5' 3.5" (1.613 m)   Wt 173 lb 12.8 oz (78.8 kg)   SpO2 100%   BMI 30.30 kg/m   Wt Readings from Last 3 Encounters:  04/18/17 173 lb 12.8 oz (78.8 kg)  01/23/17 182 lb 9.6 oz (82.8 kg)  12/21/16 184 lb 4.8 oz (83.6 kg)    Physical Exam  Constitutional: She appears well-developed and well-nourished. She appears distressed (mildly).  HENT:  Head: Normocephalic and atraumatic.  Right Ear: Hearing, tympanic membrane, external ear and ear canal normal.  Left Ear: Hearing, tympanic membrane, external ear and ear canal normal.  Nose: Mucosal edema and rhinorrhea present. Right sinus exhibits maxillary sinus tenderness and frontal sinus tenderness. Left sinus exhibits maxillary sinus tenderness and frontal sinus tenderness.  Mouth/Throat: Uvula is midline and mucous membranes are normal. Oropharyngeal exudate (clear secretions) and posterior oropharyngeal edema (cobblestoning) present.  Eyes: Conjunctivae are normal. Pupils are equal, round, and reactive to light.  Neck: Normal range of motion. Neck supple.  Cardiovascular: Normal rate, regular rhythm, S1 normal, S2 normal and normal heart sounds.  Pulmonary/Chest: Effort normal and breath sounds normal. No respiratory distress.  Musculoskeletal:  Low Back Inspection: Normal appearance, no spinal deformity, symmetrical. Palpation: No tenderness over spinous processes. Bilateral lumbar paraspinal muscles tender and with hypertonicity/spasm from level of T12-S1 ROM: Limited active ROM forward flex / back extension, rotation L/R  with discomfort Special Testing: Seated SLR positive for radicular pain right side and reproduced bilateral back pain Strength: Bilateral hip flex/ext 5/5, knee flex/ext 5/5, ankle dorsiflex/plantarflex 5/5 Neurovascular: intact distal sensation to light touch  Lymphadenopathy:    She has no cervical adenopathy.  Neurological: She is alert.  Skin: Skin is warm and dry.  Psychiatric:  She has a normal mood and affect. Her behavior is normal.  Vitals reviewed.  Results for orders placed or performed in visit on 03/26/17  CBC with Differential/Platelet  Result Value Ref Range   WBC 5.8 3.6 - 11.0 K/uL   RBC 5.09 3.80 - 5.20 MIL/uL   Hemoglobin 14.7 12.0 - 16.0 g/dL   HCT 43.4 35.0 - 47.0 %   MCV 85.4 80.0 - 100.0 fL   MCH 28.9 26.0 - 34.0 pg   MCHC 33.9 32.0 - 36.0 g/dL   RDW 15.2 (H) 11.5 - 14.5 %   Platelets 159 150 - 440 K/uL   Neutrophils Relative % 69 %   Neutro Abs 4.0 1.4 - 6.5 K/uL   Lymphocytes Relative 24 %   Lymphs Abs 1.4 1.0 - 3.6 K/uL   Monocytes Relative 5 %   Monocytes Absolute 0.3 0.2 - 0.9 K/uL   Eosinophils Relative 1 %   Eosinophils Absolute 0.1 0 - 0.7 K/uL   Basophils Relative 1 %   Basophils Absolute 0.1 0 - 0.1 K/uL      Assessment & Plan:   Problem List Items Addressed This Visit    None    Visit Diagnoses    Acute bilateral low back pain with right-sided sciatica    -  Primary Pain likely self-limited.  Muscle strain possible complicated by coughing and sneezing 2/2 URI.  Plan:  1. Treat with acetaminophen.  Discussed alternate dosing and max dosing. - Pt unable to take NSAIDs 2/2 ITP.  Start prednisone taper over 10 days. Take prednisone taper 20 mg tablets Day 1-4 take 3 pills at one time; Day 5-6: Take 2 pills; Day 7-8: Take 1 pills; Day 9-10: Take 1/2 pill; then stop. 2. Apply heat and/or ice to affected area. 3. May also apply a muscle rub with lidocaine after heat or ice. 4. Take muscle relaxer baclofen 10 mg up to three times daily.  Cautioned drowsiness. 5. Follow up 2-4 weeks as needed    Relevant Medications   baclofen (LIORESAL) 10 MG tablet   predniSONE (DELTASONE) 20 MG tablet   Acute rhinosinusitis       Acute non-recurrent maxillary sinusitis     Acute illness. Fever responsive to NSAIDs and tylenol.  Symptoms not worsening. Consistent with viral illness x 6 days with no known sick contacts and no  identifiable focal infections of ears, nose, throat.   - Pt called clinic on 12/27 with persistent symptoms and request for antibiotic.  Diagnosis changed to sinusitis.  Plan: 1. START amoxicillin 500 mg bid x 10 days - cough lasting up to few weeks - Start Atrovent nasal spray decongestant 2 sprays each nostril up to 4 times daily for 5-7 days - Continue anti-histamine Loratadine 10mg  daily,  - also can use Flonase 2 sprays each nostril daily for up to 4-6 weeks - Start Mucinex-DM OTC up to 7-10 days then stop - Start tessalon perles 100 mg every 12 hours as needed for cough. 2. Supportive care with nasal saline, warm herbal tea with honey, 3. Improve hydration 4. Tylenol / Motrin PRN fevers 5. Return criteria given  Relevant Medications   benzonatate (TESSALON) 100 MG capsule   predniSONE (DELTASONE) 20 MG tablet   ipratropium (ATROVENT) 0.06 % nasal spray      Interval: Pt called 04/19/17 with complaints of worsening symptoms.  Requests additional treatment.  Will send amoxicillin for treatment of sinusitis.   Meds ordered this encounter  Medications  . benzonatate (TESSALON) 100 MG capsule    Sig: Take 1 capsule (100 mg total) by mouth 2 (two) times daily as needed for up to 10 days for cough.    Dispense:  20 capsule    Refill:  0    Order Specific Question:   Supervising Provider    Answer:   Olin Hauser [[id number]]  . baclofen (LIORESAL) 10 MG tablet    Sig: Take 1 tablet (10 mg total) by mouth 3 (three) times daily as needed for up to 10 days for muscle spasms.    Dispense:  30 each    Refill:  0    Order Specific Question:   Supervising Provider    Answer:   Olin Hauser [[id number]]  . predniSONE (DELTASONE) 20 MG tablet    Sig: Day 1-4 take 3 pills once daily.  Day 5-6 take 2 pills.  Day 7-8 take 1 pill.  Day 9-10 take 1/2 pill.    Dispense:  19 tablet    Refill:  0    Order Specific Question:   Supervising Provider    Answer:   Olin Hauser [[id number]]  . ipratropium (ATROVENT) 0.06 % nasal spray    Sig: Place 2 sprays into both nostrils 4 (four) times daily. For up to 5-7 days then stop.    Dispense:  15 mL    Refill:  0    Order Specific Question:   Supervising Provider    Answer:   Olin Hauser [[id number]]      Follow up plan: Return  2 days for your cough if symptoms worsen or fail to improve and in 4-6 weeks for your back pain.   Cassell Smiles, DNP, AGPCNP-BC Adult Gerontology Primary Care Nurse Practitioner [facility] 04/18/2017, 9:45 AM

## 2017-04-19 MED ORDER — AMOXICILLIN 500 MG PO TABS: 500.0000 mg | ORAL_TABLET | Freq: Two times a day (BID) | ORAL | 0 refills | Status: AC

## 2017-04-23 ENCOUNTER — Other Ambulatory Visit: Payer: Self-pay

## 2017-04-23 DIAGNOSIS — M5441 Lumbago with sciatica, right side: Secondary | ICD-10-CM

## 2017-04-23 MED ORDER — BACLOFEN 10 MG PO TABS: 10.0000 mg | ORAL_TABLET | Freq: Three times a day (TID) | ORAL | 5 refills | Status: AC | PRN

## 2017-04-25 ENCOUNTER — Other Ambulatory Visit: Payer: Self-pay

## 2017-05-09 ENCOUNTER — Other Ambulatory Visit: Payer: Self-pay

## 2017-05-16 ENCOUNTER — Telehealth: Payer: Self-pay | Admitting: Oncology

## 2017-05-16 NOTE — Telephone Encounter (Signed)
Lab, see MD in the next 1-2 weeks.  Thanks.

## 2017-05-16 NOTE — Telephone Encounter (Signed)
I have scheduled pt to come see Woodfin Ganja next week  Lab\[place]. BF

## 2017-05-16 NOTE — Telephone Encounter (Signed)
Patient cancelled her 12/3 appointment to see doctor though she had her labs drawn that day. She had said she would call back to reschedule, but never did. Please advise what to do about her now calling that she does not feel good and wants labs

## 2017-05-20 NOTE — Progress Notes (Signed)
[facility]  [phone] [phone]  ID: Erica Bartlett OB: 09-04-1955  MR#: [id number]  KOE#:[id number]  Patient Care Team: Mikey College, NP as PCP - General (Nurse Practitioner) Marry Guan Laurice Record, MD (Orthopedic Surgery)  CHIEF COMPLAINT: ITP  INTERVAL HISTORY: Patient returns to clinic today for repeat laboratory work and further evaluation.  She has noted increased weakness and fatigue over the past several weeks, but attributes this to taking care of her sick father.  She otherwise feels well and is asymptomatic. She continues to have chronic back pain and difficulty sleeping. She denies any recent fevers or illnesses. She has no neurologic complaints. She denies any chest pain or shortness of breath. She has no nausea, vomiting, constipation, or diarrhea. She has no urinary complaints. Patient offers no further specific complaints today.  REVIEW OF SYSTEMS:    Review of Systems  Constitutional: Positive for malaise/fatigue. Negative for fever and weight loss.  Respiratory: Negative for cough and shortness of breath.   Cardiovascular: Negative.  Negative for chest pain and leg swelling.  Gastrointestinal: Negative.  Negative for blood in stool, melena and nausea.  Genitourinary: Negative.  Negative for hematuria.  Musculoskeletal: Positive for back pain. Negative for joint pain.  Skin: Negative.  Negative for rash.  Neurological: Positive for weakness. Negative for sensory change.  Endo/Heme/Allergies: Does not bruise/bleed easily.  Psychiatric/Behavioral: The patient is nervous/anxious and has insomnia.     As per HPI. Otherwise, a complete review of systems is negative.  PAST MEDICAL HISTORY: Past Medical History:  Diagnosis Date  . Hypertension     PAST SURGICAL HISTORY: Past Surgical History:  Procedure Laterality Date  . ABDOMINAL HYSTERECTOMY    . BREAST BIOPSY Left    age 81 surgical bx neg  . CESAREAN SECTION     . cyst removed     breast, benign    FAMILY HISTORY: Reviewed and unchanged. No reported history of malignancy or chronic disease.     ADVANCED DIRECTIVES:    HEALTH MAINTENANCE: Social History   Tobacco Use  . Smoking status: Current Some Day Smoker    Packs/day: 0.15    Types: Cigarettes  . Smokeless tobacco: Former Network engineer Use Topics  . Alcohol use: No  . Drug use: No    Allergies  Allergen Reactions  . Doxycycline Nausea And Vomiting    Current Outpatient Medications  Medication Sig Dispense Refill  . acetaminophen (TYLENOL) 500 MG tablet Take 500 mg by mouth every 6 (six) hours as needed.    Marland Kitchen amLODipine (NORVASC) 10 MG tablet Take 1 tablet (10 mg total) by mouth daily. 30 tablet 12  . cetirizine (ZYRTEC) 10 MG tablet Take 1 tablet (10 mg total) by mouth daily. 30 tablet 11  . clonazePAM (KLONOPIN) 1 MG tablet Take 1-2 mg by mouth 3 (three) times daily as needed. Reported on 10/11/2015    . fluticasone (FLONASE) 50 MCG/ACT nasal spray Place 2 sprays into both nostrils daily. 16 g 11  . guaiFENesin (ROBITUSSIN) 100 MG/5ML liquid Take 10 mLs (200 mg total) by mouth 3 (three) times daily as needed for cough. 120 mL 0  . hydrochlorothiazide (HYDRODIURIL) 25 MG tablet Take 1 tablet (25 mg total) by mouth daily. 90 tablet 2  . ipratropium (ATROVENT) 0.06 % nasal spray Place 2 sprays into both nostrils 4 (four) times daily. For up to 5-7 days then stop. 15 mL 0  . ondansetron (ZOFRAN) 4 MG tablet Take 1 tablet (4  mg total) by mouth every 8 (eight) hours as needed for nausea or vomiting. 20 tablet 0  . predniSONE (DELTASONE) 20 MG tablet Day 1-4 take 3 pills once daily.  Day 5-6 take 2 pills.  Day 7-8 take 1 pill.  Day 9-10 take 1/2 pill. 19 tablet 0  . VENTOLIN HFA 108 (90 Base) MCG/ACT inhaler INHALE 2 PUFFS INTO THE LUNGS EVERY 4 HOURS AS NEEDED FOR WHEEZING OR SHORTNESS OF BREATH 18 g 1  . gabapentin (NEURONTIN) 100 MG capsule Take 1 capsule (100 mg total) by mouth  3 (three) times daily. Or 2 capsules (200 mg) at night. 42 capsule 0   No current facility-administered medications for this visit.     OBJECTIVE: Vitals:   05/22/17 1027  BP: 132/83  Pulse: 72  Resp: 18  Temp: (!) 97.4 F (36.3 C)     Body mass index is 30.32 kg/m.    ECOG FS:0 - Asymptomatic  General: Well-developed, well-nourished, no acute distress. Eyes: Pink conjunctiva, anicteric sclera. Lungs: Clear to auscultation bilaterally. Heart: Regular rate and rhythm. No rubs, murmurs, or gallops. Abdomen: Soft, nontender, nondistended. No organomegaly noted, normoactive bowel sounds. Musculoskeletal: No edema, cyanosis, or clubbing. Neuro: Alert, answering all questions appropriately. Cranial nerves grossly intact. Skin: No rashes or petechiae noted. Psych: Normal affect.   LAB RESULTS:  Lab Results  Component Value Date   NA 138 08/17/2016   K 3.6 08/17/2016   CL 101 08/17/2016   CO2 25 08/17/2016   GLUCOSE 81 08/17/2016   BUN 11 08/17/2016   CREATININE 0.88 08/17/2016   CALCIUM 9.4 08/17/2016   PROT 7.3 08/17/2016   ALBUMIN 4.0 08/17/2016   AST 25 08/17/2016   ALT 30 (H) 08/17/2016   ALKPHOS 85 08/17/2016   BILITOT 0.3 08/17/2016   GFRNONAA 64 08/04/2015   GFRAA 74 08/04/2015    Lab Results  Component Value Date   WBC 5.8 05/22/2017   NEUTROABS 3.6 05/22/2017   HGB 13.9 05/22/2017   HCT 41.2 05/22/2017   MCV 85.3 05/22/2017   PLT 166 05/22/2017     STUDIES: No results found.  ASSESSMENT: Bone marrow biopsy proven ITP.  PLAN:    1. ITP: Bone marrow biopsy performed on July 28, 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 Patient responded to steroids, but the results were not durable. She completed her first round of weekly Rituxan on Aug 30, 2015. This was durable for approximately one year and recently completed 4 weekly cycles of Rituxan on November 23, 2016. Of note, she will require additional premedications in the future  since she had a mild reaction with cycle 1. Patient platelet count has significantly improved and is now within normal limits. No intervention is needed at this time. Return to clinic in 3 months for laboratory work only and then in 6 months for laboratory work and further evaluation.   2. Back pain: Continue evaluation and treatment per primary care. 3. Insomnia: Possibly secondary to back pain. Continue follow-up with primary care as above.   Approximately 20 minutes was spent in discussion of which greater than 50% was consultation.  Patient expressed understanding and was in agreement with this plan. She also understands that She can call clinic at any time with any questions, concerns, or complaints.   Lloyd Huger, MD   05/22/2017 10:47 AM

## 2017-05-22 ENCOUNTER — Inpatient Hospital Stay: Payer: Medicare HMO | Attending: Oncology | Admitting: Oncology

## 2017-05-22 ENCOUNTER — Inpatient Hospital Stay: Payer: Medicare HMO

## 2017-05-22 VITALS — BP 132/83 | HR 72 | Temp 97.4°F | Resp 18 | Wt 173.9 lb

## 2017-05-22 DIAGNOSIS — Z79899 Other long term (current) drug therapy: Secondary | ICD-10-CM | POA: Diagnosis not present

## 2017-05-22 DIAGNOSIS — M549 Dorsalgia, unspecified: Secondary | ICD-10-CM

## 2017-05-22 DIAGNOSIS — R69 Illness, unspecified: Secondary | ICD-10-CM | POA: Diagnosis not present

## 2017-05-22 DIAGNOSIS — G4700 Insomnia, unspecified: Secondary | ICD-10-CM | POA: Diagnosis not present

## 2017-05-22 DIAGNOSIS — I10 Essential (primary) hypertension: Secondary | ICD-10-CM | POA: Insufficient documentation

## 2017-05-22 DIAGNOSIS — G8929 Other chronic pain: Secondary | ICD-10-CM | POA: Insufficient documentation

## 2017-05-22 DIAGNOSIS — F17210 Nicotine dependence, cigarettes, uncomplicated: Secondary | ICD-10-CM | POA: Diagnosis not present

## 2017-05-22 DIAGNOSIS — D693 Immune thrombocytopenic purpura: Secondary | ICD-10-CM

## 2017-05-22 LAB — CBC WITH DIFFERENTIAL/PLATELET
BASOS ABS: 0.1 10*3/uL (ref 0–0.1)
Basophils Relative: 1 %
EOS ABS: 0.2 10*3/uL (ref 0–0.7)
EOS PCT: 4 %
HCT: 41.2 % (ref 35.0–47.0)
Hemoglobin: 13.9 g/dL (ref 12.0–16.0)
Lymphocytes Relative: 27 %
Lymphs Abs: 1.6 10*3/uL (ref 1.0–3.6)
MCH: 28.8 pg (ref 26.0–34.0)
MCHC: 33.7 g/dL (ref 32.0–36.0)
MCV: 85.3 fL (ref 80.0–100.0)
Monocytes Absolute: 0.3 10*3/uL (ref 0.2–0.9)
Monocytes Relative: 6 %
Neutro Abs: 3.6 10*3/uL (ref 1.4–6.5)
Neutrophils Relative %: 62 %
PLATELETS: 166 10*3/uL (ref 150–440)
RBC: 4.83 MIL/uL (ref 3.80–5.20)
RDW: 15.4 % — ABNORMAL HIGH (ref 11.5–14.5)
WBC: 5.8 10*3/uL (ref 3.6–11.0)

## 2017-07-02 ENCOUNTER — Other Ambulatory Visit: Payer: Self-pay | Admitting: Nurse Practitioner

## 2017-07-02 ENCOUNTER — Telehealth: Payer: Self-pay | Admitting: Family Medicine

## 2017-07-02 DIAGNOSIS — J40 Bronchitis, not specified as acute or chronic: Secondary | ICD-10-CM

## 2017-07-02 IMAGING — CR DG SHOULDER 2+V*R*
1 series · 3 of 3 positions shown · non-contrast
Comparison: 09/18/2014

CLINICAL DATA: RIGHT shoulder pain for 1 week, pain increased for 3
days, increased pain when lifting arm, pain at lateral and pop
portions of shoulder, no known injury

EXAM:
RIGHT SHOULDER - 2+ VIEW

[Series 1: dg shoulder right · 0.14mm/px · 3 of 3 slices shown]
[im 1/3]
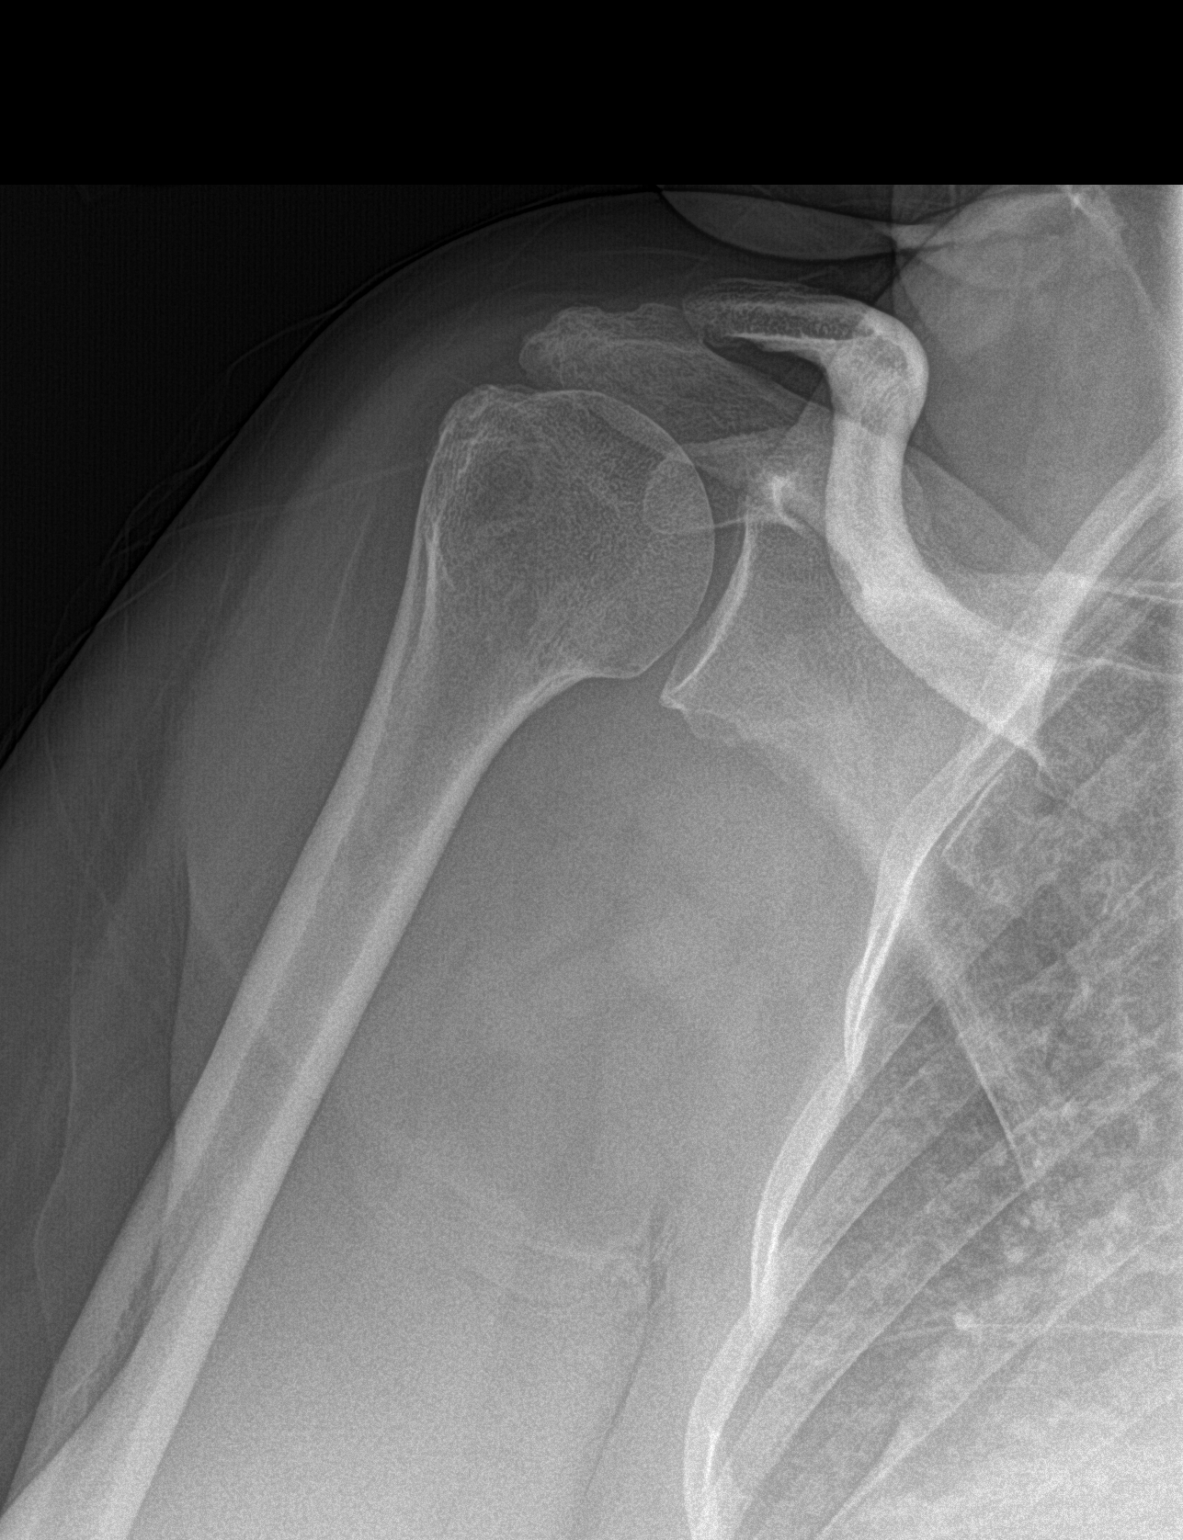
[im 2/3]
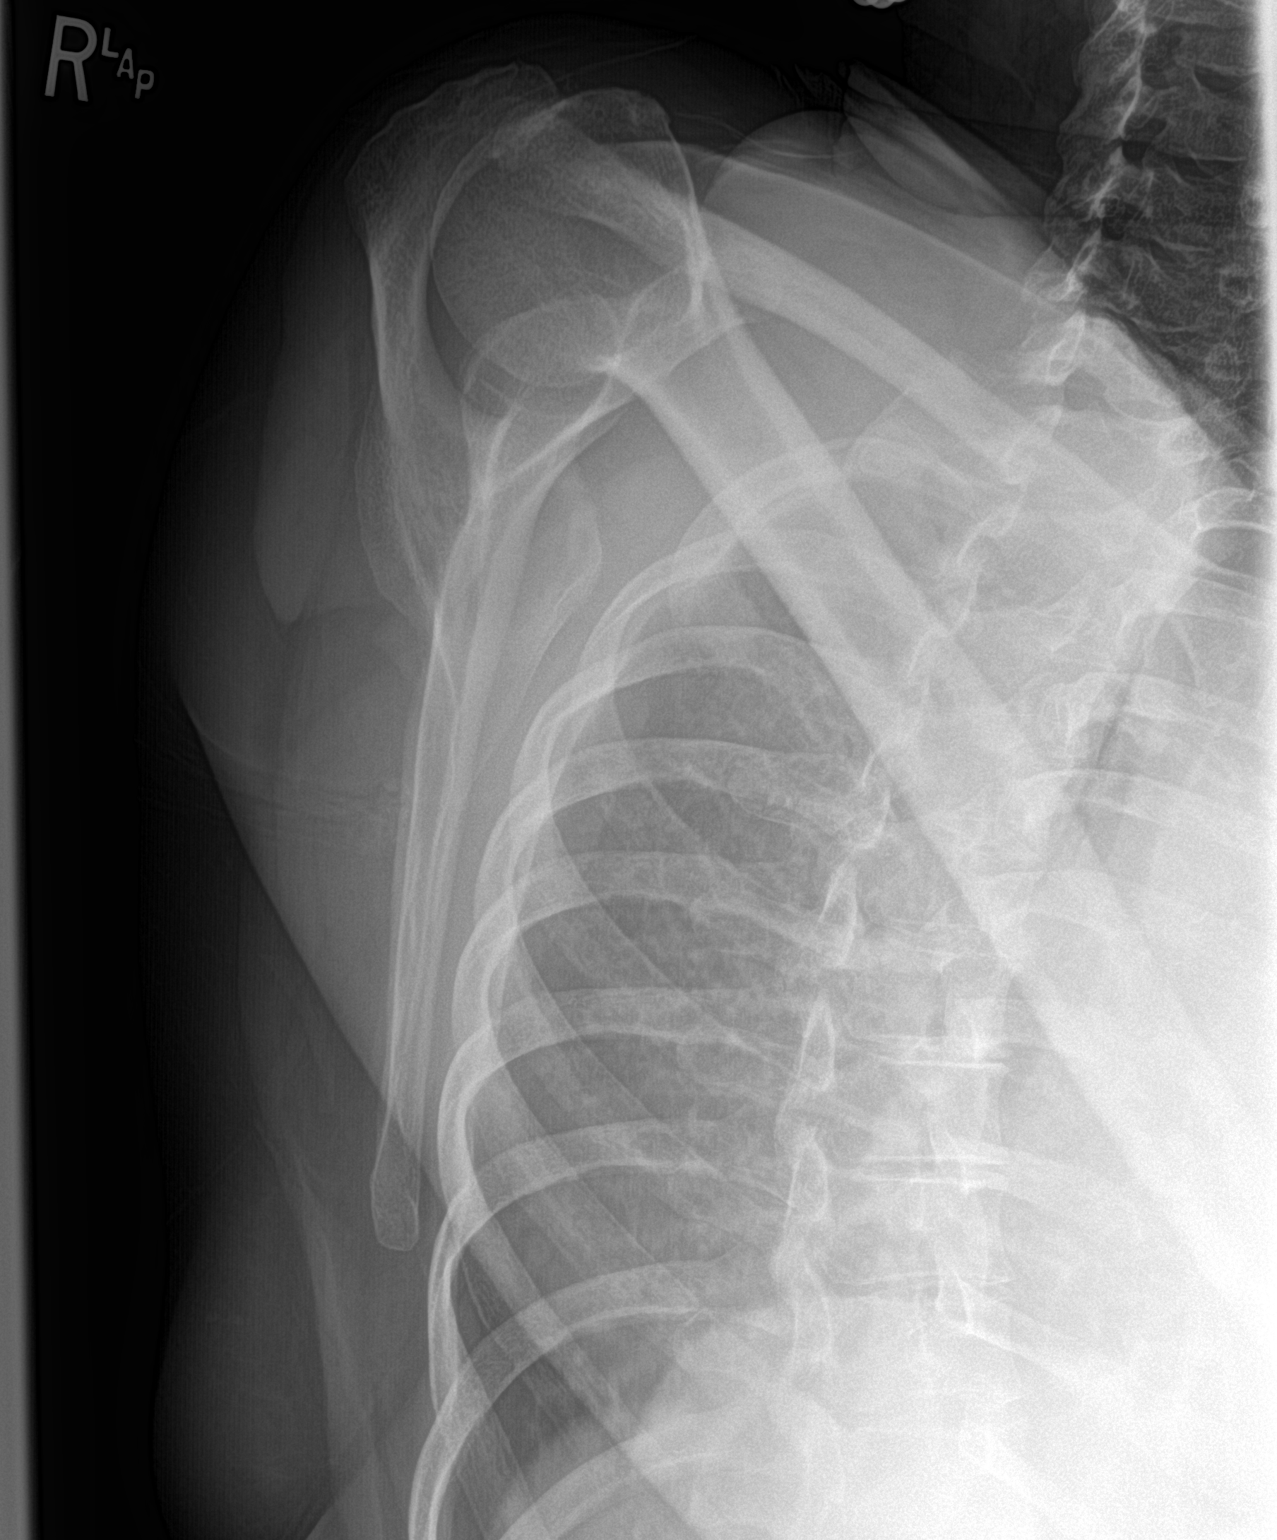
[im 3/3]
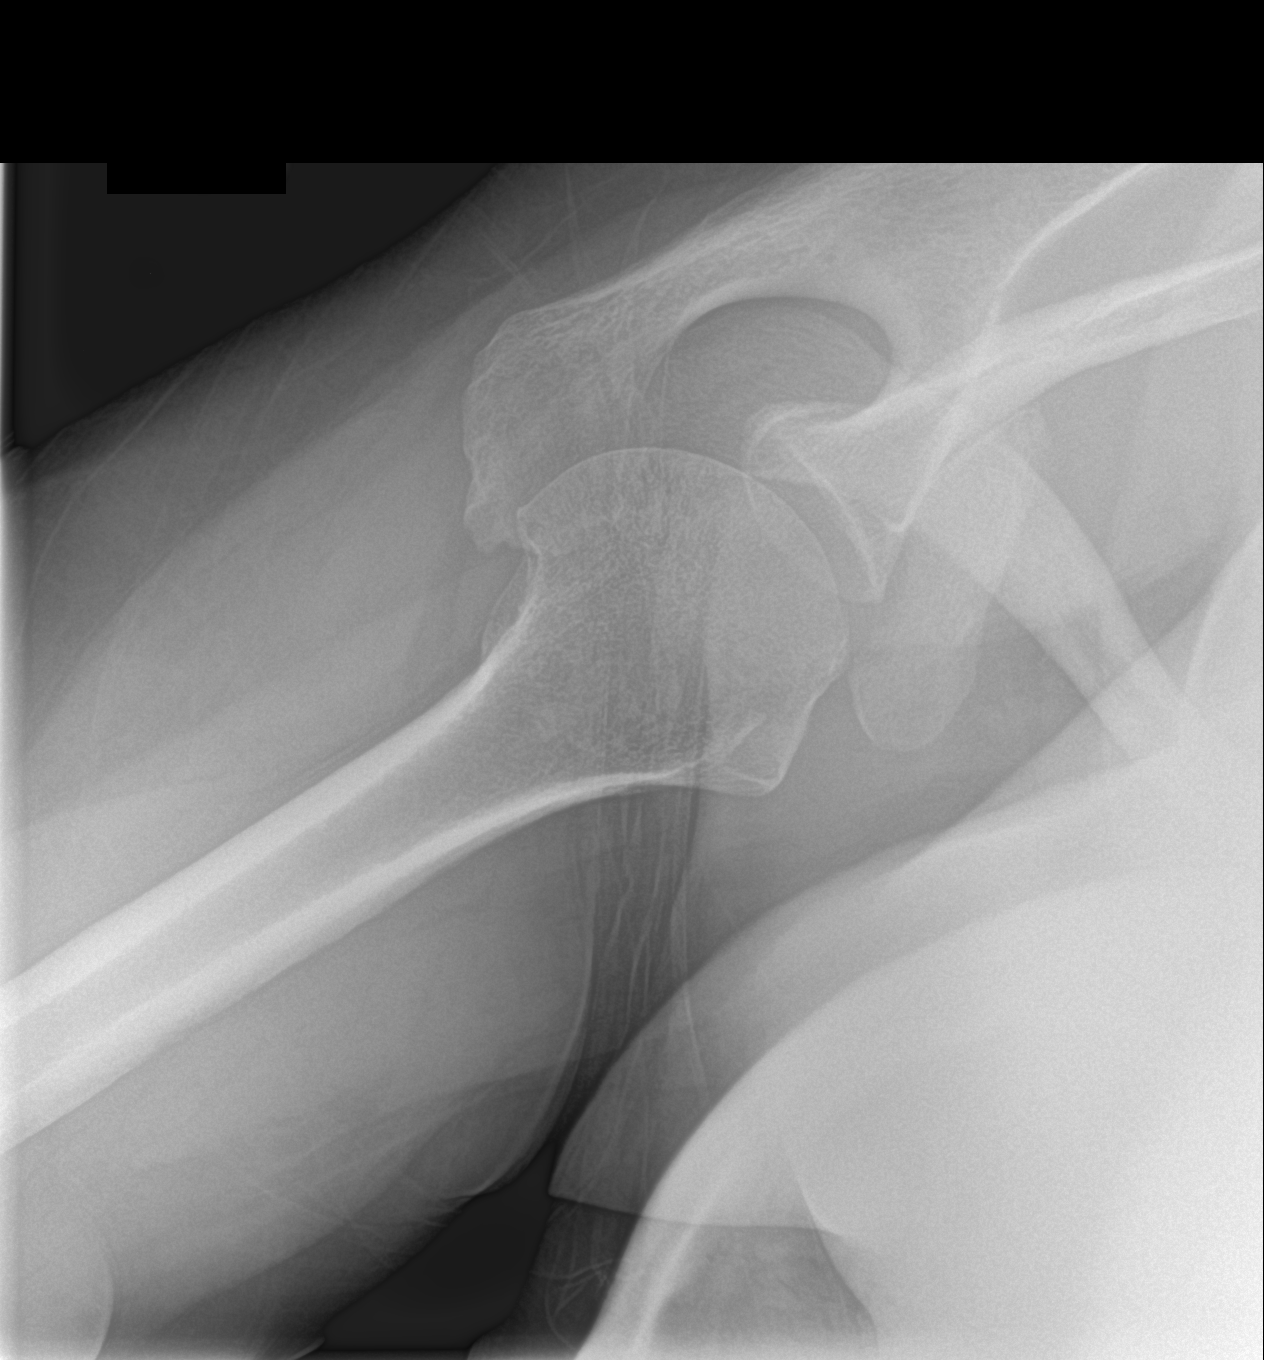

[3 of 3 positions shown; findings below may reference images not displayed]

FINDINGS: Osseous demineralization.

AC joint alignment normal.

No acute fracture, dislocation, or bone destruction.

Visualized RIGHT ribs intact.
IMPRESSION: No acute abnormalities.

## 2017-07-02 MED ORDER — ALBUTEROL SULFATE HFA 108 (90 BASE) MCG/ACT IN AERS
INHALATION_SPRAY | RESPIRATORY_TRACT | 3 refills | Status: DC
Start: 2017-07-02 — End: 2017-07-25

## 2017-07-02 NOTE — Progress Notes (Signed)
Call from [facility] Call center recorded.  Pt called in followup for wheezing complaint.  Pt reports simply needs refill of albuterol for chronic needs of wheezing and cough.  No worsening of symptoms or new acute illness.  Refill provided.

## 2017-07-02 NOTE — Telephone Encounter (Signed)
Pt needs a refill on ventolin sent to [facility] in [facility].  Her number is [phone]

## 2017-07-05 DIAGNOSIS — H524 Presbyopia: Secondary | ICD-10-CM | POA: Diagnosis not present

## 2017-07-20 ENCOUNTER — Encounter: Payer: Self-pay | Admitting: Family Medicine

## 2017-07-20 ENCOUNTER — Telehealth: Payer: Self-pay | Admitting: Family Medicine

## 2017-07-20 ENCOUNTER — Ambulatory Visit (INDEPENDENT_AMBULATORY_CARE_PROVIDER_SITE_OTHER): Payer: Medicare HMO | Admitting: Family Medicine

## 2017-07-20 VITALS — BP 117/79 | HR 68 | Temp 98.3°F | Resp 16 | Ht 63.0 in | Wt 169.0 lb

## 2017-07-20 DIAGNOSIS — R509 Fever, unspecified: Secondary | ICD-10-CM | POA: Diagnosis not present

## 2017-07-20 DIAGNOSIS — J111 Influenza due to unidentified influenza virus with other respiratory manifestations: Secondary | ICD-10-CM

## 2017-07-20 LAB — POCT INFLUENZA A/B
INFLUENZA A, POC: NEGATIVE
INFLUENZA B, POC: NEGATIVE

## 2017-07-20 MED ORDER — BALOXAVIR MARBOXIL(40 MG DOSE) 2 X 20 MG PO TBPK: 40.0000 mg | ORAL_TABLET | Freq: Once | ORAL | 0 refills | Status: DC

## 2017-07-20 NOTE — Patient Instructions (Addendum)
Thank you for coming to the office today.  Your flu test was NEGATIVE, this is not 100% though, and you can still have the flu with a negative test, otherwise it could be a different viral syndrome.  Start Xofluza 2 tablets at once then you are done with treatment for flu and virus.  Not consistent with bacterial infection today - but if does not improve by Monday or Tuesday can call us back and we can switch treatment.  - Wash hands and cover cough very well to avoid spread of infection - For symptom control:      - Take Ibuprofen / Advil 400-600mg  every 6-8 hours as needed for fever / muscle aches, and may also take Tylenol 500-1000mg  per dose every 6-8 hours or 3 times a day, can alternate dosing      - Start Atrovent nasal spray decongestant 2 sprays in each nostril up to 4 times daily for 7 days      - Start OTC Mucinex for cough and congestion for up to 7 days - Improve hydration with plenty of clear fluids  If significant worsening with poor fluid intake, worsening fever, difficulty breathing due to coughing, worsening body aches, weakness, or other more concerning symptoms difficulty breathing you can seek treatment at Emergency Department. Also if improved flu symptoms and then worsening days to week later with concerns for bronchitis, productive cough fever chills again we may need to check for possible pneumonia that can occur after the flu  Please schedule a Follow-up Appointment to: Return in about 1 week (around 07/27/2017), or if symptoms worsen or fail to improve, for flu sinusitis.  If you have any other questions or concerns, please feel free to call the office or send a message through [place]. You may also schedule an earlier appointment if necessary.  Additionally, you may be receiving a survey about your experience at our office within a few days to 1 week by e-mail or mail. We value your feedback.  [name], DO [place]

## 2017-07-20 NOTE — Telephone Encounter (Signed)
Patient called [place] earlier today after her visit this morning and reports [facility] not [facility] the [place], they could order it by Monday or Tuesday. She is asking if different medicine can be called in. She is requesting antibiotic and now does not think she has the flu. I advised her that I still have my concerns that this could be flu, especially with her close contact at home. I offered Tamiflu but she declines. She thinks now the cough is more deeper chest congestion. We agreed that she may proceed with the Mucinex as advised and given sample, to help clear her chest congestion and increase fluids. If not improved by Monday she may call us back and we will consider starting an antibiotic for her either Azithromycin or Levaquin for her concern, given her chronic history of recurrent Sinusitis leading to lower respiratory infections. 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  She agrees to plan, and understands if worsening over weekend may go to urgent care or ED if more emergent concern.  [name], [facility] 07/20/2017, 5:14 PM

## 2017-07-20 NOTE — Progress Notes (Signed)
Subjective:    Patient ID: Erica Bartlett, female    DOB: 11/23/1955, 62 y.o.   MRN: [id number]  Erica Bartlett is a 62 y.o. female presenting on 07/20/2017 for Cough (fever chills cough HA sore throat onset 4 days getting worst)  Patient presents for a same day appointment. Note patient scheduled apt 10 min before scheduled time, and arrived about 10 min after apt time was scheduled for.  HPI   INFLUENZA-LIKE VIRAL SYNDROME vs Sinusitis Known chronic history of recurrent sinusitis infections, followed by ENT, has had multiple courses antibiotics in past, most recent 01/2017 and 03/2017. No prior recent infections or antibiotics None within past 30 days. - Now new acute symptoms 4 days ago, with worsening symptoms with thicker productive cough and sore throat, having thicker sinus congestion draining down into throat and into chest with congestion. Tried Theraflu and Delsym for cough. - Sick contact with positive Influenza contact at home, and other child at home has bronchitis - Admits fevers and chills with shaking, with some body aches - Admits nausea without vomiting - Denies diarrhea abdominal pain, dyspnea wheezing, chest pain, sweats  Health Maintenance: Did not get Flu Vaccine this season.  Depression screen [facility] 2/9 08/17/2016 05/26/2016 10/28/2015  Decreased Interest 1 1 3   Down, Depressed, Hopeless 0 0 1  PHQ - 2 Score 1 1 4   Altered sleeping 3 - 3  Tired, decreased energy 3 - 2  Change in appetite 3 - 2  Feeling bad or failure about yourself  0 - 0  Trouble concentrating 1 - 2  Moving slowly or fidgety/restless 0 - 1  Suicidal thoughts 0 - 0  PHQ-9 Score 11 - 14  Difficult doing work/chores Not difficult at all - Somewhat difficult    Social History   Tobacco Use  . Smoking status: Current Some Day Smoker    Packs/day: 0.15    Types: Cigarettes  . Smokeless tobacco: Former Network engineer Use Topics  . Alcohol use: No  . Drug use: No    Review of  Systems Per HPI unless specifically indicated above     Objective:    BP 117/79   Pulse 68   Temp 98.3 F (36.8 C)   Resp 16   Ht 5\' 3"  (1.6 m)   Wt 169 lb (76.7 kg)   SpO2 100%   BMI 29.94 kg/m   Wt Readings from Last 3 Encounters:  07/20/17 169 lb (76.7 kg)  05/22/17 173 lb 14.4 oz (78.9 kg)  04/18/17 173 lb 12.8 oz (78.8 kg)    Physical Exam  Constitutional: She is oriented to person, place, and time. She appears well-developed and well-nourished. No distress.  Mildly ill and sick appearing, uncomfortable d/t congestion and cough, cooperative  HENT:  Head: Normocephalic and atraumatic.  Mouth/Throat: Oropharynx is clear and moist.  Frontal / maxillary sinuses minimal tender. Nares with turbinate edema and congestion without purulence. Bilateral TMs clear with mild effusion without erythema or bulging. Oropharynx mostly clear some post nasal drainage with non specific erythema without exudates or asymmetry.  Eyes: Conjunctivae are normal. Right eye exhibits no discharge. Left eye exhibits no discharge.  Neck: Normal range of motion. Neck supple.  Cardiovascular: Normal rate, regular rhythm, normal heart sounds and intact distal pulses.  No murmur heard. Pulmonary/Chest: Effort normal and breath sounds normal. No respiratory distress. She has no wheezes. She has no rales.  Good air movement. Occasional transmitted upper airway sound with some scattered rhonchi  clear with cough. Otherwise no focal crackles or wheezing. Frequent cough.  Musculoskeletal: She exhibits no edema.  Lymphadenopathy:    She has no cervical adenopathy.  Neurological: She is alert and oriented to person, place, and time.  Skin: Skin is warm and dry. No rash noted. She is not diaphoretic. No erythema.  Psychiatric: Her behavior is normal.  Nursing note and vitals reviewed.  Results for orders placed or performed in visit on 07/20/17  POCT Influenza A/B  Result Value Ref Range   Influenza A, POC  Negative Negative   Influenza B, POC Negative Negative      Assessment & Plan:   Problem List Items Addressed This Visit    None    Visit Diagnoses    Influenza    -  Primary   Relevant Medications   Baloxavir Marboxil 40 MG Dose (XOFLUZA) 20 (2) MG TBPK   Fever and chills       Relevant Orders   POCT Influenza A/B (Completed)      Clinically diagnosed influenza despite negative rapid flu test today, concern for flu still due to significant known exposure to positive influenza at home with fam member - Duration x 3-4 days, without complication. Tolerating PO and well hydrated - No other focal findings of infection today. Some concern for sinuses however she has similar episodes in past, but this seems different with more generalized symptoms myalgia, chills/fever - Did not receive influenza vaccine this season  Plan: 1. Start Xofluza 40mg  (x 2 of the 20mg  tabs, wt based dosing) x 1 dose - sent rx to pharmacy, given manufacturer coupon discount and information, review side effects and use 2. Supportive care as advised with NSAID / Tylenol PRN fever/myalgias, improve hydration, may take OTC Cold/Flu meds 3. Offered Zofran ODT PRN nausea/vomiting - declined 4. Return criteria given if significant worsening, consider post-influenza complications, otherwise follow-up if needed  Given her history of prior complex recurrent sinusitis, advised if by next week not improved, can call back and we can consider antibiotic for sinuses, otherwise may follow-up with ENT If change of symptoms.   Meds ordered this encounter  Medications  . Baloxavir Marboxil 40 MG Dose (XOFLUZA) 20 (2) MG TBPK    Sig: Take 40 mg by mouth once for 1 dose. For Flu    Dispense:  1 each    Refill:  0    Follow up plan: Return in about 1 week (around 07/27/2017), or if symptoms worsen or fail to improve, for flu sinusitis.   [name], [facility] 07/20/2017, 11:50 AM

## 2017-07-23 ENCOUNTER — Telehealth: Payer: Self-pay | Admitting: Family Medicine

## 2017-07-23 DIAGNOSIS — J0110 Acute frontal sinusitis, unspecified: Secondary | ICD-10-CM

## 2017-07-23 DIAGNOSIS — G9331 Postviral fatigue syndrome: Secondary | ICD-10-CM

## 2017-07-23 DIAGNOSIS — G933 Postviral fatigue syndrome: Secondary | ICD-10-CM

## 2017-07-23 MED ORDER — LEVOFLOXACIN 500 MG PO TABS
500.0000 mg | ORAL_TABLET | Freq: Every day | ORAL | 0 refills | Status: DC
Start: 2017-07-23 — End: 2017-11-14

## 2017-07-23 NOTE — Telephone Encounter (Addendum)
Please notify patient that I have [facility].  If she is not improving or worsening by 72 hours on antibiotic or new concerns, may return to office or seek more immediate medical attention at hospital ED or Urgent Care or her ENT office.  -------------------------------  Last seen 07/20/17, see note and telephone note as well that day.  Possibly post flu complication. [facility] since worsening sinusitis vs lower respiratory infection in patient with similar episodes in past requiring antibiotics, has weakened immune system.  [name], [facility] 07/23/2017, 12:04 PM

## 2017-07-23 NOTE — Telephone Encounter (Signed)
Pt asked to have an antibiotic called to [place].  Her call back [phone]

## 2017-07-23 NOTE — Telephone Encounter (Signed)
Pt.notified

## 2017-07-25 ENCOUNTER — Other Ambulatory Visit: Payer: Self-pay | Admitting: Nurse Practitioner

## 2017-07-25 DIAGNOSIS — J40 Bronchitis, not specified as acute or chronic: Secondary | ICD-10-CM

## 2017-07-25 MED ORDER — ALBUTEROL SULFATE HFA 108 (90 BASE) MCG/ACT IN AERS: 1.0000 | INHALATION_SPRAY | Freq: Four times a day (QID) | RESPIRATORY_TRACT | 11 refills | Status: DC | PRN

## 2017-07-26 ENCOUNTER — Telehealth: Payer: Self-pay | Admitting: Family Medicine

## 2017-07-26 DIAGNOSIS — J40 Bronchitis, not specified as acute or chronic: Secondary | ICD-10-CM

## 2017-07-26 MED ORDER — PREDNISONE 20 MG PO TABS: 40.0000 mg | ORAL_TABLET | Freq: Every day | ORAL | 0 refills | Status: DC

## 2017-07-26 NOTE — Telephone Encounter (Signed)
patient advised

## 2017-07-26 NOTE — Telephone Encounter (Signed)
From our prior conversation, this is reasonable. Will send Prednisone 40mg  daily x 5 days to pharmacy.  If not improving, she needs to follow-up back in office or seek other medical attention.  [name], [facility] 07/26/2017, 12:02 PM

## 2017-07-26 NOTE — Telephone Encounter (Signed)
Pt said it has settles in her chest and antibiotics is not breaking it up.  She asked [facility].  Her call back is [phone]

## 2017-08-09 DIAGNOSIS — Z8249 Family history of ischemic heart disease and other diseases of the circulatory system: Secondary | ICD-10-CM | POA: Diagnosis not present

## 2017-08-09 DIAGNOSIS — Z87891 Personal history of nicotine dependence: Secondary | ICD-10-CM | POA: Diagnosis not present

## 2017-08-09 DIAGNOSIS — R69 Illness, unspecified: Secondary | ICD-10-CM | POA: Diagnosis not present

## 2017-08-09 DIAGNOSIS — I10 Essential (primary) hypertension: Secondary | ICD-10-CM | POA: Diagnosis not present

## 2017-08-09 DIAGNOSIS — E669 Obesity, unspecified: Secondary | ICD-10-CM | POA: Diagnosis not present

## 2017-08-09 DIAGNOSIS — Z6830 Body mass index (BMI) 30.0-30.9, adult: Secondary | ICD-10-CM | POA: Diagnosis not present

## 2017-08-09 DIAGNOSIS — Z833 Family history of diabetes mellitus: Secondary | ICD-10-CM | POA: Diagnosis not present

## 2017-08-09 DIAGNOSIS — J302 Other seasonal allergic rhinitis: Secondary | ICD-10-CM | POA: Diagnosis not present

## 2017-08-16 IMAGING — CT CT BIOPSY
1 of 2 series · 13 of 32 positions shown, 19 images · non-contrast
Comparison: none

INDICATION: Thrombocytopenia of uncertain etiology. Please perform CT-guided
bone marrow biopsy for tissue diagnostic purposes.

[Series 3: osteo bx 2.4 b70s · axial · 0.93mm/px · z∈[+248,+263]mm · 13 of 24 slices shown, 19 images]
[im 2/24  soft-tissue]
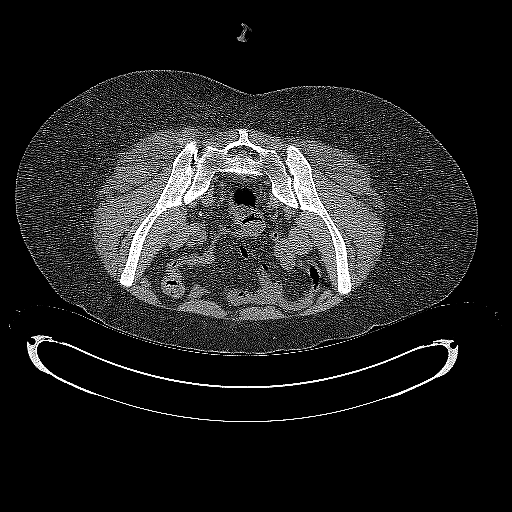
[im 2/24  bone]
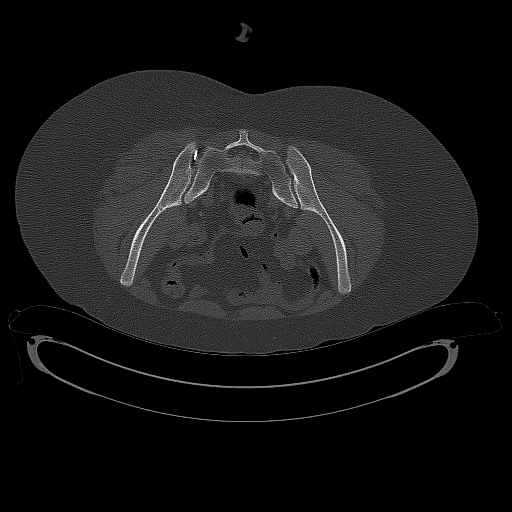
[im 4/24  soft-tissue]
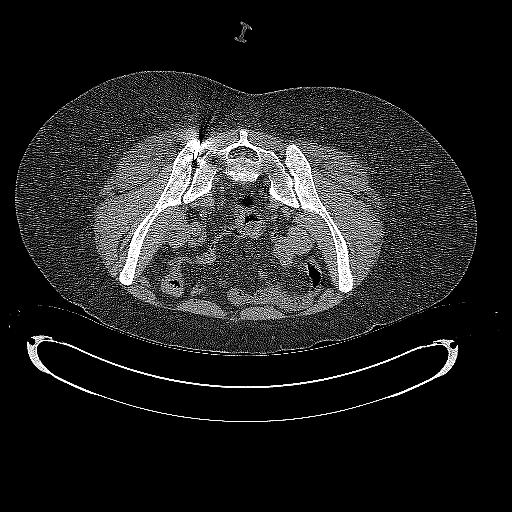
[im 5/24  soft-tissue]
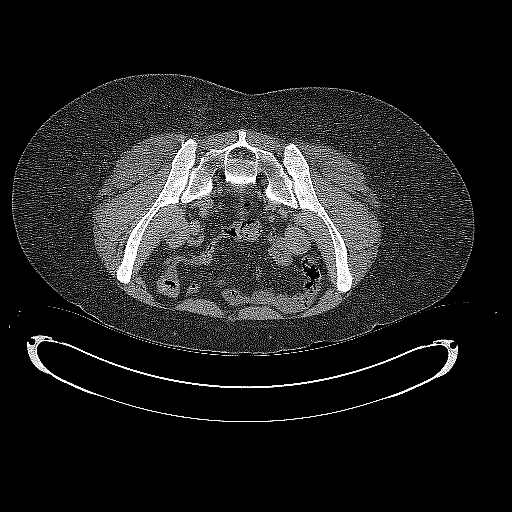
[im 7/24  soft-tissue]
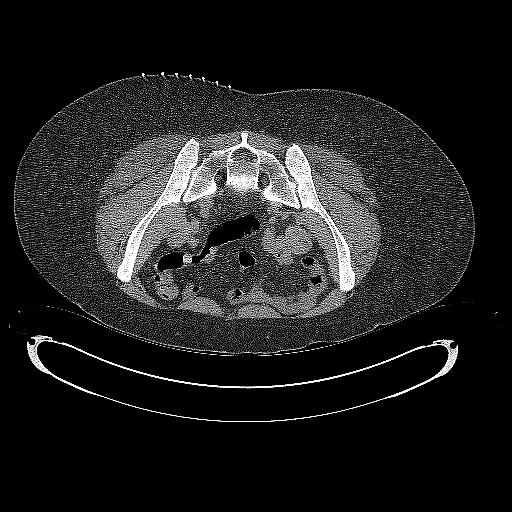
[im 8/24  soft-tissue]
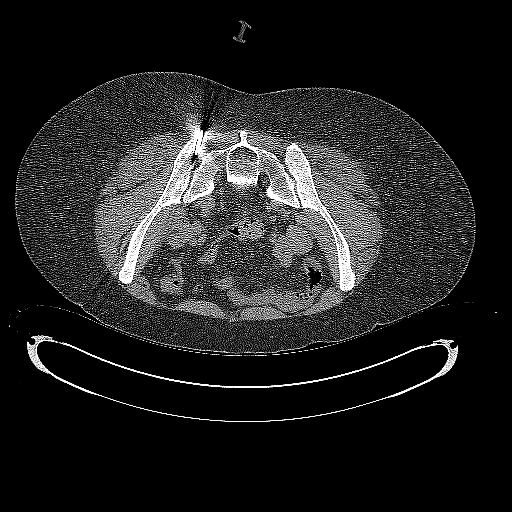
[im 10/24  soft-tissue]
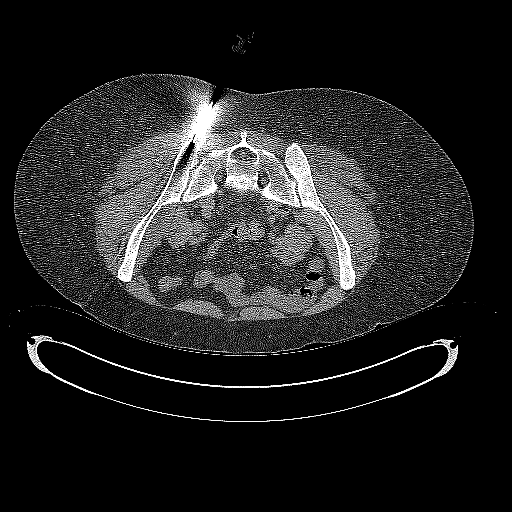
[im 13/24  soft-tissue]
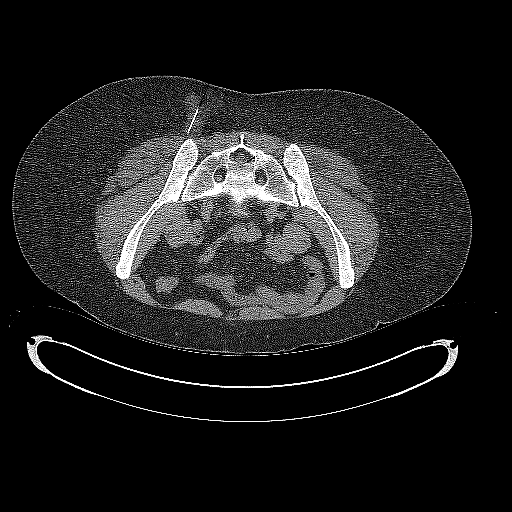
[im 14/24  soft-tissue]
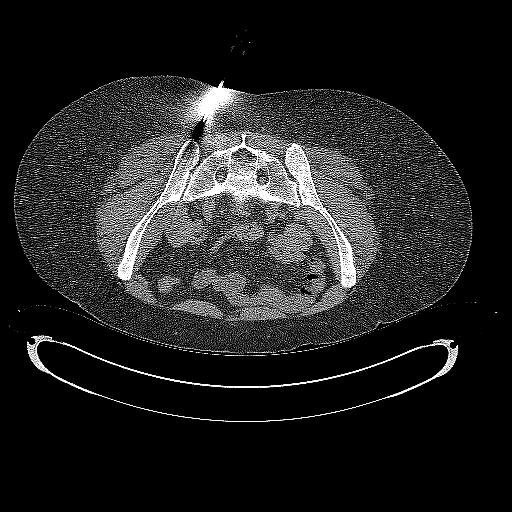
[im 16/24  soft-tissue]
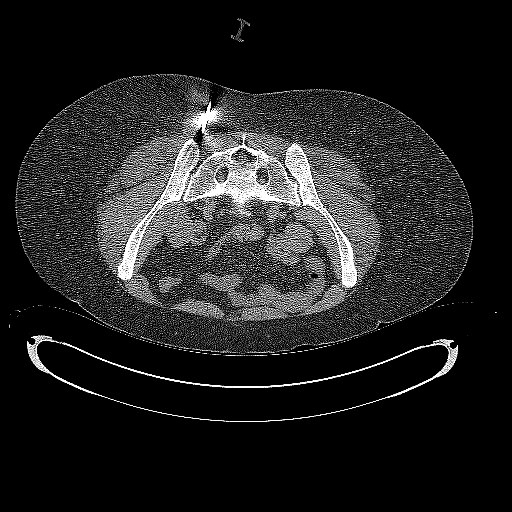
[im 16/24  bone]
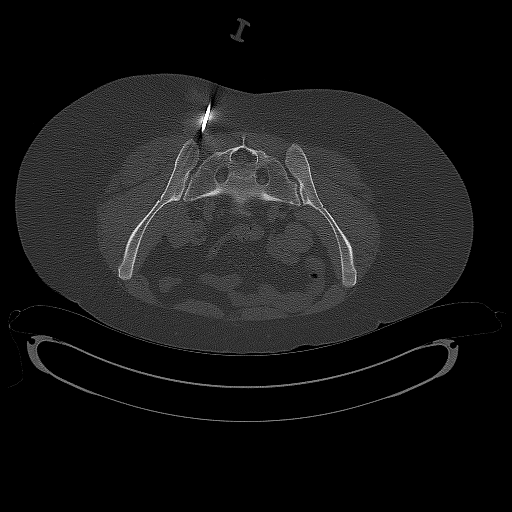
[im 17/24  soft-tissue]
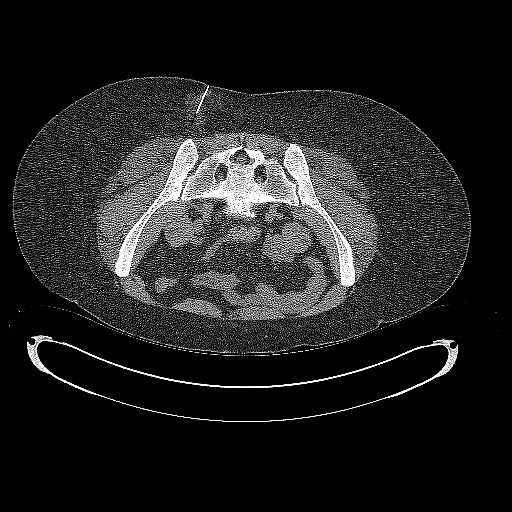
[im 17/24  lung]
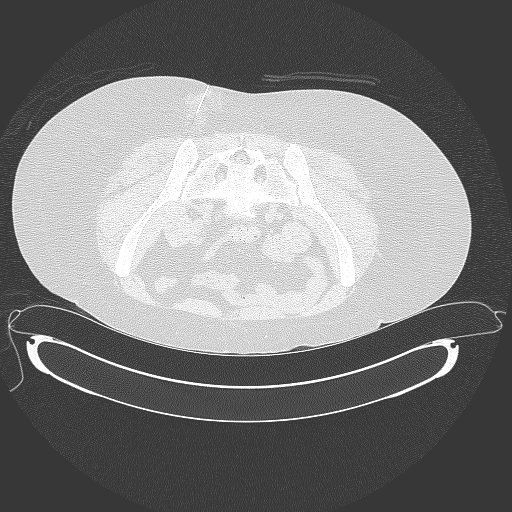
[im 19/24  soft-tissue]
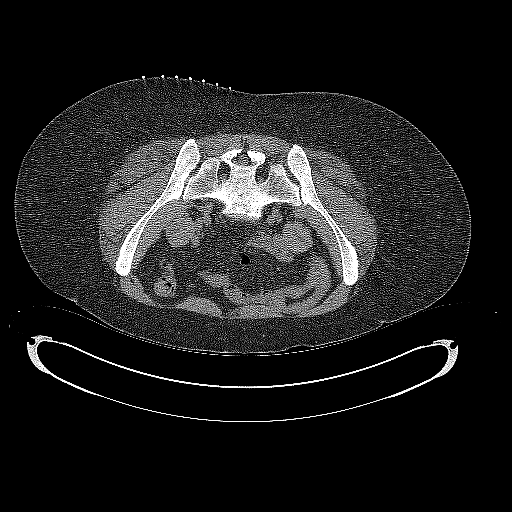
[im 19/24  lung]
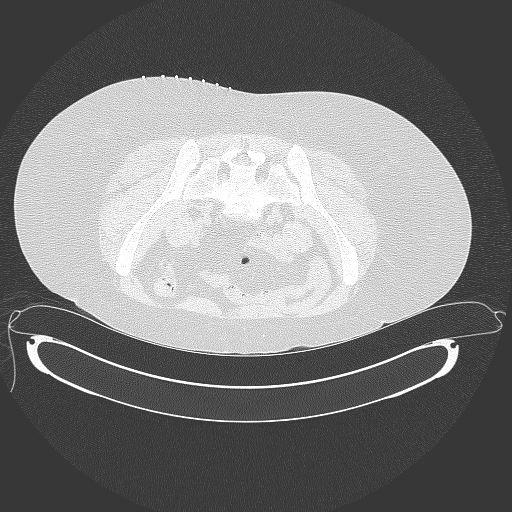
[im 20/24  soft-tissue]
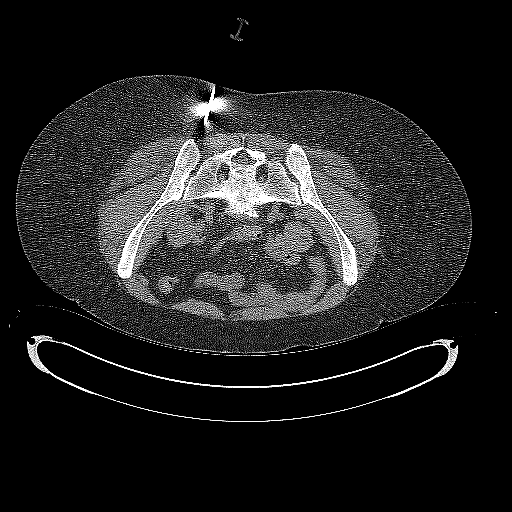
[im 20/24  lung]
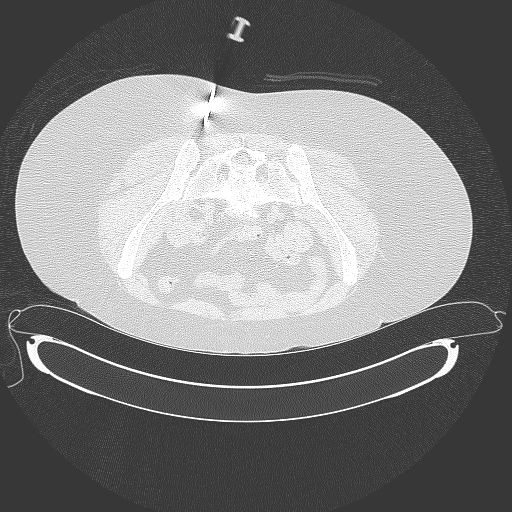
[im 22/24  soft-tissue]
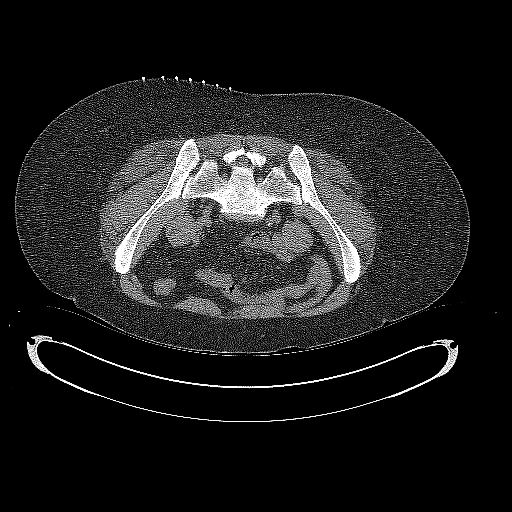
[im 22/24  lung]
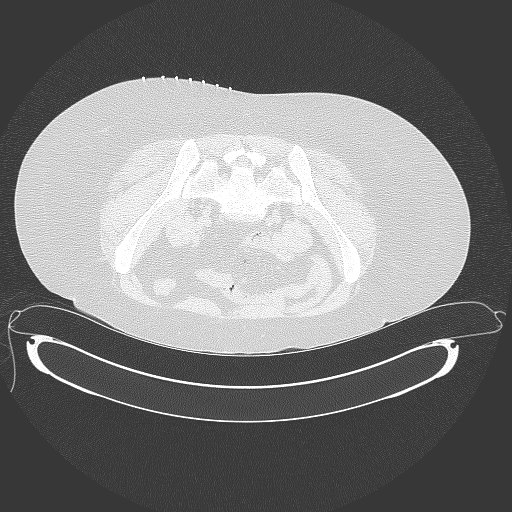

[13 of 32 positions shown; findings below may reference images not displayed]

EXAM:
CT-GUIDED BONE MARROW BIOPSY AND ASPIRATION

MEDICATIONS:
None

ANESTHESIA/SEDATION:
Fentanyl 75 mcg IV

Sedation Time: 9 minutes; The patient was continuously monitored
during the procedure by the interventional radiology nurse under my
direct supervision.

COMPLICATIONS:
None immediate.

PROCEDURE:
Informed consent was obtained from the patient following an
explanation of the procedure, risks, benefits and alternatives. The
patient understands, agrees and consents for the procedure. All
questions were addressed. A time out was performed prior to the
initiation of the procedure. The patient was positioned prone and
non-contrast localization CT was performed of the pelvis to
demonstrate the iliac marrow spaces. The operative site was prepped
and draped in the usual sterile fashion.

Under sterile conditions and local anesthesia, a 22 gauge spinal
needle was utilized for procedural planning. Next, an 11 gauge
coaxial bone biopsy needle was advanced into the left iliac marrow
space. Needle position was confirmed with CT imaging. Initially,
bone marrow aspiration was performed. Next, a bone marrow biopsy was
obtained with the 11 gauge outer bone marrow device. Samples were
prepared with the cytotechnologist and deemed adequate. The needle
was removed intact. Hemostasis was obtained with compression and a
dressing was placed. The patient tolerated the procedure well
without immediate post procedural complication.
IMPRESSION: Successful CT guided left iliac bone marrow aspiration and core
biopsy.

## 2017-08-20 ENCOUNTER — Inpatient Hospital Stay: Payer: Medicare HMO

## 2017-08-21 ENCOUNTER — Inpatient Hospital Stay: Payer: Medicare HMO | Attending: Oncology

## 2017-08-21 DIAGNOSIS — F17210 Nicotine dependence, cigarettes, uncomplicated: Secondary | ICD-10-CM | POA: Diagnosis not present

## 2017-08-21 DIAGNOSIS — G8929 Other chronic pain: Secondary | ICD-10-CM | POA: Insufficient documentation

## 2017-08-21 DIAGNOSIS — I10 Essential (primary) hypertension: Secondary | ICD-10-CM | POA: Insufficient documentation

## 2017-08-21 DIAGNOSIS — Z79899 Other long term (current) drug therapy: Secondary | ICD-10-CM | POA: Insufficient documentation

## 2017-08-21 DIAGNOSIS — R69 Illness, unspecified: Secondary | ICD-10-CM | POA: Diagnosis not present

## 2017-08-21 DIAGNOSIS — M549 Dorsalgia, unspecified: Secondary | ICD-10-CM | POA: Insufficient documentation

## 2017-08-21 DIAGNOSIS — G4700 Insomnia, unspecified: Secondary | ICD-10-CM | POA: Diagnosis not present

## 2017-08-21 DIAGNOSIS — D693 Immune thrombocytopenic purpura: Secondary | ICD-10-CM | POA: Insufficient documentation

## 2017-08-21 LAB — CBC WITH DIFFERENTIAL/PLATELET
BASOS ABS: 0 10*3/uL (ref 0–0.1)
Basophils Relative: 1 %
EOS PCT: 8 %
Eosinophils Absolute: 0.4 10*3/uL (ref 0–0.7)
HCT: 40.4 % (ref 35.0–47.0)
HEMOGLOBIN: 14.1 g/dL (ref 12.0–16.0)
LYMPHS ABS: 1.2 10*3/uL (ref 1.0–3.6)
LYMPHS PCT: 28 %
MCH: 29.5 pg (ref 26.0–34.0)
MCHC: 34.8 g/dL (ref 32.0–36.0)
MCV: 84.9 fL (ref 80.0–100.0)
Monocytes Absolute: 0.3 10*3/uL (ref 0.2–0.9)
Monocytes Relative: 7 %
NEUTROS ABS: 2.4 10*3/uL (ref 1.4–6.5)
NEUTROS PCT: 56 %
PLATELETS: 141 10*3/uL — AB (ref 150–440)
RBC: 4.76 MIL/uL (ref 3.80–5.20)
RDW: 15.2 % — ABNORMAL HIGH (ref 11.5–14.5)
WBC: 4.3 10*3/uL (ref 3.6–11.0)

## 2017-09-04 ENCOUNTER — Other Ambulatory Visit: Payer: Self-pay | Admitting: Nurse Practitioner

## 2017-09-04 DIAGNOSIS — I10 Essential (primary) hypertension: Secondary | ICD-10-CM

## 2017-09-18 ENCOUNTER — Encounter: Payer: Self-pay | Admitting: Nurse Practitioner

## 2017-09-18 ENCOUNTER — Ambulatory Visit (INDEPENDENT_AMBULATORY_CARE_PROVIDER_SITE_OTHER): Payer: Medicare HMO

## 2017-09-18 ENCOUNTER — Ambulatory Visit (INDEPENDENT_AMBULATORY_CARE_PROVIDER_SITE_OTHER): Payer: Medicare HMO | Admitting: Nurse Practitioner

## 2017-09-18 VITALS — BP 122/76 | HR 68 | Temp 98.5°F | Resp 16 | Ht 62.5 in | Wt 171.4 lb

## 2017-09-18 DIAGNOSIS — M25511 Pain in right shoulder: Secondary | ICD-10-CM

## 2017-09-18 DIAGNOSIS — Z0000 Encounter for general adult medical examination without abnormal findings: Secondary | ICD-10-CM

## 2017-09-18 DIAGNOSIS — Z1211 Encounter for screening for malignant neoplasm of colon: Secondary | ICD-10-CM

## 2017-09-18 MED ORDER — PREDNISONE 20 MG PO TABS: ORAL_TABLET | ORAL | 0 refills | Status: DC

## 2017-09-18 NOTE — Progress Notes (Signed)
Subjective:   Erica Bartlett is a 62 y.o. female who presents for Medicare Annual (Subsequent) preventive examination.  Review of Systems:   Cardiac Risk Factors include: hypertension;obesity (BMI >30kg/m2);smoking/ tobacco exposure     Objective:     Vitals: BP 122/76 (BP Location: Left Arm, Patient Position: Sitting)   Pulse 68   Temp 98.5 F (36.9 C) (Oral)   Resp 16   Ht 5' 2.5" (1.588 m)   Wt 171 lb 6.4 oz (77.7 kg)   BMI 30.85 kg/m   Body mass index is 30.85 kg/m.  Advanced Directives 09/18/2017 05/22/2017 12/21/2016 04/26/2016 01/17/2016 10/22/2015 10/11/2015  Does Patient Have a Medical Advance Directive? Yes Yes Yes Yes Yes No Yes  Type of Advance Directive Living will;Healthcare Power of Attorney Living will;Healthcare Power of [place];Living will [place];Living will [place];Living will - -  Copy of [place] in Chart? No - copy requested - No - copy requested No - copy requested No - copy requested - -  Would patient like information on creating a medical advance directive? - - No - Patient declined - - - -    Tobacco Social History   Tobacco Use  Smoking Status Current Some Day Smoker  . Packs/day: 0.15  . Types: Cigarettes  Smokeless Tobacco Former Systems developer  Tobacco Comment   1 pack takes 3 weeks to finish      Ready to quit: Yes Counseling given: Yes Comment: 1 pack takes 3 weeks to finish    Clinical Intake:  Pre-visit preparation completed: Yes  Pain : 0-10 Pain Score: 8  Pain Type: Acute pain Pain Location: Shoulder Pain Orientation: Right Pain Descriptors / Indicators: Radiating, Aching, Tingling Pain Onset: In the past 7 days Pain Frequency: Constant     Nutritional Status: BMI > 30  Obese Nutritional Risks: None Diabetes: No  How often do you need to have someone help you when you read instructions, pamphlets, or other written materials from your  doctor or pharmacy?: 1 - Never What is the last grade level you completed in school?: 2 years college   Interpreter Needed?: No  Information entered by :: Braylynn Lewing,LPN   Past Medical History:  Diagnosis Date  . Hypertension    Past Surgical History:  Procedure Laterality Date  . ABDOMINAL HYSTERECTOMY    . BREAST BIOPSY Left    age 37 surgical bx neg  . CESAREAN SECTION    . cyst removed     breast, benign   Family History  Problem Relation Age of Onset  . Diabetes Mother   . Heart failure Father   . Kidney failure Father    Social History   Socioeconomic History  . Marital status: Widowed    Spouse name: Not on file  . Number of children: Not on file  . Years of education: Not on file  . Highest education level: Not on file  Occupational History  . Not on file  Social Needs  . Financial resource strain: Hard  . Food insecurity:    Worry: Sometimes true    Inability: Sometimes true  . Transportation needs:    Medical: Yes    Non-medical: No  Tobacco Use  . Smoking status: Current Some Day Smoker    Packs/day: 0.15    Types: Cigarettes  . Smokeless tobacco: Former Systems developer  . Tobacco comment: 1 pack takes 3 weeks to finish   Substance and Sexual  Activity  . Alcohol use: No  . Drug use: No  . Sexual activity: Yes    Birth control/protection: Post-menopausal  Lifestyle  . Physical activity:    Days per week: 0 days    Minutes per session: 0 min  . Stress: Only a little  Relationships  . Social connections:    Talks on phone: More than three times a week    Gets together: More than three times a week    Attends religious service: More than 4 times per year    Active member of club or organization: Yes    Attends meetings of clubs or organizations: More than 4 times per year    Relationship status: Widowed  Other Topics Concern  . Not on file  Social History Narrative  . Not on file    Outpatient Encounter Medications as of 09/18/2017  Medication  Sig  . acetaminophen (TYLENOL) 500 MG tablet Take 500 mg by mouth every 6 (six) hours as needed.  Marland Kitchen albuterol (PROVENTIL HFA;VENTOLIN HFA) 108 (90 Base) MCG/ACT inhaler Inhale 1-2 puffs into the lungs every 6 (six) hours as needed for wheezing or shortness of breath.  Marland Kitchen amLODipine (NORVASC) 10 MG tablet TAKE 1 TABLET (10 MG TOTAL) BY MOUTH DAILY.  . cetirizine (ZYRTEC) 10 MG tablet Take 1 tablet (10 mg total) by mouth daily.  . clonazePAM (KLONOPIN) 1 MG tablet Take 1-2 mg by mouth 3 (three) times daily as needed. Reported on 10/11/2015  . fluticasone (FLONASE) 50 MCG/ACT nasal spray Place 2 sprays into both nostrils daily.  . hydrochlorothiazide (HYDRODIURIL) 25 MG tablet Take 1 tablet (25 mg total) by mouth daily.  Marland Kitchen gabapentin (NEURONTIN) 100 MG capsule Take 1 capsule (100 mg total) by mouth 3 (three) times daily. Or 2 capsules (200 mg) at night.  Marland Kitchen ipratropium (ATROVENT) 0.06 % nasal spray Place 2 sprays into both nostrils 4 (four) times daily. For up to 5-7 days then stop.  Marland Kitchen levofloxacin (LEVAQUIN) 500 MG tablet Take 1 tablet (500 mg total) by mouth daily. For 7 days  . ondansetron (ZOFRAN) 4 MG tablet Take 1 tablet (4 mg total) by mouth every 8 (eight) hours as needed for nausea or vomiting.  . predniSONE (DELTASONE) 20 MG tablet Take 2 tablets (40 mg total) by mouth daily with breakfast. For 5 days   No facility-administered encounter medications on file as of 09/18/2017.     Activities of Daily Living In your present state of health, do you have any difficulty performing the following activities: 09/18/2017  Hearing? N  Vision? N  Difficulty concentrating or making decisions? N  Walking or climbing stairs? N  Dressing or bathing? N  Doing errands, shopping? N  Preparing Food and eating ? N  Using the Toilet? N  In the past six months, have you accidently leaked urine? N  Do you have problems with loss of bowel control? N  Managing your Medications? N  Managing your Finances? N    Housekeeping or managing your Housekeeping? N  Some recent data might be hidden    Patient Care Team: Mikey College, NP as PCP - General (Nurse Practitioner) Chucky May, MD as Consulting Physician (Psychiatry) Lloyd Huger, MD as Consulting Physician (Oncology)    Assessment:   This is a routine wellness examination for Erica Bartlett.  Exercise Activities and Dietary recommendations Current Exercise Habits: The patient does not participate in regular exercise at present, Exercise limited by: None identified  Goals    .  Quit Smoking     Smoking cessation discussed       Fall Risk Fall Risk  09/18/2017 11/23/2016 11/09/2016 08/17/2016 05/26/2016  Falls in the past year? No No No No No   Is the patient's home free of loose throw rugs in walkways, pet beds, electrical cords, etc?   yes      Grab bars in the bathroom? yes      Handrails on the stairs?   yes      Adequate lighting?   yes  Timed Get Up and Go performed: Completed in 8 seconds with no use of assistive devices, steady gait. No intervention needed at this time.   Depression Screen PHQ 2/9 Scores 09/18/2017 08/17/2016 05/26/2016 10/28/2015  PHQ - 2 Score 2 1 1 4   PHQ- 9 Score 11 11 - 14     Cognitive Function     6CIT Screen 09/18/2017  What Year? 0 points  What month? 0 points  What time? 0 points  Count back from 20 0 points  Months in reverse 0 points  Repeat phrase 0 points  Total Score 0    Immunization History  Administered Date(s) Administered  . Influenza,inj,Quad PF,6+ Mos 02/18/2015    Qualifies for Shingles Vaccine?ys, discussed shingrix  Screening Tests Health Maintenance  Topic Date Due  . Hepatitis C Screening  Jul 22, 1955  . COLONOSCOPY  03/14/2018 (Originally 03/15/2008)  . PAP SMEAR  10/28/2018 (Originally 03/16/1979)  . INFLUENZA VACCINE  11/22/2017  . MAMMOGRAM  09/13/2018  . TETANUS/TDAP  07/27/2024  . HIV Screening  Completed    Cancer Screenings: Lung: Low Dose CT Chest  recommended if Age 13-80 years, 30 pack-year currently smoking OR have quit w/in 15years. Patient does not qualify. Breast:  Up to date on Mammogram? Yes   Up to date of Bone Density/Dexa? Not indicated Colorectal: due- referral placed  Additional Screenings: : Hepatitis C Screening: will order for future labwork     Plan:    I have personally reviewed and addressed the Medicare Annual Wellness questionnaire and have noted the following in the patient's chart:  A. Medical and social history B. Use of alcohol, tobacco or illicit drugs  C. Current medications and supplements D. Functional ability and status E.  Nutritional status F.  Physical activity G. Advance directives H. List of other physicians I.  Hospitalizations, surgeries, and ER visits in previous 12 months J.  [place] such as hearing and vision if needed, cognitive and depression L. Referrals and appointments   In addition, I have reviewed and discussed with patient certain preventive protocols, quality metrics, and best practice recommendations. A written personalized care plan for preventive services as well as general preventive health recommendations were provided to patient.   Signed,  Tyler Aas, LPN Nurse Health Advisor   Nurse Notes: due for PAP smear, request to have done at [facility] at physical.  Referral sent for colonoscopy.    Scored 11 on PHQ-9 - takes klonipin for anxiety, grandmother passed away this weekend. - Sees Dr.Kaur

## 2017-09-18 NOTE — Progress Notes (Signed)
Subjective:    Patient ID: Erica Bartlett, female    DOB: Jan 09, 1956, 62 y.o.   MRN: [id number]  Erica Bartlett is a 62 y.o. female presenting on 09/18/2017 for Shoulder Pain (Right shoulder pain x 1 week since working in garden. 8 pain scale  previous issues years ago.)   HPI Right Shoulder Pain Pain started last week on Friday and working with a hoe and lifting buckets with water.  Pain started gradually, but was severe that night in bed.  Is aching and radiating down arm with numbness to fingertips. Has taken only tylenol for pain and Aleeve for a few doses.  This did not help any more than Tylenol for pain.  Neither is providing significant relief. - She also helps her father with mobility and often uses her right arm and shoulder from lifting him from the wheelchair.  Social History   Tobacco Use  . Smoking status: Current Some Day Smoker    Packs/day: 0.15    Types: Cigarettes  . Smokeless tobacco: Former Systems developer  . Tobacco comment: 1 pack takes 3 weeks to finish   Substance Use Topics  . Alcohol use: No  . Drug use: No    Review of Systems Per HPI unless specifically indicated above     Objective:    BP 122/76   Pulse 68   Temp 98.5 F (36.9 C)   Resp 16   Ht 5' 2.5" (1.588 m)   Wt 171 lb 6.4 oz (77.7 kg)   BMI 30.85 kg/m   Wt Readings from Last 3 Encounters:  09/18/17 171 lb 6.4 oz (77.7 kg)  09/18/17 171 lb 6.4 oz (77.7 kg)  07/20/17 169 lb (76.7 kg)    Physical Exam  Constitutional: She is oriented to person, place, and time. She appears well-developed and well-nourished. No distress.  HENT:  Head: Normocephalic and atraumatic.  Musculoskeletal:  Right Shoulder Inspection: Normal appearance bilateral symmetrical Palpation: Exquisitely tender to palpation over lateral/posterior shoulder  ROM: Significantly reduced AROM forward flexion, abduction, internal / external rotation, greater but still limited PROM forward flexion, abduction, internal /  external rotation.  Left shoulder normal ROM. Special Testing: Rotator cuff testing positive for pain and weakness with supraspinatus full can and empty can test, O'brien's negative for labral pain, Hawkin's AC impingement positive for pain Strength: Reduced strength 3-4/5 flex/ext, ext rot / int rot, grip, rotator cuff str testing. Neurovascular: Distally intact pulses, sensation to light touch.  Increased radicular pain with palpation of shoulder joint.  Neurological: She is alert and oriented to person, place, and time.  Skin: Skin is warm and dry.  Psychiatric: She has a normal mood and affect. Her behavior is normal.  Vitals reviewed.    Results for orders placed or performed in visit on 08/21/17  CBC with Differential/Platelet  Result Value Ref Range   WBC 4.3 3.6 - 11.0 K/uL   RBC 4.76 3.80 - 5.20 MIL/uL   Hemoglobin 14.1 12.0 - 16.0 g/dL   HCT 40.4 35.0 - 47.0 %   MCV 84.9 80.0 - 100.0 fL   MCH 29.5 26.0 - 34.0 pg   MCHC 34.8 32.0 - 36.0 g/dL   RDW 15.2 (H) 11.5 - 14.5 %   Platelets 141 (L) 150 - 440 K/uL   Neutrophils Relative % 56 %   Neutro Abs 2.4 1.4 - 6.5 K/uL   Lymphocytes Relative 28 %   Lymphs Abs 1.2 1.0 - 3.6 K/uL   Monocytes Relative 7 %  Monocytes Absolute 0.3 0.2 - 0.9 K/uL   Eosinophils Relative 8 %   Eosinophils Absolute 0.4 0 - 0.7 K/uL   Basophils Relative 1 %   Basophils Absolute 0.0 0 - 0.1 K/uL      Assessment & Plan:   Problem List Items Addressed This Visit      Other   Right shoulder pain - Primary   Relevant Medications   predniSONE (DELTASONE) 20 MG tablet   Other Relevant Orders   Ambulatory referral to Orthopedic Surgery    Consistent with acute shoulder bursitis vs rotator cuff tendinopathy with reduced active ROM and with evidence of muscle tear (weakness). Known repetitive activity as likely etiology.  62 year-old patient with likely underlying arthritis and prior shoulder injury in June 2017. - Imaging October 22 2015  Plan: 1.  Start prednisone taper over 7 days Day 1-2: 60 mg, Day 3: 50 mg, Day 4: 40 mg; Day 5: 30 mg; Day 6 20 mg; Day 7: 10 mg then stop. 2. May take Tylenol Ex Str 1-2 q 6 hr PRN.  After prednisone, may take ibuprofen/aleeve 3. Relative rest but keep shoulder mobile, demonstrated ROM exercises, avoid heavy lifting.  Provided ROM exercise handout.  Advised to avoid exercises or overuse with pain. 4. May try heating pad PRN 5. Referral orthopedics.  6. Follow-up 2-4 weeks if not improved for re-evaluation, consider referral to Physical Therapy, X-rays, and or subacromial steroid injection    Meds ordered this encounter  Medications  . predniSONE (DELTASONE) 20 MG tablet    Sig: Day 1-4 take 3 pills once daily.  Day 5-6 take 2 pills.  Day 7-8 take 1 pill.  Day 9-10 take 1/2 pill.    Dispense:  19 tablet    Refill:  0    Order Specific Question:   Supervising Provider    Answer:   Olin Hauser [[id number]]    Follow up plan: Return 2-4 weeks if symptoms worsen or fail to improve.  Cassell Smiles, DNP, AGPCNP-BC Adult Gerontology Primary Care Nurse Practitioner [facility] 09/18/2017, 9:38 AM

## 2017-09-18 NOTE — Patient Instructions (Addendum)
Erica Bartlett , Thank you for taking time to come for your Medicare Wellness Visit. I appreciate your ongoing commitment to your health goals. Please review the following plan we discussed and let me know if I can assist you in the future.   Screening recommendations/referrals: Colonoscopy: referral placed, someone will call you to schedule this.  Mammogram: completed 09/12/2016 Bone Density: due at age 62 Recommended yearly ophthalmology/optometry visit for glaucoma screening and checkup Recommended yearly dental visit for hygiene and checkup  Vaccinations: Influenza vaccine: due 12/2017 Pneumococcal vaccine: up to date Tdap vaccine: up to date Shingles vaccine: shingrix eligible, check with your insurance company for coverage information  Advanced directives:  Please bring a copy of your health care power of attorney and living will to the office at your convenience.  Conditions/risks identified: Smoking cessation discussed  Next appointment: Follow up with Lissa Merlin in 1-2 months for your annual physical. Follow up in one year for your annual wellness exam.   Preventive Care 40-64 Years, Female Preventive care refers to lifestyle choices and visits with your health care provider that can promote health and wellness. What does preventive care include?  A yearly physical exam. This is also called an annual well check.  Dental exams once or twice a year.  Routine eye exams. Ask your health care provider how often you should have your eyes checked.  Personal lifestyle choices, including:  Daily care of your teeth and gums.  Regular physical activity.  Eating a healthy diet.  Avoiding tobacco and drug use.  Limiting alcohol use.  Practicing safe sex.  Taking low-dose aspirin daily starting at age 4.  Taking vitamin and mineral supplements as recommended by your health care provider. What happens during an annual well check? The services and screenings done by  your health care provider during your annual well check will depend on your age, overall health, lifestyle risk factors, and family history of disease. Counseling  Your health care provider may ask you questions about your:  Alcohol use.  Tobacco use.  Drug use.  Emotional well-being.  Home and relationship well-being.  Sexual activity.  Eating habits.  Work and work Statistician.  Method of birth control.  Menstrual cycle.  Pregnancy history. Screening  You may have the following tests or measurements:  Height, weight, and BMI.  Blood pressure.  Lipid and cholesterol levels. These may be checked every 5 years, or more frequently if you are over 21 years old.  Skin check.  Lung cancer screening. You may have this screening every year starting at age 39 if you have a 30-pack-year history of smoking and currently smoke or have quit within the past 15 years.  Fecal occult blood test (FOBT) of the stool. You may have this test every year starting at age 45.  Flexible sigmoidoscopy or colonoscopy. You may have a sigmoidoscopy every 5 years or a colonoscopy every 10 years starting at age 23.  Hepatitis C blood test.  Hepatitis B blood test.  Sexually transmitted disease (STD) testing.  Diabetes screening. This is done by checking your blood sugar (glucose) after you have not eaten for a while (fasting). You may have this done every 1-3 years.  Mammogram. This may be done every 1-2 years. Talk to your health care provider about when you should start having regular mammograms. This may depend on whether you have a family history of breast cancer.  BRCA-related cancer screening. This may be done if you have a family history of breast,  ovarian, tubal, or peritoneal cancers.  Pelvic exam and Pap test. This may be done every 3 years starting at age 58. Starting at age 54, this may be done every 5 years if you have a Pap test in combination with an HPV test.  Bone density  scan. This is done to screen for osteoporosis. You may have this scan if you are at high risk for osteoporosis. Discuss your test results, treatment options, and if necessary, the need for more tests with your health care provider. Vaccines  Your health care provider may recommend certain vaccines, such as:  Influenza vaccine. This is recommended every year.  Tetanus, diphtheria, and acellular pertussis (Tdap, Td) vaccine. You may need a Td booster every 10 years.  Zoster vaccine. You may need this after age 36.  Pneumococcal 13-valent conjugate (PCV13) vaccine. You may need this if you have certain conditions and were not previously vaccinated.  Pneumococcal polysaccharide (PPSV23) vaccine. You may need one or two doses if you smoke cigarettes or if you have certain conditions. Talk to your health care provider about which screenings and vaccines you need and how often you need them. This information is not intended to replace advice given to you by your health care provider. Make sure you discuss any questions you have with your health care provider. Document Released: 05/07/2015 Document Revised: 12/29/2015 Document Reviewed: 02/09/2015 Elsevier Interactive Patient Education  2017 [employer] Prevention in the Home Falls can cause injuries. They can happen to people of all ages. There are many things you can do to make your home safe and to help prevent falls. What can I do on the outside of my home?  Regularly fix the edges of walkways and driveways and fix any cracks.  Remove anything that might make you trip as you walk through a door, such as a raised step or threshold.  Trim any bushes or trees on the path to your home.  Use bright outdoor lighting.  Clear any walking paths of anything that might make someone trip, such as rocks or tools.  Regularly check to see if handrails are loose or broken. Make sure that both sides of any steps have handrails.  Any raised  decks and porches should have guardrails on the edges.  Have any leaves, snow, or ice cleared regularly.  Use sand or salt on walking paths during winter.  Clean up any spills in your garage right away. This includes oil or grease spills. What can I do in the bathroom?  Use night lights.  Install grab bars by the toilet and in the tub and shower. Do not use towel bars as grab bars.  Use non-skid mats or decals in the tub or shower.  If you need to sit down in the shower, use a plastic, non-slip stool.  Keep the floor dry. Clean up any water that spills on the floor as soon as it happens.  Remove soap buildup in the tub or shower regularly.  Attach bath mats securely with double-sided non-slip rug tape.  Do not have throw rugs and other things on the floor that can make you trip. What can I do in the bedroom?  Use night lights.  Make sure that you have a light by your bed that is easy to reach.  Do not use any sheets or blankets that are too big for your bed. They should not hang down onto the floor.  Have a firm chair that has side  arms. You can use this for support while you get dressed.  Do not have throw rugs and other things on the floor that can make you trip. What can I do in the kitchen?  Clean up any spills right away.  Avoid walking on wet floors.  Keep items that you use a lot in easy-to-reach places.  If you need to reach something above you, use a strong step stool that has a grab bar.  Keep electrical cords out of the way.  Do not use floor polish or wax that makes floors slippery. If you must use wax, use non-skid floor wax.  Do not have throw rugs and other things on the floor that can make you trip. What can I do with my stairs?  Do not leave any items on the stairs.  Make sure that there are handrails on both sides of the stairs and use them. Fix handrails that are broken or loose. Make sure that handrails are as long as the stairways.  Check  any carpeting to make sure that it is firmly attached to the stairs. Fix any carpet that is loose or worn.  Avoid having throw rugs at the top or bottom of the stairs. If you do have throw rugs, attach them to the floor with carpet tape.  Make sure that you have a light switch at the top of the stairs and the bottom of the stairs. If you do not have them, ask someone to add them for you. What else can I do to help prevent falls?  Wear shoes that:  Do not have high heels.  Have rubber bottoms.  Are comfortable and fit you well.  Are closed at the toe. Do not wear sandals.  If you use a stepladder:  Make sure that it is fully opened. Do not climb a closed stepladder.  Make sure that both sides of the stepladder are locked into place.  Ask someone to hold it for you, if possible.  Clearly mark and make sure that you can see:  Any grab bars or handrails.  First and last steps.  Where the edge of each step is.  Use tools that help you move around (mobility aids) if they are needed. These include:  Canes.  Walkers.  Scooters.  Crutches.  Turn on the lights when you go into a dark area. Replace any light bulbs as soon as they burn out.  Set up your furniture so you have a clear path. Avoid moving your furniture around.  If any of your floors are uneven, fix them.  If there are any pets around you, be aware of where they are.  Review your medicines with your doctor. Some medicines can make you feel dizzy. This can increase your chance of falling. Ask your doctor what other things that you can do to help prevent falls. This information is not intended to replace advice given to you by your health care provider. Make sure you discuss any questions you have with your health care provider. Document Released: 02/04/2009 Document Revised: 09/16/2015 Document Reviewed: 05/15/2014 Elsevier Interactive Patient Education  2017 [employer].

## 2017-09-18 NOTE — Patient Instructions (Addendum)
Erica Bartlett,   Thank you for coming in to clinic today.  1. You have a rotator cuff muscle strain.  Possibly a tear.   - Start taking Tylenol extra strength 1 to 2 tablets every 6-8 hours for aches or fever/chills for next few days as needed.  Do not take more than 3,000 mg in 24 hours from all medicines.  May take Ibuprofen as well if tolerated 200-400mg  every 8 hours as needed. May alternate tylenol and ibuprofen in same day. - Use heat and ice.  Apply this for 15 minutes at a time 6-8 times per day.   - Muscle rub with lidocaine, lidocaine patch, Biofreeze, or tiger balm for topical pain relief.  Avoid using this with heat and ice to avoid burns. - Start prednisone taper over 10 days. Take prednisone taper 20 mg tablets Day 1-4 take 3 pills at one time; Day 5-6: Take 2 pills; Day 7-8: Take 1 pills; Day 9-10: Take 1/2 pill; then stop.  Please schedule a follow-up appointment with Cassell Smiles, AGNP. Return 2-4 weeks if symptoms worsen or fail to improve.  If you have any other questions or concerns, please feel free to call the clinic or send a message through [place]. You may also schedule an earlier appointment if necessary.  You will receive a survey after today's visit either digitally by e-mail or paper by [employer]. Your experiences and feedback matter to [place].  Please respond so we know how we are doing as we provide care for you.   Cassell Smiles, DNP, AGNP-BC Adult Gerontology Nurse Practitioner [facility], [facility]   Shoulder Range of Motion Exercises Shoulder range of motion (ROM) exercises are designed to keep the shoulder moving freely. They are often recommended for people who have shoulder pain. Phase 1 exercises When you are able, do this exercise 5-6 days per week, or as told by your health care provider. Work toward doing 2 sets of 10 swings. Pendulum Exercise How To Do This Exercise Lying Down 1. Lie face-down on a bed with your abdomen close to  the side of the bed. 2. Let your arm hang over the side of the bed. 3. Relax your shoulder, arm, and hand. 4. Slowly and gently swing your arm forward and back. Do not use your neck muscles to swing your arm. They should be relaxed. If you are struggling to swing your arm, have someone gently swing it for you. When you do this exercise for the first time, swing your arm at a 15 degree angle for 15 seconds, or swing your arm 10 times. As pain lessens over time, increase the angle of the swing to 30-45 degrees. 5. Repeat steps 1-4 with the other arm.  How To Do This Exercise While Standing 1. Stand next to a sturdy chair or table and hold on to it with your hand. 1. Bend forward at the waist. 2. Bend your knees slightly. 3. Relax your other arm and let it hang limp. 4. Relax the shoulder blade of the arm that is hanging and let it drop. 5. While keeping your shoulder relaxed, use body motion to swing your arm in small circles. The first time you do this exercise, swing your arm for about 30 seconds or 10 times. When you do it next time, swing your arm for a little longer. 6. Stand up tall and relax. 7. Repeat steps 1-7, this time changing the direction of the circles. 2. Repeat steps 1-8 with  the other arm.  Phase 2 exercises Do these exercises 3-4 times per day on 5-6 days per week or as told by your health care provider. Work toward holding the stretch for 20 seconds. Stretching Exercise 1 1. Lift your arm straight out in front of you. 2. Bend your arm 90 degrees at the elbow (right angle) so your forearm goes across your body and looks like the letter "L." 3. Use your other arm to gently pull the elbow forward and across your body. 4. Repeat steps 1-3 with the other arm. Stretching Exercise 2 You will need a towel or rope for this exercise. 1. Bend one arm behind your back with the palm facing outward. 2. Hold a towel with your other hand. 3. Reach the arm that holds the towel above your  head, and bend that arm at the elbow. Your wrist should be behind your neck. 4. Use your free hand to grab the free end of the towel. 5. With the higher hand, gently pull the towel up behind you. 6. With the lower hand, pull the towel down behind you. 7. Repeat steps 1-6 with the other arm.  Phase 3 exercises Do each of these exercises at four different times of day (sessions) every day or as told by your health care provider. To begin with, repeat each exercise 5 times (repetitions). Work toward doing 3 sets of 12 repetitions or as told by your health care provider. Strengthening Exercise 1 You will need a light weight for this activity. As you grow stronger, you may use a heavier weight. 1. Standing with a weight in your hand, lift your arm straight out to the side until it is at the same height as your shoulder. 2. Bend your arm at 90 degrees so that your fingers are pointing to the ceiling. 3. Slowly raise your hand until your arm is straight up in the air. 4. Repeat steps 1-3 with the other arm.  Strengthening Exercise 2 You will need a light weight for this activity. As you grow stronger, you may use a heavier weight. 1. Standing with a weight in your hand, gradually move your straight arm in an arc, starting at your side, then out in front of you, then straight up over your head. 2. Gradually move your other arm in an arc, starting at your side, then out in front of you, then straight up over your head. 3. Repeat steps 1-2 with the other arm.  Strengthening Exercise 3 You will need an elastic band for this activity. As you grow stronger, gradually increase the size of the bands or increase the number of bands that you use at one time. 1. While standing, hold an elastic band in one hand and raise that arm up in the air. 2. With your other hand, pull down the band until that hand is by your side. 3. Repeat steps 1-2 with the other arm.  This information is not intended to replace  advice given to you by your health care provider. Make sure you discuss any questions you have with your health care provider. Document Released: 01/07/2003 Document Revised: 12/05/2015 Document Reviewed: 04/06/2014 Elsevier Interactive Patient Education  [employer].

## 2017-09-19 ENCOUNTER — Other Ambulatory Visit: Payer: Self-pay

## 2017-09-19 DIAGNOSIS — Z1211 Encounter for screening for malignant neoplasm of colon: Secondary | ICD-10-CM

## 2017-10-02 ENCOUNTER — Encounter: Admission: RE | Payer: Self-pay | Source: Ambulatory Visit

## 2017-10-02 ENCOUNTER — Ambulatory Visit: Admission: RE | Admit: 2017-10-02 | Payer: Medicare HMO | Source: Ambulatory Visit | Admitting: Gastroenterology

## 2017-10-02 ENCOUNTER — Encounter: Payer: Self-pay | Admitting: Anesthesiology

## 2017-10-02 SURGERY — COLONOSCOPY WITH PROPOFOL
Anesthesia: General

## 2017-11-12 NOTE — Progress Notes (Signed)
[place]  Telephone:(336) [phone] Fax:(336) [phone]  ID: Erica Bartlett OB: [birth year]  MR#: [id number]  HTX#:[id number]  Patient Care Team: Mikey College, NP as PCP - General (Nurse Practitioner) Chucky May, MD as Consulting Physician (Psychiatry) Lloyd Huger, MD as Consulting Physician (Oncology)  CHIEF COMPLAINT: ITP  INTERVAL HISTORY: Patient returns to clinic today for repeat laboratory work and further evaluation.  She currently feels well and is asymptomatic.  She does not complain of easy bleeding or bruising.  She does not complain of weakness or fatigue. She continues to have chronic back pain and difficulty sleeping. She denies any recent fevers or illnesses. She has no neurologic complaints. She denies any chest pain or shortness of breath. She has no nausea, vomiting, constipation, or diarrhea. She has no urinary complaints.  Patient feels at her baseline offers no further specific complaints today.  REVIEW OF SYSTEMS:    Review of Systems  Constitutional: Negative.  Negative for fever, malaise/fatigue and weight loss.  Respiratory: Negative.  Negative for cough and shortness of breath.   Cardiovascular: Negative.  Negative for chest pain and leg swelling.  Gastrointestinal: Negative.  Negative for blood in stool, melena and nausea.  Genitourinary: Negative.  Negative for hematuria.  Musculoskeletal: Positive for back pain. Negative for joint pain.  Skin: Negative.  Negative for rash.  Neurological: Negative for sensory change and weakness.  Endo/Heme/Allergies: Does not bruise/bleed easily.  Psychiatric/Behavioral: Negative.  The patient is not nervous/anxious and does not have insomnia.     As per HPI. Otherwise, a complete review of systems is negative.  PAST MEDICAL HISTORY: Past Medical History:  Diagnosis Date  . Hypertension     PAST SURGICAL HISTORY: Past Surgical History:  Procedure Laterality Date  .  ABDOMINAL HYSTERECTOMY    . BREAST BIOPSY Left    age 30 surgical bx neg  . CESAREAN SECTION    . cyst removed     breast, benign    FAMILY HISTORY: Reviewed and unchanged. No reported history of malignancy or chronic disease.     ADVANCED DIRECTIVES:    HEALTH MAINTENANCE: Social History   Tobacco Use  . Smoking status: Current Some Day Smoker    Packs/day: 0.15    Types: Cigarettes  . Smokeless tobacco: Former Systems developer  . Tobacco comment: 1 pack takes 3 weeks to finish   Substance Use Topics  . Alcohol use: No  . Drug use: No    Allergies  Allergen Reactions  . Doxycycline Nausea And Vomiting  . Penicillins Hives    Current Outpatient Medications  Medication Sig Dispense Refill  . albuterol (PROVENTIL HFA;VENTOLIN HFA) 108 (90 Base) MCG/ACT inhaler Inhale 1-2 puffs into the lungs every 6 (six) hours as needed for wheezing or shortness of breath. 1 Inhaler 11  . amLODipine (NORVASC) 10 MG tablet TAKE 1 TABLET (10 MG TOTAL) BY MOUTH DAILY. 30 tablet 9  . hydrochlorothiazide (HYDRODIURIL) 25 MG tablet Take 1 tablet (25 mg total) by mouth daily. 90 tablet 2  . acetaminophen (TYLENOL) 500 MG tablet Take 500 mg by mouth every 6 (six) hours as needed.    . cetirizine (ZYRTEC) 10 MG tablet Take 1 tablet (10 mg total) by mouth daily. (Patient not taking: Reported on 11/14/2017) 30 tablet 11  . clonazePAM (KLONOPIN) 1 MG tablet Take 1-2 mg by mouth 3 (three) times daily as needed. Reported on 10/11/2015    . fluticasone (FLONASE) 50 MCG/ACT nasal spray Place 2 sprays into both  nostrils daily. (Patient not taking: Reported on 11/14/2017) 16 g 11  . gabapentin (NEURONTIN) 100 MG capsule Take 1 capsule (100 mg total) by mouth 3 (three) times daily. Or 2 capsules (200 mg) at night. 42 capsule 0  . ipratropium (ATROVENT) 0.06 % nasal spray Place 2 sprays into both nostrils 4 (four) times daily. For up to 5-7 days then stop. (Patient not taking: Reported on 11/14/2017) 15 mL 0  . ondansetron  (ZOFRAN) 4 MG tablet Take 1 tablet (4 mg total) by mouth every 8 (eight) hours as needed for nausea or vomiting. (Patient not taking: Reported on 11/14/2017) 20 tablet 0   No current facility-administered medications for this visit.     OBJECTIVE: Vitals:   11/14/17 1053  BP: 132/72  Pulse: 63  Resp: 18  Temp: (!) 95.1 F (35.1 C)     Body mass index is 31.12 kg/m.    ECOG FS:0 - Asymptomatic  General: Well-developed, well-nourished, no acute distress. Eyes: Pink conjunctiva, anicteric sclera. HEENT: Normocephalic, moist mucous membranes. Lungs: Clear to auscultation bilaterally. Heart: Regular rate and rhythm. No rubs, murmurs, or gallops. Abdomen: Soft, nontender, nondistended. No organomegaly noted, normoactive bowel sounds. Musculoskeletal: No edema, cyanosis, or clubbing. Neuro: Alert, answering all questions appropriately. Cranial nerves grossly intact. Skin: No rashes or petechiae noted. Psych: Normal affect.  LAB RESULTS:  Lab Results  Component Value Date   NA 138 08/17/2016   K 3.6 08/17/2016   CL 101 08/17/2016   CO2 25 08/17/2016   GLUCOSE 81 08/17/2016   BUN 11 08/17/2016   CREATININE 0.88 08/17/2016   CALCIUM 9.4 08/17/2016   PROT 7.3 08/17/2016   ALBUMIN 4.0 08/17/2016   AST 25 08/17/2016   ALT 30 (H) 08/17/2016   ALKPHOS 85 08/17/2016   BILITOT 0.3 08/17/2016   GFRNONAA 64 08/04/2015   GFRAA 74 08/04/2015    Lab Results  Component Value Date   WBC 4.1 11/14/2017   NEUTROABS 2.4 11/14/2017   HGB 13.5 11/14/2017   HCT 39.7 11/14/2017   MCV 85.1 11/14/2017   PLT 63 (L) 11/14/2017     STUDIES: No results found.  ASSESSMENT: Bone marrow biopsy proven ITP.  PLAN:    1. ITP: Bone marrow biopsy performed on July 28, 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 Patient responded to steroids, but the results were not durable. She completed her first round of weekly Rituxan on Aug 30, 2015. This was durable for  approximately one year and she then completed 4 weekly cycles of Rituxan on November 23, 2016. Of note, she will require additional premedications in the future since she had a mild reaction with cycle 1.  Patient's platelet count has trended down once again to 63.  Because of this, we will reinitiate Rituxan.  Return to clinic on November 29, 2017 for further evaluation and consideration of cycle 1 of 4 of weekly Rituxan.    2. Back pain: Continue evaluation and treatment per primary care.  I spent a total of 20 minutes face-to-face with the patient of which greater than 50% of the visit was spent in counseling and coordination of care as detailed above.   Patient expressed understanding and was in agreement with this plan. She also understands that She can call clinic at any time with any questions, concerns, or complaints.   Lloyd Huger, MD   11/16/2017 1:52 PM

## 2017-11-14 ENCOUNTER — Inpatient Hospital Stay (HOSPITAL_BASED_OUTPATIENT_CLINIC_OR_DEPARTMENT_OTHER): Payer: Medicare HMO | Admitting: Oncology

## 2017-11-14 ENCOUNTER — Other Ambulatory Visit: Payer: Self-pay

## 2017-11-14 ENCOUNTER — Inpatient Hospital Stay: Payer: Medicare HMO | Attending: Oncology

## 2017-11-14 VITALS — BP 132/72 | HR 63 | Temp 95.1°F | Resp 18 | Wt 172.9 lb

## 2017-11-14 DIAGNOSIS — D693 Immune thrombocytopenic purpura: Secondary | ICD-10-CM | POA: Diagnosis not present

## 2017-11-14 LAB — CBC WITH DIFFERENTIAL/PLATELET
BASOS PCT: 1 %
Basophils Absolute: 0 10*3/uL (ref 0–0.1)
EOS ABS: 0.2 10*3/uL (ref 0–0.7)
Eosinophils Relative: 5 %
HCT: 39.7 % (ref 35.0–47.0)
HEMOGLOBIN: 13.5 g/dL (ref 12.0–16.0)
LYMPHS ABS: 1.2 10*3/uL (ref 1.0–3.6)
Lymphocytes Relative: 30 %
MCH: 28.9 pg (ref 26.0–34.0)
MCHC: 33.9 g/dL (ref 32.0–36.0)
MCV: 85.1 fL (ref 80.0–100.0)
MONO ABS: 0.3 10*3/uL (ref 0.2–0.9)
MONOS PCT: 7 %
Neutro Abs: 2.4 10*3/uL (ref 1.4–6.5)
Neutrophils Relative %: 57 %
Platelets: 63 10*3/uL — ABNORMAL LOW (ref 150–440)
RBC: 4.66 MIL/uL (ref 3.80–5.20)
RDW: 15.2 % — AB (ref 11.5–14.5)
WBC: 4.1 10*3/uL (ref 3.6–11.0)

## 2017-11-14 NOTE — Progress Notes (Signed)
Here for follow up stated over all feels " good/ blessed"

## 2017-11-20 ENCOUNTER — Ambulatory Visit: Payer: Medicare HMO | Admitting: Oncology

## 2017-11-20 ENCOUNTER — Other Ambulatory Visit: Payer: Medicare HMO

## 2017-11-26 NOTE — Progress Notes (Signed)
[facility]  Telephone:(336) [phone] Fax:(336) [phone]  ID: Jazzlyn Huizenga Leinbach OB: Nov 09, 1955  MR#: [id number]  RCV#:[id number]  Patient Care Team: Mikey College, NP as PCP - General (Nurse Practitioner) Chucky May, MD as Consulting Physician (Psychiatry) Lloyd Huger, MD as Consulting Physician (Oncology)  CHIEF COMPLAINT: ITP  INTERVAL HISTORY: Patient returns to clinic today for further evaluation and consideration of cycle 104 of weekly she currently feels well and is asymptomatic. She does not complain of easy bleeding or bruising.  She does not complain of weakness or fatigue. She continues to have chronic back pain and difficulty sleeping. She denies any recent fevers or illnesses. She has no neurologic complaints. She denies any chest pain or shortness of breath. She has no nausea, vomiting, constipation, or diarrhea. She has no urinary complaints.  Patient offers no specific complaints today.  REVIEW OF SYSTEMS:    Review of Systems  Constitutional: Negative.  Negative for fever, malaise/fatigue and weight loss.  Respiratory: Negative.  Negative for cough and shortness of breath.   Cardiovascular: Negative.  Negative for chest pain and leg swelling.  Gastrointestinal: Negative.  Negative for blood in stool, melena and nausea.  Genitourinary: Negative.  Negative for hematuria.  Musculoskeletal: Positive for back pain. Negative for joint pain.  Skin: Negative.  Negative for rash.  Neurological: Negative for sensory change and weakness.  Endo/Heme/Allergies: Does not bruise/bleed easily.  Psychiatric/Behavioral: Negative.  The patient is not nervous/anxious and does not have insomnia.     As per HPI. Otherwise, a complete review of systems is negative.  PAST MEDICAL HISTORY: Past Medical History:  Diagnosis Date  . Hypertension     PAST SURGICAL HISTORY: Past Surgical History:  Procedure Laterality Date  . ABDOMINAL HYSTERECTOMY     . BREAST BIOPSY Left    age 62 surgical bx neg  . CESAREAN SECTION    . cyst removed     breast, benign    FAMILY HISTORY: Reviewed and unchanged. No reported history of malignancy or chronic disease.     ADVANCED DIRECTIVES:    HEALTH MAINTENANCE: Social History   Tobacco Use  . Smoking status: Current Some Day Smoker    Packs/day: 0.15    Types: Cigarettes  . Smokeless tobacco: Former Systems developer  . Tobacco comment: 1 pack takes 3 weeks to finish   Substance Use Topics  . Alcohol use: No  . Drug use: No    Allergies  Allergen Reactions  . Doxycycline Nausea And Vomiting  . Penicillins Hives    Current Outpatient Medications  Medication Sig Dispense Refill  . acetaminophen (TYLENOL) 500 MG tablet Take 500 mg by mouth every 6 (six) hours as needed.    Marland Kitchen albuterol (PROVENTIL HFA;VENTOLIN HFA) 108 (90 Base) MCG/ACT inhaler Inhale 1-2 puffs into the lungs every 6 (six) hours as needed for wheezing or shortness of breath. 1 Inhaler 11  . amLODipine (NORVASC) 10 MG tablet TAKE 1 TABLET (10 MG TOTAL) BY MOUTH DAILY. 30 tablet 9  . clonazePAM (KLONOPIN) 1 MG tablet Take 1-2 mg by mouth 3 (three) times daily as needed. Reported on 10/11/2015    . fluticasone (FLONASE) 50 MCG/ACT nasal spray Place 2 sprays into both nostrils daily. 16 g 11  . hydrochlorothiazide (HYDRODIURIL) 25 MG tablet Take 1 tablet (25 mg total) by mouth daily. 90 tablet 2  . ipratropium (ATROVENT) 0.06 % nasal spray Place 2 sprays into both nostrils 4 (four) times daily. For up to 5-7 days then  stop. 15 mL 0  . ondansetron (ZOFRAN) 4 MG tablet Take 1 tablet (4 mg total) by mouth every 8 (eight) hours as needed for nausea or vomiting. 20 tablet 0  . cetirizine (ZYRTEC) 10 MG tablet Take 1 tablet (10 mg total) by mouth daily. (Patient not taking: Reported on 11/14/2017) 30 tablet 11  . gabapentin (NEURONTIN) 100 MG capsule Take 1 capsule (100 mg total) by mouth 3 (three) times daily. Or 2 capsules (200 mg) at night. 42  capsule 0  . LORazepam (ATIVAN) 1 MG tablet Take 1 tablet 4 hours prior to MRI.  May repeat 1/2 hour prior to MRI.     No current facility-administered medications for this visit.     OBJECTIVE: Vitals:   11/29/17 0831  BP: 111/78  Pulse: 60  Resp: 18  Temp: (!) 96 F (35.6 C)     Body mass index is 30.65 kg/m.    ECOG FS:0 - Asymptomatic  General: Well-developed, well-nourished, no acute distress. Eyes: Pink conjunctiva, anicteric sclera. HEENT: Normocephalic, moist mucous membranes. Lungs: Clear to auscultation bilaterally. Heart: Regular rate and rhythm. No rubs, murmurs, or gallops. Abdomen: Soft, nontender, nondistended. No organomegaly noted, normoactive bowel sounds. Musculoskeletal: No edema, cyanosis, or clubbing. Neuro: Alert, answering all questions appropriately. Cranial nerves grossly intact. Skin: No rashes or petechiae noted. Psych: Normal affect.  LAB RESULTS:  Lab Results  Component Value Date   NA 138 08/17/2016   K 3.6 08/17/2016   CL 101 08/17/2016   CO2 25 08/17/2016   GLUCOSE 81 08/17/2016   BUN 11 08/17/2016   CREATININE 0.88 08/17/2016   CALCIUM 9.4 08/17/2016   PROT 7.3 08/17/2016   ALBUMIN 4.0 08/17/2016   AST 25 08/17/2016   ALT 30 (H) 08/17/2016   ALKPHOS 85 08/17/2016   BILITOT 0.3 08/17/2016   GFRNONAA 64 08/04/2015   GFRAA 74 08/04/2015    Lab Results  Component Value Date   WBC 4.8 11/29/2017   NEUTROABS 3.0 11/29/2017   HGB 14.9 11/29/2017   HCT 43.4 11/29/2017   MCV 85.4 11/29/2017   PLT 63 (L) 11/29/2017     STUDIES: No results found.  ASSESSMENT: Bone marrow biopsy proven ITP.  PLAN:    1. ITP: Bone marrow biopsy performed on July 28, 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 Patient responded to steroids, but the results were not durable. She completed her first round of weekly Rituxan on Aug 30, 2015. This was durable for approximately one year and she then completed 4 weekly cycles of  Rituxan on November 23, 2016. Of note, she will require additional premedications in the future since she had a mild reaction with cycle 1.  Patient's platelet count continues to be decreased and is 63 today.  Proceed with cycle 1 of 4 of weekly Rituxan.  Return to clinic in 1 week for further evaluation and consideration of cycle 2.   2. Back pain: Continue evaluation and treatment per primary care.  I spent a total of 30 minutes face-to-face with the patient of which greater than 50% of the visit was spent in counseling and coordination of care as detailed above.  Patient expressed understanding and was in agreement with this plan. She also understands that She can call clinic at any time with any questions, concerns, or complaints.   Lloyd Huger, MD   11/30/2017 6:15 PM

## 2017-11-27 ENCOUNTER — Other Ambulatory Visit: Payer: Self-pay | Admitting: *Deleted

## 2017-11-27 DIAGNOSIS — D693 Immune thrombocytopenic purpura: Secondary | ICD-10-CM

## 2017-11-29 ENCOUNTER — Inpatient Hospital Stay (HOSPITAL_BASED_OUTPATIENT_CLINIC_OR_DEPARTMENT_OTHER): Payer: Medicare HMO | Admitting: Oncology

## 2017-11-29 ENCOUNTER — Encounter: Payer: Self-pay | Admitting: Oncology

## 2017-11-29 ENCOUNTER — Inpatient Hospital Stay: Payer: Medicare HMO

## 2017-11-29 ENCOUNTER — Other Ambulatory Visit: Payer: Self-pay

## 2017-11-29 ENCOUNTER — Inpatient Hospital Stay: Payer: Medicare HMO | Attending: Oncology

## 2017-11-29 VITALS — BP 111/78 | HR 60 | Temp 96.0°F | Resp 18 | Wt 170.3 lb

## 2017-11-29 VITALS — BP 125/77 | HR 62 | Resp 20

## 2017-11-29 DIAGNOSIS — I10 Essential (primary) hypertension: Secondary | ICD-10-CM | POA: Insufficient documentation

## 2017-11-29 DIAGNOSIS — G8929 Other chronic pain: Secondary | ICD-10-CM | POA: Diagnosis not present

## 2017-11-29 DIAGNOSIS — F17210 Nicotine dependence, cigarettes, uncomplicated: Secondary | ICD-10-CM

## 2017-11-29 DIAGNOSIS — M549 Dorsalgia, unspecified: Secondary | ICD-10-CM

## 2017-11-29 DIAGNOSIS — Z79899 Other long term (current) drug therapy: Secondary | ICD-10-CM | POA: Insufficient documentation

## 2017-11-29 DIAGNOSIS — D693 Immune thrombocytopenic purpura: Secondary | ICD-10-CM

## 2017-11-29 DIAGNOSIS — R69 Illness, unspecified: Secondary | ICD-10-CM | POA: Diagnosis not present

## 2017-11-29 LAB — CBC WITH DIFFERENTIAL/PLATELET
BASOS ABS: 0 10*3/uL (ref 0–0.1)
BASOS PCT: 1 %
EOS PCT: 7 %
Eosinophils Absolute: 0.3 10*3/uL (ref 0–0.7)
HCT: 43.4 % (ref 35.0–47.0)
Hemoglobin: 14.9 g/dL (ref 12.0–16.0)
Lymphocytes Relative: 23 %
Lymphs Abs: 1.1 10*3/uL (ref 1.0–3.6)
MCH: 29.3 pg (ref 26.0–34.0)
MCHC: 34.3 g/dL (ref 32.0–36.0)
MCV: 85.4 fL (ref 80.0–100.0)
MONO ABS: 0.3 10*3/uL (ref 0.2–0.9)
Monocytes Relative: 7 %
Neutro Abs: 3 10*3/uL (ref 1.4–6.5)
Neutrophils Relative %: 62 %
PLATELETS: 63 10*3/uL — AB (ref 150–440)
RBC: 5.09 MIL/uL (ref 3.80–5.20)
RDW: 15.2 % — AB (ref 11.5–14.5)
WBC: 4.8 10*3/uL (ref 3.6–11.0)

## 2017-11-29 MED ORDER — SODIUM CHLORIDE 0.9 % IV SOLN: 10.0000 mg | Freq: Once | INTRAVENOUS | Status: DC

## 2017-11-29 MED ORDER — DEXAMETHASONE SODIUM PHOSPHATE 10 MG/ML IJ SOLN
10.0000 mg | Freq: Once | INTRAMUSCULAR | Status: AC
  Administered 2017-11-29: 10 mg via INTRAVENOUS
  Filled 2017-11-29: qty 1

## 2017-11-29 MED ORDER — METHYLPREDNISOLONE SODIUM SUCC 125 MG IJ SOLR
125.0000 mg | Freq: Once | INTRAMUSCULAR | Status: AC | PRN
  Administered 2017-11-29: 125 mg via INTRAVENOUS

## 2017-11-29 MED ORDER — SODIUM CHLORIDE 0.9 % IV SOLN
Freq: Once | INTRAVENOUS | Status: AC
  Administered 2017-11-29: 10:00:00 via INTRAVENOUS
  Filled 2017-11-29: qty 1000

## 2017-11-29 MED ORDER — ACETAMINOPHEN 325 MG PO TABS
650.0000 mg | ORAL_TABLET | Freq: Once | ORAL | Status: AC
  Administered 2017-11-29: 650 mg via ORAL
  Filled 2017-11-29: qty 2

## 2017-11-29 MED ORDER — DIPHENHYDRAMINE HCL 50 MG/ML IJ SOLN
25.0000 mg | Freq: Once | INTRAMUSCULAR | Status: AC | PRN
  Administered 2017-11-29: 25 mg via INTRAVENOUS

## 2017-11-29 MED ORDER — FAMOTIDINE IN NACL 20-0.9 MG/50ML-% IV SOLN
20.0000 mg | Freq: Two times a day (BID) | INTRAVENOUS | Status: DC
  Administered 2017-11-29: 20 mg via INTRAVENOUS
  Filled 2017-11-29: qty 50

## 2017-11-29 MED ORDER — DIPHENHYDRAMINE HCL 25 MG PO CAPS
25.0000 mg | ORAL_CAPSULE | Freq: Once | ORAL | Status: AC
Start: 2017-11-29 — End: 2017-11-29
  Administered 2017-11-29: 25 mg via ORAL
  Filled 2017-11-29: qty 1

## 2017-11-29 MED ORDER — SODIUM CHLORIDE 0.9 % IV SOLN
375.0000 mg/m2 | Freq: Once | INTRAVENOUS | Status: AC
  Administered 2017-11-29: 700 mg via INTRAVENOUS
  Filled 2017-11-29: qty 50

## 2017-11-29 NOTE — Progress Notes (Signed)
Patient here for follow up. Patient concerned about 2 moles, one one her upper chest and one on her back. She states within the last year they have gotten bigger.

## 2017-11-29 NOTE — Progress Notes (Signed)
1220-Patient on 3rd rate increase of Rituxan. Started c/o of burning in lower abdomen/bladder and itchy throat. Rituxan stopped, 25mg  IV Benadryl and 125mg  Solu-Medrol IV given. VSS. Beckey Rutter NP at chairside. No additional orders given.  1255-Patient states symptoms resolved. Rituxan restarted. Will continue to monitor

## 2017-12-02 NOTE — Progress Notes (Signed)
[place]  Telephone:(336) [phone] Fax:(336) [phone]  ID: Erica Bartlett OB: 1955/04/25  MR#: [id number]  ZMO#:[id number]  Patient Care Team: Mikey College, NP as PCP - General (Nurse Practitioner) Chucky May, MD as Consulting Physician (Psychiatry) Lloyd Huger, MD as Consulting Physician (Oncology)  CHIEF COMPLAINT: ITP  INTERVAL HISTORY: Patient returns to clinic today for further evaluation and consideration of cycle 2 of 4 of weekly Rituxan.  She had a mild infusional reaction with her first cycle, but otherwise tolerated well.  She has noticed some mild increased fatigue, but otherwise feels well and is asymptomatic. She does not complain of easy bleeding or bruising.  She continues to have chronic back pain and difficulty sleeping. She denies any recent fevers or illnesses. She has no neurologic complaints. She denies any chest pain or shortness of breath. She has no nausea, vomiting, constipation, or diarrhea. She has no urinary complaints.  Patient offers no further specific complaints today.  REVIEW OF SYSTEMS:    Review of Systems  Constitutional: Positive for malaise/fatigue. Negative for fever and weight loss.  Respiratory: Negative.  Negative for cough and shortness of breath.   Cardiovascular: Negative.  Negative for chest pain and leg swelling.  Gastrointestinal: Negative.  Negative for blood in stool, melena and nausea.  Genitourinary: Negative.  Negative for hematuria.  Musculoskeletal: Positive for back pain. Negative for joint pain.  Skin: Negative.  Negative for rash.  Neurological: Negative.  Negative for sensory change, focal weakness, weakness and headaches.  Endo/Heme/Allergies: Does not bruise/bleed easily.  Psychiatric/Behavioral: Negative.  The patient is not nervous/anxious and does not have insomnia.     As per HPI. Otherwise, a complete review of systems is negative.  PAST MEDICAL HISTORY: Past Medical  History:  Diagnosis Date  . Hypertension     PAST SURGICAL HISTORY: Past Surgical History:  Procedure Laterality Date  . ABDOMINAL HYSTERECTOMY    . BREAST BIOPSY Left    age 89 surgical bx neg  . CESAREAN SECTION    . cyst removed     breast, benign    FAMILY HISTORY: Reviewed and unchanged. No reported history of malignancy or chronic disease.     ADVANCED DIRECTIVES:    HEALTH MAINTENANCE: Social History   Tobacco Use  . Smoking status: Current Some Day Smoker    Packs/day: 0.15    Types: Cigarettes  . Smokeless tobacco: Former Systems developer  . Tobacco comment: 1 pack takes 3 weeks to finish   Substance Use Topics  . Alcohol use: No  . Drug use: No    Allergies  Allergen Reactions  . Doxycycline Nausea And Vomiting  . Penicillins Hives    Current Outpatient Medications  Medication Sig Dispense Refill  . acetaminophen (TYLENOL) 500 MG tablet Take 500 mg by mouth every 6 (six) hours as needed.    Marland Kitchen albuterol (PROVENTIL HFA;VENTOLIN HFA) 108 (90 Base) MCG/ACT inhaler Inhale 1-2 puffs into the lungs every 6 (six) hours as needed for wheezing or shortness of breath. 1 Inhaler 11  . amLODipine (NORVASC) 10 MG tablet TAKE 1 TABLET (10 MG TOTAL) BY MOUTH DAILY. 30 tablet 9  . cetirizine (ZYRTEC) 10 MG tablet Take 1 tablet (10 mg total) by mouth daily. 30 tablet 11  . clonazePAM (KLONOPIN) 1 MG tablet Take 1-2 mg by mouth 3 (three) times daily as needed. Reported on 10/11/2015    . fluticasone (FLONASE) 50 MCG/ACT nasal spray Place 2 sprays into both nostrils daily. 16 g 11  .  hydrochlorothiazide (HYDRODIURIL) 25 MG tablet Take 1 tablet (25 mg total) by mouth daily. 90 tablet 2  . ipratropium (ATROVENT) 0.06 % nasal spray Place 2 sprays into both nostrils 4 (four) times daily. For up to 5-7 days then stop. 15 mL 0  . LORazepam (ATIVAN) 1 MG tablet Take 1 tablet 4 hours prior to MRI.  May repeat 1/2 hour prior to MRI.    Marland Kitchen ondansetron (ZOFRAN) 4 MG tablet Take 1 tablet (4 mg  total) by mouth every 8 (eight) hours as needed for nausea or vomiting. 20 tablet 0   No current facility-administered medications for this visit.     OBJECTIVE: Vitals:   12/06/17 0849 12/06/17 0855  BP:  135/80  Pulse:  62  Resp: 16   Temp:  97.9 F (36.6 C)     Body mass index is 30.13 kg/m.    ECOG FS:0 - Asymptomatic  General: Well-developed, well-nourished, no acute distress. Eyes: Pink conjunctiva, anicteric sclera. HEENT: Normocephalic, moist mucous membranes. Lungs: Clear to auscultation bilaterally. Heart: Regular rate and rhythm. No rubs, murmurs, or gallops. Abdomen: Soft, nontender, nondistended. No organomegaly noted, normoactive bowel sounds. Musculoskeletal: No edema, cyanosis, or clubbing. Neuro: Alert, answering all questions appropriately. Cranial nerves grossly intact. Skin: No rashes or petechiae noted. Psych: Normal affect.  LAB RESULTS:  Lab Results  Component Value Date   NA 138 08/17/2016   K 3.6 08/17/2016   CL 101 08/17/2016   CO2 25 08/17/2016   GLUCOSE 81 08/17/2016   BUN 11 08/17/2016   CREATININE 0.88 08/17/2016   CALCIUM 9.4 08/17/2016   PROT 7.3 08/17/2016   ALBUMIN 4.0 08/17/2016   AST 25 08/17/2016   ALT 30 (H) 08/17/2016   ALKPHOS 85 08/17/2016   BILITOT 0.3 08/17/2016   GFRNONAA 64 08/04/2015   GFRAA 74 08/04/2015    Lab Results  Component Value Date   WBC 7.8 12/06/2017   NEUTROABS 5.4 12/06/2017   HGB 15.2 12/06/2017   HCT 43.4 12/06/2017   MCV 84.8 12/06/2017   PLT 109 (L) 12/06/2017     STUDIES: No results found.  ASSESSMENT: Bone marrow biopsy proven ITP.  PLAN:    1. ITP: Bone marrow biopsy performed on July 28, 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 Patient responded to steroids, but the results were not durable. She completed her first round of weekly Rituxan on Aug 30, 2015. This was durable for approximately one year and she then completed 4 weekly cycles of Rituxan on  November 23, 2016. Of note, she will require additional premedications in the future since she had a mild reaction with cycle 1.  Platelet count continues to increase and is 109 today.  Proceed with cycle 2 of 4 of weekly Rituxan.  Return to clinic in 1 week for further evaluation and consideration of cycle 3. 2.  Fatigue: Possibly related to Rituxan, monitor. 3.  Back pain: Continue evaluation and treatment per primary care.  I spent a total of 30 minutes face-to-face with the patient of which greater than 50% of the visit was spent in counseling and coordination of care as detailed above.  Patient expressed understanding and was in agreement with this plan. She also understands that She can call clinic at any time with any questions, concerns, or complaints.   Lloyd Huger, MD   12/10/2017 8:54 AM

## 2017-12-06 ENCOUNTER — Other Ambulatory Visit: Payer: Self-pay

## 2017-12-06 ENCOUNTER — Encounter: Payer: Self-pay | Admitting: Oncology

## 2017-12-06 ENCOUNTER — Inpatient Hospital Stay (HOSPITAL_BASED_OUTPATIENT_CLINIC_OR_DEPARTMENT_OTHER): Payer: Medicare HMO | Admitting: Oncology

## 2017-12-06 ENCOUNTER — Inpatient Hospital Stay: Payer: Medicare HMO

## 2017-12-06 VITALS — BP 135/80 | HR 62 | Temp 97.9°F | Resp 16 | Ht 63.0 in | Wt 170.1 lb

## 2017-12-06 DIAGNOSIS — M549 Dorsalgia, unspecified: Secondary | ICD-10-CM | POA: Diagnosis not present

## 2017-12-06 DIAGNOSIS — G8929 Other chronic pain: Secondary | ICD-10-CM

## 2017-12-06 DIAGNOSIS — Z79899 Other long term (current) drug therapy: Secondary | ICD-10-CM

## 2017-12-06 DIAGNOSIS — R69 Illness, unspecified: Secondary | ICD-10-CM | POA: Diagnosis not present

## 2017-12-06 DIAGNOSIS — D693 Immune thrombocytopenic purpura: Secondary | ICD-10-CM

## 2017-12-06 DIAGNOSIS — F17210 Nicotine dependence, cigarettes, uncomplicated: Secondary | ICD-10-CM

## 2017-12-06 LAB — CBC WITH DIFFERENTIAL/PLATELET
BASOS ABS: 0.1 10*3/uL (ref 0–0.1)
Basophils Relative: 1 %
Eosinophils Absolute: 0.3 10*3/uL (ref 0–0.7)
Eosinophils Relative: 4 %
HEMATOCRIT: 43.4 % (ref 35.0–47.0)
HEMOGLOBIN: 15.2 g/dL (ref 12.0–16.0)
LYMPHS PCT: 18 %
Lymphs Abs: 1.4 10*3/uL (ref 1.0–3.6)
MCH: 29.6 pg (ref 26.0–34.0)
MCHC: 34.9 g/dL (ref 32.0–36.0)
MCV: 84.8 fL (ref 80.0–100.0)
MONOS PCT: 7 %
Monocytes Absolute: 0.6 10*3/uL (ref 0.2–0.9)
NEUTROS ABS: 5.4 10*3/uL (ref 1.4–6.5)
NEUTROS PCT: 70 %
Platelets: 109 10*3/uL — ABNORMAL LOW (ref 150–440)
RBC: 5.12 MIL/uL (ref 3.80–5.20)
RDW: 15.2 % — AB (ref 11.5–14.5)
WBC: 7.8 10*3/uL (ref 3.6–11.0)

## 2017-12-06 MED ORDER — DIPHENHYDRAMINE HCL 50 MG/ML IJ SOLN
25.0000 mg | Freq: Once | INTRAMUSCULAR | Status: AC
  Administered 2017-12-06: 25 mg via INTRAVENOUS
  Filled 2017-12-06: qty 1

## 2017-12-06 MED ORDER — SODIUM CHLORIDE 0.9 % IV SOLN
375.0000 mg/m2 | Freq: Once | INTRAVENOUS | Status: AC
  Administered 2017-12-06: 700 mg via INTRAVENOUS
  Filled 2017-12-06: qty 50

## 2017-12-06 MED ORDER — FAMOTIDINE IN NACL 20-0.9 MG/50ML-% IV SOLN
20.0000 mg | Freq: Two times a day (BID) | INTRAVENOUS | Status: DC
  Administered 2017-12-06: 20 mg via INTRAVENOUS
  Filled 2017-12-06: qty 50

## 2017-12-06 MED ORDER — SODIUM CHLORIDE 0.9 % IV SOLN
Freq: Once | INTRAVENOUS | Status: AC
  Administered 2017-12-06: 10:00:00 via INTRAVENOUS
  Filled 2017-12-06: qty 1000

## 2017-12-06 MED ORDER — ACETAMINOPHEN 325 MG PO TABS
650.0000 mg | ORAL_TABLET | Freq: Once | ORAL | Status: AC
  Administered 2017-12-06: 650 mg via ORAL
  Filled 2017-12-06: qty 2

## 2017-12-06 MED ORDER — SODIUM CHLORIDE 0.9 % IV SOLN: 375.0000 mg/m2 | Freq: Once | INTRAVENOUS | Status: DC

## 2017-12-06 MED ORDER — DEXAMETHASONE SODIUM PHOSPHATE 10 MG/ML IJ SOLN
10.0000 mg | Freq: Once | INTRAMUSCULAR | Status: AC
  Administered 2017-12-06: 10 mg via INTRAVENOUS
  Filled 2017-12-06: qty 1

## 2017-12-06 NOTE — Progress Notes (Signed)
Patient had reaction to Rituxan last time, will run as first time again per RN

## 2017-12-06 NOTE — Progress Notes (Signed)
Patient here for pretreatment check. She is still feeling nauseous

## 2017-12-06 NOTE — Progress Notes (Signed)
Patient tolerated Rituxan infusion without any complications

## 2017-12-11 NOTE — Progress Notes (Signed)
[place]  Telephone:(336) [phone] Fax:(336) [phone]  ID: Erica Bartlett OB: [birth year]  MR#: [id number]  DJM#:[id number]  Patient Care Team: Mikey College, NP as PCP - General (Nurse Practitioner) Chucky May, MD as Consulting Physician (Psychiatry) Lloyd Huger, MD as Consulting Physician (Oncology)  CHIEF COMPLAINT: ITP  INTERVAL HISTORY: Patient returns to clinic today for further evaluation and consideration of cycle 3 of 4 of weekly Rituxan.  She has noticed increasing fatigue this week, but otherwise feels well. She does not complain of easy bleeding or bruising.  She continues to have chronic back pain and difficulty sleeping. She denies any recent fevers or illnesses. She has no neurologic complaints. She denies any chest pain or shortness of breath. She has no nausea, vomiting, constipation, or diarrhea. She has no urinary complaints.  Patient offers no further specific complaints today.  REVIEW OF SYSTEMS:    Review of Systems  Constitutional: Positive for malaise/fatigue. Negative for fever and weight loss.  Respiratory: Negative.  Negative for cough and shortness of breath.   Cardiovascular: Negative.  Negative for chest pain and leg swelling.  Gastrointestinal: Negative.  Negative for blood in stool, melena and nausea.  Genitourinary: Negative.  Negative for hematuria.  Musculoskeletal: Positive for back pain. Negative for joint pain.  Skin: Negative.  Negative for rash.  Neurological: Negative.  Negative for sensory change, focal weakness, weakness and headaches.  Endo/Heme/Allergies: Does not bruise/bleed easily.  Psychiatric/Behavioral: Negative.  The patient is not nervous/anxious and does not have insomnia.     As per HPI. Otherwise, a complete review of systems is negative.  PAST MEDICAL HISTORY: Past Medical History:  Diagnosis Date  . Hypertension     PAST SURGICAL HISTORY: Past Surgical History:  Procedure  Laterality Date  . ABDOMINAL HYSTERECTOMY    . BREAST BIOPSY Left    age 59 surgical bx neg  . CESAREAN SECTION    . cyst removed     breast, benign    FAMILY HISTORY: Reviewed and unchanged. No reported history of malignancy or chronic disease.     ADVANCED DIRECTIVES:    HEALTH MAINTENANCE: Social History   Tobacco Use  . Smoking status: Current Some Day Smoker    Packs/day: 0.15    Types: Cigarettes  . Smokeless tobacco: Former Systems developer  . Tobacco comment: 1 pack takes 3 weeks to finish   Substance Use Topics  . Alcohol use: No  . Drug use: No    Allergies  Allergen Reactions  . Doxycycline Nausea And Vomiting  . Penicillins Hives    Current Outpatient Medications  Medication Sig Dispense Refill  . amLODipine (NORVASC) 10 MG tablet TAKE 1 TABLET (10 MG TOTAL) BY MOUTH DAILY. 30 tablet 9  . clonazePAM (KLONOPIN) 1 MG tablet Take 1-2 mg by mouth 3 (three) times daily as needed. Reported on 10/11/2015    . fluticasone (FLONASE) 50 MCG/ACT nasal spray Place 2 sprays into both nostrils daily. 16 g 11  . hydrochlorothiazide (HYDRODIURIL) 25 MG tablet Take 1 tablet (25 mg total) by mouth daily. 90 tablet 2  . acetaminophen (TYLENOL) 500 MG tablet Take 500 mg by mouth every 6 (six) hours as needed.    Marland Kitchen albuterol (PROVENTIL HFA;VENTOLIN HFA) 108 (90 Base) MCG/ACT inhaler Inhale 1-2 puffs into the lungs every 6 (six) hours as needed for wheezing or shortness of breath. (Patient not taking: Reported on 12/13/2017) 1 Inhaler 11  . cetirizine (ZYRTEC) 10 MG tablet Take 1 tablet (10 mg  total) by mouth daily. (Patient not taking: Reported on 12/13/2017) 30 tablet 11  . ipratropium (ATROVENT) 0.06 % nasal spray Place 2 sprays into both nostrils 4 (four) times daily. For up to 5-7 days then stop. (Patient not taking: Reported on 12/13/2017) 15 mL 0  . ondansetron (ZOFRAN) 4 MG tablet Take 1 tablet (4 mg total) by mouth every 8 (eight) hours as needed for nausea or vomiting. 20 tablet 0   No  current facility-administered medications for this visit.    Facility-Administered Medications Ordered in Other Visits  Medication Dose Route Frequency Provider Last Rate Last Dose  . famotidine (PEPCID) IVPB 20 mg premix  20 mg Intravenous Q12H Lloyd Huger, MD   Stopped at 12/13/17 1109    OBJECTIVE: Vitals:   12/13/17 0828  BP: (!) 146/80  Pulse: 62  Resp: 18  Temp: (!) 94.5 F (34.7 C)     Body mass index is 30.24 kg/m.    ECOG FS:0 - Asymptomatic  General: Well-developed, well-nourished, no acute distress. Eyes: Pink conjunctiva, anicteric sclera. HEENT: Normocephalic, moist mucous membranes. Lungs: Clear to auscultation bilaterally. Heart: Regular rate and rhythm. No rubs, murmurs, or gallops. Abdomen: Soft, nontender, nondistended. No organomegaly noted, normoactive bowel sounds. Musculoskeletal: No edema, cyanosis, or clubbing. Neuro: Alert, answering all questions appropriately. Cranial nerves grossly intact. Skin: No rashes or petechiae noted. Psych: Normal affect.  LAB RESULTS:  Lab Results  Component Value Date   NA 138 08/17/2016   K 3.6 08/17/2016   CL 101 08/17/2016   CO2 25 08/17/2016   GLUCOSE 81 08/17/2016   BUN 11 08/17/2016   CREATININE 0.88 08/17/2016   CALCIUM 9.4 08/17/2016   PROT 7.3 08/17/2016   ALBUMIN 4.0 08/17/2016   AST 25 08/17/2016   ALT 30 (H) 08/17/2016   ALKPHOS 85 08/17/2016   BILITOT 0.3 08/17/2016   GFRNONAA 64 08/04/2015   GFRAA 74 08/04/2015    Lab Results  Component Value Date   WBC 7.1 12/13/2017   NEUTROABS 5.1 12/13/2017   HGB 14.2 12/13/2017   HCT 40.4 12/13/2017   MCV 84.5 12/13/2017   PLT 143 (L) 12/13/2017     STUDIES: No results found.  ASSESSMENT: Bone marrow biopsy proven ITP.  PLAN:    1. ITP: Bone marrow biopsy performed on July 28, 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 Patient responded to steroids, but the results were not durable. She completed her first  round of weekly Rituxan on Aug 30, 2015. This was durable for approximately one year and she then completed 4 weekly cycles of Rituxan on November 23, 2016. Of note, she will require additional premedications in the future since she had a mild reaction with cycle 1.  Platelet count continues to trend up and is 143 today.  Proceed with cycle 3 of 4 of weekly Rituxan.  Return to clinic in 1 week for further evaluation and consideration of cycle 4.   2.  Fatigue: Possibly related to Rituxan, monitor. 3.  Back pain: Continue evaluation and treatment per primary care.  I spent a total of 30 minutes face-to-face with the patient of which greater than 50% of the visit was spent in counseling and coordination of care as detailed above.  Patient expressed understanding and was in agreement with this plan. She also understands that She can call clinic at any time with any questions, concerns, or complaints.   Lloyd Huger, MD   12/13/2017 5:41 PM

## 2017-12-13 ENCOUNTER — Inpatient Hospital Stay (HOSPITAL_BASED_OUTPATIENT_CLINIC_OR_DEPARTMENT_OTHER): Payer: Medicare HMO | Admitting: Oncology

## 2017-12-13 ENCOUNTER — Inpatient Hospital Stay: Payer: Medicare HMO

## 2017-12-13 ENCOUNTER — Other Ambulatory Visit: Payer: Self-pay

## 2017-12-13 VITALS — BP 119/78 | HR 70 | Resp 18

## 2017-12-13 VITALS — BP 146/80 | HR 62 | Temp 94.5°F | Resp 18 | Wt 170.7 lb

## 2017-12-13 DIAGNOSIS — M549 Dorsalgia, unspecified: Secondary | ICD-10-CM | POA: Diagnosis not present

## 2017-12-13 DIAGNOSIS — R69 Illness, unspecified: Secondary | ICD-10-CM | POA: Diagnosis not present

## 2017-12-13 DIAGNOSIS — F17210 Nicotine dependence, cigarettes, uncomplicated: Secondary | ICD-10-CM

## 2017-12-13 DIAGNOSIS — Z79899 Other long term (current) drug therapy: Secondary | ICD-10-CM | POA: Diagnosis not present

## 2017-12-13 DIAGNOSIS — D693 Immune thrombocytopenic purpura: Secondary | ICD-10-CM | POA: Diagnosis not present

## 2017-12-13 DIAGNOSIS — G8929 Other chronic pain: Secondary | ICD-10-CM

## 2017-12-13 LAB — CBC WITH DIFFERENTIAL/PLATELET
BASOS PCT: 1 %
Basophils Absolute: 0.1 10*3/uL (ref 0–0.1)
EOS PCT: 5 %
Eosinophils Absolute: 0.3 10*3/uL (ref 0–0.7)
HEMATOCRIT: 40.4 % (ref 35.0–47.0)
Hemoglobin: 14.2 g/dL (ref 12.0–16.0)
LYMPHS PCT: 17 %
Lymphs Abs: 1.2 10*3/uL (ref 1.0–3.6)
MCH: 29.8 pg (ref 26.0–34.0)
MCHC: 35.2 g/dL (ref 32.0–36.0)
MCV: 84.5 fL (ref 80.0–100.0)
MONO ABS: 0.4 10*3/uL (ref 0.2–0.9)
MONOS PCT: 5 %
NEUTROS ABS: 5.1 10*3/uL (ref 1.4–6.5)
Neutrophils Relative %: 72 %
PLATELETS: 143 10*3/uL — AB (ref 150–440)
RBC: 4.78 MIL/uL (ref 3.80–5.20)
RDW: 15 % — AB (ref 11.5–14.5)
WBC: 7.1 10*3/uL (ref 3.6–11.0)

## 2017-12-13 MED ORDER — ACETAMINOPHEN 325 MG PO TABS
650.0000 mg | ORAL_TABLET | Freq: Once | ORAL | Status: AC
  Administered 2017-12-13: 650 mg via ORAL
  Filled 2017-12-13: qty 2

## 2017-12-13 MED ORDER — DEXAMETHASONE SODIUM PHOSPHATE 10 MG/ML IJ SOLN
10.0000 mg | Freq: Once | INTRAMUSCULAR | Status: AC
  Administered 2017-12-13: 10 mg via INTRAVENOUS
  Filled 2017-12-13: qty 1

## 2017-12-13 MED ORDER — SODIUM CHLORIDE 0.9 % IV SOLN
Freq: Once | INTRAVENOUS | Status: AC
  Administered 2017-12-13: 10:00:00 via INTRAVENOUS
  Filled 2017-12-13: qty 250

## 2017-12-13 MED ORDER — DIPHENHYDRAMINE HCL 50 MG/ML IJ SOLN
25.0000 mg | Freq: Once | INTRAMUSCULAR | Status: AC
  Administered 2017-12-13: 25 mg via INTRAVENOUS
  Filled 2017-12-13: qty 1

## 2017-12-13 MED ORDER — FAMOTIDINE IN NACL 20-0.9 MG/50ML-% IV SOLN
20.0000 mg | Freq: Two times a day (BID) | INTRAVENOUS | Status: DC
  Administered 2017-12-13: 20 mg via INTRAVENOUS
  Filled 2017-12-13: qty 50

## 2017-12-13 MED ORDER — SODIUM CHLORIDE 0.9 % IV SOLN
375.0000 mg/m2 | Freq: Once | INTRAVENOUS | Status: AC
  Administered 2017-12-13: 700 mg via INTRAVENOUS
  Filled 2017-12-13: qty 50

## 2017-12-13 MED ORDER — ONDANSETRON HCL 4 MG PO TABS: 4.0000 mg | ORAL_TABLET | Freq: Three times a day (TID) | ORAL | 0 refills | Status: DC | PRN

## 2017-12-13 NOTE — Progress Notes (Signed)
Here for follow up. Overall stated achy all over from fibromyalgia. Stated nausea -and needs Zofran refill. No vomiting per pt.

## 2017-12-16 NOTE — Progress Notes (Signed)
[place]  Telephone:(336) [phone] Fax:(336) [phone]  ID: Erica Bartlett Sunday OB: 1956-01-19  MR#: [id number]  ZMO#:[id number]  Patient Care Team: Mikey College, NP as PCP - General (Nurse Practitioner) Chucky May, MD as Consulting Physician (Psychiatry) Lloyd Huger, MD as Consulting Physician (Oncology)  CHIEF COMPLAINT: ITP  INTERVAL HISTORY: Patient returns to clinic today for further evaluation and consideration of cycle 4 of 4 of weekly Rituxan.  She continues to have fatigue, but otherwise is tolerating her infusions well.  She does not complain of easy bleeding or bruising.  She continues to have chronic back pain and difficulty sleeping. She denies any recent fevers or illnesses. She has no neurologic complaints. She denies any chest pain or shortness of breath. She has no nausea, vomiting, constipation, or diarrhea. She has no urinary complaints.  Patient offers no further specific complaints today.    REVIEW OF SYSTEMS:    Review of Systems  Constitutional: Positive for malaise/fatigue. Negative for fever and weight loss.  Respiratory: Negative.  Negative for cough and shortness of breath.   Cardiovascular: Negative.  Negative for chest pain and leg swelling.  Gastrointestinal: Negative.  Negative for blood in stool, melena and nausea.  Genitourinary: Negative.  Negative for hematuria.  Musculoskeletal: Positive for back pain. Negative for joint pain.  Skin: Negative.  Negative for rash.  Neurological: Negative.  Negative for sensory change, focal weakness, weakness and headaches.  Endo/Heme/Allergies: Does not bruise/bleed easily.  Psychiatric/Behavioral: Negative.  The patient is not nervous/anxious and does not have insomnia.     As per HPI. Otherwise, a complete review of systems is negative.  PAST MEDICAL HISTORY: Past Medical History:  Diagnosis Date  . Hypertension     PAST SURGICAL HISTORY: Past Surgical History:    Procedure Laterality Date  . ABDOMINAL HYSTERECTOMY    . BREAST BIOPSY Left    age 6 surgical bx neg  . CESAREAN SECTION    . cyst removed     breast, benign    FAMILY HISTORY: Reviewed and unchanged. No reported history of malignancy or chronic disease.     ADVANCED DIRECTIVES:    HEALTH MAINTENANCE: Social History   Tobacco Use  . Smoking status: Current Some Day Smoker    Packs/day: 0.15    Types: Cigarettes  . Smokeless tobacco: Former Systems developer  . Tobacco comment: 1 pack takes 3 weeks to finish   Substance Use Topics  . Alcohol use: No  . Drug use: No    Allergies  Allergen Reactions  . Doxycycline Nausea And Vomiting  . Penicillins Hives    Current Outpatient Medications  Medication Sig Dispense Refill  . acetaminophen (TYLENOL) 500 MG tablet Take 500 mg by mouth every 6 (six) hours as needed.    Marland Kitchen amLODipine (NORVASC) 10 MG tablet TAKE 1 TABLET (10 MG TOTAL) BY MOUTH DAILY. 30 tablet 9  . clonazePAM (KLONOPIN) 1 MG tablet Take 1-2 mg by mouth 3 (three) times daily as needed. Reported on 10/11/2015    . fluticasone (FLONASE) 50 MCG/ACT nasal spray Place 2 sprays into both nostrils daily. 16 g 11  . hydrochlorothiazide (HYDRODIURIL) 25 MG tablet Take 1 tablet (25 mg total) by mouth daily. 90 tablet 2  . ipratropium (ATROVENT) 0.06 % nasal spray Place 2 sprays into both nostrils 4 (four) times daily. For up to 5-7 days then stop. 15 mL 0  . ondansetron (ZOFRAN) 4 MG tablet Take 1 tablet (4 mg total) by mouth every 8 (  eight) hours as needed for nausea or vomiting. 20 tablet 0  . albuterol (PROVENTIL HFA;VENTOLIN HFA) 108 (90 Base) MCG/ACT inhaler Inhale 1-2 puffs into the lungs every 6 (six) hours as needed for wheezing or shortness of breath. (Patient not taking: Reported on 12/13/2017) 1 Inhaler 11  . cetirizine (ZYRTEC) 10 MG tablet Take 1 tablet (10 mg total) by mouth daily. (Patient not taking: Reported on 12/13/2017) 30 tablet 11   No current facility-administered  medications for this visit.     OBJECTIVE: Vitals:   12/20/17 0815  BP: 121/85  Pulse: 64  Resp: 20  Temp: 97.9 F (36.6 C)     Body mass index is 30.47 kg/m.    ECOG FS:0 - Asymptomatic  General: Well-developed, well-nourished, no acute distress. Eyes: Pink conjunctiva, anicteric sclera. HEENT: Normocephalic, moist mucous membranes. Lungs: Clear to auscultation bilaterally. Heart: Regular rate and rhythm. No rubs, murmurs, or gallops. Abdomen: Soft, nontender, nondistended. No organomegaly noted, normoactive bowel sounds. Musculoskeletal: No edema, cyanosis, or clubbing. Neuro: Alert, answering all questions appropriately. Cranial nerves grossly intact. Skin: No rashes or petechiae noted. Psych: Normal affect.  LAB RESULTS:  Lab Results  Component Value Date   NA 138 08/17/2016   K 3.6 08/17/2016   CL 101 08/17/2016   CO2 25 08/17/2016   GLUCOSE 81 08/17/2016   BUN 11 08/17/2016   CREATININE 0.88 08/17/2016   CALCIUM 9.4 08/17/2016   PROT 7.3 08/17/2016   ALBUMIN 4.0 08/17/2016   AST 25 08/17/2016   ALT 30 (H) 08/17/2016   ALKPHOS 85 08/17/2016   BILITOT 0.3 08/17/2016   GFRNONAA 64 08/04/2015   GFRAA 74 08/04/2015    Lab Results  Component Value Date   WBC 6.3 12/20/2017   NEUTROABS 4.0 12/20/2017   HGB 13.4 12/20/2017   HCT 38.7 12/20/2017   MCV 85.1 12/20/2017   PLT 151 12/20/2017     STUDIES: No results found.  ASSESSMENT: Bone marrow biopsy proven ITP.  PLAN:    1. ITP: Bone marrow biopsy performed on July 28, 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 Patient responded to steroids, but the results were not durable. She completed her first round of weekly Rituxan on Aug 30, 2015. This was durable for approximately one year and she then completed 4 weekly cycles of Rituxan on November 23, 2016. Of note, she will require additional premedications in the future since she had a mild reaction with cycle 1.  Patient's platelet  count is now within normal limits at 151 today.  Proceed with cycle 4 of 4 of Rituxan today.  Return to clinic in 3 months with repeat laboratory can further evaluation.   2.  Fatigue: Possibly related to Rituxan, monitor. 3.  Back pain: Continue evaluation and treatment per primary care.  I spent a total of 30 minutes face-to-face with the patient of which greater than 50% of the visit was spent in counseling and coordination of care as detailed above.   Patient expressed understanding and was in agreement with this plan. She also understands that She can call clinic at any time with any questions, concerns, or complaints.   Lloyd Huger, MD   12/23/2017 11:18 AM

## 2017-12-20 ENCOUNTER — Inpatient Hospital Stay: Payer: Medicare HMO

## 2017-12-20 ENCOUNTER — Other Ambulatory Visit: Payer: Self-pay

## 2017-12-20 ENCOUNTER — Encounter: Payer: Self-pay | Admitting: Oncology

## 2017-12-20 ENCOUNTER — Inpatient Hospital Stay (HOSPITAL_BASED_OUTPATIENT_CLINIC_OR_DEPARTMENT_OTHER): Payer: Medicare HMO | Admitting: Oncology

## 2017-12-20 ENCOUNTER — Encounter: Payer: Self-pay | Admitting: *Deleted

## 2017-12-20 VITALS — BP 121/85 | HR 64 | Temp 97.9°F | Resp 20 | Ht 63.0 in | Wt 172.0 lb

## 2017-12-20 DIAGNOSIS — D693 Immune thrombocytopenic purpura: Secondary | ICD-10-CM

## 2017-12-20 DIAGNOSIS — G8929 Other chronic pain: Secondary | ICD-10-CM | POA: Diagnosis not present

## 2017-12-20 DIAGNOSIS — F17210 Nicotine dependence, cigarettes, uncomplicated: Secondary | ICD-10-CM

## 2017-12-20 DIAGNOSIS — Z79899 Other long term (current) drug therapy: Secondary | ICD-10-CM | POA: Diagnosis not present

## 2017-12-20 DIAGNOSIS — M549 Dorsalgia, unspecified: Secondary | ICD-10-CM | POA: Diagnosis not present

## 2017-12-20 DIAGNOSIS — R69 Illness, unspecified: Secondary | ICD-10-CM | POA: Diagnosis not present

## 2017-12-20 LAB — CBC WITH DIFFERENTIAL/PLATELET
BASOS ABS: 0.1 10*3/uL (ref 0–0.1)
BASOS PCT: 1 %
Eosinophils Absolute: 0.4 10*3/uL (ref 0–0.7)
Eosinophils Relative: 6 %
HCT: 38.7 % (ref 35.0–47.0)
HEMOGLOBIN: 13.4 g/dL (ref 12.0–16.0)
Lymphocytes Relative: 22 %
Lymphs Abs: 1.4 10*3/uL (ref 1.0–3.6)
MCH: 29.5 pg (ref 26.0–34.0)
MCHC: 34.6 g/dL (ref 32.0–36.0)
MCV: 85.1 fL (ref 80.0–100.0)
Monocytes Absolute: 0.4 10*3/uL (ref 0.2–0.9)
Monocytes Relative: 7 %
NEUTROS PCT: 64 %
Neutro Abs: 4 10*3/uL (ref 1.4–6.5)
Platelets: 151 10*3/uL (ref 150–440)
RBC: 4.55 MIL/uL (ref 3.80–5.20)
RDW: 14.8 % — ABNORMAL HIGH (ref 11.5–14.5)
WBC: 6.3 10*3/uL (ref 3.6–11.0)

## 2017-12-20 MED ORDER — SODIUM CHLORIDE 0.9 % IV SOLN
375.0000 mg/m2 | Freq: Once | INTRAVENOUS | Status: AC
  Administered 2017-12-20: 700 mg via INTRAVENOUS
  Filled 2017-12-20: qty 50

## 2017-12-20 MED ORDER — FAMOTIDINE IN NACL 20-0.9 MG/50ML-% IV SOLN
20.0000 mg | Freq: Two times a day (BID) | INTRAVENOUS | Status: DC
  Filled 2017-12-20: qty 50

## 2017-12-20 MED ORDER — ACETAMINOPHEN 325 MG PO TABS
650.0000 mg | ORAL_TABLET | Freq: Once | ORAL | Status: AC
  Administered 2017-12-20: 650 mg via ORAL
  Filled 2017-12-20: qty 2

## 2017-12-20 MED ORDER — DIPHENHYDRAMINE HCL 50 MG/ML IJ SOLN
25.0000 mg | Freq: Once | INTRAMUSCULAR | Status: AC
  Administered 2017-12-20: 25 mg via INTRAVENOUS
  Filled 2017-12-20: qty 1

## 2017-12-20 MED ORDER — SODIUM CHLORIDE 0.9 % IV SOLN
Freq: Once | INTRAVENOUS | Status: AC
  Administered 2017-12-20: 09:00:00 via INTRAVENOUS
  Filled 2017-12-20: qty 250

## 2017-12-20 MED ORDER — DEXAMETHASONE SODIUM PHOSPHATE 10 MG/ML IJ SOLN
10.0000 mg | Freq: Once | INTRAMUSCULAR | Status: AC
  Administered 2017-12-20: 10 mg via INTRAVENOUS
  Filled 2017-12-20: qty 1

## 2017-12-20 MED ORDER — FAMOTIDINE IN NACL 20-0.9 MG/50ML-% IV SOLN
20.0000 mg | Freq: Once | INTRAVENOUS | Status: AC
  Administered 2017-12-20: 20 mg via INTRAVENOUS

## 2017-12-20 MED ORDER — SODIUM CHLORIDE 0.9 % IV SOLN: 375.0000 mg/m2 | Freq: Once | INTRAVENOUS | Status: DC

## 2017-12-20 MED ORDER — FAMOTIDINE IN NACL 20-0.9 MG/50ML-% IV SOLN: 20.0000 mg | Freq: Two times a day (BID) | INTRAVENOUS | Status: DC

## 2017-12-20 NOTE — Progress Notes (Signed)
Patient here for f/u and treatment for ITP. Patient c/o generalized fatigue and intermittent nausea. She has zofran available for intermittent nausea.

## 2018-01-18 ENCOUNTER — Encounter: Payer: Self-pay | Admitting: Nurse Practitioner

## 2018-01-18 ENCOUNTER — Other Ambulatory Visit: Payer: Self-pay

## 2018-01-18 ENCOUNTER — Ambulatory Visit (INDEPENDENT_AMBULATORY_CARE_PROVIDER_SITE_OTHER): Payer: Medicare HMO | Admitting: Nurse Practitioner

## 2018-01-18 VITALS — BP 131/77 | HR 79 | Temp 98.6°F | Ht 63.0 in | Wt 173.6 lb

## 2018-01-18 DIAGNOSIS — H0014 Chalazion left upper eyelid: Secondary | ICD-10-CM

## 2018-01-18 DIAGNOSIS — J0140 Acute pansinusitis, unspecified: Secondary | ICD-10-CM

## 2018-01-18 MED ORDER — ERYTHROMYCIN 5 MG/GM OP OINT: 1.0000 "application " | TOPICAL_OINTMENT | Freq: Four times a day (QID) | OPHTHALMIC | 0 refills | Status: AC

## 2018-01-18 MED ORDER — AMOXICILLIN-POT CLAVULANATE 875-125 MG PO TABS: 1.0000 | ORAL_TABLET | Freq: Two times a day (BID) | ORAL | 0 refills | Status: AC

## 2018-01-18 NOTE — Progress Notes (Signed)
Subjective:    Patient ID: Erica Bartlett, female    DOB: December 01, 1955, 62 y.o.   MRN: [id number]  Erica Bartlett is a 62 y.o. female presenting on 01/18/2018 for Stye (on the eyelid of the left eye, sorness to the touch x 1 week) and Sinusitis (sinus pressure in the head x  1 week )   HPI Stye/Hordeolum/Chalazion X 1 week patient has had bumps on upper left eyelid slightly sore to the touch and is accompanied with slightly blurry vision.  Patient is using warm compresses without relief.  She has not had this before.  She wears contacts and is currently wearing.  Sinus pressure Patient presents today with sinus pressure increased over the last week accompanied by chills and sweats.  Patient does have seasonal allergic rhinitis usually most bothersome in the spring.  She has not been taking her Zyrtec that she normally takes through spring months.  Notes similar symptoms with sinus pressure associated with allergies.  Patient has had no cough, sore throat, ear pressure, ear pain, ear fullness, tooth or jaw pain.  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  Social History   Tobacco Use  . Smoking status: Current Some Day Smoker    Packs/day: 0.15    Types: Cigarettes  . Smokeless tobacco: Former Systems developer  . Tobacco comment: 1 pack takes 3 weeks to finish   Substance Use Topics  . Alcohol use: No  . Drug use: No    Review of Systems Per HPI unless specifically indicated above     Objective:    BP 131/77 (BP Location: Right Arm, Patient Position: Sitting, Cuff Size: Normal)   Pulse 79   Temp 98.6 F (37 C) (Oral)   Ht 5\' 3"  (1.6 m)   Wt 173 lb 9.6 oz (78.7 kg)   BMI 30.75 kg/m   Wt Readings from Last 3 Encounters:  01/18/18 173 lb 9.6 oz (78.7 kg)  12/20/17 172 lb (78 kg)  12/13/17 170 lb 11.2 oz (77.4 kg)    Physical Exam  Constitutional: She appears well-developed and well-nourished.  HENT:  Head: Normocephalic and atraumatic.  Right  Ear: Hearing, tympanic membrane, external ear and ear canal normal.  Left Ear: Hearing, tympanic membrane, external ear and ear canal normal.  Nose: Mucosal edema and rhinorrhea present. Right sinus exhibits maxillary sinus tenderness and frontal sinus tenderness. Left sinus exhibits maxillary sinus tenderness and frontal sinus tenderness.  Mouth/Throat: Uvula is midline and mucous membranes are normal. Oropharyngeal exudate (clear secretions) and posterior oropharyngeal edema (cobblestoning) present. Tonsils are 0 on the right. Tonsils are 0 on the left.  Eyes: Pupils are equal, round, and reactive to light. Conjunctivae and EOM are normal. Right eye exhibits hordeolum. Right eye exhibits no discharge. Left eye exhibits no discharge.  Neck: Normal range of motion. Neck supple.  Cardiovascular: Normal rate, regular rhythm, S1 normal, S2 normal, normal heart sounds and intact distal pulses.  Pulmonary/Chest: Effort normal and breath sounds normal. No respiratory distress.  Lymphadenopathy:    She has cervical adenopathy.  Neurological: She is alert.  Skin: Skin is warm and dry. Capillary refill takes less than 2 seconds.  Psychiatric: She has a normal mood and affect. Her behavior is normal.  Vitals reviewed.    Results for orders placed or performed in visit on 12/20/17  CBC with Differential/Platelet  Result Value Ref Range   WBC 6.3 3.6 - 11.0 K/uL   RBC 4.55 3.80 - 5.20 MIL/uL  Hemoglobin 13.4 12.0 - 16.0 g/dL   HCT 38.7 35.0 - 47.0 %   MCV 85.1 80.0 - 100.0 fL   MCH 29.5 26.0 - 34.0 pg   MCHC 34.6 32.0 - 36.0 g/dL   RDW 14.8 (H) 11.5 - 14.5 %   Platelets 151 150 - 440 K/uL   Neutrophils Relative % 64 %   Neutro Abs 4.0 1.4 - 6.5 K/uL   Lymphocytes Relative 22 %   Lymphs Abs 1.4 1.0 - 3.6 K/uL   Monocytes Relative 7 %   Monocytes Absolute 0.4 0.2 - 0.9 K/uL   Eosinophils Relative 6 %   Eosinophils Absolute 0.4 0 - 0.7 K/uL   Basophils Relative 1 %   Basophils Absolute 0.1 0 -  0.1 K/uL      Assessment & Plan:   Problem List Items Addressed This Visit    None    Visit Diagnoses    Chalazion left upper eyelid    -  Primary Patient with chalazion upper left eyelid not resolving with adequate conservative therapy. - Start erythromycin ointment qid x 7 days. - Continue conservative therapy - Avoid using contacts until infection gone.  Replace disposable contacts after infection. - Follow-up prn.    Acute non-recurrent pansinusitis     Consistent with URI and secondary sinusitis with symptoms worsening over the past 7 days and initial symptoms of nasal congestion and sinus pressure over 2 weeks ago.   Plan: 1.START taking Augmentin 875-125 mg tablets every 12 hours for 10 days.  Discussed completing antibiotic. - While on antibiotic, take a probiotic OTC or from food. - Continue anti-histamine loratadine 10mg  daily. - Can use Flonase 2 sprays each nostril daily for up to 4-6 weeks if no epistaxis. - Start Mucinex-DM OTC for  7-10 days prn congestion 2. Supportive care with nasal saline, warm herbal tea with honey, 3. Improve hydration 4. Tylenol / Motrin PRN fevers  5. Return criteria given    Relevant Medications   diphenhydrAMINE (BENADRYL) 25 mg capsule      Meds ordered this encounter  Medications  . erythromycin ophthalmic ointment    Sig: Place 1 application into the left eye 4 (four) times daily for 7 days.    Dispense:  28 g    Refill:  0    Order Specific Question:   Supervising Provider    Answer:   Olin Hauser [[id number]]  . amoxicillin-clavulanate (AUGMENTIN) 875-125 MG tablet    Sig: Take 1 tablet by mouth 2 (two) times daily for 10 days.    Dispense:  20 tablet    Refill:  0    Order Specific Question:   Supervising Provider    Answer:   Olin Hauser [[id number]]    Follow up plan: Return 5-7 days if symptoms worsen or fail to improve.  Cassell Smiles, DNP, AGPCNP-BC Adult Gerontology Primary Care Nurse  Practitioner [facility] 01/18/2018, 1:35 PM

## 2018-01-18 NOTE — Patient Instructions (Addendum)
Erica Bartlett,   Thank you for coming in to clinic today.  1. Avoid contact use for 2 weeks. Wear glasses.  Then, use a new set of contact if your chalazion has gotten better.  2. Use erythromycin ointment four times daily for 7 days.  3. It sounds like you have an Upper Respiratory Bacterial infection.  Recommend good hand washing. - START taking Augmentin 875-125mg  tablet. Take 1 tab twice daily for 10 days.  Make sure to take all doses of your antibiotic. - While you are on an antibiotic, take a probiotic.  Antibiotics kill good and bad bacteria.  A probiotic helps to replace your good bacteria. Probiotic pills can be found over the counter.  One brand is Florastor, but you can use any brand you prefer.  You can also get good bacteria from foods.  These foods are yogurt, kefir, kombucha, and fresh, refrigerated and uncooked sauerkraut. - Drink plenty of fluids.  - Start anti-histamine cetirizine 10mg  daily. - You may also use Flonase 2 sprays each nostril daily for up to 4-6 weeks  Other over the counter medications you may try, if needed for symptoms are: - If congestion is worse, start OTC Mucinex (or may try Mucinex-DM for cough) up to 7-10 days then stop - You may try over the counter Nasal Saline spray (Simply Saline, Ocean Spray) as needed to reduce congestion. - Start taking Tylenol extra strength 1 to 2 tablets every 6-8 hours for aches or fever/chills for next few days as needed.  Do not take more than 3,000 mg in 24 hours from all medicines.  You may also take ibuprofen 200-400mg  every 8 hours as needed.   - Drink warm herbal tea with honey for sore throat.   If symptoms are significantly worse with persistent fevers/chills despite tylenol/ibpurofen, nausea, vomiting unable to tolerate food/fluids or medicine, body aches, or shortness of breath, sinus pain pressure or worsening productive cough, then follow-up for re-evaluation, may seek more immediate care at Urgent Care or  the ED if you are more concerned that it is an emergency.  Please schedule a follow-up appointment with Cassell Smiles, AGNP. Return 5-7 days if symptoms worsen or fail to improve.  If you have any other questions or concerns, please feel free to call the clinic or send a message through [place]. You may also schedule an earlier appointment if necessary.  You will receive a survey after today's visit either digitally by e-mail or paper by [employer]. Your experiences and feedback matter to [place].  Please respond so we know how we are doing as we provide care for you.   Cassell Smiles, DNP, AGNP-BC Adult Gerontology Nurse Practitioner [facility], [facility]  Chalazion A chalazion is a swelling or lump on the eyelid. It can affect the upper or lower eyelid. What are the causes? This condition may be caused by:  Long-lasting (chronic) inflammation of the eyelid glands.  A blocked oil gland in the eyelid.  What are the signs or symptoms? Symptoms of this condition include:  A swelling on the eyelid. The swelling may spread to areas around the eye.  A hard lump on the eyelid. This lump may make it hard to see out of the eye.  How is this diagnosed? This condition is diagnosed with an examination of the eye. How is this treated? This condition is treated by applying a warm compress to the eyelid. If the condition does not improve after two days, it may  be treated with:  Surgery.  Medicine that is injected into the chalazion by a health care provider.  Medicine that is applied to the eye.  Follow these instructions at home:  Do not touch the chalazion.  Do not try to remove the pus, such as by squeezing the chalazion or sticking it with a pin or needle.  Do not rub your eyes.  Wash your hands often. Dry your hands with a clean towel.  Keep your face, scalp, and eyebrows clean.  Avoid wearing eye makeup.  Apply a warm, moist compress to the eyelid 4-6 times a day  for 10-15 minutes at a time. This will help to open any blocked glands and help to reduce redness and swelling.  Apply over-the-counter and prescription medicines only as told by your health care provider.  If the chalazion does not break open (rupture) on its own in a month, return to your health care provider.  Keep all follow-up appointments as told by your health care provider. This is important. Contact a health care provider if:  Your eyelid has not improved in 4 weeks.  Your eyelid is getting worse.  You have a fever.  The chalazion does not rupture on its own with home treatment in a month. Get help right away if:  You have pain in your eye.  Your vision changes.  The chalazion becomes painful or red  The chalazion gets bigger. This information is not intended to replace advice given to you by your health care provider. Make sure you discuss any questions you have with your health care provider. Document Released: 04/07/2000 Document Revised: 09/16/2015 Document Reviewed: 08/03/2014 Elsevier Interactive Patient Education  [employer].

## 2018-01-23 DIAGNOSIS — H0014 Chalazion left upper eyelid: Secondary | ICD-10-CM | POA: Diagnosis not present

## 2018-03-15 ENCOUNTER — Encounter: Payer: Self-pay | Admitting: Nurse Practitioner

## 2018-03-17 NOTE — Progress Notes (Signed)
[name]  [phone] [phone]  ID: Erica Bartlett OB: [birth year]  MR#: [id number]  YOK#:[id number]  Patient Care Team: Mikey College, NP as PCP - General (Nurse Practitioner) Chucky May, MD as Consulting Physician (Psychiatry) Lloyd Huger, MD as Consulting Physician (Oncology)  CHIEF COMPLAINT: ITP  INTERVAL HISTORY: Patient returns to clinic today for repeat laboratory work and further evaluation.  Currently feels well and is asymptomatic.  She does not complain of any fatigue today.  She denies any easy bleeding or bruising. She continues to have chronic back pain and difficulty sleeping. She denies any recent fevers or illnesses. She has no neurologic complaints. She denies any chest pain or shortness of breath. She has no nausea, vomiting, constipation, or diarrhea. She has no urinary complaints.  Patient feels at her baseline offers no further specific complaints today.  REVIEW OF SYSTEMS:    Review of Systems  Constitutional: Negative.  Negative for fever, malaise/fatigue and weight loss.  Respiratory: Negative.  Negative for cough and shortness of breath.   Cardiovascular: Negative.  Negative for chest pain and leg swelling.  Gastrointestinal: Negative.  Negative for blood in stool, melena and nausea.  Genitourinary: Negative.  Negative for hematuria.  Musculoskeletal: Positive for back pain. Negative for joint pain.  Skin: Negative.  Negative for rash.  Neurological: Negative.  Negative for sensory change, focal weakness, weakness and headaches.  Endo/Heme/Allergies: Does not bruise/bleed easily.  Psychiatric/Behavioral: Negative.  The patient is not nervous/anxious and does not have insomnia.     As per HPI. Otherwise, a complete review of systems is negative.  PAST MEDICAL HISTORY: Past Medical History:  Diagnosis Date  . Hypertension     PAST SURGICAL HISTORY: Past Surgical History:  Procedure  Laterality Date  . ABDOMINAL HYSTERECTOMY    . BREAST BIOPSY Left    age 62 surgical bx neg  . CESAREAN SECTION    . cyst removed     breast, benign    FAMILY HISTORY: Reviewed and unchanged. No reported history of malignancy or chronic disease.     ADVANCED DIRECTIVES:    HEALTH MAINTENANCE: Social History   Tobacco Use  . Smoking status: Current Some Day Smoker    Packs/day: 0.15    Types: Cigarettes  . Smokeless tobacco: Former Systems developer  . Tobacco comment: 1 pack takes 3 weeks to finish   Substance Use Topics  . Alcohol use: No  . Drug use: No    Allergies  Allergen Reactions  . Doxycycline Nausea And Vomiting  . Penicillins Hives    Current Outpatient Medications  Medication Sig Dispense Refill  . acetaminophen (TYLENOL) 500 MG tablet Take 500 mg by mouth every 6 (six) hours as needed.    Marland Kitchen albuterol (PROVENTIL HFA;VENTOLIN HFA) 108 (90 Base) MCG/ACT inhaler Inhale 1-2 puffs into the lungs every 6 (six) hours as needed for wheezing or shortness of breath. 1 Inhaler 11  . amLODipine (NORVASC) 10 MG tablet TAKE 1 TABLET (10 MG TOTAL) BY MOUTH DAILY. 30 tablet 9  . cetirizine (ZYRTEC) 10 MG tablet Take 1 tablet (10 mg total) by mouth daily. 30 tablet 11  . clonazePAM (KLONOPIN) 1 MG tablet Take 1-2 mg by mouth 3 (three) times daily as needed. Reported on 10/11/2015    . diphenhydrAMINE (BENADRYL) 25 mg capsule Take 25 mg by mouth as needed.    . fluticasone (FLONASE) 50 MCG/ACT nasal spray Place 2 sprays into both nostrils daily. 16 g 11  .  hydrochlorothiazide (HYDRODIURIL) 25 MG tablet Take 1 tablet (25 mg total) by mouth daily. 90 tablet 2  . ipratropium (ATROVENT) 0.06 % nasal spray Place 2 sprays into both nostrils 4 (four) times daily. For up to 5-7 days then stop. 15 mL 0  . ondansetron (ZOFRAN) 4 MG tablet Take 1 tablet (4 mg total) by mouth every 8 (eight) hours as needed for nausea or vomiting. 20 tablet 0   No current facility-administered medications for this  visit.     OBJECTIVE: Vitals:   03/18/18 1131  BP: 138/82  Pulse: 64  Resp: 18  Temp: (!) 97.4 F (36.3 C)     Body mass index is 30.65 kg/m.    ECOG FS:0 - Asymptomatic  General: Well-developed, well-nourished, no acute distress. Eyes: Pink conjunctiva, anicteric sclera. HEENT: Normocephalic, moist mucous membranes. Lungs: Clear to auscultation bilaterally. Heart: Regular rate and rhythm. No rubs, murmurs, or gallops. Abdomen: Soft, nontender, nondistended. No organomegaly noted, normoactive bowel sounds. Musculoskeletal: No edema, cyanosis, or clubbing. Neuro: Alert, answering all questions appropriately. Cranial nerves grossly intact. Skin: No rashes or petechiae noted. Psych: Normal affect.  LAB RESULTS:  Lab Results  Component Value Date   NA 138 08/17/2016   K 3.6 08/17/2016   CL 101 08/17/2016   CO2 25 08/17/2016   GLUCOSE 81 08/17/2016   BUN 11 08/17/2016   CREATININE 0.88 08/17/2016   CALCIUM 9.4 08/17/2016   PROT 7.3 08/17/2016   ALBUMIN 4.0 08/17/2016   AST 25 08/17/2016   ALT 30 (H) 08/17/2016   ALKPHOS 85 08/17/2016   BILITOT 0.3 08/17/2016   GFRNONAA 64 08/04/2015   GFRAA 74 08/04/2015    Lab Results  Component Value Date   WBC 4.2 03/18/2018   NEUTROABS 2.5 03/18/2018   HGB 13.5 03/18/2018   HCT 41.0 03/18/2018   MCV 86.3 03/18/2018   PLT 176 03/18/2018     STUDIES: No results found.  ASSESSMENT: Bone marrow biopsy proven ITP.  PLAN:    1. ITP: Bone marrow biopsy performed on July 28, 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 Patient responded to steroids, but the results were not durable. She completed her first round of weekly Rituxan on Aug 30, 2015, her second treatment completed on November 23, 2016, and now her third round of weekly Rituxan x4 completed on December 20, 2017.  If patient requires additional treatment in the future, she will need increased premedications since she had a mild reaction with cycle  1 during her most recent treatment cycle.  Patient's platelet count continues to improve and is now 176.  Return to clinic in 3 months with repeat laboratory work and further evaluation. 2.  Fatigue: Resolved. 3.  Back pain: Chronic and unchanged. Continue evaluation and treatment per primary care.   Patient expressed understanding and was in agreement with this plan. She also understands that She can call clinic at any time with any questions, concerns, or complaints.   Lloyd Huger, MD   03/24/2018 8:36 AM

## 2018-03-18 ENCOUNTER — Ambulatory Visit: Payer: Medicare HMO | Admitting: Oncology

## 2018-03-18 ENCOUNTER — Inpatient Hospital Stay (HOSPITAL_BASED_OUTPATIENT_CLINIC_OR_DEPARTMENT_OTHER): Payer: Medicare HMO | Admitting: Oncology

## 2018-03-18 ENCOUNTER — Encounter: Payer: Self-pay | Admitting: Oncology

## 2018-03-18 ENCOUNTER — Other Ambulatory Visit: Payer: Medicare HMO

## 2018-03-18 ENCOUNTER — Inpatient Hospital Stay: Payer: Medicare HMO | Attending: Oncology

## 2018-03-18 VITALS — BP 138/82 | HR 64 | Temp 97.4°F | Resp 18 | Wt 173.0 lb

## 2018-03-18 DIAGNOSIS — D693 Immune thrombocytopenic purpura: Secondary | ICD-10-CM

## 2018-03-18 DIAGNOSIS — I10 Essential (primary) hypertension: Secondary | ICD-10-CM

## 2018-03-18 DIAGNOSIS — F17210 Nicotine dependence, cigarettes, uncomplicated: Secondary | ICD-10-CM | POA: Insufficient documentation

## 2018-03-18 DIAGNOSIS — Z79899 Other long term (current) drug therapy: Secondary | ICD-10-CM | POA: Diagnosis not present

## 2018-03-18 DIAGNOSIS — R69 Illness, unspecified: Secondary | ICD-10-CM | POA: Diagnosis not present

## 2018-03-18 LAB — CBC WITH DIFFERENTIAL/PLATELET
ABS IMMATURE GRANULOCYTES: 0.01 10*3/uL (ref 0.00–0.07)
Basophils Absolute: 0 10*3/uL (ref 0.0–0.1)
Basophils Relative: 1 %
EOS PCT: 4 %
Eosinophils Absolute: 0.2 10*3/uL (ref 0.0–0.5)
HEMATOCRIT: 41 % (ref 36.0–46.0)
HEMOGLOBIN: 13.5 g/dL (ref 12.0–15.0)
Immature Granulocytes: 0 %
LYMPHS ABS: 1.4 10*3/uL (ref 0.7–4.0)
LYMPHS PCT: 32 %
MCH: 28.4 pg (ref 26.0–34.0)
MCHC: 32.9 g/dL (ref 30.0–36.0)
MCV: 86.3 fL (ref 80.0–100.0)
MONO ABS: 0.2 10*3/uL (ref 0.1–1.0)
Monocytes Relative: 5 %
NEUTROS ABS: 2.5 10*3/uL (ref 1.7–7.7)
Neutrophils Relative %: 58 %
Platelets: 176 10*3/uL (ref 150–400)
RBC: 4.75 MIL/uL (ref 3.87–5.11)
RDW: 14.1 % (ref 11.5–15.5)
WBC: 4.2 10*3/uL (ref 4.0–10.5)
nRBC: 0 % (ref 0.0–0.2)

## 2018-03-18 NOTE — Progress Notes (Signed)
Patient here today for follow up regarding thrombocytopenia, reports fatigue.

## 2018-03-25 ENCOUNTER — Ambulatory Visit (INDEPENDENT_AMBULATORY_CARE_PROVIDER_SITE_OTHER): Payer: Medicare HMO | Admitting: Nurse Practitioner

## 2018-03-25 ENCOUNTER — Encounter: Payer: Self-pay | Admitting: Nurse Practitioner

## 2018-03-25 ENCOUNTER — Other Ambulatory Visit: Payer: Self-pay

## 2018-03-25 VITALS — BP 119/69 | HR 63 | Temp 98.3°F | Resp 17 | Ht 63.0 in | Wt 175.2 lb

## 2018-03-25 DIAGNOSIS — B9789 Other viral agents as the cause of diseases classified elsewhere: Secondary | ICD-10-CM | POA: Diagnosis not present

## 2018-03-25 DIAGNOSIS — J069 Acute upper respiratory infection, unspecified: Secondary | ICD-10-CM | POA: Diagnosis not present

## 2018-03-25 DIAGNOSIS — R6883 Chills (without fever): Secondary | ICD-10-CM

## 2018-03-25 LAB — POCT INFLUENZA A/B
Influenza A, POC: NEGATIVE
Influenza B, POC: NEGATIVE

## 2018-03-25 NOTE — Progress Notes (Signed)
Subjective:    Patient ID: Erica Bartlett, female    DOB: [birth year], 62 y.o.   MRN: [id number]  Erica Bartlett is a 62 y.o. female presenting on 03/25/2018 for Influenza (coughing, congestion w/ yellowish mucus, aching,and headache x 3 days )   HPI Flu-like symptoms Rhinorrhea Thursday.  Friday no impropvement and felt worst Saturday with continuing worsening through Sunday.  Not feeling any better today.  Patient presents with flu-like symptoms x 3 days.  Symptoms include cough, congestion with productive cough, post nasal drainage, thick mucous secretions,  body aches, R ear fullness, sinus pressure, and headache.  She has not had any nausea, vomiting, or diarrhea, fever.  Has had chills/sweats/mild nausea/lightheaded on Saturday.   - Is keeping fluids and gatorade down.  Is able to eat bland food/soup. 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 Smoking status: Current Some Day Smoker    Packs/day: 0.15    Types: Cigarettes  . Smokeless tobacco: Former Systems developer  . Tobacco comment: 1 pack takes 3 weeks to finish   Substance Use Topics  . Alcohol use: No  . Drug use: No    Review of Systems Per HPI unless specifically indicated above     Objective:    BP 119/69 (BP Location: Right Arm, Patient Position: Sitting, Cuff Size: Normal)   Pulse 63   Temp 98.3 F (36.8 C) (Oral)   Resp 17   Ht 5\' 3"  (1.6 m)   Wt 175 lb 3.5 oz (79.5 kg)   SpO2 99%   BMI 31.04 kg/m   Wt Readings from Last 3 Encounters:  03/25/18 175 lb 3.5 oz (79.5 kg)  03/18/18 173 lb (78.5 kg)  01/18/18 173 lb 9.6 oz (78.7 kg)    Physical Exam  Constitutional: She appears well-developed and well-nourished. She appears distressed (mildly).  HENT:  Head: Normocephalic and atraumatic.  Right Ear: Hearing, tympanic  membrane, external ear and ear canal normal.  Left Ear: Hearing, tympanic membrane, external ear and ear canal normal.  Nose: Mucosal edema and rhinorrhea present. Right sinus exhibits maxillary sinus tenderness and frontal sinus tenderness. Left sinus exhibits maxillary sinus tenderness and frontal sinus tenderness.  Mouth/Throat: Uvula is midline and mucous membranes are normal. Posterior oropharyngeal edema (cobblestoning) and posterior oropharyngeal erythema (mildly injected) present. No oropharyngeal exudate (clear secretions).  Eyes: Pupils are equal, round, and reactive to light. Conjunctivae and EOM are normal. Right eye exhibits no discharge. Left eye exhibits no discharge.  Neck: Normal range of motion. Neck supple.  Cardiovascular: Normal rate, regular rhythm, S1 normal, S2 normal, normal heart sounds and intact distal pulses.  Pulmonary/Chest: Effort normal and breath sounds normal. No respiratory distress.  Lymphadenopathy:    She has cervical adenopathy.  Neurological: She is alert.  Skin: Skin is warm and dry. Capillary refill takes less than 2 seconds. She is not diaphoretic.  Psychiatric: She has a normal mood and affect. Her behavior is normal. Judgment and thought content normal.  Vitals reviewed.  Results for orders placed or performed in visit on 03/25/18  POCT Influenza A/B  Result Value Ref Range   Influenza A, POC Negative Negative   Influenza B, POC Negative Negative      Assessment & Plan:   Problem List Items Addressed This Visit  None    Visit Diagnoses    Viral URI with cough    -  Primary   Chill       Relevant Orders   POCT Influenza A/B      Acute illness. Fever responsive to NSAIDs and tylenol.  Symptoms not worsening. Consistent with viral illness x 3 days with known sick contacts and no identifiable focal infections of ears, nose, throat. - patient with last WBC in normal limits.  NO evidence of severe immunocompromised state, but had last dose  Chemo for ITP in August 2019.  Low threshold for treating any respiratory complications with prednisone and/or antibiotics. - Flu testing negative in clinic.  Patient in mild distress, but not fully consistent with flu symptoms and presentation.  Plan: 1. Reassurance, likely self-limited with cough lasting up to few weeks - Start Atrovent nasal spray decongestant 2 sprays each nostril up to 4 times daily for 5-7 days - Continue anti-histamine Loratadine or cetirizine 10mg  daily,  - also can use Flonase 2 sprays each nostril daily for up to 4-6 weeks - Start Mucinex-DM OTC up to 7-10 days then stop - Deferred on tessalon perles as they have not been effective for patient in past. 2. Supportive care with nasal saline, warm herbal tea with honey, 3. Improve hydration 4. Tylenol / Motrin PRN fevers 5. Return criteria given.  Patient instructed to call clinic for any worsening of symptoms and if not improved at intervals defined in AVS.  Follow up plan: Return 5-7 days if symptoms worsen or fail to improve.  Cassell Smiles, DNP, AGPCNP-BC Adult Gerontology Primary Care Nurse Practitioner [facility] 03/25/2018, 11:46 AM

## 2018-03-25 NOTE — Patient Instructions (Addendum)
Erica Bartlett,   Thank you for coming in to clinic today.  1. It sounds like you have a Upper Respiratory Virus - this will most likely run it's course in 7 to 10 days. Recommend good hand washing. - Start Atrovent nasal spray decongestant 2 sprays each nostril up to 4 times daily for 5-7 days - Continue anti-histamine loratadine or cetirizine 10mg  daily, also can use Flonase 2 sprays each nostril daily for up to 4-6 weeks - If congestion is worse, start OTC Mucinex (or may try Mucinex-DM for cough) up to 7-10 days then stop - Drink plenty of fluids to improve congestion - You may try over the counter Nasal Saline spray (Simply Saline, Ocean Spray) as needed to reduce congestion. - Drink warm herbal tea with honey for sore throat. - Start taking Tylenol extra strength 1 to 2 tablets every 6-8 hours for aches or fever/chills for next few days as needed.  Do not take more than 3,000 mg in 24 hours from all medicines.  May take Ibuprofen as well if tolerated 200-400mg  every 8 hours as needed.  If symptoms significantly worsening with persistent fevers/chills despite tylenol/ibpurofen, nausea, vomiting unable to tolerate food/fluids or medicine, body aches, or shortness of breath, sinus pain pressure or worsening productive cough, then follow-up for re-evaluation, may seek more immediate care at Urgent Care or ED if more concerned for emergency.   ** We will consider prednisone if no improvement by Wednesday.**   ** If fever, chills, sweats return call clinic for consideration of antibiotics in about 7 days. **  Please schedule a follow-up appointment with Cassell Smiles, AGNP. Return 5-7 days if symptoms worsen or fail to improve.  If you have any other questions or concerns, please feel free to call the clinic or send a message through [place]. You may also schedule an earlier appointment if necessary.  You will receive a survey after today's visit either digitally by e-mail or paper by  [employer]. Your experiences and feedback matter to [place].  Please respond so we know how we are doing as we provide care for you.   Cassell Smiles, DNP, AGNP-BC Adult Gerontology Nurse Practitioner [place]

## 2018-03-27 ENCOUNTER — Telehealth: Payer: Self-pay | Admitting: Nurse Practitioner

## 2018-03-27 DIAGNOSIS — J0140 Acute pansinusitis, unspecified: Secondary | ICD-10-CM

## 2018-03-27 MED ORDER — AMOXICILLIN-POT CLAVULANATE 875-125 MG PO TABS: 1.0000 | ORAL_TABLET | Freq: Two times a day (BID) | ORAL | 0 refills | Status: DC

## 2018-03-27 NOTE — Telephone Encounter (Signed)
We discussed this extension at her appointment. Letter sent to MyChart.

## 2018-03-27 NOTE — Telephone Encounter (Signed)
Pt called today said that she was not any better. Pt call back # is  [phone]. She said that her Drug store was [facility]

## 2018-03-27 NOTE — Telephone Encounter (Signed)
Please call patient.  I have sent Augmentin 1 tab bid x 10 days to pharmacy.

## 2018-03-27 NOTE — Telephone Encounter (Signed)
The pt was notified that her abx was sent to her pharmacy. She's requesting that you extend her work note to return to work on Monday. 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

## 2018-04-03 ENCOUNTER — Ambulatory Visit (INDEPENDENT_AMBULATORY_CARE_PROVIDER_SITE_OTHER): Payer: Medicare HMO | Admitting: Family Medicine

## 2018-04-03 ENCOUNTER — Encounter: Payer: Self-pay | Admitting: Family Medicine

## 2018-04-03 ENCOUNTER — Telehealth: Payer: Self-pay

## 2018-04-03 ENCOUNTER — Ambulatory Visit
Admission: RE | Admit: 2018-04-03 | Discharge: 2018-04-03 | Disposition: A | Discharge status: Home / Self Care | Payer: Medicare HMO | Source: Ambulatory Visit | Attending: Family Medicine | Admitting: Family Medicine

## 2018-04-03 VITALS — BP 113/61 | HR 65 | Temp 98.7°F | Resp 16 | Ht 63.0 in | Wt 176.0 lb

## 2018-04-03 DIAGNOSIS — R509 Fever, unspecified: Secondary | ICD-10-CM

## 2018-04-03 DIAGNOSIS — J209 Acute bronchitis, unspecified: Secondary | ICD-10-CM | POA: Insufficient documentation

## 2018-04-03 DIAGNOSIS — R05 Cough: Secondary | ICD-10-CM | POA: Diagnosis not present

## 2018-04-03 MED ORDER — LEVOFLOXACIN 750 MG PO TABS: 750.0000 mg | ORAL_TABLET | Freq: Every day | ORAL | 0 refills | Status: DC

## 2018-04-03 MED ORDER — PREDNISONE 50 MG PO TABS: 50.0000 mg | ORAL_TABLET | Freq: Every day | ORAL | 0 refills | Status: DC

## 2018-04-03 NOTE — Telephone Encounter (Signed)
Encouraged patient to return to office to be seen.  May need levaquin for persistent sinusitis.  Concerned for secondary infection like flu or pneumonia and will need additional workup/exam.  Recent chemo(1.5 months ago) and needs reassessment. Patient verbalizes understanding.

## 2018-04-03 NOTE — Patient Instructions (Addendum)
Thank you for coming to the office today.  1. It sounds like you had an Upper Respiratory Virus that has settled into a Bronchitis, lower respiratory tract infection. I don't have concerns for pneumonia today, and think that this should gradually improve. Once you are feeling better, the cough may take a few weeks to fully resolve. I do hear coarse breath sounds, this may be due to the virus  You are at risk for Pneumonia  Stay tuned for results on Chest X-ray either later today or tomorrow, call if not heard back.  Start Levaquin 750mg  daily for 7 days  - Start Prednisone 50mg  daily for next 5 days - this will open up lungs allow you to breath better and treat that wheezing or bronchospasm  Note for work through this week  Finish other medicines to help reduce symptoms  If your symptoms seem to worsen instead of improve over next several days, including significant fever / chills, worsening shortness of breath, worsening wheezing, or nausea / vomiting and can't take medicines - return sooner or go to hospital Emergency Department for more immediate treatment.   Please schedule a Follow-up Appointment to: Return in about 1 week (around 04/10/2018), or if symptoms worsen or fail to improve, for pneumonia/bronchitis.  If you have any other questions or concerns, please feel free to call the office or send a message through [place]. You may also schedule an earlier appointment if necessary.  Additionally, you may be receiving a survey about your experience at our office within a few days to 1 week by e-mail or mail. We value your feedback.  [name], DO [place]

## 2018-04-03 NOTE — Telephone Encounter (Signed)
Patient seen today, see office visit note. Treated for pneumonia empirically with levaquin, also prednisone for breathing and chest x-ray ordered. Return precautions given.  Erica Bartlett, [facility] 04/03/2018, 3:10 PM

## 2018-04-03 NOTE — Telephone Encounter (Signed)
Pt states she coughing up thick brownish sputum. She is still taking the antibiotics, but have not notice any improvement with her symptoms.  She complains of cough, congestion with productive cough, post nasal drainage, thick mucous secretions, and  body aches. Pleas advise

## 2018-04-03 NOTE — Progress Notes (Signed)
Subjective:    Patient ID: Erica Bartlett, female    DOB: Aug 27, 1955, 62 y.o.   MRN: [id number]  Erica Bartlett is a 62 y.o. female presenting on 04/03/2018 for URI (didn't improved from past visit)  Patient presents for a same day appointment. PCP is Cassell Smiles, AGPCNP-BC  HPI   FOLLOW-UP URI / BRONCHITIS - Last visit with PCP on 03/25/18 saw Lauren at that time, she was treated for Flu Like symptoms URI with supportive care after Flu Test was negative. Called 2 days later on 12/4 with worsening, was given Augmentin antibiotic without significant improvement. She called earlier today 04/03/18 to report still having persistent sick symptoms, productive cough now deeper with some dark brown sputum, fever chills. Also complicated by history of ITP and chemotherapy. See prior notes for background information.  - Today patient reports still having productive cough, as mentioned thicker brown sputum. Feels subjective fever and with assoc chills and sweat. She states only marginally improved s/p Augmentin. Possibly feels worse or not improved at all. - She admits sick contacts at home, may have shared same illness - Still taking supportive care meds, nose spray Atrovent, OTC cold medicine - Admits chest congestion, some dyspnea at times but not persistent - Denies any nausea vomiting, chest pain   Depression screen [facility] 2/9 09/18/2017 08/17/2016 05/26/2016  Decreased Interest 1 1 1   Down, Depressed, Hopeless 1 0 0  PHQ - 2 Score 2 1 1   Altered sleeping 3 3 -  Tired, decreased energy 2 3 -  Change in appetite 2 3 -  Feeling bad or failure about yourself  1 0 -  Trouble concentrating 1 1 -  Moving slowly or fidgety/restless 0 0 -  Suicidal thoughts 0 0 -  PHQ-9 Score 11 11 -  Difficult doing work/chores Somewhat difficult Not difficult at all -    Social History   Tobacco Use  . Smoking status: Current Some Day Smoker    Packs/day: 0.15    Types: Cigarettes  . Smokeless  tobacco: Former Systems developer  . Tobacco comment: 1 pack takes 3 weeks to finish   Substance Use Topics  . Alcohol use: No  . Drug use: No    Review of Systems Per HPI unless specifically indicated above     Objective:    BP 113/61   Pulse 65   Temp 98.7 F (37.1 C) (Oral)   Resp 16   Ht 5\' 3"  (1.6 m)   Wt 176 lb (79.8 kg)   SpO2 100%   BMI 31.18 kg/m   Wt Readings from Last 3 Encounters:  04/03/18 176 lb (79.8 kg)  03/25/18 175 lb 3.5 oz (79.5 kg)  03/18/18 173 lb (78.5 kg)    Physical Exam  Constitutional: She is oriented to person, place, and time. She appears well-developed and well-nourished. No distress.  Mildly sick appearing and tired, cooperative  HENT:  Head: Normocephalic and atraumatic.  Mouth/Throat: Oropharynx is clear and moist.  Sinus non tender, some nasal congestion and turbinate edema, oropharynx clear.  Eyes: Conjunctivae are normal. Right eye exhibits no discharge. Left eye exhibits no discharge.  Neck: Normal range of motion. Neck supple.  Cardiovascular: Normal rate, regular rhythm, normal heart sounds and intact distal pulses.  No murmur heard. Pulmonary/Chest: Effort normal. No respiratory distress. She has no wheezes. She has rales (Bilateral bases).  Reduced air movement. Occasional cough. Still able to speak full sentences.  Musculoskeletal: Normal range of motion. She exhibits no  edema.  Lymphadenopathy:    She has no cervical adenopathy.  Neurological: She is alert and oriented to person, place, and time.  Skin: Skin is warm and dry. No rash noted. She is not diaphoretic. No erythema.  Psychiatric: She has a normal mood and affect. Her behavior is normal.  Well groomed, good eye contact, normal speech and thoughts  Nursing note and vitals reviewed.    Results for orders placed or performed in visit on 03/25/18  POCT Influenza A/B  Result Value Ref Range   Influenza A, POC Negative Negative   Influenza B, POC Negative Negative        Assessment & Plan:   Problem List Items Addressed This Visit    None    Visit Diagnoses    Acute bronchitis, unspecified organism    -  Primary   Relevant Medications   levofloxacin (LEVAQUIN) 750 MG tablet   predniSONE (DELTASONE) 50 MG tablet   Other Relevant Orders   DG Chest 2 View   Fever and chills       Relevant Orders   DG Chest 2 View      Presumed CAP vs worsening bronchitis / lower respiratory tract infection in 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 No hospitalization recently. Last antibiotic Augmentin within 10 days ago failed. Afebrile, not tachycardic, no hypoxia (100% on RA)  Plan: STAT CXR today - follow-up result  Regardless, will start empiric antibiotics with Levaquin 750mg  daily x 7 days for high risk patient - considered Ceftriaxone IM but due to PCN allergy deferred this.  Add Prednisone 50mg  daily due to some difficulty with some air movement. Has helped before with cough.  Continue supportive meds as rx by PCP recently  Note for work from today through Friday, back Monday  Follow-up within 1 week if not improving, otherwise strict return criteria to go to ED    Meds ordered this encounter  Medications  . levofloxacin (LEVAQUIN) 750 MG tablet    Sig: Take 1 tablet (750 mg total) by mouth daily. For 7 days    Dispense:  7 tablet    Refill:  0  . predniSONE (DELTASONE) 50 MG tablet    Sig: Take 1 tablet (50 mg total) by mouth daily with breakfast.    Dispense:  5 tablet    Refill:  0    Follow up plan: Return in about 1 week (around 04/10/2018), or if symptoms worsen or fail to improve, for pneumonia/bronchitis.   [name], [facility] 04/03/2018, 2:51 PM

## 2018-04-18 ENCOUNTER — Ambulatory Visit (INDEPENDENT_AMBULATORY_CARE_PROVIDER_SITE_OTHER): Payer: Medicare HMO | Admitting: Nurse Practitioner

## 2018-04-18 ENCOUNTER — Encounter: Payer: Self-pay | Admitting: Nurse Practitioner

## 2018-04-18 VITALS — BP 121/70 | HR 68 | Temp 98.9°F | Resp 16 | Ht 63.0 in | Wt 178.0 lb

## 2018-04-18 DIAGNOSIS — J209 Acute bronchitis, unspecified: Secondary | ICD-10-CM | POA: Diagnosis not present

## 2018-04-18 DIAGNOSIS — J189 Pneumonia, unspecified organism: Secondary | ICD-10-CM

## 2018-04-18 DIAGNOSIS — J329 Chronic sinusitis, unspecified: Secondary | ICD-10-CM | POA: Diagnosis not present

## 2018-04-18 MED ORDER — LEVOFLOXACIN 750 MG PO TABS: 750.0000 mg | ORAL_TABLET | Freq: Every day | ORAL | 0 refills | Status: DC

## 2018-04-18 MED ORDER — PREDNISONE 50 MG PO TABS: 50.0000 mg | ORAL_TABLET | Freq: Every day | ORAL | 0 refills | Status: DC

## 2018-04-18 MED ORDER — CEFTRIAXONE SODIUM 1 G IJ SOLR
1.0000 g | Freq: Once | INTRAMUSCULAR | Status: AC
  Administered 2018-04-18: 1 g via INTRAMUSCULAR

## 2018-04-18 NOTE — Patient Instructions (Addendum)
Erica Bartlett,   Thank you for coming in to clinic today.  1. Collect your sputum culture in the next 24 hours before you start your levaquin.    2. Repeat levaquin 750 mg one tab daily for 7 days  3. Repeat prednisone 50 mg once daily for 5 days.  4. Wait to hear from [facility] ENT for your referral for sinus congestion evaluation.  Please schedule a follow-up appointment with Erica Bartlett, AGNP. Return 7-10 days if symptoms worsen or fail to improve.  If you have any other questions or concerns, please feel free to call the clinic or send a message through [place]. You may also schedule an earlier appointment if necessary.  You will receive a survey after today's visit either digitally by e-mail or paper by [employer]. Your experiences and feedback matter to [place].  Please respond so we know how we are doing as we provide care for you.   Erica Smiles, DNP, AGNP-BC Adult Gerontology Nurse Practitioner [place]
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
## 2018-04-23 ENCOUNTER — Encounter: Payer: Self-pay | Admitting: Nurse Practitioner

## 2018-04-25 ENCOUNTER — Other Ambulatory Visit: Payer: Self-pay | Admitting: *Deleted

## 2018-04-25 DIAGNOSIS — D696 Thrombocytopenia, unspecified: Secondary | ICD-10-CM

## 2018-04-26 NOTE — Progress Notes (Signed)
[facility]  [phone] [phone]  ID: Erica Bartlett OB: 08/27/1955  MR#: [id number]  QXI#:[id number]  Patient Care Team: Mikey College, NP as PCP - General (Nurse Practitioner) Chucky May, MD as Consulting Physician (Psychiatry) Lloyd Huger, MD as Consulting Physician (Oncology)  CHIEF COMPLAINT: ITP  INTERVAL HISTORY: Patient returns to clinic today as an add-on after having persistent infection over the past several weeks.  Patient reports increased sinus congestion, cough, but denies fever.  She has increased fatigue.  She denies any easy bleeding or bruising. She continues to have chronic back pain and difficulty sleeping. She has no neurologic complaints. She denies any chest pain or shortness of breath. She has no nausea, vomiting, constipation, or diarrhea. She has no urinary complaints.  Patient otherwise feels well and offers no further specific complaints.  REVIEW OF SYSTEMS:    Review of Systems  Constitutional: Positive for malaise/fatigue. Negative for fever and weight loss.  HENT: Positive for congestion.   Respiratory: Positive for cough. Negative for shortness of breath.   Cardiovascular: Negative.  Negative for chest pain and leg swelling.  Gastrointestinal: Negative.  Negative for blood in stool, melena and nausea.  Genitourinary: Negative.  Negative for hematuria.  Musculoskeletal: Positive for back pain. Negative for joint pain.  Skin: Negative.  Negative for rash.  Neurological: Negative.  Negative for sensory change, focal weakness, weakness and headaches.  Endo/Heme/Allergies: Does not bruise/bleed easily.  Psychiatric/Behavioral: Negative.  The patient is not nervous/anxious and does not have insomnia.     As per HPI. Otherwise, a complete review of systems is negative.  PAST MEDICAL HISTORY: Past Medical History:  Diagnosis Date  . Hypertension     PAST SURGICAL HISTORY: Past Surgical  History:  Procedure Laterality Date  . ABDOMINAL HYSTERECTOMY    . BREAST BIOPSY Left    age 29 surgical bx neg  . CESAREAN SECTION    . cyst removed     breast, benign    FAMILY HISTORY: Reviewed and unchanged. No reported history of malignancy or chronic disease.     ADVANCED DIRECTIVES:    HEALTH MAINTENANCE: Social History   Tobacco Use  . Smoking status: Current Some Day Smoker    Packs/day: 0.15    Types: Cigarettes  . Smokeless tobacco: Former Systems developer  . Tobacco comment: 1 pack takes 3 weeks to finish   Substance Use Topics  . Alcohol use: No  . Drug use: No    Allergies  Allergen Reactions  . Doxycycline Nausea And Vomiting  . Penicillins Hives    Current Outpatient Medications  Medication Sig Dispense Refill  . acetaminophen (TYLENOL) 500 MG tablet Take 500 mg by mouth every 6 (six) hours as needed.    Marland Kitchen albuterol (PROVENTIL HFA;VENTOLIN HFA) 108 (90 Base) MCG/ACT inhaler Inhale 1-2 puffs into the lungs every 6 (six) hours as needed for wheezing or shortness of breath. 1 Inhaler 11  . amLODipine (NORVASC) 10 MG tablet TAKE 1 TABLET (10 MG TOTAL) BY MOUTH DAILY. 30 tablet 9  . cetirizine (ZYRTEC) 10 MG tablet Take 1 tablet (10 mg total) by mouth daily. 30 tablet 11  . clonazePAM (KLONOPIN) 1 MG tablet Take 1-2 mg by mouth 3 (three) times daily as needed. Reported on 10/11/2015    . diphenhydrAMINE (BENADRYL) 25 mg capsule Take 25 mg by mouth as needed.    . fluticasone (FLONASE) 50 MCG/ACT nasal spray Place 2 sprays into both nostrils daily. 16 g 11  .  hydrochlorothiazide (HYDRODIURIL) 25 MG tablet Take 1 tablet (25 mg total) by mouth daily. 90 tablet 2  . ipratropium (ATROVENT) 0.06 % nasal spray Place 2 sprays into both nostrils 4 (four) times daily. For up to 5-7 days then stop. 15 mL 0  . ondansetron (ZOFRAN) 4 MG tablet Take 1 tablet (4 mg total) by mouth every 8 (eight) hours as needed for nausea or vomiting. 20 tablet 0   No current facility-administered  medications for this visit.     OBJECTIVE: Vitals:   04/30/18 0831  BP: 135/89  Pulse: 61  Temp: 97.8 F (36.6 C)     Body mass index is 30.96 kg/m.    ECOG FS:0 - Asymptomatic  General: Well-developed, well-nourished, no acute distress. Eyes: Pink conjunctiva, anicteric sclera. HEENT: Normocephalic, moist mucous membranes. Lungs: Clear to auscultation bilaterally. Heart: Regular rate and rhythm. No rubs, murmurs, or gallops. Abdomen: Soft, nontender, nondistended. No organomegaly noted, normoactive bowel sounds. Musculoskeletal: No edema, cyanosis, or clubbing. Neuro: Alert, answering all questions appropriately. Cranial nerves grossly intact. Skin: No rashes or petechiae noted. Psych: Normal affect.  LAB RESULTS:  Lab Results  Component Value Date   NA 138 08/17/2016   K 3.6 08/17/2016   CL 101 08/17/2016   CO2 25 08/17/2016   GLUCOSE 81 08/17/2016   BUN 11 08/17/2016   CREATININE 0.88 08/17/2016   CALCIUM 9.4 08/17/2016   PROT 7.3 08/17/2016   ALBUMIN 4.0 08/17/2016   AST 25 08/17/2016   ALT 30 (H) 08/17/2016   ALKPHOS 85 08/17/2016   BILITOT 0.3 08/17/2016   GFRNONAA 64 08/04/2015   GFRAA 74 08/04/2015    Lab Results  Component Value Date   WBC 6.4 04/30/2018   NEUTROABS 3.9 04/30/2018   HGB 13.6 04/30/2018   HCT 41.8 04/30/2018   MCV 86.5 04/30/2018   PLT 227 04/30/2018     STUDIES: Dg Chest 2 View  Result Date: 04/03/2018 CLINICAL DATA:  Acute bronchitis.  Fever chills and cough EXAM: CHEST - 2 VIEW COMPARISON:  10/25/2009 FINDINGS: The heart size and mediastinal contours are within normal limits. Both lungs are clear. The visualized skeletal structures are unremarkable. IMPRESSION: No active cardiopulmonary disease. Electronically Signed   By: Franchot Gallo M.D.   On: 04/03/2018 15:28    ASSESSMENT: Bone marrow biopsy proven ITP.  PLAN:    1. ITP: Bone marrow biopsy performed on July 28, 2015 was reported as normal, therefore confirming a  diagnosis of ITP. Patient responded to steroids, but the results were not durable. She completed her first round of weekly Rituxan on Aug 30, 2015, her second treatment completed on November 23, 2016, and her third round of weekly Rituxan x4 completed on December 20, 2017.  If patient requires additional treatment in the future, she will need increased premedications since she had a mild reaction with cycle 1 during her most recent treatment cycle.  Patient's platelet count continues to be within normal limits at 227.  No intervention is needed at this time.  Return to clinic patient's platelet count continues to improve and is now 176.  Return to clinic on June 20, 2018 as previously scheduled.   2.  Fatigue: Slightly worse today.  Likely secondary to chronic infection. 3.  Back pain: Chronic and unchanged. Continue evaluation and treatment per primary care. 4.  Recurrent congestion/cough: Likely unrelated to her Rituxan treatments.  Patient has a normal IgG level of 800.   Patient expressed understanding and was in agreement with this  plan. She also understands that She can call clinic at any time with any questions, concerns, or complaints.   Lloyd Huger, MD   05/02/2018 6:39 AM

## 2018-04-30 ENCOUNTER — Inpatient Hospital Stay: Payer: Medicare HMO | Attending: Oncology

## 2018-04-30 ENCOUNTER — Inpatient Hospital Stay (HOSPITAL_BASED_OUTPATIENT_CLINIC_OR_DEPARTMENT_OTHER): Payer: Medicare HMO | Admitting: Oncology

## 2018-04-30 ENCOUNTER — Other Ambulatory Visit: Payer: Self-pay

## 2018-04-30 VITALS — BP 135/89 | HR 61 | Temp 97.8°F | Ht 63.0 in | Wt 174.8 lb

## 2018-04-30 DIAGNOSIS — Z90710 Acquired absence of both cervix and uterus: Secondary | ICD-10-CM

## 2018-04-30 DIAGNOSIS — I10 Essential (primary) hypertension: Secondary | ICD-10-CM | POA: Insufficient documentation

## 2018-04-30 DIAGNOSIS — D693 Immune thrombocytopenic purpura: Secondary | ICD-10-CM | POA: Insufficient documentation

## 2018-04-30 DIAGNOSIS — F17210 Nicotine dependence, cigarettes, uncomplicated: Secondary | ICD-10-CM | POA: Diagnosis not present

## 2018-04-30 DIAGNOSIS — Z79899 Other long term (current) drug therapy: Secondary | ICD-10-CM | POA: Insufficient documentation

## 2018-04-30 DIAGNOSIS — M549 Dorsalgia, unspecified: Secondary | ICD-10-CM | POA: Diagnosis not present

## 2018-04-30 DIAGNOSIS — R69 Illness, unspecified: Secondary | ICD-10-CM | POA: Diagnosis not present

## 2018-04-30 DIAGNOSIS — G8929 Other chronic pain: Secondary | ICD-10-CM | POA: Diagnosis not present

## 2018-04-30 LAB — CBC WITH DIFFERENTIAL/PLATELET
Abs Immature Granulocytes: 0.04 10*3/uL (ref 0.00–0.07)
BASOS PCT: 1 %
Basophils Absolute: 0 10*3/uL (ref 0.0–0.1)
Eosinophils Absolute: 0.3 10*3/uL (ref 0.0–0.5)
Eosinophils Relative: 5 %
HCT: 41.8 % (ref 36.0–46.0)
Hemoglobin: 13.6 g/dL (ref 12.0–15.0)
Immature Granulocytes: 1 %
Lymphocytes Relative: 26 %
Lymphs Abs: 1.7 10*3/uL (ref 0.7–4.0)
MCH: 28.2 pg (ref 26.0–34.0)
MCHC: 32.5 g/dL (ref 30.0–36.0)
MCV: 86.5 fL (ref 80.0–100.0)
MONO ABS: 0.4 10*3/uL (ref 0.1–1.0)
MONOS PCT: 6 %
Neutro Abs: 3.9 10*3/uL (ref 1.7–7.7)
Neutrophils Relative %: 61 %
PLATELETS: 227 10*3/uL (ref 150–400)
RBC: 4.83 MIL/uL (ref 3.87–5.11)
RDW: 14.6 % (ref 11.5–15.5)
WBC: 6.4 10*3/uL (ref 4.0–10.5)
nRBC: 0 % (ref 0.0–0.2)

## 2018-04-30 NOTE — Progress Notes (Signed)
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 Patient stated that three weeks ago she was diagnosed with the flu and then pneumonia. Patient was treated with antibiotics which she had finished. However, patient has a productive cough with a brown tint to it. Patient also stated that she started having acid reflux about three weeks but had not taken anything for it.

## 2018-05-01 LAB — IGG: IgG (Immunoglobin G), Serum: 800 mg/dL (ref 700–1600)

## 2018-05-16 ENCOUNTER — Other Ambulatory Visit: Payer: Self-pay

## 2018-05-16 ENCOUNTER — Encounter: Payer: Self-pay | Admitting: Nurse Practitioner

## 2018-05-16 ENCOUNTER — Ambulatory Visit (INDEPENDENT_AMBULATORY_CARE_PROVIDER_SITE_OTHER): Payer: Medicare HMO | Admitting: Nurse Practitioner

## 2018-05-16 VITALS — BP 130/71 | HR 68 | Temp 98.5°F | Resp 16 | Ht 63.0 in | Wt 176.4 lb

## 2018-05-16 DIAGNOSIS — Z20828 Contact with and (suspected) exposure to other viral communicable diseases: Secondary | ICD-10-CM | POA: Diagnosis not present

## 2018-05-16 DIAGNOSIS — R6883 Chills (without fever): Secondary | ICD-10-CM | POA: Diagnosis not present

## 2018-05-16 DIAGNOSIS — J40 Bronchitis, not specified as acute or chronic: Secondary | ICD-10-CM

## 2018-05-16 LAB — POCT INFLUENZA A/B
Influenza A, POC: NEGATIVE
Influenza B, POC: NEGATIVE

## 2018-05-16 MED ORDER — BALOXAVIR MARBOXIL(80 MG DOSE) 2 X 40 MG PO TBPK: 80.0000 mg | ORAL_TABLET | Freq: Once | ORAL | 0 refills | Status: AC

## 2018-05-16 MED ORDER — ALBUTEROL SULFATE HFA 108 (90 BASE) MCG/ACT IN AERS: 1.0000 | INHALATION_SPRAY | Freq: Four times a day (QID) | RESPIRATORY_TRACT | 11 refills | Status: DC | PRN

## 2018-05-16 NOTE — Progress Notes (Signed)
Subjective:    Patient ID: Erica Bartlett, female    DOB: Mar 16, 1956, 63 y.o.   MRN: [id number]  Erica Bartlett is a 63 y.o. female presenting on 05/16/2018 for Influenza (chills, fever past 2days. Pt exposed to the flu x2 people in her house recently diagnose. )   HPI Flu-like symptoms Cough, chest discomfort has returned after recent improvement after sinusitis resistant to treatment.  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  Started having fever, chills, or sweats over last 2 days.  Now also with body aches, productive cough, chest congestion, nausea.  No significant difficulty breathing, but is having some mild shortness of breath.   Social History   Tobacco Use  . Smoking status: Current Some Day Smoker    Packs/day: 0.15    Types: Cigarettes  . Smokeless tobacco: Former Systems developer  . Tobacco comment: 1 pack takes 3 weeks to finish   Substance Use Topics  . Alcohol use: No  . Drug use: No    Review of Systems Per HPI unless specifically indicated above     Objective:    BP 130/71   Pulse 68   Temp 98.5 F (36.9 C) (Oral)   Resp 16   Ht 5\' 3"  (1.6 m)   Wt 176 lb 6.4 oz (80 kg)   SpO2 100%   BMI 31.25 kg/m   Wt Readings from Last 3 Encounters:  05/16/18 176 lb 6.4 oz (80 kg)  04/30/18 174 lb 12.8 oz (79.3 kg)  04/18/18 178 lb (80.7 kg)    Physical Exam Vitals signs reviewed.  Constitutional:      Appearance: She is well-developed.  HENT:     Head: Normocephalic and atraumatic.     Right Ear: Hearing, tympanic membrane, ear canal and external ear normal.     Left Ear: Hearing, tympanic membrane, ear canal and external ear normal.     Nose: Mucosal edema and rhinorrhea present.     Right Sinus: Maxillary sinus tenderness and frontal sinus tenderness present.     Left Sinus: Maxillary sinus tenderness and frontal sinus tenderness present.     Mouth/Throat:     Lips: Pink.     Mouth: Mucous  membranes are moist.     Pharynx: Uvula midline. Pharyngeal swelling present. No oropharyngeal exudate (clear secretions) or uvula swelling.     Tonsils: No tonsillar exudate. Swelling: 1+ on the right. 1+ on the left.  Eyes:     General: Lids are normal.        Right eye: No discharge.        Left eye: No discharge.     Conjunctiva/sclera: Conjunctivae normal.     Pupils: Pupils are equal, round, and reactive to light.  Neck:     Musculoskeletal: Full passive range of motion without pain, normal range of motion and neck supple.  Cardiovascular:     Rate and Rhythm: Normal rate and regular rhythm.     Pulses: Normal pulses.     Heart sounds: Normal heart sounds, S1 normal and S2 normal.  Pulmonary:     Effort: Pulmonary effort is normal. No respiratory distress.     Breath sounds: Wheezing (mild expiratory) present. No rhonchi or rales.  Musculoskeletal:     Right lower leg: No edema.     Left lower leg: No edema.  Lymphadenopathy:     Cervical: Cervical adenopathy present.     Right cervical: Superficial cervical  adenopathy present.     Left cervical: Superficial cervical adenopathy present.  Skin:    General: Skin is warm and dry.     Capillary Refill: Capillary refill takes less than 2 seconds.  Neurological:     General: No focal deficit present.     Mental Status: She is alert.     GCS: GCS eye subscore is 4. GCS verbal subscore is 5. GCS motor subscore is 6.  Psychiatric:        Attention and Perception: Attention normal.        Mood and Affect: Mood normal.        Behavior: Behavior normal. Behavior is cooperative.      Results for orders placed or performed in visit on 05/16/18  POCT Influenza A/B  Result Value Ref Range   Influenza A, POC Negative Negative   Influenza B, POC Negative Negative      Assessment & Plan:   Problem List Items Addressed This Visit      Respiratory   Bronchitis   Relevant Medications   albuterol (PROVENTIL HFA;VENTOLIN HFA) 108  (90 Base) MCG/ACT inhaler    Other Visit Diagnoses    Exposure to influenza    -  Primary   Relevant Medications   albuterol (PROVENTIL HFA;VENTOLIN HFA) 108 (90 Base) MCG/ACT inhaler   Chills       Relevant Orders   POCT Influenza A/B (Completed)      Clinically diagnosed influenza despite negative rapid flu test today, concern for flu still due to significant known exposure to positive influenza at home and work. - Duration x 2 days, without complication. Tolerating PO and well hydrated - No other focal findings of infection today - S/p influenza vaccine this season  Plan: 1. START Xofluza 80 mg dose today x 1.  Patient went home and vomited after taking meds.   - Start Tamiflu 75mg  capsules BID x 5 days,  2. Supportive care as advised with NSAID / Tylenol PRN fever/myalgias, improve hydration, may take OTC Cold/Flu meds 3. Zofran ODT PRN nausea/vomiting - START albuterol 1-2 puffs every 4-6 hours as needed for wheezing and shortness of breath. 4. Return criteria given if significant worsening, consider post-influenza complications, otherwise follow-up if needed.  Will have low threshold for repeating Chest Xray, resuming levaquin, starting prednisone if worsening symptoms.   Meds ordered this encounter  Medications  . Baloxavir Marboxil,80 MG Dose, (XOFLUZA) 2 x 40 MG TBPK    Sig: Take 80 mg by mouth once for 1 dose.    Dispense:  2 each    Refill:  0    Order Specific Question:   Supervising Provider    Answer:   Olin Hauser [[id number]]  . albuterol (PROVENTIL HFA;VENTOLIN HFA) 108 (90 Base) MCG/ACT inhaler    Sig: Inhale 1-2 puffs into the lungs every 6 (six) hours as needed for wheezing or shortness of breath.    Dispense:  1 Inhaler    Refill:  11    Product selection permitted for brand based on insurance/patient preference.    Order Specific Question:   Supervising Provider    Answer:   Olin Hauser [[id number]]    Follow up plan: Return 5-7 days if  symptoms worsen or fail to improve.  Cassell Smiles, DNP, AGPCNP-BC Adult Gerontology Primary Care Nurse Practitioner [facility] 05/16/2018, 12:05 PM

## 2018-05-16 NOTE — Patient Instructions (Addendum)
Erica Bartlett,   Thank you for coming in to clinic today.  1. Continue albuterol 1-2 puffs every 4 hours as needed. 2. Take Xofluza 40 mg capsules.  Take 2 caps at one dose today. 3. Call clinic if any worsening.  We will repeat Levaquin and consider adding prednisone.  Also can do Xray if breathing is significantly worse.  If trouble breathing at rest, you will need to go to ER.  Please schedule a follow-up appointment with Cassell Smiles, AGNP. Return 5-7 days if symptoms worsen or fail to improve.  If you have any other questions or concerns, please feel free to call the clinic or send a message through [facility]. You may also schedule an earlier appointment if necessary.  You will receive a survey after today's visit either digitally by e-mail or paper by [employer]. Your experiences and feedback matter to [place].  Please respond so we know how we are doing as we provide care for you.   Cassell Smiles, DNP, AGNP-BC Adult Gerontology Nurse Practitioner [place]

## 2018-05-17 ENCOUNTER — Telehealth: Payer: Self-pay | Admitting: Nurse Practitioner

## 2018-05-17 DIAGNOSIS — Z20828 Contact with and (suspected) exposure to other viral communicable diseases: Secondary | ICD-10-CM

## 2018-05-17 DIAGNOSIS — J329 Chronic sinusitis, unspecified: Secondary | ICD-10-CM

## 2018-05-17 MED ORDER — OSELTAMIVIR PHOSPHATE 75 MG PO CAPS: 75.0000 mg | ORAL_CAPSULE | Freq: Every day | ORAL | 0 refills | Status: AC

## 2018-05-17 MED ORDER — LEVOFLOXACIN 500 MG PO TABS: 500.0000 mg | ORAL_TABLET | Freq: Every day | ORAL | 0 refills | Status: AC

## 2018-05-17 NOTE — Telephone Encounter (Signed)
The pt called complaining that she took her  XOFLUZA, but vomited 45 minutes after taking the medication. Pt also complaining that she is actually feeling worse congestion, fever and chills. She states you discussed with her about possible sending over an abx. Please advise

## 2018-05-17 NOTE — Telephone Encounter (Signed)
The pt was notified. No questions or concerns. 

## 2018-05-17 NOTE — Telephone Encounter (Signed)
I have sent tamiflu.  Start once daily for prophylaxis.  If worsening with body aches and fever, chills, sweats take twice daily.  Take full course of medication.  For recurrent sinusitis: sent levofloxacin 500 mg one tablet daily for 10 days.  Take full course of medication.

## 2018-05-20 ENCOUNTER — Telehealth: Payer: Self-pay | Admitting: Nurse Practitioner

## 2018-05-20 DIAGNOSIS — R112 Nausea with vomiting, unspecified: Secondary | ICD-10-CM

## 2018-05-20 DIAGNOSIS — J40 Bronchitis, not specified as acute or chronic: Secondary | ICD-10-CM

## 2018-05-20 MED ORDER — PREDNISONE 50 MG PO TABS: 50.0000 mg | ORAL_TABLET | Freq: Every day | ORAL | 0 refills | Status: AC

## 2018-05-20 MED ORDER — ONDANSETRON HCL 4 MG PO TABS: 4.0000 mg | ORAL_TABLET | Freq: Three times a day (TID) | ORAL | 0 refills | Status: DC | PRN

## 2018-05-20 NOTE — Telephone Encounter (Signed)
Feels very sick again.  Feels weakness, nausea with Tamiflu.  Having some wheezing, but not real significant trouble breathing.  Used incline and felt okay today.

## 2018-05-20 NOTE — Telephone Encounter (Signed)
Pt called said that she was not feeling good today, said that the way she was feeling she could not work. Please advise pt. Pt call back # is  [phone]

## 2018-05-23 ENCOUNTER — Encounter: Payer: Self-pay | Admitting: Nurse Practitioner

## 2018-05-27 ENCOUNTER — Other Ambulatory Visit: Payer: Self-pay | Admitting: Nurse Practitioner

## 2018-05-27 DIAGNOSIS — I10 Essential (primary) hypertension: Secondary | ICD-10-CM

## 2018-06-16 NOTE — Progress Notes (Signed)
[place]  Telephone:([phone]) [phone] Fax:([phone]) [phone]  ID: Erica Bartlett OB: 04-15-1956  MR#: [id number]  YKZ#:[id number]  Patient Care Team: Mikey College, NP as PCP - General (Nurse Practitioner) Chucky May, MD as Consulting Physician (Psychiatry) Lloyd Huger, MD as Consulting Physician (Oncology)  CHIEF COMPLAINT: ITP  INTERVAL HISTORY: Patient returns to clinic today for repeat laboratory work and further evaluation.  She has increased grief and depression secondary to the recent death of her son.  She otherwise feels well.  She does not complain of weakness or fatigue today.  She has no neurologic complaints. She denies any easy bleeding or bruising. She continues to have chronic back pain and difficulty sleeping. She has no neurologic complaints. She denies any chest pain or shortness of breath. She has no nausea, vomiting, constipation, or diarrhea. She has no urinary complaints.  Patient offers no further specific complaints today.  REVIEW OF SYSTEMS:    Review of Systems  Constitutional: Negative for fever, malaise/fatigue and weight loss.  HENT: Negative.  Negative for congestion.   Respiratory: Negative.  Negative for cough and shortness of breath.   Cardiovascular: Negative.  Negative for chest pain and leg swelling.  Gastrointestinal: Negative.  Negative for blood in stool, melena and nausea.  Genitourinary: Negative.  Negative for hematuria.  Musculoskeletal: Positive for back pain. Negative for joint pain.  Skin: Negative.  Negative for rash.  Neurological: Negative.  Negative for sensory change, focal weakness, weakness and headaches.  Endo/Heme/Allergies: Does not bruise/bleed easily.  Psychiatric/Behavioral: Positive for depression. The patient is not nervous/anxious and does not have insomnia.     As per HPI. Otherwise, a complete review of systems is negative.  PAST MEDICAL HISTORY: Past Medical History:  Diagnosis  Date  . Hypertension     PAST SURGICAL HISTORY: Past Surgical History:  Procedure Laterality Date  . ABDOMINAL HYSTERECTOMY    . BREAST BIOPSY Left    age 30 surgical bx neg  . CESAREAN SECTION    . cyst removed     breast, benign    FAMILY HISTORY: Reviewed and unchanged. No reported history of malignancy or chronic disease.     ADVANCED DIRECTIVES:    HEALTH MAINTENANCE: Social History   Tobacco Use  . Smoking status: Current Some Day Smoker    Packs/day: 0.15    Types: Cigarettes  . Smokeless tobacco: Former Systems developer  . Tobacco comment: 1 pack takes 3 weeks to finish   Substance Use Topics  . Alcohol use: No  . Drug use: No    Allergies  Allergen Reactions  . Doxycycline Nausea And Vomiting  . Penicillins Hives    Current Outpatient Medications  Medication Sig Dispense Refill  . acetaminophen (TYLENOL) 500 MG tablet Take 500 mg by mouth every 6 (six) hours as needed.    Marland Kitchen albuterol (PROVENTIL HFA;VENTOLIN HFA) 108 (90 Base) MCG/ACT inhaler Inhale 1-2 puffs into the lungs every 6 (six) hours as needed for wheezing or shortness of breath. 1 Inhaler 11  . amLODipine (NORVASC) 10 MG tablet TAKE 1 TABLET (10 MG TOTAL) BY MOUTH DAILY. 90 tablet 1  . cetirizine (ZYRTEC) 10 MG tablet Take 1 tablet (10 mg total) by mouth daily. 30 tablet 11  . clonazePAM (KLONOPIN) 1 MG tablet Take 1-2 mg by mouth 3 (three) times daily as needed. Reported on 10/11/2015    . diphenhydrAMINE (BENADRYL) 25 mg capsule Take 25 mg by mouth as needed.    . fluticasone (FLONASE) 50  MCG/ACT nasal spray Place 2 sprays into both nostrils daily. 16 g 11  . hydrochlorothiazide (HYDRODIURIL) 25 MG tablet Take 1 tablet (25 mg total) by mouth daily. 90 tablet 2  . ipratropium (ATROVENT) 0.06 % nasal spray Place 2 sprays into both nostrils 4 (four) times daily. For up to 5-7 days then stop. 15 mL 0  . ondansetron (ZOFRAN) 4 MG tablet Take 1 tablet (4 mg total) by mouth every 8 (eight) hours as needed for  nausea or vomiting. 20 tablet 0   No current facility-administered medications for this visit.     OBJECTIVE: Vitals:   06/20/18 1030  BP: 122/76  Pulse: 67  Temp: (!) 97.5 F (36.4 C)     Body mass index is 29.64 kg/m.    ECOG FS:0 - Asymptomatic  General: Well-developed, well-nourished, no acute distress. Eyes: Pink conjunctiva, anicteric sclera. HEENT: Normocephalic, moist mucous membranes. Lungs: Clear to auscultation bilaterally. Heart: Regular rate and rhythm. No rubs, murmurs, or gallops. Abdomen: Soft, nontender, nondistended. No organomegaly noted, normoactive bowel sounds. Musculoskeletal: No edema, cyanosis, or clubbing. Neuro: Alert, answering all questions appropriately. Cranial nerves grossly intact. Skin: No rashes or petechiae noted. Psych: Normal affect.  LAB RESULTS:  Lab Results  Component Value Date   NA 138 08/17/2016   K 3.6 08/17/2016   CL 101 08/17/2016   CO2 25 08/17/2016   GLUCOSE 81 08/17/2016   BUN 11 08/17/2016   CREATININE 0.88 08/17/2016   CALCIUM 9.4 08/17/2016   PROT 7.3 08/17/2016   ALBUMIN 4.0 08/17/2016   AST 25 08/17/2016   ALT 30 (H) 08/17/2016   ALKPHOS 85 08/17/2016   BILITOT 0.3 08/17/2016   GFRNONAA 64 08/04/2015   GFRAA 74 08/04/2015    Lab Results  Component Value Date   WBC 4.8 06/20/2018   NEUTROABS 3.0 06/20/2018   HGB 13.7 06/20/2018   HCT 41.4 06/20/2018   MCV 86.1 06/20/2018   PLT 211 06/20/2018     STUDIES: No results found.  ASSESSMENT: Bone marrow biopsy proven ITP.  PLAN:    1. ITP: Bone marrow biopsy performed on July 28, 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 Patient responded to steroids, but the results were not durable. She completed her first round of weekly Rituxan on Aug 30, 2015, her second treatment completed on November 23, 2016, and her third round of weekly Rituxan x4 completed on December 20, 2017.  If patient requires additional treatment in the future, she  will need increased premedications since she had a mild reaction with cycle 1 during her most recent treatment cycle.  Patient's platelet count continues to remain within normal limits at 211.  No intervention is needed at this time.  Return to clinic in 3 months for laboratory work only and then in 6 months for laboratory work and further evaluation.    2.  Fatigue: Resolved. 3.  Back pain: Chronic and unchanged. Continue evaluation and treatment per primary care. 4.  Recurrent congestion/cough: Resolved.  Patient has a normal IgG level of 800.  I spent a total of 20 minutes face-to-face with the patient of which greater than 50% of the visit was spent in counseling and coordination of care as detailed above. What was that Patient expressed understanding and was in agreement with this plan. She also understands that She can call clinic at any time with any questions, concerns, or complaints.   Lloyd Huger, MD   06/21/2018 10:54 PM

## 2018-06-20 ENCOUNTER — Other Ambulatory Visit: Payer: Self-pay

## 2018-06-20 ENCOUNTER — Inpatient Hospital Stay (HOSPITAL_BASED_OUTPATIENT_CLINIC_OR_DEPARTMENT_OTHER): Payer: Medicare HMO | Admitting: Oncology

## 2018-06-20 ENCOUNTER — Inpatient Hospital Stay: Payer: Medicare HMO | Attending: Oncology

## 2018-06-20 VITALS — BP 122/76 | HR 67 | Temp 97.5°F | Ht 63.0 in | Wt 167.3 lb

## 2018-06-20 DIAGNOSIS — F17210 Nicotine dependence, cigarettes, uncomplicated: Secondary | ICD-10-CM

## 2018-06-20 DIAGNOSIS — D693 Immune thrombocytopenic purpura: Secondary | ICD-10-CM | POA: Diagnosis not present

## 2018-06-20 DIAGNOSIS — G8929 Other chronic pain: Secondary | ICD-10-CM

## 2018-06-20 DIAGNOSIS — Z79899 Other long term (current) drug therapy: Secondary | ICD-10-CM | POA: Diagnosis not present

## 2018-06-20 DIAGNOSIS — F329 Major depressive disorder, single episode, unspecified: Secondary | ICD-10-CM | POA: Diagnosis not present

## 2018-06-20 DIAGNOSIS — R69 Illness, unspecified: Secondary | ICD-10-CM | POA: Diagnosis not present

## 2018-06-20 DIAGNOSIS — I10 Essential (primary) hypertension: Secondary | ICD-10-CM | POA: Insufficient documentation

## 2018-06-20 LAB — CBC WITH DIFFERENTIAL/PLATELET
Abs Immature Granulocytes: 0.01 10*3/uL (ref 0.00–0.07)
BASOS PCT: 0 %
Basophils Absolute: 0 10*3/uL (ref 0.0–0.1)
Eosinophils Absolute: 0.1 10*3/uL (ref 0.0–0.5)
Eosinophils Relative: 3 %
HCT: 41.4 % (ref 36.0–46.0)
Hemoglobin: 13.7 g/dL (ref 12.0–15.0)
IMMATURE GRANULOCYTES: 0 %
Lymphocytes Relative: 27 %
Lymphs Abs: 1.3 10*3/uL (ref 0.7–4.0)
MCH: 28.5 pg (ref 26.0–34.0)
MCHC: 33.1 g/dL (ref 30.0–36.0)
MCV: 86.1 fL (ref 80.0–100.0)
Monocytes Absolute: 0.3 10*3/uL (ref 0.1–1.0)
Monocytes Relative: 7 %
NEUTROS PCT: 63 %
Neutro Abs: 3 10*3/uL (ref 1.7–7.7)
PLATELETS: 211 10*3/uL (ref 150–400)
RBC: 4.81 MIL/uL (ref 3.87–5.11)
RDW: 14.6 % (ref 11.5–15.5)
WBC: 4.8 10*3/uL (ref 4.0–10.5)
nRBC: 0 % (ref 0.0–0.2)

## 2018-06-20 NOTE — Progress Notes (Signed)
Patient is here today to follow up on her Idiopathic thrombocytopenic purpura. Patient stated that she had been doing well.

## 2018-06-21 ENCOUNTER — Other Ambulatory Visit: Payer: Medicare HMO

## 2018-06-21 ENCOUNTER — Ambulatory Visit: Payer: Medicare HMO | Admitting: Oncology

## 2018-06-27 ENCOUNTER — Other Ambulatory Visit: Payer: Self-pay | Admitting: Nurse Practitioner

## 2018-06-27 DIAGNOSIS — I10 Essential (primary) hypertension: Secondary | ICD-10-CM

## 2018-08-26 ENCOUNTER — Encounter: Payer: Self-pay | Admitting: Family Medicine

## 2018-08-26 ENCOUNTER — Other Ambulatory Visit: Payer: Self-pay

## 2018-08-26 ENCOUNTER — Ambulatory Visit (INDEPENDENT_AMBULATORY_CARE_PROVIDER_SITE_OTHER): Payer: Medicare HMO | Admitting: Family Medicine

## 2018-08-26 DIAGNOSIS — D693 Immune thrombocytopenic purpura: Secondary | ICD-10-CM

## 2018-08-26 DIAGNOSIS — N3000 Acute cystitis without hematuria: Secondary | ICD-10-CM | POA: Diagnosis not present

## 2018-08-26 DIAGNOSIS — I10 Essential (primary) hypertension: Secondary | ICD-10-CM | POA: Diagnosis not present

## 2018-08-26 MED ORDER — CIPROFLOXACIN HCL 500 MG PO TABS: 500.0000 mg | ORAL_TABLET | Freq: Two times a day (BID) | ORAL | 0 refills | Status: DC

## 2018-08-26 NOTE — Assessment & Plan Note (Addendum)
Previously controlled HTN - Home BP readings mildly elevated now up to 130/80, previously 110-120/70s for her Attributed to life stressors - multiple    Plan:  1. Continue current BP regimen - HCTZ 25mg  daily, Amlodipine 10mg  daily - Advised IF STILL lightheaded among other symptoms and has mild BP 130/80s, she may TRY to reduce one pill by HALF - can cut HCTZ from 25 down to 12.5mg  if prefer to trial this, or can try half of Amlodipine - notify us 2. Encourage improved lifestyle - low sodium diet, regular exercise 3. Continue monitor BP outside office, bring readings to next visit, if persistently >140/90 or new symptoms notify office sooner 4. Follow-up within 2-4 weeks if need, or 3 months w/ PCP

## 2018-08-26 NOTE — Progress Notes (Signed)
Virtual Visit via Telephone The purpose of this virtual visit is to provide medical care while limiting exposure to the novel coronavirus (COVID19) for both patient and office staff.  Consent was obtained for phone visit:  Yes.   Answered questions that patient had about telehealth interaction:  Yes.   I discussed the limitations, risks, security and privacy concerns of performing an evaluation and management service by telephone. I also discussed with the patient that there may be a patient responsible charge related to this service. The patient expressed understanding and agreed to proceed.  Patient Location: Home Provider Location: [facility] (Office)  PCP is Cassell Smiles, AGPCNP-BC - I am currently covering during her maternity leave.   ---------------------------------------------------------------------- Chief Complaint  Patient presents with  . Hypertension    Saturday, 135/85, Sunday 132/81, and Monday 135/82 x 1 today. Pt think it could be stres related.  Pt state she's been feeling a little off balance like her bp is up.  . Urinary Frequency    urinary frequency, pressure x 1 week     S: Reviewed CMA documentation. I have called patient and gathered additional HPI as follows:  CHRONIC HTN: Reports normally BP 110-120/70s in past, now for past 1-2 week with elevated BP readings for her, ranging 130/80s, she has been occasionally off balance, even if sitting can feel occasional lightheadedness. She wears mask constantly and this is affecting Current Meds - HCTZ 25mg  daily, Amlodipine 10mg  daily   Reports good compliance, took meds today. Tolerating well, w/o complaints. Admits significant life stressor with caring for her mother who has been sick with diabetes and still working regularly Denies CP, dyspnea, HA, edema, dizziness / lightheadedness  Urinary Frequency / Pressure Reports that symptoms started x 1 week ago, with urinary frequency and pressure with  fullness, she is still voiding about normal amount, not reduced or too much. Now she is drinking a lot more water regularly. - Prior UTI, she says it feels similar - PCN allergy  ITP Followed by [facility] Heme/Onc, Dr Grayland Ormond, last visit 05/2018, previously finished chemo in 11/2017, she had followed with lab tests, and is on lab surveillance now. - She says sometimes if fatigue and lightheaded could be related, she will check back with Heme/Onc   Patient is currently working at the school, and also to grocery store. Denies any high risk travel to areas of current concern for COVID19. Denies any known or suspected exposure to person with or possibly with COVID19.  Denies any fevers, chills, sweats, body ache, cough, shortness of breath, sinus pain or pressure, headache, abdominal pain, diarrhea  Past Medical History:  Diagnosis Date  . Hypertension    Social History   Tobacco Use  . Smoking status: Current Some Day Smoker    Packs/day: 0.15    Types: Cigarettes  . Smokeless tobacco: Former Systems developer  . Tobacco comment: 1 pack takes 3 weeks to finish   Substance Use Topics  . Alcohol use: No  . Drug use: No    Current Outpatient Medications:  .  acetaminophen (TYLENOL) 500 MG tablet, Take 500 mg by mouth every 6 (six) hours as needed., Disp: , Rfl:  .  albuterol (PROVENTIL HFA;VENTOLIN HFA) 108 (90 Base) MCG/ACT inhaler, Inhale 1-2 puffs into the lungs every 6 (six) hours as needed for wheezing or shortness of breath., Disp: 1 Inhaler, Rfl: 11 .  amLODipine (NORVASC) 10 MG tablet, TAKE 1 TABLET (10 MG TOTAL) BY MOUTH DAILY., Disp: 90 tablet,  Rfl: 1 .  cetirizine (ZYRTEC) 10 MG tablet, Take 1 tablet (10 mg total) by mouth daily., Disp: 30 tablet, Rfl: 11 .  clonazePAM (KLONOPIN) 1 MG tablet, Take 1-2 mg by mouth 3 (three) times daily as needed. Reported on 10/11/2015, Disp: , Rfl:  .  diphenhydrAMINE (BENADRYL) 25 mg capsule, Take 25 mg by mouth as needed., Disp: , Rfl:  .  fluticasone  (FLONASE) 50 MCG/ACT nasal spray, Place 2 sprays into both nostrils daily., Disp: 16 g, Rfl: 11 .  hydrochlorothiazide (HYDRODIURIL) 25 MG tablet, TAKE 1 TABLET BY MOUTH EVERY DAY, Disp: 90 tablet, Rfl: 0 .  ondansetron (ZOFRAN) 4 MG tablet, Take 1 tablet (4 mg total) by mouth every 8 (eight) hours as needed for nausea or vomiting., Disp: 20 tablet, Rfl: 0 .  ciprofloxacin (CIPRO) 500 MG tablet, Take 1 tablet (500 mg total) by mouth 2 (two) times daily., Disp: 10 tablet, Rfl: 0 .  citalopram (CELEXA) 20 MG tablet, Take 20 mg by mouth daily., Disp: , Rfl:   Depression screen [facility] 2/9 08/26/2018 05/16/2018 04/18/2018  Decreased Interest 0 0 0  Down, Depressed, Hopeless 2 0 0  PHQ - 2 Score 2 0 0  Altered sleeping 3 - -  Tired, decreased energy 3 - -  Change in appetite 1 - -  Feeling bad or failure about yourself  1 - -  Trouble concentrating 1 - -  Moving slowly or fidgety/restless 0 - -  Suicidal thoughts 0 - -  PHQ-9 Score 11 - -  Difficult doing work/chores Not difficult at all - -    GAD 7 : Generalized Anxiety Score 08/26/2018  Nervous, Anxious, on Edge 1  Control/stop worrying 1  Worry too much - different things 1  Trouble relaxing 0  Restless 0  Easily annoyed or irritable 1  Afraid - awful might happen 1  Total GAD 7 Score 5  Anxiety Difficulty Not difficult at all    -------------------------------------------------------------------------- O: No physical exam performed due to remote telephone encounter.  Reviewed labs from last Heme apt 05/2018  Recent Results (from the past 2160 hour(s))  CBC with Differential/Platelet     Status: None   Collection Time: 06/20/18 10:01 AM  Result Value Ref Range   WBC 4.8 4.0 - 10.5 K/uL   RBC 4.81 3.87 - 5.11 MIL/uL   Hemoglobin 13.7 12.0 - 15.0 g/dL   HCT 41.4 36.0 - 46.0 %   MCV 86.1 80.0 - 100.0 fL   MCH 28.5 26.0 - 34.0 pg   MCHC 33.1 30.0 - 36.0 g/dL   RDW 14.6 11.5 - 15.5 %   Platelets 211 150 - 400 K/uL   nRBC 0.0 0.0 -  0.2 %   Neutrophils Relative % 63 %   Neutro Abs 3.0 1.7 - 7.7 K/uL   Lymphocytes Relative 27 %   Lymphs Abs 1.3 0.7 - 4.0 K/uL   Monocytes Relative 7 %   Monocytes Absolute 0.3 0.1 - 1.0 K/uL   Eosinophils Relative 3 %   Eosinophils Absolute 0.1 0.0 - 0.5 K/uL   Basophils Relative 0 %   Basophils Absolute 0.0 0.0 - 0.1 K/uL   Immature Granulocytes 0 %   Abs Immature Granulocytes 0.01 0.00 - 0.07 K/uL    Comment: Performed at [facility], [address] [place], [place] [address]    -------------------------------------------------------------------------- A&P:  Problem List Items Addressed This Visit    Hypertension - Primary    Previously controlled HTN -  Home BP readings mildly elevated now up to 130/80, previously 110-120/70s for her Attributed to life stressors - multiple    Plan:  1. Continue current BP regimen - HCTZ 25mg  daily, Amlodipine 10mg  daily - Advised IF STILL lightheaded among other symptoms and has mild BP 130/80s, she may TRY to reduce one pill by HALF - can cut HCTZ from 25 down to 12.5mg  if prefer to trial this, or can try half of Amlodipine - notify us 2. Encourage improved lifestyle - low sodium diet, regular exercise 3. Continue monitor BP outside office, bring readings to next visit, if persistently >140/90 or new symptoms notify office sooner 4. Follow-up within 2-4 weeks if need, or 3 months w/ PCP       Idiopathic thrombocytopenic purpura (HCC)    Previously stable, s/p chemo 11/2017 Followed by [facility] Dr Candise Bowens that she should contact them for further follow-up instructions from last visit, may need repeat labs or additional therapy, based on current symptoms lighteaded       Other Visit Diagnoses    Acute cystitis without hematuria       Relevant Medications   ciprofloxacin (CIPRO) 500 MG tablet     Clinical suspicion for UTI, given her report but unable to come in for UA sample or urine culture No concern for pyelo  today (no systemic symptoms, neg fever, back pain, n/v).  Plan: 1. Discussed options - agree to trial empiric UTI treatment with Cipro 500mg  BID x 5 days, has PCN allergy 2. Improve PO hydration 3. RTC if no improvement 1-2 weeks, red flags given to return sooner   Meds ordered this encounter  Medications  . ciprofloxacin (CIPRO) 500 MG tablet    Sig: Take 1 tablet (500 mg total) by mouth 2 (two) times daily.    Dispense:  10 tablet    Refill:  0    Follow-up: - Return in 1 week if not resolved UTI / HTN  Patient verbalizes understanding with the above medical recommendations including the limitation of remote medical advice.  Specific follow-up and call-back criteria were given for patient to follow-up or seek medical care more urgently if needed.   - Time spent in direct consultation with patient on phone: 11 minutes  [name], [facility] 08/26/2018, 10:31 AM

## 2018-08-26 NOTE — Assessment & Plan Note (Signed)
Previously stable, s/p chemo 11/2017 Followed by [facility] Dr Candise Bowens that she should contact them for further follow-up instructions from last visit, may need repeat labs or additional therapy, based on current symptoms lighteaded

## 2018-08-26 NOTE — Patient Instructions (Addendum)
Keep track of BP  130/80s is okay  Goal is < 140/90  If you are still lightheaded or dizzy or other symptoms - please follow up with Dr Grayland Ormond for [facility].  Also you can TRY to cut one of your BP pills in HALF - such as Hydrochlorothiazide - half a pill every day to see if this reduces any symptoms. OR you can try Amlodipine if you prefer  Follow-up as needed or with PCP in 3 months if improving   Please schedule a Follow-up Appointment to: Return in about 2 weeks (around 09/09/2018) for HTN UTI.  If you have any other questions or concerns, please feel free to call the office or send a message through [place]. You may also schedule an earlier appointment if necessary.  Additionally, you may be receiving a survey about your experience at our office within a few days to 1 week by e-mail or mail. We value your feedback.  [name], DO [place]

## 2018-09-04 ENCOUNTER — Telehealth: Payer: Self-pay | Admitting: Nurse Practitioner

## 2018-09-04 DIAGNOSIS — R112 Nausea with vomiting, unspecified: Secondary | ICD-10-CM

## 2018-09-04 MED ORDER — ONDANSETRON HCL 4 MG PO TABS: 4.0000 mg | ORAL_TABLET | Freq: Three times a day (TID) | ORAL | 1 refills | Status: DC | PRN

## 2018-09-04 NOTE — Telephone Encounter (Signed)
Pt called requesting refill on zofran [facility].

## 2018-09-24 ENCOUNTER — Other Ambulatory Visit: Payer: Self-pay

## 2018-09-24 ENCOUNTER — Ambulatory Visit: Payer: Medicare HMO

## 2018-09-25 ENCOUNTER — Inpatient Hospital Stay: Payer: Medicare HMO | Attending: Oncology

## 2018-09-25 ENCOUNTER — Other Ambulatory Visit: Payer: Self-pay

## 2018-09-25 DIAGNOSIS — Z79899 Other long term (current) drug therapy: Secondary | ICD-10-CM | POA: Diagnosis not present

## 2018-09-25 DIAGNOSIS — F329 Major depressive disorder, single episode, unspecified: Secondary | ICD-10-CM | POA: Diagnosis not present

## 2018-09-25 DIAGNOSIS — G8929 Other chronic pain: Secondary | ICD-10-CM | POA: Diagnosis not present

## 2018-09-25 DIAGNOSIS — I10 Essential (primary) hypertension: Secondary | ICD-10-CM | POA: Diagnosis not present

## 2018-09-25 DIAGNOSIS — R69 Illness, unspecified: Secondary | ICD-10-CM | POA: Diagnosis not present

## 2018-09-25 DIAGNOSIS — F17210 Nicotine dependence, cigarettes, uncomplicated: Secondary | ICD-10-CM | POA: Diagnosis not present

## 2018-09-25 DIAGNOSIS — D693 Immune thrombocytopenic purpura: Secondary | ICD-10-CM | POA: Diagnosis not present

## 2018-09-25 LAB — CBC WITH DIFFERENTIAL/PLATELET
Abs Immature Granulocytes: 0.01 10*3/uL (ref 0.00–0.07)
Basophils Absolute: 0 10*3/uL (ref 0.0–0.1)
Basophils Relative: 1 %
Eosinophils Absolute: 0.1 10*3/uL (ref 0.0–0.5)
Eosinophils Relative: 2 %
HCT: 41.2 % (ref 36.0–46.0)
Hemoglobin: 14 g/dL (ref 12.0–15.0)
Immature Granulocytes: 0 %
Lymphocytes Relative: 24 %
Lymphs Abs: 1.2 10*3/uL (ref 0.7–4.0)
MCH: 28.7 pg (ref 26.0–34.0)
MCHC: 34 g/dL (ref 30.0–36.0)
MCV: 84.6 fL (ref 80.0–100.0)
Monocytes Absolute: 0.3 10*3/uL (ref 0.1–1.0)
Monocytes Relative: 5 %
Neutro Abs: 3.5 10*3/uL (ref 1.7–7.7)
Neutrophils Relative %: 68 %
Platelets: 179 10*3/uL (ref 150–400)
RBC: 4.87 MIL/uL (ref 3.87–5.11)
RDW: 14.6 % (ref 11.5–15.5)
WBC: 5.1 10*3/uL (ref 4.0–10.5)
nRBC: 0 % (ref 0.0–0.2)

## 2018-10-01 ENCOUNTER — Telehealth: Payer: Self-pay | Admitting: Nurse Practitioner

## 2018-10-01 ENCOUNTER — Ambulatory Visit: Payer: Medicare HMO

## 2018-10-01 NOTE — Telephone Encounter (Signed)
°~  Tiffany sick lvm to reschedule 10/01/18 knb~ °

## 2018-10-04 DIAGNOSIS — Z87891 Personal history of nicotine dependence: Secondary | ICD-10-CM | POA: Diagnosis not present

## 2018-10-04 DIAGNOSIS — Z20828 Contact with and (suspected) exposure to other viral communicable diseases: Secondary | ICD-10-CM | POA: Diagnosis not present

## 2018-10-04 DIAGNOSIS — Z8249 Family history of ischemic heart disease and other diseases of the circulatory system: Secondary | ICD-10-CM | POA: Diagnosis not present

## 2018-10-04 DIAGNOSIS — Z833 Family history of diabetes mellitus: Secondary | ICD-10-CM | POA: Diagnosis not present

## 2018-10-04 DIAGNOSIS — F4323 Adjustment disorder with mixed anxiety and depressed mood: Secondary | ICD-10-CM | POA: Diagnosis not present

## 2018-10-04 DIAGNOSIS — J309 Allergic rhinitis, unspecified: Secondary | ICD-10-CM | POA: Diagnosis not present

## 2018-10-04 DIAGNOSIS — M1990 Unspecified osteoarthritis, unspecified site: Secondary | ICD-10-CM | POA: Diagnosis not present

## 2018-10-04 DIAGNOSIS — I10 Essential (primary) hypertension: Secondary | ICD-10-CM | POA: Diagnosis not present

## 2018-10-04 DIAGNOSIS — R69 Illness, unspecified: Secondary | ICD-10-CM | POA: Diagnosis not present

## 2018-10-04 DIAGNOSIS — G8929 Other chronic pain: Secondary | ICD-10-CM | POA: Diagnosis not present

## 2018-10-15 ENCOUNTER — Other Ambulatory Visit: Payer: Self-pay

## 2018-10-15 ENCOUNTER — Ambulatory Visit (INDEPENDENT_AMBULATORY_CARE_PROVIDER_SITE_OTHER): Payer: Medicare HMO | Admitting: Family Medicine

## 2018-10-15 ENCOUNTER — Encounter: Payer: Self-pay | Admitting: Family Medicine

## 2018-10-15 DIAGNOSIS — J0100 Acute maxillary sinusitis, unspecified: Secondary | ICD-10-CM | POA: Diagnosis not present

## 2018-10-15 MED ORDER — LEVOFLOXACIN 500 MG PO TABS: 500.0000 mg | ORAL_TABLET | Freq: Every day | ORAL | 0 refills | Status: DC

## 2018-10-15 MED ORDER — PREDNISONE 50 MG PO TABS: 50.0000 mg | ORAL_TABLET | Freq: Every day | ORAL | 0 refills | Status: DC

## 2018-10-15 NOTE — Progress Notes (Signed)
Virtual Visit via Telephone The purpose of this virtual visit is to provide medical care while limiting exposure to the novel coronavirus (COVID19) for both patient and office staff.  Consent was obtained for phone visit:  Yes.   Answered questions that patient had about telehealth interaction:  Yes.   I discussed the limitations, risks, security and privacy concerns of performing an evaluation and management service by telephone. I also discussed with the patient that there may be a patient responsible charge related to this service. The patient expressed understanding and agreed to proceed.  Patient Location: Home Provider Location: [facility])   ---------------------------------------------------------------------- Chief Complaint  Patient presents with  . Sinusitis    head and chest congestion ,yellowish thick mucus onset 4 days SOB, sore throat from coughing low grade fever OTC tylenol taken as per patient his Fredrich Birks form work was tested positive 14 days ago    S: Reviewed CMA documentation. I have called patient and gathered additional HPI as follows:  SINUSITIS Reports that symptoms started 4 days ago with acute onset worsening sinus symptoms, similar to prior sinusitis episodes in past. She says does not feel different. - Tried OTC Flonase, Mucinex, robitussin  Denies any high risk travel to areas of current concern for COVID19. Denies any known or suspected exposure to person with or possibly with COVID19 - she denies actual contact with boss as mentioned above in HPI nursing note.  Denies any fevers, chills, sweats, body ache, headache, abdominal pain, diarrhea  -------------------------------------------------------------------------- O: No physical exam performed due to remote telephone encounter.  -------------------------------------------------------------------------- A&P:  Suspected Acute Sinusitis, possible for benign viral etiology at onset  - now concern with progression of symptoms concern bacterial infection. - Reassuring without high risk symptoms - Afebrile, without dyspnea - Immunesuppression at risk for COVID complication  1. Proceed with [facility] testing as advised, she declined [place] testing site 2. Start Levaquin 500 daily x 7 days 3. Start Prednisone 50 daily x 5 days 4. OTC medication for congestion 5. Follow-up  Meds ordered this encounter  Medications  . levofloxacin (LEVAQUIN) 500 MG tablet    Sig: Take 1 tablet (500 mg total) by mouth daily. For 7 days    Dispense:  7 tablet    Refill:  0  . predniSONE (DELTASONE) 50 MG tablet    Sig: Take 1 tablet (50 mg total) by mouth daily with breakfast.    Dispense:  5 tablet    Refill:  0    REQUIRED self quarantine to [place] - advised to avoid all exposure with others while during treatment. Should continue to quarantine for up to 7-14 days, pending resolution of symptoms, if symptoms resolve by 7 days and is afebrile >3 days - may STOP self quarantine at that time.    If symptoms do not resolve or significantly improve OR if WORSENING - fever / cough - or worsening shortness of breath - then should contact us and seek advice on next steps in treatment at home vs where/when to seek care at Urgent Care or Hospital ED for further intervention and possible testing if indicated.  Patient verbalizes understanding with the above medical recommendations including the limitation of remote medical advice.  Specific follow-up / call-back criteria were given for patient to follow-up or seek medical care more urgently if needed.   - Time spent in direct consultation with patient on phone: 11 minutes   [name], [place]  [facility] 10/15/2018, 4:06 PM

## 2018-10-16 NOTE — Patient Instructions (Signed)
Treat for sinusitis Levaquin, Prednisone OTC medications as need [facility] as advised Remain in quarantine while pending test result If not improving follow-up, may warrant chest x-ray in future  Please schedule a Follow-up Appointment to: No follow-ups on file.  If you have any other questions or concerns, please feel free to call the office or send a message through [place]. You may also schedule an earlier appointment if necessary.  Additionally, you may be receiving a survey about your experience at our office within a few days to 1 week by e-mail or mail. We value your feedback.  Erica Putnam, DO [place]

## 2018-11-12 ENCOUNTER — Other Ambulatory Visit: Payer: Self-pay

## 2018-11-12 ENCOUNTER — Encounter: Payer: Self-pay | Admitting: Family Medicine

## 2018-11-12 ENCOUNTER — Ambulatory Visit (INDEPENDENT_AMBULATORY_CARE_PROVIDER_SITE_OTHER): Payer: Medicare HMO | Admitting: Family Medicine

## 2018-11-12 DIAGNOSIS — J320 Chronic maxillary sinusitis: Secondary | ICD-10-CM | POA: Diagnosis not present

## 2018-11-12 MED ORDER — PREDNISONE 50 MG PO TABS: 50.0000 mg | ORAL_TABLET | Freq: Every day | ORAL | 0 refills | Status: DC

## 2018-11-12 MED ORDER — AMOXICILLIN-POT CLAVULANATE 875-125 MG PO TABS: 1.0000 | ORAL_TABLET | Freq: Two times a day (BID) | ORAL | 0 refills | Status: DC

## 2018-11-12 NOTE — Progress Notes (Signed)
Virtual Visit via Telephone The purpose of this virtual visit is to provide medical care while limiting exposure to the novel coronavirus (COVID19) for both patient and office staff.  Consent was obtained for phone visit:  Yes.   Answered questions that patient had about telehealth interaction:  Yes.   I discussed the limitations, risks, security and privacy concerns of performing an evaluation and management service by telephone. I also discussed with the patient that there may be a patient responsible charge related to this service. The patient expressed understanding and agreed to proceed.  Patient Location: Home Provider Location: [facility] (Office)  PCP is Cassell Smiles, AGPCNP-BC    ---------------------------------------------------------------------- Chief Complaint  Patient presents with  . Sinusitis    onset cough but denies fever follow up from last visit but no improvement as per patient have thick greenish mucus    S: Reviewed CMA documentation. I have called patient and gathered additional HPI as follows:  Recurrent SINUSITIS - Last visit with me virtual 10/15/18, for same problem, treated with Levaquin and Prednisone similar to prior for sinusitis issues, see prior notes for background information. - Interval update with improvement in symptoms up to 12 days then worsening sinus symptoms productive sinus thick green mucus - Today patient reports worsening symptom again similar to past with recurrent episodes of chronic sinusitis. She has not been able to return to ENT due to financial reasons in past. She has taken Augmentin in past even though PCN allergy, she is interested in repeat round of antibiotics, says usually takes a 2nd round to help treat her sinus infection due to immune system reduced - Tried OTC Flonase, Mucinex, robitussin  Denies any high risk travel to areas of current concern for COVID19. Denies any known or suspected exposure to  person with or possibly with COVID19 - she denies actual contact with boss as mentioned above in HPI nursing note.  Denies any fevers, chills, sweats, body ache, headache, abdominal pain, diarrhea   -------------------------------------------------------------------------- O: No physical exam performed due to remote telephone encounter.  -------------------------------------------------------------------------- A&P:  Suspected Acute on chronic recurrent Sinusitis, possible for benign viral etiology at onset - now concern with progression of symptoms, consider 2nd sickening and cannot rule out bacterial infection. - Reassuring without high risk symptoms - Afebrile, without dyspnea - has some immune deficiency  1. Repeat antibiotic given past experiences with similar recurrent sinusitis - Augmentin BID x 10 days, repeat Prednisone 50mg  daily x 5 day 2. Use other OTC sinus medicines as she is 3. F/u with hematology as planned 4. Future still reconsider ENT if able to get re-established  No orders of the defined types were placed in this encounter.  OPTIONAL RECOMMENDED self quarantine for patient safety for PREVENTION ONLY. It is not required based on current clinical symptoms. If they were to develop fever or worsening shortness of breath, then emphasis on REQUIRED quarantine for up to 7-14 days that could be resolved if fever free >3 days AND if symptoms improving after 7 days.   If symptoms do not resolve or significantly improve OR if WORSENING - fever / cough - or worsening shortness of breath - then should contact us and seek advice on next steps in treatment at home vs where/when to seek care at Urgent Care or Hospital ED for further intervention and possible testing if indicated.  Patient verbalizes understanding with the above medical recommendations including the limitation of remote medical advice.  Specific follow-up / call-back criteria were  given for patient to follow-up or  seek medical care more urgently if needed.   - Time spent in direct consultation with patient on phone: 11 minutes  [name], [facility] 11/12/2018, 4:17 PM

## 2018-11-12 NOTE — Patient Instructions (Addendum)
Treat for sinusitis Augmentin, Prednisone OTC medications as need  Follow-up if not improving  Please schedule a Follow-up Appointment to: Return in about 1 week (around 11/19/2018), or if symptoms worsen or fail to improve, for sinusitis.  If you have any other questions or concerns, please feel free to call the office or send a message through [place]. You may also schedule an earlier appointment if necessary.  Additionally, you may be receiving a survey about your experience at our office within a few days to 1 week by e-mail or mail. We value your feedback.  [name], DO [place]

## 2018-11-20 ENCOUNTER — Other Ambulatory Visit: Payer: Self-pay | Admitting: Nurse Practitioner

## 2018-11-20 DIAGNOSIS — I10 Essential (primary) hypertension: Secondary | ICD-10-CM

## 2018-12-07 ENCOUNTER — Other Ambulatory Visit: Payer: Self-pay | Admitting: Nurse Practitioner

## 2018-12-07 DIAGNOSIS — I10 Essential (primary) hypertension: Secondary | ICD-10-CM

## 2018-12-17 ENCOUNTER — Ambulatory Visit (INDEPENDENT_AMBULATORY_CARE_PROVIDER_SITE_OTHER): Payer: Medicare HMO

## 2018-12-17 VITALS — BP 127/71 | Ht 63.5 in | Wt 174.0 lb

## 2018-12-17 DIAGNOSIS — Z1211 Encounter for screening for malignant neoplasm of colon: Secondary | ICD-10-CM | POA: Diagnosis not present

## 2018-12-17 DIAGNOSIS — Z1239 Encounter for other screening for malignant neoplasm of breast: Secondary | ICD-10-CM | POA: Diagnosis not present

## 2018-12-17 DIAGNOSIS — Z0000 Encounter for general adult medical examination without abnormal findings: Secondary | ICD-10-CM | POA: Diagnosis not present

## 2018-12-17 NOTE — Progress Notes (Signed)
Subjective:   Erica Bartlett is a 63 y.o. female who presents for Medicare Annual (Subsequent) preventive examination.  This visit is being conducted via phone call  - after an attmept to do on video chat - due to the COVID-19 pandemic. This patient has given me verbal consent via phone to conduct this visit, patient states they are participating from their home address. Some vital signs may be absent or patient reported.   Patient identification: identified by name, DOB, and current address.    Review of Systems:   Cardiac Risk Factors include: advanced age (>16men, >62 women)     Objective:     Vitals: BP 127/71 Comment: pt reported  Ht 5' 3.5" (1.613 m) Comment: pt reported  Wt 174 lb (78.9 kg) Comment: pt reported  BMI 30.34 kg/m   Body mass index is 30.34 kg/m.  Advanced Directives 12/17/2018 06/20/2018 04/30/2018 12/20/2017 12/13/2017 12/06/2017 11/29/2017  Does Patient Have a Medical Advance Directive? No Yes Yes Yes No Yes Yes  Type of Advance Directive - Living will;Healthcare Power of Attorney Living will;Healthcare Power of Attorney Living will;Healthcare Power of [place];Living will -  Does patient want to make changes to medical advance directive? - No - Patient declined No - Patient declined No - Patient declined No - Patient declined No - Patient declined -  Copy of [place] in Chart? - Yes - validated most recent copy scanned in chart (See row information) No - copy requested No - copy requested - No - copy requested -  Would patient like information on creating a medical advance directive? - No - Patient declined No - Patient declined - - No - Patient declined -    Tobacco Social History   Tobacco Use  Smoking Status Current Some Day Smoker  . Packs/day: 0.15  . Types: Cigarettes  Smokeless Tobacco Never Used  Tobacco Comment   1 pack takes 3.5-4 weeks to finish      Ready to quit: Yes Counseling given:  Yes Comment: 1 pack takes 3.5-4 weeks to finish    Clinical Intake:  Pre-visit preparation completed: Yes  Pain : 0-10 Pain Type: Acute pain Pain Location: Shoulder Pain Orientation: Right(left wrist) Pain Descriptors / Indicators: Aching, Throbbing Pain Onset: 1 to 4 weeks ago Pain Frequency: Intermittent Pain Relieving Factors: tyl  Pain Relieving Factors: tyl  Nutritional Status: BMI > 30  Obese Nutritional Risks: None Diabetes: No  How often do you need to have someone help you when you read instructions, pamphlets, or other written materials from your doctor or pharmacy?: 1 - Never  Interpreter Needed?: No  Information entered by :: T.Adylynn Hertenstein,LPN  Past Medical History:  Diagnosis Date  . Hypertension    Past Surgical History:  Procedure Laterality Date  . ABDOMINAL HYSTERECTOMY    . BREAST BIOPSY Left    age 45 surgical bx neg  . CESAREAN SECTION    . cyst removed     breast, benign   Family History  Problem Relation Age of Onset  . Diabetes Mother   . Heart failure Father   . Kidney failure Father    Social History   Socioeconomic History  . Marital status: Widowed    Spouse name: Not on file  . Number of children: Not on file  . Years of education: Not on file  . Highest education level: Not on file  Occupational History    Comment: part time  Social  Needs  . Financial resource strain: Hard  . Food insecurity    Worry: Sometimes true    Inability: Sometimes true  . Transportation needs    Medical: Yes    Non-medical: No  Tobacco Use  . Smoking status: Current Some Day Smoker    Packs/day: 0.15    Types: Cigarettes  . Smokeless tobacco: Never Used  . Tobacco comment: 1 pack takes 3.5-4 weeks to finish   Substance and Sexual Activity  . Alcohol use: No  . Drug use: No  . Sexual activity: Yes    Birth control/protection: Post-menopausal  Lifestyle  . Physical activity    Days per week: 0 days    Minutes per session: 0 min  . Stress:  Only a little  Relationships  . Social connections    Talks on phone: More than three times a week    Gets together: More than three times a week    Attends religious service: More than 4 times per year    Active member of club or organization: Yes    Attends meetings of clubs or organizations: More than 4 times per year    Relationship status: Widowed  Other Topics Concern  . Not on file  Social History Narrative  . Not on file    Outpatient Encounter Medications as of 12/17/2018  Medication Sig  . acetaminophen (TYLENOL) 500 MG tablet Take 500 mg by mouth every 6 (six) hours as needed.  Marland Kitchen albuterol (PROVENTIL HFA;VENTOLIN HFA) 108 (90 Base) MCG/ACT inhaler Inhale 1-2 puffs into the lungs every 6 (six) hours as needed for wheezing or shortness of breath.  Marland Kitchen amLODipine (NORVASC) 10 MG tablet TAKE 1 TABLET BY MOUTH EVERY DAY  . cetirizine (ZYRTEC) 10 MG tablet Take 1 tablet (10 mg total) by mouth daily.  . clonazePAM (KLONOPIN) 1 MG tablet Take 1-2 mg by mouth 3 (three) times daily as needed. Reported on 10/11/2015  . diphenhydrAMINE (BENADRYL) 25 mg capsule Take 25 mg by mouth as needed.  . fluticasone (FLONASE) 50 MCG/ACT nasal spray Place 2 sprays into both nostrils daily.  . hydrochlorothiazide (HYDRODIURIL) 25 MG tablet TAKE 1 TABLET BY MOUTH EVERY DAY  . citalopram (CELEXA) 20 MG tablet Take 20 mg by mouth daily.  . ondansetron (ZOFRAN) 4 MG tablet Take 1 tablet (4 mg total) by mouth every 8 (eight) hours as needed for nausea or vomiting. (Patient not taking: Reported on 12/17/2018)  . [DISCONTINUED] amoxicillin-clavulanate (AUGMENTIN) 875-125 MG tablet Take 1 tablet by mouth 2 (two) times daily. For 10 days (Patient not taking: Reported on 12/17/2018)  . [DISCONTINUED] levofloxacin (LEVAQUIN) 500 MG tablet Take 1 tablet (500 mg total) by mouth daily. For 7 days (Patient not taking: Reported on 11/12/2018)  . [DISCONTINUED] metaxalone (SKELAXIN) 800 MG tablet Take by mouth.  .  [DISCONTINUED] predniSONE (DELTASONE) 50 MG tablet Take 1 tablet (50 mg total) by mouth daily with breakfast. (Patient not taking: Reported on 12/17/2018)   No facility-administered encounter medications on file as of 12/17/2018.     Activities of Daily Living In your present state of health, do you have any difficulty performing the following activities: 12/17/2018  Hearing? N  Vision? N  Comment eyeglasses, dr.bell annually  Difficulty concentrating or making decisions? N  Walking or climbing stairs? N  Dressing or bathing? Y  Comment due to shoulder pain  Doing errands, shopping? N  Preparing Food and eating ? N  Using the Toilet? N  In the past six months,  have you accidently leaked urine? N  Do you have problems with loss of bowel control? N  Managing your Medications? N  Managing your Finances? N  Housekeeping or managing your Housekeeping? N  Some recent data might be hidden    Patient Care Team: Mikey College, NP as PCP - General (Nurse Practitioner) Chucky May, MD as Consulting Physician (Psychiatry) Lloyd Huger, MD as Consulting Physician (Oncology)    Assessment:   This is a routine wellness examination for [place].  Exercise Activities and Dietary recommendations Current Exercise Habits: Home exercise routine, Type of exercise: walking, Time (Minutes): 10, Frequency (Times/Week): 7, Weekly Exercise (Minutes/Week): 70, Intensity: Mild, Exercise limited by: None identified  Goals    . Quit Smoking     Smoking cessation discussed       Fall Risk: Fall Risk  12/17/2018 11/12/2018 10/15/2018 04/18/2018 04/03/2018  Falls in the past year? 1 0 0 0 0  Number falls in past yr: 0 - - - -  Injury with Fall? 1 - - - -  Follow up Falls prevention discussed;Education provided Falls evaluation completed Falls evaluation completed Falls evaluation completed Falls evaluation completed    [place]:  Any stairs in or around  the home? Yes  If so, are there any without handrails? No   Home free of loose throw rugs in walkways, pet beds, electrical cords, etc? Yes  Adequate lighting in your home to reduce risk of falls? Yes   ASSISTIVE DEVICES UTILIZED TO PREVENT FALLS:  Life alert? No  Use of a cane, walker or w/c? No  Grab bars in the bathroom? Yes  Shower chair or bench in shower? No  Elevated toilet seat or a handicapped toilet? No   DME ORDERS:  DME order needed?  No   TIMED UP AND GO:  Unable to perform    Depression Screen PHQ 2/9 Scores 12/17/2018 11/12/2018 10/15/2018 08/26/2018  PHQ - 2 Score 1 - 2 2  PHQ- 9 Score - - 5 11  Exception Documentation - Patient refusal - -     Cognitive Function     6CIT Screen 09/18/2017  What Year? 0 points  What month? 0 points  What time? 0 points  Count back from 20 0 points  Months in reverse 0 points  Repeat phrase 0 points  Total Score 0    Immunization History  Administered Date(s) Administered  . Influenza,inj,Quad PF,6+ Mos 02/18/2015    Qualifies for Shingles Vaccine? Yes  Zostavax completed n/a. Due for Shingrix. Education has been provided regarding the importance of this vaccine. Pt has been advised to call insurance company to determine out of pocket expense. Advised may also receive vaccine at local pharmacy or Health Dept. Verbalized acceptance and understanding.  Tdap: up to date   Flu Vaccine: Due 12/2018  Pneumococcal Vaccine: not indicated   Screening Tests Health Maintenance  Topic Date Due  . Hepatitis C Screening  1956-04-19  . PAP SMEAR-Modifier  03/16/1979  . COLONOSCOPY  03/15/2008  . MAMMOGRAM  09/13/2018  . INFLUENZA VACCINE  11/23/2018  . TETANUS/TDAP  07/27/2024  . HIV Screening  Completed    Cancer Screenings:  Colorectal Screening: Cologuard ordered  Mammogram: ordered   Bone Density: not indicated   Lung Cancer Screening: (Low Dose CT Chest recommended if Age 49-80 years, 30 pack-year currently  smoking OR have quit w/in 15years.) does not qualify.     Additional Screening:  Hepatitis C Screening:  does qualify  Vision Screening: Recommended annual ophthalmology exams for early detection of glaucoma and other disorders of the eye. Is the patient up to date with their annual eye exam?  Yes  Who is the provider or what is the name of the office in which the pt attends annual eye exams? Dr. Gloriann Loan   Dental Screening: Recommended annual dental exams for proper oral hygiene  Community Resource Referral:  CRR required this visit?  No       Plan:  I have personally reviewed and addressed the Medicare Annual Wellness questionnaire and have noted the following in the patient's chart:  A. Medical and social history B. Use of alcohol, tobacco or illicit drugs  C. Current medications and supplements D. Functional ability and status E.  Nutritional status F.  Physical activity G. Advance directives H. List of other physicians I.  Hospitalizations, surgeries, and ER visits in previous 12 months J.  [place] such as hearing and vision if needed, cognitive and depression L. Referrals and appointments   In addition, I have reviewed and discussed with patient certain preventive protocols, quality metrics, and best practice recommendations. A written personalized care plan for preventive services as well as general preventive health recommendations were provided to patient.  Signed,    Bevelyn Ngo, LPN  [id number] Nurse Health Advisor   Nurse Notes: none

## 2018-12-17 NOTE — Patient Instructions (Signed)
Erica Bartlett , Thank you for taking time to come for your Medicare Wellness Visit. I appreciate your ongoing commitment to your health goals. Please review the following plan we discussed and let me know if I can assist you in the future.   Screening recommendations/referrals: Colonoscopy: cologuard ordered Mammogram: call and scheduled  Bone Density: not indicated  Recommended yearly ophthalmology/optometry visit for glaucoma screening and checkup Recommended yearly dental visit for hygiene and checkup  Vaccinations: Influenza vaccine: due now  Pneumococcal vaccine: not indicated Tdap vaccine: up to date Shingles vaccine: shingrix eligible     Advanced directives: please pick up a copy of this information next time you are in the office  Conditions/risks identified: If you wish to quit smoking, help is available. For free tobacco cessation program offerings call the [facility] at [phone] or Live Well Line at [phone]. You may also visit [email or web address] or email [email or web address] for more information on other programs.   Next appointment: Follow up in one year for your annual wellness visit   Preventive Care 40-64 Years, Female Preventive care refers to lifestyle choices and visits with your health care provider that can promote health and wellness. What does preventive care include?  A yearly physical exam. This is also called an annual well check.  Dental exams once or twice a year.  Routine eye exams. Ask your health care provider how often you should have your eyes checked.  Personal lifestyle choices, including:  Daily care of your teeth and gums.  Regular physical activity.  Eating a healthy diet.  Avoiding tobacco and drug use.  Limiting alcohol use.  Practicing safe sex.  Taking low-dose aspirin daily starting at age 64.  Taking vitamin and mineral supplements as recommended by your health care provider. What happens  during an annual well check? The services and screenings done by your health care provider during your annual well check will depend on your age, overall health, lifestyle risk factors, and family history of disease. Counseling  Your health care provider may ask you questions about your:  Alcohol use.  Tobacco use.  Drug use.  Emotional well-being.  Home and relationship well-being.  Sexual activity.  Eating habits.  Work and work Statistician.  Method of birth control.  Menstrual cycle.  Pregnancy history. Screening  You may have the following tests or measurements:  Height, weight, and BMI.  Blood pressure.  Lipid and cholesterol levels. These may be checked every 5 years, or more frequently if you are over 55 years old.  Skin check.  Lung cancer screening. You may have this screening every year starting at age 32 if you have a 30-pack-year history of smoking and currently smoke or have quit within the past 15 years.  Fecal occult blood test (FOBT) of the stool. You may have this test every year starting at age 42.  Flexible sigmoidoscopy or colonoscopy. You may have a sigmoidoscopy every 5 years or a colonoscopy every 10 years starting at age 41.  Hepatitis C blood test.  Hepatitis B blood test.  Sexually transmitted disease (STD) testing.  Diabetes screening. This is done by checking your blood sugar (glucose) after you have not eaten for a while (fasting). You may have this done every 1-3 years.  Mammogram. This may be done every 1-2 years. Talk to your health care provider about when you should start having regular mammograms. This may depend on whether you have a family history of breast cancer.  BRCA-related cancer  screening. This may be done if you have a family history of breast, ovarian, tubal, or peritoneal cancers.  Pelvic exam and Pap test. This may be done every 3 years starting at age 21. Starting at age 30, this may be done every 5 years if you  have a Pap test in combination with an HPV test.  Bone density scan. This is done to screen for osteoporosis. You may have this scan if you are at high risk for osteoporosis. Discuss your test results, treatment options, and if necessary, the need for more tests with your health care provider. Vaccines  Your health care provider may recommend certain vaccines, such as:  Influenza vaccine. This is recommended every year.  Tetanus, diphtheria, and acellular pertussis (Tdap, Td) vaccine. You may need a Td booster every 10 years.  Zoster vaccine. You may need this after age 60.  Pneumococcal 13-valent conjugate (PCV13) vaccine. You may need this if you have certain conditions and were not previously vaccinated.  Pneumococcal polysaccharide (PPSV23) vaccine. You may need one or two doses if you smoke cigarettes or if you have certain conditions. Talk to your health care provider about which screenings and vaccines you need and how often you need them. This information is not intended to replace advice given to you by your health care provider. Make sure you discuss any questions you have with your health care provider. Document Released: 05/07/2015 Document Revised: 12/29/2015 Document Reviewed: 02/09/2015 Elsevier Interactive Patient Education  2017 [employer]    Fall Prevention in the Home Falls can cause injuries. They can happen to people of all ages. There are many things you can do to make your home safe and to help prevent falls. What can I do on the outside of my home?  Regularly fix the edges of walkways and driveways and fix any cracks.  Remove anything that might make you trip as you walk through a door, such as a raised step or threshold.  Trim any bushes or trees on the path to your home.  Use bright outdoor lighting.  Clear any walking paths of anything that might make someone trip, such as rocks or tools.  Regularly check to see if handrails are loose or broken.  Make sure that both sides of any steps have handrails.  Any raised decks and porches should have guardrails on the edges.  Have any leaves, snow, or ice cleared regularly.  Use sand or salt on walking paths during winter.  Clean up any spills in your garage right away. This includes oil or grease spills. What can I do in the bathroom?  Use night lights.  Install grab bars by the toilet and in the tub and shower. Do not use towel bars as grab bars.  Use non-skid mats or decals in the tub or shower.  If you need to sit down in the shower, use a plastic, non-slip stool.  Keep the floor dry. Clean up any water that spills on the floor as soon as it happens.  Remove soap buildup in the tub or shower regularly.  Attach bath mats securely with double-sided non-slip rug tape.  Do not have throw rugs and other things on the floor that can make you trip. What can I do in the bedroom?  Use night lights.  Make sure that you have a light by your bed that is easy to reach.  Do not use any sheets or blankets that are too big for your bed. They should not   hang down onto the floor.  Have a firm chair that has side arms. You can use this for support while you get dressed.  Do not have throw rugs and other things on the floor that can make you trip. What can I do in the kitchen?  Clean up any spills right away.  Avoid walking on wet floors.  Keep items that you use a lot in easy-to-reach places.  If you need to reach something above you, use a strong step stool that has a grab bar.  Keep electrical cords out of the way.  Do not use floor polish or wax that makes floors slippery. If you must use wax, use non-skid floor wax.  Do not have throw rugs and other things on the floor that can make you trip. What can I do with my stairs?  Do not leave any items on the stairs.  Make sure that there are handrails on both sides of the stairs and use them. Fix handrails that are broken or  loose. Make sure that handrails are as long as the stairways.  Check any carpeting to make sure that it is firmly attached to the stairs. Fix any carpet that is loose or worn.  Avoid having throw rugs at the top or bottom of the stairs. If you do have throw rugs, attach them to the floor with carpet tape.  Make sure that you have a light switch at the top of the stairs and the bottom of the stairs. If you do not have them, ask someone to add them for you. What else can I do to help prevent falls?  Wear shoes that:  Do not have high heels.  Have rubber bottoms.  Are comfortable and fit you well.  Are closed at the toe. Do not wear sandals.  If you use a stepladder:  Make sure that it is fully opened. Do not climb a closed stepladder.  Make sure that both sides of the stepladder are locked into place.  Ask someone to hold it for you, if possible.  Clearly mark and make sure that you can see:  Any grab bars or handrails.  First and last steps.  Where the edge of each step is.  Use tools that help you move around (mobility aids) if they are needed. These include:  Canes.  Walkers.  Scooters.  Crutches.  Turn on the lights when you go into a dark area. Replace any light bulbs as soon as they burn out.  Set up your furniture so you have a clear path. Avoid moving your furniture around.  If any of your floors are uneven, fix them.  If there are any pets around you, be aware of where they are.  Review your medicines with your doctor. Some medicines can make you feel dizzy. This can increase your chance of falling. Ask your doctor what other things that you can do to help prevent falls. This information is not intended to replace advice given to you by your health care provider. Make sure you discuss any questions you have with your health care provider. Document Released: 02/04/2009 Document Revised: 09/16/2015 Document Reviewed: 05/15/2014 Elsevier Interactive  Patient Education  2017 [employer].

## 2018-12-19 ENCOUNTER — Ambulatory Visit (INDEPENDENT_AMBULATORY_CARE_PROVIDER_SITE_OTHER): Payer: Medicare HMO | Admitting: Licensed Clinical Social Worker

## 2018-12-19 ENCOUNTER — Other Ambulatory Visit: Payer: Self-pay

## 2018-12-19 DIAGNOSIS — Z599 Problem related to housing and economic circumstances, unspecified: Secondary | ICD-10-CM

## 2018-12-19 DIAGNOSIS — I10 Essential (primary) hypertension: Secondary | ICD-10-CM

## 2018-12-19 DIAGNOSIS — M797 Fibromyalgia: Secondary | ICD-10-CM

## 2018-12-19 DIAGNOSIS — Z598 Other problems related to housing and economic circumstances: Secondary | ICD-10-CM

## 2018-12-19 NOTE — Chronic Care Management (AMB) (Signed)
Chronic Care Management    Clinical Social Work General Note  12/19/2018 Name: TERRA AVENI MRN: [id number] DOB: 1955-11-14  Erica Bartlett is a 63 y.o. year old female who is a primary care patient of Mikey College, NP. The CCM was consulted to assist the patient with Financial Difficulties related to energy bill assistance.   Ms. Esperanza was given information about Chronic Care Management services today including:  1. CCM service includes personalized support from designated clinical staff supervised by her physician, including individualized plan of care and coordination with other care providers 2. 24/7 contact phone numbers for assistance for urgent and routine care needs. 3. Service will only be billed when office clinical staff spend 20 minutes or more in a month to coordinate care. 4. Only one practitioner may furnish and bill the service in a calendar month. 5. The patient may stop CCM services at any time (effective at the end of the month) by phone call to the office staff. 6. The patient will be responsible for cost sharing (co-pay) of up to 20% of the service fee (after annual deductible is met).  Patient agreed to services and verbal consent obtained.   Review of patient status, including review of consultants reports, relevant laboratory and other test results, and collaboration with appropriate care team members and the patient's provider was performed as part of comprehensive patient evaluation and provision of chronic care management services.    SDOH (Social Determinants of Health) screening performed today. See Care Plan Entry related to challenges with: Financial Strain   Outpatient Encounter Medications as of 12/19/2018  Medication Sig Note   acetaminophen (TYLENOL) 500 MG tablet Take 500 mg by mouth every 6 (six) hours as needed.    albuterol (PROVENTIL HFA;VENTOLIN HFA) 108 (90 Base) MCG/ACT inhaler Inhale 1-2 puffs into the lungs every 6  (six) hours as needed for wheezing or shortness of breath.    amLODipine (NORVASC) 10 MG tablet TAKE 1 TABLET BY MOUTH EVERY DAY    cetirizine (ZYRTEC) 10 MG tablet Take 1 tablet (10 mg total) by mouth daily.    citalopram (CELEXA) 20 MG tablet Take 20 mg by mouth daily.    clonazePAM (KLONOPIN) 1 MG tablet Take 1-2 mg by mouth 3 (three) times daily as needed. Reported on 10/11/2015    diphenhydrAMINE (BENADRYL) 25 mg capsule Take 25 mg by mouth as needed.    fluticasone (FLONASE) 50 MCG/ACT nasal spray Place 2 sprays into both nostrils daily. 09/18/2017: As needed   hydrochlorothiazide (HYDRODIURIL) 25 MG tablet TAKE 1 TABLET BY MOUTH EVERY DAY    ondansetron (ZOFRAN) 4 MG tablet Take 1 tablet (4 mg total) by mouth every 8 (eight) hours as needed for nausea or vomiting. (Patient not taking: Reported on 12/17/2018)    No facility-administered encounter medications on file as of 12/19/2018.     Goals Addressed     "I need financial assistance right now" (pt-stated)       Current Barriers:   Financial constraints related to affording energy bill  Limited social support  Lacks knowledge of community resource: emergency energy bill assistance   Clinical Social Work Clinical Goal(s):   Over the next 90 days, client will work with SW to address concerns related to gaining financial support in order to promote healthy living.   Over the next 90 days, patient will work with LCSW to address needs related to gaining emergency energy bill assistance.   Interventions:  Patient interviewed and appropriate assessments  performed  Provided patient with information about Energy Assistance Program through [place]- In an effort to keep our community safe from COVID-19, new procedures will be implemented to maintain social distancing guidelines and prevent large groups from gathering in our agency lobbies.  We will begin taking applications on Tuesday 10/29/18. To be potentially eligible for assistance,  you must have a condition that would put your life or health at risk without a cooling source.  You must also be able to meet the income guidelines.   We can only assist with the amount needed to avoid a cooling crisis up to $600 per state fiscal year.   The following procedures are in place:  Energy applications and interviews will not be conducted face-to-face.  Applications can be picked up at the main entrance of the DSS building, printed from our website, taken by phone or mailed per customer request.    Contact [phone] to place an application  If picking up your application at the [facility] building, you must return to your vehicle to complete the application.   Once completed, applications can be returned to [place] by:  Secured drop box located at the main entrance of Social Services  Mail to [address] [place], [place] [address]  Email at [email or web address]  Fax at [phone]  Additional information can be returned with your application such as income verification (last 4 paystubs if paid weekly, last 2 paystubs if paid biweekly) and energy bill information.  Once your application is received your interview will be conducted by telephone--your application will not be considered valid until an interview has been completed and a signed application received.  You will be contacted by an [phone] for an interview, so please include a valid contact phone number to ensure your interview can be completed.  Once the interview has been completed, your application will be processed.Per new state policy, you do not have to be at a point of disconnection to apply for services, you only need a past due bill. To inquire if funding is available, please call our information line at [phone].  [place] who are facing a disconnection of their utility or heating service may be eligible to receive a one time cash  grant from [address]. The funds are intended to help stop or prevent a crisis by qualified individuals. Call the [place]. at [phone] to apply for energy bill help or learn more.  [phone] can provide emergency financial assistance to qualified customers who are faced with a disconnection. Funds can be used for paying electric or heating bills. While funding is limited and it usually provided as a small dollar amount, the agency will do their best to help.   LCSW provided further education on other financial support resources within the nearby area that may be beneficial to her during this time. LCSW sent secure email with these financial support resources to patient on 12/19/2018.   Discussed plans with patient for ongoing care management follow up and provided patient with direct contact information for care management team  Advised patient to go to DSS to gain Emergency Assistance Application  Assisted patient/caregiver with obtaining information about health plan benefits  Patient Self Care Activities:   Attends all scheduled provider appointments  Calls provider office for new concerns or questions  Initial goal documentation     Follow Up Plan: SW will follow up  with patient by phone over the next 30-45 days     Eula Fried, [employer], MSW, [email or web address] Phone: [phone]

## 2018-12-21 NOTE — Progress Notes (Signed)
[facility]  [phone] [phone]  ID: Erica Bartlett OB: 05/09/1955  MR#: [id number]  CSN#:[id number]  Patient Care Team: Kennedy, Lauren Renee, NP as PCP - General (Nurse Practitioner) Kaur, Rupinder, MD as Consulting Physician (Psychiatry) Finnegan, Timothy J, MD as Consulting Physician (Oncology) Joyce, Brooke L, LCSW as Social Worker (Licensed Clinical Social Worker)  CHIEF COMPLAINT: ITP  INTERVAL HISTORY: Patient returns to clinic today for repeat laboratory work and further evaluation.  She currently feels well and is asymptomatic.  She does not complain of weakness or fatigue today.  She has no neurologic complaints. She denies any easy bleeding or bruising.  She continues to have chronic back pain.  She has no neurologic complaints.  She denies any chest pain, shortness of breath, cough, or hemoptysis.  She has no nausea, vomiting, constipation, or diarrhea. She has no urinary complaints.  Patient feels at her baseline offers no further specific complaints today.  REVIEW OF SYSTEMS:    Review of Systems  Constitutional: Negative for fever, malaise/fatigue and weight loss.  HENT: Negative.  Negative for congestion.   Respiratory: Negative.  Negative for cough and shortness of breath.   Cardiovascular: Negative.  Negative for chest pain and leg swelling.  Gastrointestinal: Negative.  Negative for blood in stool, melena and nausea.  Genitourinary: Negative.  Negative for hematuria.  Musculoskeletal: Positive for back pain. Negative for joint pain.  Skin: Negative.  Negative for rash.  Neurological: Negative.  Negative for sensory change, focal weakness, weakness and headaches.  Endo/Heme/Allergies: Does not bruise/bleed easily.  Psychiatric/Behavioral: Negative.  Negative for depression. The patient is not nervous/anxious and does not have insomnia.     As per HPI. Otherwise, a complete review of systems is negative.  PAST MEDICAL  HISTORY: Past Medical History:  Diagnosis Date  . Hypertension     PAST SURGICAL HISTORY: Past Surgical History:  Procedure Laterality Date  . ABDOMINAL HYSTERECTOMY    . BREAST BIOPSY Left    age 19 surgical bx neg  . CESAREAN SECTION    . cyst removed     breast, benign    FAMILY HISTORY: Reviewed and unchanged. No reported history of malignancy or chronic disease.     ADVANCED DIRECTIVES:    HEALTH MAINTENANCE: Social History   Tobacco Use  . Smoking status: Current Some Day Smoker    Packs/day: 0.15    Types: Cigarettes  . Smokeless tobacco: Never Used  . Tobacco comment: 1 pack takes 3.5-4 weeks to finish   Substance Use Topics  . Alcohol use: No  . Drug use: No    Allergies  Allergen Reactions  . Doxycycline Nausea And Vomiting  . Penicillins Hives    Current Outpatient Medications  Medication Sig Dispense Refill  . acetaminophen (TYLENOL) 500 MG tablet Take 500 mg by mouth every 6 (six) hours as needed.    . albuterol (PROVENTIL HFA;VENTOLIN HFA) 108 (90 Base) MCG/ACT inhaler Inhale 1-2 puffs into the lungs every 6 (six) hours as needed for wheezing or shortness of breath. 1 Inhaler 11  . cetirizine (ZYRTEC) 10 MG tablet Take 1 tablet (10 mg total) by mouth daily. 30 tablet 11  . citalopram (CELEXA) 20 MG tablet Take 20 mg by mouth as needed.     . clonazePAM (KLONOPIN) 1 MG tablet Take 1-2 mg by mouth 3 (three) times daily as needed. Reported on 10/11/2015    . diphenhydrAMINE (BENADRYL) 25 mg capsule Take 25 mg by mouth as needed.    .   fluticasone (FLONASE) 50 MCG/ACT nasal spray Place 2 sprays into both nostrils daily. 16 g 11  . hydrochlorothiazide (HYDRODIURIL) 25 MG tablet TAKE 1 TABLET BY MOUTH EVERY DAY 90 tablet 0  . ondansetron (ZOFRAN) 4 MG tablet Take 1 tablet (4 mg total) by mouth every 8 (eight) hours as needed for nausea or vomiting. 20 tablet 1  . amLODipine (NORVASC) 10 MG tablet Take 1 tablet (10 mg total) by mouth daily. **NEED APPT** 30  tablet 0   No current facility-administered medications for this visit.     OBJECTIVE: Vitals:   12/25/18 1116  BP: 126/71  Pulse: (!) 59  Resp: 16  Temp: 98 F (36.7 C)     Body mass index is 28.84 kg/m.    ECOG FS:0 - Asymptomatic  General: Well-developed, well-nourished, no acute distress. Eyes: Pink conjunctiva, anicteric sclera. HEENT: Normocephalic, moist mucous membranes. Lungs: Clear to auscultation bilaterally. Heart: Regular rate and rhythm. No rubs, murmurs, or gallops. Abdomen: Soft, nontender, nondistended. No organomegaly noted, normoactive bowel sounds. Musculoskeletal: No edema, cyanosis, or clubbing. Neuro: Alert, answering all questions appropriately. Cranial nerves grossly intact. Skin: No rashes or petechiae noted. Psych: Normal affect.  LAB RESULTS:  Lab Results  Component Value Date   NA 138 08/17/2016   K 3.6 08/17/2016   CL 101 08/17/2016   CO2 25 08/17/2016   GLUCOSE 81 08/17/2016   BUN 11 08/17/2016   CREATININE 0.88 08/17/2016   CALCIUM 9.4 08/17/2016   PROT 7.3 08/17/2016   ALBUMIN 4.0 08/17/2016   AST 25 08/17/2016   ALT 30 (H) 08/17/2016   ALKPHOS 85 08/17/2016   BILITOT 0.3 08/17/2016   GFRNONAA 64 08/04/2015   GFRAA 74 08/04/2015    Lab Results  Component Value Date   WBC 5.2 12/25/2018   NEUTROABS 3.5 12/25/2018   HGB 14.5 12/25/2018   HCT 42.6 12/25/2018   MCV 84.2 12/25/2018   PLT 118 (L) 12/25/2018     STUDIES: No results found.  ASSESSMENT: Bone marrow biopsy proven ITP.  PLAN:    1. ITP: Bone marrow biopsy performed on July 28, 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 Patient responded to steroids, but the results were not durable. She completed her first round of weekly Rituxan on Aug 30, 2015, her second treatment completed on November 23, 2016, and her third round of weekly Rituxan x4 completed on December 20, 2017.  If patient requires additional treatment in the future, she will need  increased premedications since she had a mild reaction with cycle 1 during her most recent treatment cycle.  Patient platelet count has trended down slightly to 118.  No intervention is needed at this time, but patient may require repeat Rituxan in the near future.  Return to clinic in 6 weeks for laboratory work only and then in 3 months with repeat laboratory work and further evaluation.     2.  Fatigue: Resolved. 3.  Back pain: Chronic and unchanged. Continue evaluation and treatment per primary care. 4.  Recurrent congestion/cough: Resolved.  Patient has a normal IgG level of 800.  I spent a total of 20 minutes face-to-face with the patient of which greater than 50% of the visit was spent in counseling and coordination of care as detailed above.  Patient expressed understanding and was in agreement with this plan. She also understands that She can call clinic at any time with any questions, concerns, or complaints.   Lloyd Huger, MD  12/26/2018 6:25 AM     

## 2018-12-22 ENCOUNTER — Other Ambulatory Visit: Payer: Self-pay | Admitting: Nurse Practitioner

## 2018-12-22 DIAGNOSIS — I10 Essential (primary) hypertension: Secondary | ICD-10-CM

## 2018-12-24 ENCOUNTER — Other Ambulatory Visit: Payer: Self-pay

## 2018-12-24 ENCOUNTER — Encounter: Payer: Self-pay | Admitting: Oncology

## 2018-12-24 NOTE — Progress Notes (Signed)
Patient stated that she would want the physician to look at a mole that she has on her right side of chest that had been growing. Patient stated that it's tender because she can not help to touch it daily.

## 2018-12-25 ENCOUNTER — Inpatient Hospital Stay: Payer: Medicare HMO | Attending: Oncology

## 2018-12-25 ENCOUNTER — Other Ambulatory Visit: Payer: Self-pay

## 2018-12-25 ENCOUNTER — Inpatient Hospital Stay (HOSPITAL_BASED_OUTPATIENT_CLINIC_OR_DEPARTMENT_OTHER): Payer: Medicare HMO | Admitting: Oncology

## 2018-12-25 VITALS — BP 126/71 | HR 59 | Temp 98.0°F | Resp 16 | Ht 63.5 in | Wt 165.4 lb

## 2018-12-25 DIAGNOSIS — I10 Essential (primary) hypertension: Secondary | ICD-10-CM | POA: Insufficient documentation

## 2018-12-25 DIAGNOSIS — Z79899 Other long term (current) drug therapy: Secondary | ICD-10-CM | POA: Diagnosis not present

## 2018-12-25 DIAGNOSIS — G8929 Other chronic pain: Secondary | ICD-10-CM | POA: Insufficient documentation

## 2018-12-25 DIAGNOSIS — Z90710 Acquired absence of both cervix and uterus: Secondary | ICD-10-CM | POA: Insufficient documentation

## 2018-12-25 DIAGNOSIS — F17210 Nicotine dependence, cigarettes, uncomplicated: Secondary | ICD-10-CM | POA: Diagnosis not present

## 2018-12-25 DIAGNOSIS — D693 Immune thrombocytopenic purpura: Secondary | ICD-10-CM | POA: Diagnosis not present

## 2018-12-25 DIAGNOSIS — M549 Dorsalgia, unspecified: Secondary | ICD-10-CM | POA: Insufficient documentation

## 2018-12-25 DIAGNOSIS — R69 Illness, unspecified: Secondary | ICD-10-CM | POA: Diagnosis not present

## 2018-12-25 LAB — CBC WITH DIFFERENTIAL/PLATELET
Abs Immature Granulocytes: 0.02 10*3/uL (ref 0.00–0.07)
Basophils Absolute: 0 10*3/uL (ref 0.0–0.1)
Basophils Relative: 1 %
Eosinophils Absolute: 0.2 10*3/uL (ref 0.0–0.5)
Eosinophils Relative: 4 %
HCT: 42.6 % (ref 36.0–46.0)
Hemoglobin: 14.5 g/dL (ref 12.0–15.0)
Immature Granulocytes: 0 %
Lymphocytes Relative: 20 %
Lymphs Abs: 1 10*3/uL (ref 0.7–4.0)
MCH: 28.7 pg (ref 26.0–34.0)
MCHC: 34 g/dL (ref 30.0–36.0)
MCV: 84.2 fL (ref 80.0–100.0)
Monocytes Absolute: 0.4 10*3/uL (ref 0.1–1.0)
Monocytes Relative: 7 %
Neutro Abs: 3.5 10*3/uL (ref 1.7–7.7)
Neutrophils Relative %: 68 %
Platelets: 118 10*3/uL — ABNORMAL LOW (ref 150–400)
RBC: 5.06 MIL/uL (ref 3.87–5.11)
RDW: 14.5 % (ref 11.5–15.5)
WBC: 5.2 10*3/uL (ref 4.0–10.5)
nRBC: 0 % (ref 0.0–0.2)

## 2019-01-23 ENCOUNTER — Ambulatory Visit (INDEPENDENT_AMBULATORY_CARE_PROVIDER_SITE_OTHER): Payer: Medicare HMO | Admitting: Licensed Clinical Social Worker

## 2019-01-23 DIAGNOSIS — I10 Essential (primary) hypertension: Secondary | ICD-10-CM | POA: Diagnosis not present

## 2019-01-23 DIAGNOSIS — Z598 Other problems related to housing and economic circumstances: Secondary | ICD-10-CM

## 2019-01-23 DIAGNOSIS — Z599 Problem related to housing and economic circumstances, unspecified: Secondary | ICD-10-CM

## 2019-01-23 NOTE — Chronic Care Management (AMB) (Signed)
Chronic Care Management    Clinical Social Work Follow Up Note  01/23/2019 Name: Erica Bartlett MRN: [id number] DOB: [birth year]  Erica Bartlett is a 63 y.o. year old female who is a primary care patient of Erica College, NP. The CCM team was consulted for assistance with Financial Difficulties related to manging bills/health care expenses.   Review of patient status, including review of consultants reports, other relevant assessments, and collaboration with appropriate care team members and the patient's provider was performed as part of comprehensive patient evaluation and provision of chronic care management services.    SDOH (Social Determinants of Health) screening performed today: Financial Strain . See Care Plan for related entries.   Outpatient Encounter Medications as of 01/23/2019  Medication Sig Note  . acetaminophen (TYLENOL) 500 MG tablet Take 500 mg by mouth every 6 (six) hours as needed.   Marland Kitchen albuterol (PROVENTIL HFA;VENTOLIN HFA) 108 (90 Base) MCG/ACT inhaler Inhale 1-2 puffs into the lungs every 6 (six) hours as needed for wheezing or shortness of breath.   Marland Kitchen amLODipine (NORVASC) 10 MG tablet Take 1 tablet (10 mg total) by mouth daily. **NEED APPT**   . cetirizine (ZYRTEC) 10 MG tablet Take 1 tablet (10 mg total) by mouth daily.   . citalopram (CELEXA) 20 MG tablet Take 20 mg by mouth as needed.    . clonazePAM (KLONOPIN) 1 MG tablet Take 1-2 mg by mouth 3 (three) times daily as needed. Reported on 10/11/2015   . diphenhydrAMINE (BENADRYL) 25 mg capsule Take 25 mg by mouth as needed.   . fluticasone (FLONASE) 50 MCG/ACT nasal spray Place 2 sprays into both nostrils daily. 09/18/2017: As needed  . hydrochlorothiazide (HYDRODIURIL) 25 MG tablet TAKE 1 TABLET BY MOUTH EVERY DAY   . ondansetron (ZOFRAN) 4 MG tablet Take 1 tablet (4 mg total) by mouth every 8 (eight) hours as needed for nausea or vomiting.    No facility-administered encounter medications on file as  of 01/23/2019.      Goals Addressed    . "I need financial assistance right now" (pt-stated)       Current Barriers:  . Financial constraints related to affording energy bill . Limited social support . Lacks knowledge of community resource: emergency energy bill assistance   Clinical Social Work Clinical Goal(s):  Marland Kitchen Over the next 90 days, client will work with SW to address concerns related to gaining financial support in order to promote healthy living.  . Over the next 90 days, patient will work with LCSW to address needs related to gaining emergency energy bill assistance.   Interventions: . Patient interviewed and appropriate assessments performed . Provided patient with information about Energy Assistance Program through [place]- In an effort to keep our community safe from COVID-19, new procedures will be implemented to maintain social distancing guidelines and prevent large groups from gathering in our agency lobbies.  We will begin taking applications on Tuesday 10/29/18. To be potentially eligible for assistance, you must have a condition that would put your life or health at risk without a cooling source.  You must also be able to meet the income guidelines.   We can only assist with the amount needed to avoid a cooling crisis up to $600 per state fiscal year. --UPDATE* Patient reports being unable to utilize this resource but could not express exactly as to why (stated they wanted her to pay some of the bill but she is not able to) . The following procedures are  in place: . Energy applications and interviews will not be conducted face-to-face. Marland Kitchen Applications can be picked up at the main entrance of the DSS building, printed from our website, taken by phone or mailed per customer request.   . Contact [phone] to place an application . If picking up your application at the [facility] building, you must return to your vehicle to complete the application.  . Once completed, applications can be  returned to [place] by: . Secured drop box located at the main entrance of [employer] . Mail to [address], [place] [address] . Email at [email or web address] . Fax at [phone] . Additional information can be returned with your application such as income verification (last 4 paystubs if paid weekly, last 2 paystubs if paid biweekly) and energy bill information. . Once your application is received your interview will be conducted by telephone-your application will not be considered valid until an interview has been completed and a signed application received.  You will be contacted by an Soil scientist for an interview, so please include a valid contact phone number to ensure your interview can be completed.  Once the interview has been completed, your application will be processed.Per new state policy, you do not have to be at a point of disconnection to apply for services, you only need a past due bill. To inquire if funding is available, please call our information line at [phone]. Marland Kitchen [place] who are facing a disconnection of their utility or heating service may be eligible to receive a one time cash grant from [employer]. The funds are intended to help stop or prevent a crisis by qualified individuals. Call the [place]. at 443-728-9753 to apply for energy bill help or learn more. . [employer] can provide emergency financial assistance to qualified customers who are faced with a disconnection. Funds can be used for paying electric or heating bills. While funding is limited and it usually provided as a small dollar amount, the agency will do their best to help.  Marland Kitchen LCSW provided further education on other financial support resources within the nearby area that may be beneficial to her during this time. LCSW sent secure email  with these financial support resources to patient on 12/19/2018. Patient confirms receiving these resources successfully on 01/23/2019. Resource education provided again during session today. Marland Kitchen LCSW will make a referral for C3 for financial strain. Patient agreeable to this referral and additional resource connection.  . Discussed plans with patient for ongoing care management follow up and provided patient with direct contact information for care management team . Advised patient to consider going to DSS to gain Emergency Assistance Application in person. . Assisted patient/caregiver with obtaining information about health plan benefits  Patient Self Care Activities:  . Attends all scheduled provider appointments . Calls provider office for new concerns or questions  Please see past updates related to this goal by clicking on the "Past Updates" button in the selected goal      Follow Up Plan: SW will follow up with patient by phone over the next quarter  Eula Fried, [place], MSW, [email or web address] Phone: [phone]

## 2019-01-31 ENCOUNTER — Ambulatory Visit (INDEPENDENT_AMBULATORY_CARE_PROVIDER_SITE_OTHER): Payer: Medicare HMO

## 2019-01-31 ENCOUNTER — Other Ambulatory Visit: Payer: Self-pay

## 2019-01-31 DIAGNOSIS — Z23 Encounter for immunization: Secondary | ICD-10-CM | POA: Diagnosis not present

## 2019-02-05 ENCOUNTER — Other Ambulatory Visit: Payer: Self-pay

## 2019-02-05 ENCOUNTER — Inpatient Hospital Stay: Payer: Medicare HMO | Attending: Oncology

## 2019-02-05 DIAGNOSIS — D693 Immune thrombocytopenic purpura: Secondary | ICD-10-CM | POA: Diagnosis not present

## 2019-02-05 DIAGNOSIS — I10 Essential (primary) hypertension: Secondary | ICD-10-CM | POA: Diagnosis not present

## 2019-02-05 DIAGNOSIS — Z79899 Other long term (current) drug therapy: Secondary | ICD-10-CM | POA: Diagnosis not present

## 2019-02-05 DIAGNOSIS — Z90710 Acquired absence of both cervix and uterus: Secondary | ICD-10-CM | POA: Diagnosis not present

## 2019-02-05 DIAGNOSIS — R69 Illness, unspecified: Secondary | ICD-10-CM | POA: Diagnosis not present

## 2019-02-05 DIAGNOSIS — F17210 Nicotine dependence, cigarettes, uncomplicated: Secondary | ICD-10-CM | POA: Diagnosis not present

## 2019-02-05 DIAGNOSIS — G8929 Other chronic pain: Secondary | ICD-10-CM | POA: Insufficient documentation

## 2019-02-05 DIAGNOSIS — M549 Dorsalgia, unspecified: Secondary | ICD-10-CM | POA: Diagnosis not present

## 2019-02-05 LAB — CBC WITH DIFFERENTIAL/PLATELET
Abs Immature Granulocytes: 0.02 10*3/uL (ref 0.00–0.07)
Basophils Absolute: 0 10*3/uL (ref 0.0–0.1)
Basophils Relative: 0 %
Eosinophils Absolute: 0.1 10*3/uL (ref 0.0–0.5)
Eosinophils Relative: 1 %
HCT: 41.2 % (ref 36.0–46.0)
Hemoglobin: 14 g/dL (ref 12.0–15.0)
Immature Granulocytes: 0 %
Lymphocytes Relative: 22 %
Lymphs Abs: 1.3 10*3/uL (ref 0.7–4.0)
MCH: 28.6 pg (ref 26.0–34.0)
MCHC: 34 g/dL (ref 30.0–36.0)
MCV: 84.1 fL (ref 80.0–100.0)
Monocytes Absolute: 0.3 10*3/uL (ref 0.1–1.0)
Monocytes Relative: 5 %
Neutro Abs: 4.3 10*3/uL (ref 1.7–7.7)
Neutrophils Relative %: 72 %
Platelets: 78 10*3/uL — ABNORMAL LOW (ref 150–400)
RBC: 4.9 MIL/uL (ref 3.87–5.11)
RDW: 14.3 % (ref 11.5–15.5)
WBC: 6.1 10*3/uL (ref 4.0–10.5)
nRBC: 0 % (ref 0.0–0.2)

## 2019-02-18 ENCOUNTER — Other Ambulatory Visit: Payer: Self-pay | Admitting: Nurse Practitioner

## 2019-02-18 DIAGNOSIS — I10 Essential (primary) hypertension: Secondary | ICD-10-CM

## 2019-02-21 NOTE — Progress Notes (Signed)
I have reviewed this encounter including the documentation in this note and/or discussed this patient with the provider. I am certifying that I agree with the content of this note as supervising physician.  [name], [facility] 02/21/2019, 11:12 PM

## 2019-03-06 DIAGNOSIS — F332 Major depressive disorder, recurrent severe without psychotic features: Secondary | ICD-10-CM | POA: Diagnosis not present

## 2019-03-06 DIAGNOSIS — R69 Illness, unspecified: Secondary | ICD-10-CM | POA: Diagnosis not present

## 2019-03-24 ENCOUNTER — Telehealth: Payer: Self-pay | Admitting: *Deleted

## 2019-03-24 DIAGNOSIS — D693 Immune thrombocytopenic purpura: Secondary | ICD-10-CM

## 2019-03-24 NOTE — Telephone Encounter (Signed)
What labs do you want on her? Just a CBC?

## 2019-03-24 NOTE — Telephone Encounter (Signed)
Yes and virtual visit later tin the day to discuss.

## 2019-03-24 NOTE — Telephone Encounter (Signed)
Please add lab orders, Pt is coming in tomorrow for labs and Virtual Visit on 12/4 for results.

## 2019-03-24 NOTE — Telephone Encounter (Signed)
Patient called asking to come in for lab check as she is having increased bruising. Please advise

## 2019-03-24 NOTE — Telephone Encounter (Signed)
Yes, just a cbc.  Thank you.

## 2019-03-25 ENCOUNTER — Inpatient Hospital Stay: Payer: Medicare HMO | Attending: Oncology

## 2019-03-25 ENCOUNTER — Other Ambulatory Visit: Payer: Self-pay

## 2019-03-25 DIAGNOSIS — M549 Dorsalgia, unspecified: Secondary | ICD-10-CM | POA: Insufficient documentation

## 2019-03-25 DIAGNOSIS — F17210 Nicotine dependence, cigarettes, uncomplicated: Secondary | ICD-10-CM | POA: Insufficient documentation

## 2019-03-25 DIAGNOSIS — D693 Immune thrombocytopenic purpura: Secondary | ICD-10-CM | POA: Insufficient documentation

## 2019-03-25 DIAGNOSIS — I10 Essential (primary) hypertension: Secondary | ICD-10-CM | POA: Insufficient documentation

## 2019-03-25 DIAGNOSIS — R69 Illness, unspecified: Secondary | ICD-10-CM | POA: Diagnosis not present

## 2019-03-25 DIAGNOSIS — Z79899 Other long term (current) drug therapy: Secondary | ICD-10-CM | POA: Diagnosis not present

## 2019-03-25 DIAGNOSIS — G8929 Other chronic pain: Secondary | ICD-10-CM | POA: Insufficient documentation

## 2019-03-25 LAB — CBC WITH DIFFERENTIAL/PLATELET
Abs Immature Granulocytes: 0.04 10*3/uL (ref 0.00–0.07)
Basophils Absolute: 0 10*3/uL (ref 0.0–0.1)
Basophils Relative: 0 %
Eosinophils Absolute: 0.2 10*3/uL (ref 0.0–0.5)
Eosinophils Relative: 3 %
HCT: 43.3 % (ref 36.0–46.0)
Hemoglobin: 14.3 g/dL (ref 12.0–15.0)
Immature Granulocytes: 1 %
Lymphocytes Relative: 18 %
Lymphs Abs: 1.2 10*3/uL (ref 0.7–4.0)
MCH: 28.6 pg (ref 26.0–34.0)
MCHC: 33 g/dL (ref 30.0–36.0)
MCV: 86.6 fL (ref 80.0–100.0)
Monocytes Absolute: 0.3 10*3/uL (ref 0.1–1.0)
Monocytes Relative: 4 %
Neutro Abs: 5.1 10*3/uL (ref 1.7–7.7)
Neutrophils Relative %: 74 %
Platelets: 96 10*3/uL — ABNORMAL LOW (ref 150–400)
RBC: 5 MIL/uL (ref 3.87–5.11)
RDW: 15 % (ref 11.5–15.5)
WBC: 7 10*3/uL (ref 4.0–10.5)
nRBC: 0 % (ref 0.0–0.2)

## 2019-03-27 NOTE — Progress Notes (Signed)
Patient prescreened for appointment. Patient has no concerns or questions.  

## 2019-03-28 ENCOUNTER — Ambulatory Visit (INDEPENDENT_AMBULATORY_CARE_PROVIDER_SITE_OTHER): Payer: Medicare HMO | Admitting: Family Medicine

## 2019-03-28 ENCOUNTER — Inpatient Hospital Stay (HOSPITAL_BASED_OUTPATIENT_CLINIC_OR_DEPARTMENT_OTHER): Payer: Medicare HMO | Admitting: Oncology

## 2019-03-28 ENCOUNTER — Encounter: Payer: Self-pay | Admitting: Family Medicine

## 2019-03-28 ENCOUNTER — Other Ambulatory Visit: Payer: Self-pay

## 2019-03-28 DIAGNOSIS — D693 Immune thrombocytopenic purpura: Secondary | ICD-10-CM

## 2019-03-28 DIAGNOSIS — J320 Chronic maxillary sinusitis: Secondary | ICD-10-CM | POA: Diagnosis not present

## 2019-03-28 MED ORDER — PREDNISONE 50 MG PO TABS: 50.0000 mg | ORAL_TABLET | Freq: Every day | ORAL | 0 refills | Status: DC

## 2019-03-28 MED ORDER — AMOXICILLIN-POT CLAVULANATE 875-125 MG PO TABS: 1.0000 | ORAL_TABLET | Freq: Two times a day (BID) | ORAL | 0 refills | Status: DC

## 2019-03-28 NOTE — Patient Instructions (Addendum)
Start Augmentin for 10 days, if any concern on allergic response, stop med and seek help sooner Start Prednisone steroid 50mg  daily x 5 days with food  Follow up as needed if not improving.  Please schedule a Follow-up Appointment to: Return if symptoms worsen or fail to improve, for sinusitis.  If you have any other questions or concerns, please feel free to call the office or send a message through [place]. You may also schedule an earlier appointment if necessary.  Additionally, you may be receiving a survey about your experience at our office within a few days to 1 week by e-mail or mail. We value your feedback.  [name], DO [place]

## 2019-03-28 NOTE — Progress Notes (Signed)
Virtual Visit via Telephone The purpose of this virtual visit is to provide medical care while limiting exposure to the novel coronavirus (COVID19) for both patient and office staff.  Consent was obtained for phone visit:  Yes.   Answered questions that patient had about telehealth interaction:  Yes.   I discussed the limitations, risks, security and privacy concerns of performing an evaluation and management service by telephone. I also discussed with the patient that there may be a patient responsible charge related to this service. The patient expressed understanding and agreed to proceed.  Patient Location: Home Provider Location: [facility])  ---------------------------------------------------------------------- Chief Complaint  Patient presents with  . Sinus Problem    congestion, fever, sweats, HA, nausea onset 4 days     S: Reviewed CMA documentation. I have called patient and gathered additional HPI as follows:  Recurrent SINUSITIS Last visit 7/21, augmentin, prednisone effective, she has had chronic recurrent sinusitis, did better recently no infection in >4 months. She also has followed w/ ENT. She had been doing well until recently she was outside in cold recently and may have triggered No sick contacts or concern for COVID19 Now noticed worsening sinus symptoms again  She may not been able to return to ENT due to financial reasons in past. She has taken Augmentin in past even though PCN allergy, she has been able to take Augmentin without allergy or other complication. - Tried OTCFlonase, Mucinex, robitussin  Denies any high risk travel to areas of current concern for COVID19. Denies any known or suspected exposure to person with or possibly with COVID19.  Denies any fevers, chills, sweats, body ache, cough, shortness of breath, abdominal pain, diarrhea  Past Medical History:  Diagnosis Date  . Hypertension    Social History   Tobacco Use   . Smoking status: Current Some Day Smoker    Packs/day: 0.15    Types: Cigarettes  . Smokeless tobacco: Never Used  . Tobacco comment: 1 pack takes 3.5-4 weeks to finish   Substance Use Topics  . Alcohol use: No  . Drug use: No    Current Outpatient Medications:  .  acetaminophen (TYLENOL) 500 MG tablet, Take 500 mg by mouth every 6 (six) hours as needed., Disp: , Rfl:  .  albuterol (PROVENTIL HFA;VENTOLIN HFA) 108 (90 Base) MCG/ACT inhaler, Inhale 1-2 puffs into the lungs every 6 (six) hours as needed for wheezing or shortness of breath., Disp: 1 Inhaler, Rfl: 11 .  amLODipine (NORVASC) 10 MG tablet, Take 1 tablet (10 mg total) by mouth daily. **NEED APPT**, Disp: 30 tablet, Rfl: 0 .  cetirizine (ZYRTEC) 10 MG tablet, Take 1 tablet (10 mg total) by mouth daily., Disp: 30 tablet, Rfl: 11 .  citalopram (CELEXA) 20 MG tablet, Take 20 mg by mouth as needed. , Disp: , Rfl:  .  clonazePAM (KLONOPIN) 1 MG tablet, Take 1-2 mg by mouth 3 (three) times daily as needed. Reported on 10/11/2015, Disp: , Rfl:  .  diphenhydrAMINE (BENADRYL) 25 mg capsule, Take 25 mg by mouth as needed., Disp: , Rfl:  .  fluticasone (FLONASE) 50 MCG/ACT nasal spray, Place 2 sprays into both nostrils daily., Disp: 16 g, Rfl: 11 .  hydrochlorothiazide (HYDRODIURIL) 25 MG tablet, TAKE 1 TABLET BY MOUTH EVERY DAY, Disp: 90 tablet, Rfl: 0 .  ondansetron (ZOFRAN) 4 MG tablet, Take 1 tablet (4 mg total) by mouth every 8 (eight) hours as needed for nausea or vomiting., Disp: 20 tablet, Rfl: 1 .  amoxicillin-clavulanate (AUGMENTIN) 875-125 MG tablet, Take 1 tablet by mouth 2 (two) times daily. For up to 10 days, Disp: 20 tablet, Rfl: 0 .  predniSONE (DELTASONE) 50 MG tablet, Take 1 tablet (50 mg total) by mouth daily with breakfast., Disp: 5 tablet, Rfl: 0  Depression screen [facility] 2/9 03/28/2019 12/17/2018 10/15/2018  Decreased Interest 0 0 1  Down, Depressed, Hopeless 0 1 1  PHQ - 2 Score 0 1 2  Altered sleeping - - 1  Tired,  decreased energy - - 1  Change in appetite - - 1  Feeling bad or failure about yourself  - - 0  Trouble concentrating - - 0  Moving slowly or fidgety/restless - - 0  Suicidal thoughts - - 0  PHQ-9 Score - - 5  Difficult doing work/chores - - Not difficult at all    GAD 7 : Generalized Anxiety Score 10/15/2018 08/26/2018  Nervous, Anxious, on Edge 1 1  Control/stop worrying 1 1  Worry too much - different things 1 1  Trouble relaxing 2 0  Restless 0 0  Easily annoyed or irritable 0 1  Afraid - awful might happen 1 1  Total GAD 7 Score 6 5  Anxiety Difficulty Not difficult at all Not difficult at all    -------------------------------------------------------------------------- O: No physical exam performed due to remote telephone encounter.  Lab results reviewed.  Recent Results (from the past 2160 hour(s))  CBC with Differential/Platelet     Status: Abnormal   Collection Time: 02/05/19 11:44 AM  Result Value Ref Range   WBC 6.1 4.0 - 10.5 K/uL   RBC 4.90 3.87 - 5.11 MIL/uL   Hemoglobin 14.0 12.0 - 15.0 g/dL   HCT 41.2 36.0 - 46.0 %   MCV 84.1 80.0 - 100.0 fL   MCH 28.6 26.0 - 34.0 pg   MCHC 34.0 30.0 - 36.0 g/dL   RDW 14.3 11.5 - 15.5 %   Platelets 78 (L) 150 - 400 K/uL   nRBC 0.0 0.0 - 0.2 %   Neutrophils Relative % 72 %   Neutro Abs 4.3 1.7 - 7.7 K/uL   Lymphocytes Relative 22 %   Lymphs Abs 1.3 0.7 - 4.0 K/uL   Monocytes Relative 5 %   Monocytes Absolute 0.3 0.1 - 1.0 K/uL   Eosinophils Relative 1 %   Eosinophils Absolute 0.1 0.0 - 0.5 K/uL   Basophils Relative 0 %   Basophils Absolute 0.0 0.0 - 0.1 K/uL   Immature Granulocytes 0 %   Abs Immature Granulocytes 0.02 0.00 - 0.07 K/uL    Comment: Performed at [facility], [address] [place],  [address]  CBC with Differential/Platelet     Status: Abnormal   Collection Time: 03/25/19 10:57 AM  Result Value Ref Range   WBC 7.0 4.0 - 10.5 K/uL   RBC 5.00 3.87 - 5.11 MIL/uL   Hemoglobin 14.3 12.0 -  15.0 g/dL   HCT 43.3 36.0 - 46.0 %   MCV 86.6 80.0 - 100.0 fL   MCH 28.6 26.0 - 34.0 pg   MCHC 33.0 30.0 - 36.0 g/dL   RDW 15.0 11.5 - 15.5 %   Platelets 96 (L) 150 - 400 K/uL   nRBC 0.0 0.0 - 0.2 %   Neutrophils Relative % 74 %   Neutro Abs 5.1 1.7 - 7.7 K/uL   Lymphocytes Relative 18 %   Lymphs Abs 1.2 0.7 - 4.0 K/uL   Monocytes Relative 4 %   Monocytes Absolute  0.3 0.1 - 1.0 K/uL   Eosinophils Relative 3 %   Eosinophils Absolute 0.2 0.0 - 0.5 K/uL   Basophils Relative 0 %   Basophils Absolute 0.0 0.0 - 0.1 K/uL   Immature Granulocytes 1 %   Abs Immature Granulocytes 0.04 0.00 - 0.07 K/uL    Comment: Performed at [facility], [address] [place], [place] [address]    -------------------------------------------------------------------------- A&P:  Problem List Items Addressed This Visit    Chronic maxillary sinusitis - Primary   Relevant Medications   amoxicillin-clavulanate (AUGMENTIN) 875-125 MG tablet   predniSONE (DELTASONE) 50 MG tablet     Suspected Acute on chronic recurrent Sinusitis - now concern with progression of symptoms similar to previous Sinusitis, has had worsening issue if prolonged infection, given her history opted to treat her earlier. - Last sinusitis w/ antibiotics >4 months ago - Reassuring without high risk symptoms - Afebrile, without dyspnea - has some immune deficiency  1. Augmentin BID x 10 days and Prednisone 50mg  daily x 5 day 2. Use other OTC sinus medicines as she is 3. F/u with hematology as planned 4. Future still reconsider ENT if able to get re-established  Meds ordered this encounter  Medications  . amoxicillin-clavulanate (AUGMENTIN) 875-125 MG tablet    Sig: Take 1 tablet by mouth 2 (two) times daily. For up to 10 days    Dispense:  20 tablet    Refill:  0  . predniSONE (DELTASONE) 50 MG tablet    Sig: Take 1 tablet (50 mg total) by mouth daily with breakfast.    Dispense:  5 tablet    Refill:  0     Follow-up: - Return in 1-2 weeks as needed sinusitis if not improved  Patient verbalizes understanding with the above medical recommendations including the limitation of remote medical advice.  Specific follow-up and call-back criteria were given for patient to follow-up or seek medical care more urgently if needed.   - Time spent in direct consultation with patient on phone: 8 minutes   [name], [facility] 03/28/2019, 11:31 AM

## 2019-03-29 NOTE — Progress Notes (Signed)
[facility]  [phone] [phone]  ID: Erica Bartlett OB: 1956/02/19  MR#: [id number]  GHW#:[id number]  Patient Care Team: Mikey College, NP (Inactive) as PCP - General (Nurse Practitioner) Chucky May, MD as Consulting Physician (Psychiatry) Lloyd Huger, MD as Consulting Physician (Oncology) Greg Cutter, LCSW as Social Worker (Licensed Clinical Social Worker)  I connected with Erica Bartlett on 03/29/19 at 10:00 AM EST by video enabled telemedicine visit and verified that I am speaking with the correct person using two identifiers.   I discussed the limitations, risks, security and privacy concerns of performing an evaluation and management service by telemedicine and the availability of in-person appointments. I also discussed with the patient that there may be a patient responsible charge related to this service. The patient expressed understanding and agreed to proceed.   Other persons participating in the visit and their role in the encounter: Patient, MD  Patient's location: Home Provider's location: Clinic  CHIEF COMPLAINT: ITP  INTERVAL HISTORY: Patient agreed to video enabled telemedicine visit as an add-on to discuss her laboratory work.  She noticed increased bruising recently, but this is since resolved.  She otherwise feels well and is asymptomatic.  She does not complain of weakness or fatigue today.  She has no neurologic complaints. She continues to have chronic back pain.  She has no neurologic complaints.  She denies any chest pain, shortness of breath, cough, or hemoptysis.  She has no nausea, vomiting, constipation, or diarrhea. She has no urinary complaints.  Patient offers no further specific complaints today.  REVIEW OF SYSTEMS:    Review of Systems  Constitutional: Negative for fever, malaise/fatigue and weight loss.  HENT: Negative.  Negative for congestion.   Respiratory: Negative.   Negative for cough and shortness of breath.   Cardiovascular: Negative.  Negative for chest pain and leg swelling.  Gastrointestinal: Negative.  Negative for blood in stool, melena and nausea.  Genitourinary: Negative.  Negative for hematuria.  Musculoskeletal: Positive for back pain. Negative for joint pain.  Skin: Negative.  Negative for rash.  Neurological: Negative.  Negative for sensory change, focal weakness, weakness and headaches.  Endo/Heme/Allergies: Does not bruise/bleed easily.  Psychiatric/Behavioral: Negative.  Negative for depression. The patient is not nervous/anxious and does not have insomnia.     As per HPI. Otherwise, a complete review of systems is negative.  PAST MEDICAL HISTORY: Past Medical History:  Diagnosis Date  . Hypertension     PAST SURGICAL HISTORY: Past Surgical History:  Procedure Laterality Date  . ABDOMINAL HYSTERECTOMY    . BREAST BIOPSY Left    age 57 surgical bx neg  . CESAREAN SECTION    . cyst removed     breast, benign    FAMILY HISTORY: Reviewed and unchanged. No reported history of malignancy or chronic disease.     ADVANCED DIRECTIVES:    HEALTH MAINTENANCE: Social History   Tobacco Use  . Smoking status: Current Some Day Smoker    Packs/day: 0.15    Types: Cigarettes  . Smokeless tobacco: Never Used  . Tobacco comment: 1 pack takes 3.5-4 weeks to finish   Substance Use Topics  . Alcohol use: No  . Drug use: No    Allergies  Allergen Reactions  . Doxycycline Nausea And Vomiting  . Penicillins Hives    Current Outpatient Medications  Medication Sig Dispense Refill  . acetaminophen (TYLENOL) 500 MG tablet Take 500 mg by mouth every 6 (six) hours as  needed.    Mar[place] Kitchen albuterol (PROVENTIL HFA;VENTOLIN HFA) 108 (90 Base) MCG/ACT inhaler Inhale 1-2 puffs into the lungs every 6 (six) hours as needed for wheezing or shortness of breath. 1 Inhaler 11  . amLODipine (NORVASC) 10 MG tablet Take 1 tablet (10 mg total) by mouth  daily. **NEED APPT** 30 tablet 0  . cetirizine (ZYRTEC) 10 MG tablet Take 1 tablet (10 mg total) by mouth daily. 30 tablet 11  . citalopram (CELEXA) 20 MG tablet Take 20 mg by mouth as needed.     . clonazePAM (KLONOPIN) 1 MG tablet Take 1-2 mg by mouth 3 (three) times daily as needed. Reported on 10/11/2015    . diphenhydrAMINE (BENADRYL) 25 mg capsule Take 25 mg by mouth as needed.    . fluticasone (FLONASE) 50 MCG/ACT nasal spray Place 2 sprays into both nostrils daily. 16 g 11  . hydrochlorothiazide (HYDRODIURIL) 25 MG tablet TAKE 1 TABLET BY MOUTH EVERY DAY 90 tablet 0  . ondansetron (ZOFRAN) 4 MG tablet Take 1 tablet (4 mg total) by mouth every 8 (eight) hours as needed for nausea or vomiting. 20 tablet 1  . amoxicillin-clavulanate (AUGMENTIN) 875-125 MG tablet Take 1 tablet by mouth 2 (two) times daily. For up to 10 days 20 tablet 0  . predniSONE (DELTASONE) 50 MG tablet Take 1 tablet (50 mg total) by mouth daily with breakfast. 5 tablet 0   No current facility-administered medications for this visit.     OBJECTIVE: There were no vitals filed for this visit.   There is no height or weight on file to calculate BMI.    ECOG FS:0 - Asymptomatic  General: Well-developed, well-nourished, no acute distress. Eyes: Pink conjunctiva, anicteric sclera. HEENT: Normocephalic, moist mucous membranes. Lungs: Clear to auscultation bilaterally. Heart: Regular rate and rhythm. No rubs, murmurs, or gallops. Abdomen: Soft, nontender, nondistended. No organomegaly noted, normoactive bowel sounds. Musculoskeletal: No edema, cyanosis, or clubbing. Neuro: Alert, answering all questions appropriately. Cranial nerves grossly intact. Skin: No rashes or petechiae noted. Psych: Normal affect.  LAB RESULTS:  Lab Results  Component Value Date   NA 138 08/17/2016   K 3.6 08/17/2016   CL 101 08/17/2016   CO2 25 08/17/2016   GLUCOSE 81 08/17/2016   BUN 11 08/17/2016   CREATININE 0.88 08/17/2016   CALCIUM  9.4 08/17/2016   PROT 7.3 08/17/2016   ALBUMIN 4.0 08/17/2016   AST 25 08/17/2016   ALT 30 (H) 08/17/2016   ALKPHOS 85 08/17/2016   BILITOT 0.3 08/17/2016   GFRNONAA 64 08/04/2015   GFRAA 74 08/04/2015    Lab Results  Component Value Date   WBC 7.0 03/25/2019   NEUTROABS 5.1 03/25/2019   HGB 14.3 03/25/2019   HCT 43.3 03/25/2019   MCV 86.6 03/25/2019   PLT 96 (L) 03/25/2019     STUDIES: No results found.  ASSESSMENT: Bone marrow biopsy proven ITP.  PLAN:    1. ITP: Bone marrow biopsy performed on July 28, 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 Patient responded to steroids, but the results were not durable. She completed her first round of weekly Rituxan on Aug 30, 2015, her second treatment completed on November 23, 2016, and her third round of weekly Rituxan x4 completed on December 20, 2017.  If patient requires additional treatment in the future, she will need increased premedications since she had a mild reaction with cycle 1 during her most recent treatment cycle.  Patient's platelet count is decreased, but  improved since her previous laboratory work at 57.  No intervention is needed at this time.  Return to clinic in 3 months with repeat laboratory work and video assisted telemedicine visit.   2.  Fatigue: Resolved. 3.  Back pain: Chronic and unchanged. Continue evaluation and treatment per primary care. 4.  Recurrent congestion/cough: Resolved.  Patient has a normal IgG level of 800.  I provided 15 minutes of face-to-face video visit time during this encounter, and > 50% was spent counseling as documented under my assessment & plan.   Patient expressed understanding and was in agreement with this plan. She also understands that She can call clinic at any time with any questions, concerns, or complaints.   Lloyd Huger, MD   03/29/2019 7:41 AM

## 2019-04-02 ENCOUNTER — Telehealth: Payer: Self-pay | Admitting: Nurse Practitioner
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
## 2019-04-03 ENCOUNTER — Telehealth: Payer: Medicare HMO

## 2019-04-04 ENCOUNTER — Other Ambulatory Visit: Payer: Medicare HMO

## 2019-04-04 ENCOUNTER — Ambulatory Visit: Payer: Medicare HMO | Admitting: Oncology

## 2019-04-04 NOTE — Telephone Encounter (Signed)
    Called pt regarding Community Resource Referral for follow up about mortage statement, she said she texted and it did not come through daughter will be emailing that over to me today.  [place] . Embedded Care Coordination [place]  Care Management ??[email or web address]  ??[phone]
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
## 2019-04-10 DIAGNOSIS — F332 Major depressive disorder, recurrent severe without psychotic features: Secondary | ICD-10-CM | POA: Diagnosis not present

## 2019-04-10 DIAGNOSIS — R69 Illness, unspecified: Secondary | ICD-10-CM | POA: Diagnosis not present

## 2019-04-15 NOTE — Telephone Encounter (Signed)
Application to [facility]  From: Jill Alexanders [facility])  Sent: Tuesday, April 15, 2019 5:09 PM To: Burnett [email or web address]> Subject: C3 Application - S. Currey - 04/15/19  Good Afternoon Cindy,  Please see attached application for Erica Bartlett Thank you and let me know if you have any questions, I will be out of the office until 12/28 I hope you have a very Merry Christmas!   [place] . Embedded Care Coordination [place]  Care Management ??[email or web address]  ??[phone]

## 2019-04-21 NOTE — Telephone Encounter (Signed)
12/28 left message with pt to let her know about the window measurements and to follow up with me if she'd like for me to pursue putting out feelers about getting a carpenter to come in and install with new frame or if she wants to wait on home insurance to get replacement window. knb -----Original Message----- From: Santiago [email or web address]>  Sent: Monday, April 21, 2019 12:49 PM To: Jill Alexanders [facility]) @[email or web address]> Subject: Re: Secure: Replacement window for Patient in [place]  *Caution - External email - see footer for warnings*  Hey. Closest thing I have for her right now is 35 X 65 1/2. So 3" too narrow and 4" short. Not sure if that will work.  [facility] Mobile: [phone] Fax: [phone]

## 2019-04-24 ENCOUNTER — Telehealth: Payer: Self-pay

## 2019-05-20 DIAGNOSIS — F332 Major depressive disorder, recurrent severe without psychotic features: Secondary | ICD-10-CM | POA: Diagnosis not present

## 2019-05-20 DIAGNOSIS — R69 Illness, unspecified: Secondary | ICD-10-CM | POA: Diagnosis not present

## 2019-05-21 ENCOUNTER — Ambulatory Visit (INDEPENDENT_AMBULATORY_CARE_PROVIDER_SITE_OTHER): Payer: Medicare HMO | Admitting: Family Medicine

## 2019-05-21 ENCOUNTER — Encounter: Payer: Self-pay | Admitting: Family Medicine

## 2019-05-21 ENCOUNTER — Other Ambulatory Visit: Payer: Self-pay

## 2019-05-21 DIAGNOSIS — B372 Candidiasis of skin and nail: Secondary | ICD-10-CM | POA: Diagnosis not present

## 2019-05-21 DIAGNOSIS — J320 Chronic maxillary sinusitis: Secondary | ICD-10-CM | POA: Diagnosis not present

## 2019-05-21 MED ORDER — CLOTRIMAZOLE 1 % EX CREA: 1.0000 "application " | TOPICAL_CREAM | Freq: Two times a day (BID) | CUTANEOUS | 0 refills | Status: DC

## 2019-05-21 MED ORDER — LEVOFLOXACIN 500 MG PO TABS: 500.0000 mg | ORAL_TABLET | Freq: Every day | ORAL | 0 refills | Status: DC

## 2019-05-21 MED ORDER — PREDNISONE 50 MG PO TABS: 50.0000 mg | ORAL_TABLET | Freq: Every day | ORAL | 0 refills | Status: DC

## 2019-05-21 NOTE — Patient Instructions (Addendum)
Switched antibiotic now to Levaquin 500 daily x 7 days Added back prednisone 50mg  daily for 5 days  Continue other therapy, if need can return to ENT if we cannot make progress  Use anti fungal cream on affected area twice a day for 2 weeks up to 4 weeks max if need, call if not improving can switch cream  Try to keep that area dry and avoid excessive moisture / sweating  Please schedule a Follow-up Appointment to: Return in about 2 weeks (around 06/04/2019), or if symptoms worsen or fail to improve, for sinus / fungal rash.  If you have any other questions or concerns, please feel free to call the office or send a message through [facility]. You may also schedule an earlier appointment if necessary.  Additionally, you may be receiving a survey about your experience at our office within a few days to 1 week by e-mail or mail. We value your feedback.  [name], DO [place]

## 2019-05-21 NOTE — Progress Notes (Signed)
Virtual Visit via Telephone The purpose of this virtual visit is to provide medical care while limiting exposure to the novel coronavirus (COVID19) for both patient and office staff.  Consent was obtained for phone visit:  Yes.   Answered questions that patient had about telehealth interaction:  Yes.   I discussed the limitations, risks, security and privacy concerns of performing an evaluation and management service by telephone. I also discussed with the patient that there may be a patient responsible charge related to this service. The patient expressed understanding and agreed to proceed.  Patient Location: Home Provider Location: [facility])  ---------------------------------------------------------------------- Chief Complaint  Patient presents with  . Sinus Problem    facial pressure mostly under eyes, headaches, nasal drainage. Intermittent x 2 mths. Pt recently had a COVID test that came back negative    . Rash    pt complains of a itchy rash at the top of the gluteal cleft  x 2 weeks.     S: Reviewed CMA documentation. I have called patient and gathered additional HPI as follows:  RecurrentSINUSITIS Last visit with me for same issue 03/2019,treated with  augmentin, prednisone was effective but only temporary relief then symptoms returned, she does have known history of chronic vs recurrent sinusitis. Previously followed by ENT. Had done better fewer sinus infection Describes worse facial pressure under eyes, headaches, nasal sinus drainage, seems episodic over 2 months now worsening again. No sick contacts or concern for COVID19. Recent COVID19 test negative - Tried OTCFlonase, Mucinex, robitussin  Gluteal Rash / Suspected Candidal Intertrigo Reported rash irritated and itching inflamed in upper gluteal cleft area within skin fold. She has not put anything on it so far, has used topical cream before, but none for this problem. Admits may have had some  inc moisture with it but no other new exposures or concerns. Denies any ulceration.   Denies any high risk travel to areas of current concern for COVID19. Denies any known or suspected exposure to person with or possibly with COVID19.  Denies any fevers, chills, sweats, body ache, cough, shortness of breath, abdominal pain, diarrhea  Past Medical History:  Diagnosis Date  . Hypertension    Social History   Tobacco Use  . Smoking status: Current Some Day Smoker    Packs/day: 0.15    Types: Cigarettes  . Smokeless tobacco: Never Used  . Tobacco comment: 1 pack takes 3.5-4 weeks to finish   Substance Use Topics  . Alcohol use: No  . Drug use: No    Current Outpatient Medications:  .  acetaminophen (TYLENOL) 500 MG tablet, Take 500 mg by mouth every 6 (six) hours as needed., Disp: , Rfl:  .  albuterol (PROVENTIL HFA;VENTOLIN HFA) 108 (90 Base) MCG/ACT inhaler, Inhale 1-2 puffs into the lungs every 6 (six) hours as needed for wheezing or shortness of breath., Disp: 1 Inhaler, Rfl: 11 .  amLODipine (NORVASC) 10 MG tablet, Take 1 tablet (10 mg total) by mouth daily. **NEED APPT**, Disp: 30 tablet, Rfl: 0 .  cetirizine (ZYRTEC) 10 MG tablet, Take 1 tablet (10 mg total) by mouth daily., Disp: 30 tablet, Rfl: 11 .  citalopram (CELEXA) 20 MG tablet, Take 20 mg by mouth as needed. , Disp: , Rfl:  .  clonazePAM (KLONOPIN) 1 MG tablet, Take 1-2 mg by mouth 3 (three) times daily as needed. Reported on 10/11/2015, Disp: , Rfl:  .  dextromethorphan 7.5 MG/5ML SYRP, Take 7.5 mg by mouth every 6 (six)  hours as needed., Disp: , Rfl:  .  diphenhydrAMINE (BENADRYL) 25 mg capsule, Take 25 mg by mouth as needed., Disp: , Rfl:  .  hydrochlorothiazide (HYDRODIURIL) 25 MG tablet, TAKE 1 TABLET BY MOUTH EVERY DAY, Disp: 90 tablet, Rfl: 0 .  ondansetron (ZOFRAN) 4 MG tablet, Take 1 tablet (4 mg total) by mouth every 8 (eight) hours as needed for nausea or vomiting., Disp: 20 tablet, Rfl: 1 .  clotrimazole  (LOTRIMIN) 1 % cream, Apply 1 application topically 2 (two) times daily. For 2-4 weeks, use on affected area, Disp: 30 g, Rfl: 0 .  fluticasone (FLONASE) 50 MCG/ACT nasal spray, Place 2 sprays into both nostrils daily. (Patient not taking: Reported on 05/21/2019), Disp: 16 g, Rfl: 11 .  levofloxacin (LEVAQUIN) 500 MG tablet, Take 1 tablet (500 mg total) by mouth daily. For 7 days, Disp: 7 tablet, Rfl: 0 .  predniSONE (DELTASONE) 50 MG tablet, Take 1 tablet (50 mg total) by mouth daily with breakfast., Disp: 5 tablet, Rfl: 0  Depression screen [facility] 2/9 03/28/2019 12/17/2018 10/15/2018  Decreased Interest 0 0 1  Down, Depressed, Hopeless 0 1 1  PHQ - 2 Score 0 1 2  Altered sleeping - - 1  Tired, decreased energy - - 1  Change in appetite - - 1  Feeling bad or failure about yourself  - - 0  Trouble concentrating - - 0  Moving slowly or fidgety/restless - - 0  Suicidal thoughts - - 0  PHQ-9 Score - - 5  Difficult doing work/chores - - Not difficult at all    GAD 7 : Generalized Anxiety Score 10/15/2018 08/26/2018  Nervous, Anxious, on Edge 1 1  Control/stop worrying 1 1  Worry too much - different things 1 1  Trouble relaxing 2 0  Restless 0 0  Easily annoyed or irritable 0 1  Afraid - awful might happen 1 1  Total GAD 7 Score 6 5  Anxiety Difficulty Not difficult at all Not difficult at all    -------------------------------------------------------------------------- O: No physical exam performed due to remote telephone encounter.  Lab results reviewed.  Recent Results (from the past 2160 hour(s))  CBC with Differential/Platelet     Status: Abnormal   Collection Time: 03/25/19 10:57 AM  Result Value Ref Range   WBC 7.0 4.0 - 10.5 K/uL   RBC 5.00 3.87 - 5.11 MIL/uL   Hemoglobin 14.3 12.0 - 15.0 g/dL   HCT 43.3 36.0 - 46.0 %   MCV 86.6 80.0 - 100.0 fL   MCH 28.6 26.0 - 34.0 pg   MCHC 33.0 30.0 - 36.0 g/dL   RDW 15.0 11.5 - 15.5 %   Platelets 96 (L) 150 - 400 K/uL   nRBC 0.0 0.0 -  0.2 %   Neutrophils Relative % 74 %   Neutro Abs 5.1 1.7 - 7.7 K/uL   Lymphocytes Relative 18 %   Lymphs Abs 1.2 0.7 - 4.0 K/uL   Monocytes Relative 4 %   Monocytes Absolute 0.3 0.1 - 1.0 K/uL   Eosinophils Relative 3 %   Eosinophils Absolute 0.2 0.0 - 0.5 K/uL   Basophils Relative 0 %   Basophils Absolute 0.0 0.0 - 0.1 K/uL   Immature Granulocytes 1 %   Abs Immature Granulocytes 0.04 0.00 - 0.07 K/uL    Comment: Performed at [facility], [address] [place], [place] [address]    -------------------------------------------------------------------------- A&P:  Problem List Items Addressed This Visit    Chronic maxillary  sinusitis - Primary   Relevant Medications   dextromethorphan 7.5 MG/5ML SYRP   levofloxacin (LEVAQUIN) 500 MG tablet   predniSONE (DELTASONE) 50 MG tablet  Suspected Subacute on chronic recurrentSinusitis - now concern with progression of symptoms similar to previous Sinusitis, has had worsening issue if prolonged infection. - Last sinusitis w/ antibiotics 6-8 weeks ago with Augmentin only partial improvement, has in past responded to repeat courses - Reassuring without high risk symptoms - Afebrile, without dyspnea - has some immune deficiency  1. Levaquin 500 x 7 days and repeat Prednisone 50mg  daily x 5 day 2. Future still reconsider ENT if able to get re-established    Other Visit Diagnoses    Candidal intertrigo       Relevant Medications   clotrimazole (LOTRIMIN) 1 % cream     Clinically consistent with localized likely fungal candidal rash within skin fold gluteal cleft Trial on topical rx Clotrimazole BID for 2-4 weeks, avoid excess moisture, work on keep dry and clean Follow-up sooner as needed if complication or not improving, consider switch to ketoconazole or other topical if indicated.    Meds ordered this encounter  Medications  . levofloxacin (LEVAQUIN) 500 MG tablet    Sig: Take 1 tablet (500 mg total) by mouth daily. For  7 days    Dispense:  7 tablet    Refill:  0  . predniSONE (DELTASONE) 50 MG tablet    Sig: Take 1 tablet (50 mg total) by mouth daily with breakfast.    Dispense:  5 tablet    Refill:  0  . clotrimazole (LOTRIMIN) 1 % cream    Sig: Apply 1 application topically 2 (two) times daily. For 2-4 weeks, use on affected area    Dispense:  30 g    Refill:  0    Follow-up: - Return in 1-2 weeks as needed if not improved  Patient verbalizes understanding with the above medical recommendations including the limitation of remote medical advice.  Specific follow-up and call-back criteria were given for patient to follow-up or seek medical care more urgently if needed.   - Time spent in direct consultation with patient on phone: 7 minutes   [name], [facility] 05/21/2019, 4:04 PM

## 2019-05-29 ENCOUNTER — Telehealth: Payer: Self-pay

## 2019-05-29 ENCOUNTER — Ambulatory Visit: Payer: Self-pay | Admitting: Licensed Clinical Social Worker

## 2019-05-29 NOTE — Chronic Care Management (AMB) (Signed)
  Care Management   Follow Up Note   05/29/2019 Name: Erica Bartlett MRN: [id number] DOB: 10-12-1955  Referred by: Mikey College, NP (Inactive) Reason for referral : Heard is a 64 y.o. year old female who is a primary care patient of Mikey College, NP (Inactive). The care management team was consulted for assistance with care management and care coordination needs.    Review of patient status, including review of consultants reports, relevant laboratory and other test results, and collaboration with appropriate care team members and the patient's provider was performed as part of comprehensive patient evaluation and provision of chronic care management services.    LCSW completed initial outreach attempt and a female answered but did not wish to talk. LCSW is unsure if this was the patient or a friend/family but phone call disconnected. LCSW will update C3 Guide and will ensure that financial support was provided.    Eula Fried, BSW, MSW, [email or web address] Phone: [phone]

## 2019-06-09 DIAGNOSIS — Z23 Encounter for immunization: Secondary | ICD-10-CM | POA: Diagnosis not present

## 2019-06-17 ENCOUNTER — Ambulatory Visit (INDEPENDENT_AMBULATORY_CARE_PROVIDER_SITE_OTHER): Payer: Medicare HMO | Admitting: Family Medicine

## 2019-06-17 ENCOUNTER — Other Ambulatory Visit: Payer: Self-pay

## 2019-06-17 ENCOUNTER — Encounter: Payer: Self-pay | Admitting: Family Medicine

## 2019-06-17 VITALS — BP 117/71 | HR 74 | Temp 97.5°F | Resp 16 | Ht 63.0 in | Wt 169.8 lb

## 2019-06-17 DIAGNOSIS — L600 Ingrowing nail: Secondary | ICD-10-CM

## 2019-06-17 DIAGNOSIS — L03032 Cellulitis of left toe: Secondary | ICD-10-CM

## 2019-06-17 MED ORDER — TRIAMCINOLONE ACETONIDE 0.5 % EX CREA: 1.0000 "application " | TOPICAL_CREAM | Freq: Two times a day (BID) | CUTANEOUS | 1 refills | Status: DC

## 2019-06-17 MED ORDER — MUPIROCIN 2 % EX OINT: 1.0000 "application " | TOPICAL_OINTMENT | Freq: Two times a day (BID) | CUTANEOUS | 1 refills | Status: DC

## 2019-06-17 NOTE — Patient Instructions (Addendum)
Thank you for coming to the office today.  Stay tuned for apt. Sent referral today. You last saw Dr Celesta Gentile back in 2017 for toenail fungus.  [place] Address: [address] [place], [place] [address] Hours: Open 8AM-5PM Phone: [phone]  You have an Ingrown Toenail that has caused an Infection of the nailbed.  Use topical triamcinolone 0.5% cream twice a day FIRST to absorb into skin 15 - 30 min later you can use topical antibiotic ointment Mupirocin twice a day  - To help it drain pus and heal, recommend warm water soaks for 10-15 min at a time several times a day initially   You may need partial toenail removal as discussed. Occasionally the toenail will return to being ingrown as it grows back, important to help prevent.  If you get worsening pain, swelling or redness that spreads down toe into foot, or fevers / nausea, vomiting, unable to keep pills down, then may need to follow-up in the office or if severe worsening, may need to go to the Emergency Department for spreading skin infection.   Please schedule a Follow-up Appointment to: Return in about 4 weeks (around 07/15/2019), or if symptoms worsen or fail to improve, for ingrown toenail.  If you have any other questions or concerns, please feel free to call the office or send a message through [place]. You may also schedule an earlier appointment if necessary.  Additionally, you may be receiving a survey about your experience at our office within a few days to 1 week by e-mail or mail. We value your feedback.  [name], DO [place]

## 2019-06-17 NOTE — Progress Notes (Signed)
Subjective:    Patient ID: Erica Bartlett, female    DOB: 04-18-1956, 64 y.o.   MRN: [id number]  Erica Bartlett is a 64 y.o. female presenting on 06/17/2019 for Leg Pain (Left leg pain pt says maybe from ingrown toenail but is getting worse. Pain is from toe up to hip x1 week)   HPI  Left Toenail pain (Left foot, 2nd toe) Reports today new issue x 1 week with left toenail 2nd toe pain on outer edge, seems a little swollen, sore and painful with weight bearing ambulation, worse if on feet longer, also worse if touched even light touch by bedsheet or pressure from shoe. She has history of toenail fungus and ingrown toenail before. She used to see Dr Celesta Gentile back in 2017 [facility]. She has tried topical cortisone OTC no relief, tried warm water soak only temporary relief now. - Denies fever chills sweats, redness drainage of pus, bleeding, injury laceration, numbness tingling  Additional update Admits increasing bruising various locations of body. She states that she will return to Oncology to follow-up this issue.  Depression screen [facility] 2/9 06/17/2019 03/28/2019 12/17/2018  Decreased Interest 0 0 0  Down, Depressed, Hopeless 0 0 1  PHQ - 2 Score 0 0 1  Altered sleeping - - -  Tired, decreased energy - - -  Change in appetite - - -  Feeling bad or failure about yourself  - - -  Trouble concentrating - - -  Moving slowly or fidgety/restless - - -  Suicidal thoughts - - -  PHQ-9 Score - - -  Difficult doing work/chores - - -    Social History   Tobacco Use  . Smoking status: Current Some Day Smoker    Packs/day: 0.15    Types: Cigarettes  . Smokeless tobacco: Never Used  . Tobacco comment: 1 pack takes 3.5-4 weeks to finish   Substance Use Topics  . Alcohol use: No  . Drug use: No    Review of Systems Per HPI unless specifically indicated above     Objective:    BP 117/71   Pulse 74   Temp (!) 97.5 F (36.4 C) (Temporal)   Resp 16   Ht 5\' 3"  (1.6  m)   Wt 169 lb 12.8 oz (77 kg)   SpO2 100%   BMI 30.08 kg/m   Wt Readings from Last 3 Encounters:  06/17/19 169 lb 12.8 oz (77 kg)  12/25/18 165 lb 6.4 oz (75 kg)  12/17/18 174 lb (78.9 kg)    Physical Exam Vitals and nursing note reviewed.  Constitutional:      General: She is not in acute distress.    Appearance: She is well-developed. She is not diaphoretic.     Comments: Well-appearing, comfortable, cooperative  HENT:     Head: Normocephalic and atraumatic.  Eyes:     General:        Right eye: No discharge.        Left eye: No discharge.     Conjunctiva/sclera: Conjunctivae normal.  Cardiovascular:     Rate and Rhythm: Normal rate.  Pulmonary:     Effort: Pulmonary effort is normal.  Musculoskeletal:     Comments: Left foot 2nd toe, appears to have lateral aspect slight swelling with ingrown appearing toenail, possible skin change at location of ingrown appears to be developing paronychia but has callus formation as well, very sensitive to light touch, no drainage, no ulceration, no erythema.  Skin:  General: Skin is warm and dry.     Findings: Bruising (various locations) present. No erythema or rash.  Neurological:     Mental Status: She is alert and oriented to person, place, and time.  Psychiatric:        Behavior: Behavior normal.     Comments: Well groomed, good eye contact, normal speech and thoughts    Results for orders placed or performed in visit on 03/25/19  CBC with Differential/Platelet  Result Value Ref Range   WBC 7.0 4.0 - 10.5 K/uL   RBC 5.00 3.87 - 5.11 MIL/uL   Hemoglobin 14.3 12.0 - 15.0 g/dL   HCT 43.3 36.0 - 46.0 %   MCV 86.6 80.0 - 100.0 fL   MCH 28.6 26.0 - 34.0 pg   MCHC 33.0 30.0 - 36.0 g/dL   RDW 15.0 11.5 - 15.5 %   Platelets 96 (L) 150 - 400 K/uL   nRBC 0.0 0.0 - 0.2 %   Neutrophils Relative % 74 %   Neutro Abs 5.1 1.7 - 7.7 K/uL   Lymphocytes Relative 18 %   Lymphs Abs 1.2 0.7 - 4.0 K/uL   Monocytes Relative 4 %    Monocytes Absolute 0.3 0.1 - 1.0 K/uL   Eosinophils Relative 3 %   Eosinophils Absolute 0.2 0.0 - 0.5 K/uL   Basophils Relative 0 %   Basophils Absolute 0.0 0.0 - 0.1 K/uL   Immature Granulocytes 1 %   Abs Immature Granulocytes 0.04 0.00 - 0.07 K/uL      Assessment & Plan:   Problem List Items Addressed This Visit    None    Visit Diagnoses    Paronychia of toe of left foot due to ingrown toenail    -  Primary   Relevant Medications   mupirocin ointment (BACTROBAN) 2 %   triamcinolone cream (KENALOG) 0.5 %   Other Relevant Orders   Ambulatory referral to Podiatry      Ingrown Left 2nd toenail lateral aspect Suspicious for acute developing paronychia based on exam Questioned gout but no history, given sensitivity and pain, but no other physical sign of gout Afebrile without systemic symptoms or extending cellulitis. Neurovascular intact. No prior toenail removal.   Plan: 1. Referral back to [facility] Podiatry 2. Recommend warm water soaks 3. Start topical regimen - Triamcinolone 0.5% cream BID absorb, then 15-30 min use Mupirocin antibiotic ointment BID Return criteria given. Follow-up PRN  Orders Placed This Encounter  Procedures  . Ambulatory referral to Podiatry    Referral Priority:   Routine    Referral Type:   Consultation    Referral Reason:   Specialty Services Required    Requested Specialty:   Podiatry    Number of Visits Requested:   1     Meds ordered this encounter  Medications  . DISCONTD: mupirocin ointment (BACTROBAN) 2 %    Sig: Apply 1 application topically 2 (two) times daily. Apply 15-45min after steroid cream has rubbed in. For up to 2 weeks    Dispense:  30 g    Refill:  1  . DISCONTD: triamcinolone cream (KENALOG) 0.5 %    Sig: Apply 1 application topically 2 (two) times daily. To affected areas, for up to 2 weeks.    Dispense:  30 g    Refill:  1  . mupirocin ointment (BACTROBAN) 2 %    Sig: Apply 1 application topically 2 (two) times daily.  Apply 15-53min after steroid cream has rubbed in. For  up to 2 weeks    Dispense:  30 g    Refill:  1    Initially sent rx e-script to [facility], but patient requested change of location to [facility]  . triamcinolone cream (KENALOG) 0.5 %    Sig: Apply 1 application topically 2 (two) times daily. To affected areas, for up to 2 weeks.    Dispense:  30 g    Refill:  1    Initially sent rx e-script to [facility], but patient requested change of location to [facility]    *Note sent rx to [facility] then patient changed request to [facility]*   Follow up plan: Return in about 4 weeks (around 07/15/2019), or if symptoms worsen or fail to improve, for ingrown toenail.    [name], DO [facility] 06/17/2019, 1:47 PM

## 2019-06-24 DIAGNOSIS — F332 Major depressive disorder, recurrent severe without psychotic features: Secondary | ICD-10-CM | POA: Diagnosis not present

## 2019-06-24 DIAGNOSIS — R69 Illness, unspecified: Secondary | ICD-10-CM | POA: Diagnosis not present

## 2019-06-27 ENCOUNTER — Other Ambulatory Visit: Payer: Self-pay | Admitting: Family Medicine

## 2019-06-27 ENCOUNTER — Other Ambulatory Visit: Payer: Self-pay | Admitting: Nurse Practitioner

## 2019-06-27 ENCOUNTER — Telehealth: Payer: Self-pay | Admitting: Nurse Practitioner

## 2019-06-27 DIAGNOSIS — I10 Essential (primary) hypertension: Secondary | ICD-10-CM

## 2019-06-27 DIAGNOSIS — Z20828 Contact with and (suspected) exposure to other viral communicable diseases: Secondary | ICD-10-CM

## 2019-06-27 DIAGNOSIS — J40 Bronchitis, not specified as acute or chronic: Secondary | ICD-10-CM

## 2019-06-27 DIAGNOSIS — B372 Candidiasis of skin and nail: Secondary | ICD-10-CM

## 2019-06-27 MED ORDER — HYDROCHLOROTHIAZIDE 25 MG PO TABS: 25.0000 mg | ORAL_TABLET | Freq: Every day | ORAL | 0 refills | Status: DC

## 2019-06-27 NOTE — Telephone Encounter (Signed)
Pt called requesting refill on hydrochlorothiazide 25 MG,amlodipine 10 MG. [place]

## 2019-06-29 NOTE — Progress Notes (Deleted)
[place]  Telephone:(336) [phone] Fax:(336) [phone]  ID: Gloriann Riede Elmes OB: 14-Feb-1956  MR#: [id number]  TDS#:[id number]  Patient Care Team: Mikey College, NP (Inactive) as PCP - General (Nurse Practitioner) Chucky May, MD as Consulting Physician (Psychiatry) Lloyd Huger, MD as Consulting Physician (Oncology) Greg Cutter, LCSW as Social Worker (Licensed Clinical Social Worker)  I connected with Marion Seese Ramthun on 06/29/19 at 11:00 AM EST by {Blank single:19197::"video enabled telemedicine visit","telephone visit"} and verified that I am speaking with the correct person using two identifiers.   I discussed the limitations, risks, security and privacy concerns of performing an evaluation and management service by telemedicine and the availability of in-person appointments. I also discussed with the patient that there may be a patient responsible charge related to this service. The patient expressed understanding and agreed to proceed.   Other persons participating in the visit and their role in the encounter: Patient, MD.  Patient's location: Home. Provider's location: Clinic.  CHIEF COMPLAINT: ITP  INTERVAL HISTORY: Patient agreed to video enabled telemedicine visit as an add-on to discuss her laboratory work.  She noticed increased bruising recently, but this is since resolved.  She otherwise feels well and is asymptomatic.  She does not complain of weakness or fatigue today.  She has no neurologic complaints. She continues to have chronic back pain.  She has no neurologic complaints.  She denies any chest pain, shortness of breath, cough, or hemoptysis.  She has no nausea, vomiting, constipation, or diarrhea. She has no urinary complaints.  Patient offers no further specific complaints today.  REVIEW OF SYSTEMS:    Review of Systems  Constitutional: Negative for fever, malaise/fatigue and weight loss.  HENT: Negative.  Negative for  congestion.   Respiratory: Negative.  Negative for cough and shortness of breath.   Cardiovascular: Negative.  Negative for chest pain and leg swelling.  Gastrointestinal: Negative.  Negative for blood in stool, melena and nausea.  Genitourinary: Negative.  Negative for hematuria.  Musculoskeletal: Positive for back pain. Negative for joint pain.  Skin: Negative.  Negative for rash.  Neurological: Negative.  Negative for sensory change, focal weakness, weakness and headaches.  Endo/Heme/Allergies: Does not bruise/bleed easily.  Psychiatric/Behavioral: Negative.  Negative for depression. The patient is not nervous/anxious and does not have insomnia.     As per HPI. Otherwise, a complete review of systems is negative.  PAST MEDICAL HISTORY: Past Medical History:  Diagnosis Date  . Hypertension     PAST SURGICAL HISTORY: Past Surgical History:  Procedure Laterality Date  . ABDOMINAL HYSTERECTOMY    . BREAST BIOPSY Left    age 68 surgical bx neg  . CESAREAN SECTION    . cyst removed     breast, benign    FAMILY HISTORY: Reviewed and unchanged. No reported history of malignancy or chronic disease.     ADVANCED DIRECTIVES:    HEALTH MAINTENANCE: Social History   Tobacco Use  . Smoking status: Current Some Day Smoker    Packs/day: 0.15    Types: Cigarettes  . Smokeless tobacco: Never Used  . Tobacco comment: 1 pack takes 3.5-4 weeks to finish   Substance Use Topics  . Alcohol use: No  . Drug use: No    Allergies  Allergen Reactions  . Doxycycline Nausea And Vomiting  . Penicillins Hives    Current Outpatient Medications  Medication Sig Dispense Refill  . acetaminophen (TYLENOL) 500 MG tablet Take 500 mg by mouth every 6 (six)  hours as needed.    Marland Kitchen amLODipine (NORVASC) 10 MG tablet TAKE 1 TABLET (10 MG TOTAL) BY MOUTH DAILY. **NEED APPT** 30 tablet 0  . cetirizine (ZYRTEC) 10 MG tablet Take 1 tablet (10 mg total) by mouth daily. 30 tablet 11  . citalopram  (CELEXA) 20 MG tablet Take 20 mg by mouth as needed.     . clonazePAM (KLONOPIN) 1 MG tablet Take 1-2 mg by mouth 3 (three) times daily as needed. Reported on 10/11/2015    . clotrimazole (LOTRIMIN) 1 % cream Apply 1 application topically 2 (two) times daily. For 2-4 weeks, use on affected area (Patient not taking: Reported on 06/17/2019) 30 g 0  . dextromethorphan 7.5 MG/5ML SYRP Take 7.5 mg by mouth every 6 (six) hours as needed.    . diphenhydrAMINE (BENADRYL) 25 mg capsule Take 25 mg by mouth as needed.    . fluticasone (FLONASE) 50 MCG/ACT nasal spray Place 2 sprays into both nostrils daily. 16 g 11  . hydrochlorothiazide (HYDRODIURIL) 25 MG tablet Take 1 tablet (25 mg total) by mouth daily. 30 tablet 0  . levofloxacin (LEVAQUIN) 500 MG tablet Take 1 tablet (500 mg total) by mouth daily. For 7 days (Patient not taking: Reported on 06/17/2019) 7 tablet 0  . mupirocin ointment (BACTROBAN) 2 % Apply 1 application topically 2 (two) times daily. Apply 15-45mn after steroid cream has rubbed in. For up to 2 weeks 30 g 1  . ondansetron (ZOFRAN) 4 MG tablet Take 1 tablet (4 mg total) by mouth every 8 (eight) hours as needed for nausea or vomiting. 20 tablet 1  . predniSONE (DELTASONE) 50 MG tablet Take 1 tablet (50 mg total) by mouth daily with breakfast. (Patient not taking: Reported on 06/17/2019) 5 tablet 0  . triamcinolone cream (KENALOG) 0.5 % Apply 1 application topically 2 (two) times daily. To affected areas, for up to 2 weeks. 30 g 1  . VENTOLIN HFA 108 (90 Base) MCG/ACT inhaler INHALE 1-2 PUFFS INTO THE LUNGS EVERY 6 (SIX) HOURS AS NEEDED FOR WHEEZING OR SHORTNESS OF BREATH. 18 g 0   No current facility-administered medications for this visit.    OBJECTIVE: There were no vitals filed for this visit.   There is no height or weight on file to calculate BMI.    ECOG FS:0 - Asymptomatic  General: Well-developed, well-nourished, no acute distress. Eyes: Pink conjunctiva, anicteric sclera. HEENT:  Normocephalic, moist mucous membranes. Lungs: Clear to auscultation bilaterally. Heart: Regular rate and rhythm. No rubs, murmurs, or gallops. Abdomen: Soft, nontender, nondistended. No organomegaly noted, normoactive bowel sounds. Musculoskeletal: No edema, cyanosis, or clubbing. Neuro: Alert, answering all questions appropriately. Cranial nerves grossly intact. Skin: No rashes or petechiae noted. Psych: Normal affect.  LAB RESULTS:  Lab Results  Component Value Date   NA 138 08/17/2016   K 3.6 08/17/2016   CL 101 08/17/2016   CO2 25 08/17/2016   GLUCOSE 81 08/17/2016   BUN 11 08/17/2016   CREATININE 0.88 08/17/2016   CALCIUM 9.4 08/17/2016   PROT 7.3 08/17/2016   ALBUMIN 4.0 08/17/2016   AST 25 08/17/2016   ALT 30 (H) 08/17/2016   ALKPHOS 85 08/17/2016   BILITOT 0.3 08/17/2016   GFRNONAA 64 08/04/2015   GFRAA 74 08/04/2015    Lab Results  Component Value Date   WBC 7.0 03/25/2019   NEUTROABS 5.1 03/25/2019   HGB 14.3 03/25/2019   HCT 43.3 03/25/2019   MCV 86.6 03/25/2019   PLT 96 (L) 03/25/2019  STUDIES: No results found.  ASSESSMENT: Bone marrow biopsy proven ITP.  PLAN:    1. ITP: Bone marrow biopsy performed on July 28, 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 Patient responded to steroids, but the results were not durable. She completed her first round of weekly Rituxan on Aug 30, 2015, her second treatment completed on November 23, 2016, and her third round of weekly Rituxan x4 completed on December 20, 2017.  If patient requires additional treatment in the future, she will need increased premedications since she had a mild reaction with cycle 1 during her most recent treatment cycle.  Patient's platelet count is decreased, but improved since her previous laboratory work at 81.  No intervention is needed at this time.  Return to clinic in 3 months with repeat laboratory work and video assisted telemedicine visit.   2.  Fatigue:  Resolved. 3.  Back pain: Chronic and unchanged. Continue evaluation and treatment per primary care. 4.  Recurrent congestion/cough: Resolved.  Patient has a normal IgG level of 800.  I provided *** minutes of {Blank single:19197::"face-to-face video visit time","non face-to-face telephone visit time"} during this encounter which included chart review, counseling, and coordination of care as documented above.   Patient expressed understanding and was in agreement with this plan. She also understands that She can call clinic at any time with any questions, concerns, or complaints.   Lloyd Huger, MD   06/29/2019 6:17 PM

## 2019-06-30 ENCOUNTER — Encounter: Payer: Self-pay | Admitting: Oncology

## 2019-06-30 ENCOUNTER — Other Ambulatory Visit: Payer: Self-pay

## 2019-06-30 ENCOUNTER — Other Ambulatory Visit: Payer: Self-pay | Admitting: Oncology

## 2019-06-30 ENCOUNTER — Telehealth: Payer: Self-pay | Admitting: Emergency Medicine

## 2019-06-30 ENCOUNTER — Inpatient Hospital Stay: Payer: Medicare HMO | Attending: Oncology

## 2019-06-30 DIAGNOSIS — F17210 Nicotine dependence, cigarettes, uncomplicated: Secondary | ICD-10-CM | POA: Insufficient documentation

## 2019-06-30 DIAGNOSIS — M549 Dorsalgia, unspecified: Secondary | ICD-10-CM | POA: Diagnosis not present

## 2019-06-30 DIAGNOSIS — I10 Essential (primary) hypertension: Secondary | ICD-10-CM | POA: Insufficient documentation

## 2019-06-30 DIAGNOSIS — R69 Illness, unspecified: Secondary | ICD-10-CM | POA: Diagnosis not present

## 2019-06-30 DIAGNOSIS — D693 Immune thrombocytopenic purpura: Secondary | ICD-10-CM

## 2019-06-30 DIAGNOSIS — Z79899 Other long term (current) drug therapy: Secondary | ICD-10-CM | POA: Diagnosis not present

## 2019-06-30 DIAGNOSIS — Z5111 Encounter for antineoplastic chemotherapy: Secondary | ICD-10-CM | POA: Insufficient documentation

## 2019-06-30 DIAGNOSIS — G8929 Other chronic pain: Secondary | ICD-10-CM | POA: Diagnosis not present

## 2019-06-30 LAB — CBC WITH DIFFERENTIAL/PLATELET
Abs Immature Granulocytes: 0.04 10*3/uL (ref 0.00–0.07)
Basophils Absolute: 0 10*3/uL (ref 0.0–0.1)
Basophils Relative: 0 %
Eosinophils Absolute: 0.1 10*3/uL (ref 0.0–0.5)
Eosinophils Relative: 1 %
HCT: 43.4 % (ref 36.0–46.0)
Hemoglobin: 14.4 g/dL (ref 12.0–15.0)
Immature Granulocytes: 0 %
Lymphocytes Relative: 25 %
Lymphs Abs: 2.4 10*3/uL (ref 0.7–4.0)
MCH: 28.7 pg (ref 26.0–34.0)
MCHC: 33.2 g/dL (ref 30.0–36.0)
MCV: 86.6 fL (ref 80.0–100.0)
Monocytes Absolute: 0.4 10*3/uL (ref 0.1–1.0)
Monocytes Relative: 4 %
Neutro Abs: 6.5 10*3/uL (ref 1.7–7.7)
Neutrophils Relative %: 70 %
Platelets: 9 10*3/uL — CL (ref 150–400)
RBC: 5.01 MIL/uL (ref 3.87–5.11)
RDW: 14.1 % (ref 11.5–15.5)
WBC: 9.4 10*3/uL (ref 4.0–10.5)
nRBC: 0 % (ref 0.0–0.2)

## 2019-06-30 MED ORDER — PREDNISONE 50 MG PO TABS: ORAL_TABLET | ORAL | 0 refills | Status: DC

## 2019-06-30 NOTE — Telephone Encounter (Signed)
Called patient to let her know that we had sent a prescription to her pharmacy for prednisone and scheduled her to see Dr. Grayland Ormond tomorrow morning at 8:30. Pt verbalized understanding and didn't have any further questions or concerns.

## 2019-06-30 NOTE — Progress Notes (Signed)
Patient prescreened for appointment. Patient has no concerns or questions.  

## 2019-07-01 ENCOUNTER — Inpatient Hospital Stay: Payer: Medicare HMO | Admitting: Oncology

## 2019-07-01 ENCOUNTER — Encounter: Payer: Medicare HMO | Admitting: Oncology

## 2019-07-01 ENCOUNTER — Other Ambulatory Visit: Payer: Medicare HMO

## 2019-07-01 ENCOUNTER — Ambulatory Visit: Payer: Medicare HMO

## 2019-07-02 ENCOUNTER — Ambulatory Visit: Payer: Medicare HMO

## 2019-07-02 ENCOUNTER — Other Ambulatory Visit: Payer: Self-pay

## 2019-07-02 ENCOUNTER — Inpatient Hospital Stay: Payer: Medicare HMO

## 2019-07-02 ENCOUNTER — Inpatient Hospital Stay (HOSPITAL_BASED_OUTPATIENT_CLINIC_OR_DEPARTMENT_OTHER): Payer: Medicare HMO | Admitting: Oncology

## 2019-07-02 ENCOUNTER — Inpatient Hospital Stay: Payer: Medicare HMO | Admitting: Oncology

## 2019-07-02 VITALS — BP 107/57 | HR 74 | Temp 97.0°F | Resp 18

## 2019-07-02 DIAGNOSIS — Z7189 Other specified counseling: Secondary | ICD-10-CM | POA: Diagnosis not present

## 2019-07-02 DIAGNOSIS — D693 Immune thrombocytopenic purpura: Secondary | ICD-10-CM

## 2019-07-02 DIAGNOSIS — Z5111 Encounter for antineoplastic chemotherapy: Secondary | ICD-10-CM | POA: Diagnosis not present

## 2019-07-02 DIAGNOSIS — Z1211 Encounter for screening for malignant neoplasm of colon: Secondary | ICD-10-CM | POA: Insufficient documentation

## 2019-07-02 LAB — CBC WITH DIFFERENTIAL/PLATELET
Abs Immature Granulocytes: 0.07 10*3/uL (ref 0.00–0.07)
Basophils Absolute: 0 10*3/uL (ref 0.0–0.1)
Basophils Relative: 0 %
Eosinophils Absolute: 0 10*3/uL (ref 0.0–0.5)
Eosinophils Relative: 0 %
HCT: 44.5 % (ref 36.0–46.0)
Hemoglobin: 14.7 g/dL (ref 12.0–15.0)
Immature Granulocytes: 1 %
Lymphocytes Relative: 15 %
Lymphs Abs: 1.9 10*3/uL (ref 0.7–4.0)
MCH: 28.4 pg (ref 26.0–34.0)
MCHC: 33 g/dL (ref 30.0–36.0)
MCV: 85.9 fL (ref 80.0–100.0)
Monocytes Absolute: 0.6 10*3/uL (ref 0.1–1.0)
Monocytes Relative: 5 %
Neutro Abs: 10.1 10*3/uL — ABNORMAL HIGH (ref 1.7–7.7)
Neutrophils Relative %: 79 %
Platelets: 9 10*3/uL — CL (ref 150–400)
RBC: 5.18 MIL/uL — ABNORMAL HIGH (ref 3.87–5.11)
RDW: 14.1 % (ref 11.5–15.5)
Smear Review: DECREASED
WBC: 12.7 10*3/uL — ABNORMAL HIGH (ref 4.0–10.5)
nRBC: 0 % (ref 0.0–0.2)

## 2019-07-02 MED ORDER — DIPHENHYDRAMINE HCL 50 MG/ML IJ SOLN
25.0000 mg | Freq: Once | INTRAMUSCULAR | Status: AC | PRN
  Administered 2019-07-02: 12:00:00 25 mg via INTRAVENOUS

## 2019-07-02 MED ORDER — DIPHENHYDRAMINE HCL 50 MG/ML IJ SOLN
25.0000 mg | Freq: Once | INTRAMUSCULAR | Status: AC
  Administered 2019-07-02: 25 mg via INTRAVENOUS
  Filled 2019-07-02: qty 1

## 2019-07-02 MED ORDER — SODIUM CHLORIDE 0.9 % IV SOLN
10.0000 mg | Freq: Once | INTRAVENOUS | Status: AC
  Administered 2019-07-02: 10:00:00 10 mg via INTRAVENOUS
  Filled 2019-07-02: qty 10

## 2019-07-02 MED ORDER — METHYLPREDNISOLONE SODIUM SUCC 125 MG IJ SOLR
125.0000 mg | Freq: Once | INTRAMUSCULAR | Status: AC | PRN
  Administered 2019-07-02: 13:00:00 125 mg via INTRAVENOUS

## 2019-07-02 MED ORDER — FAMOTIDINE IN NACL 20-0.9 MG/50ML-% IV SOLN
20.0000 mg | Freq: Two times a day (BID) | INTRAVENOUS | Status: DC
  Administered 2019-07-02: 20 mg via INTRAVENOUS
  Filled 2019-07-02: qty 50

## 2019-07-02 MED ORDER — SODIUM CHLORIDE 0.9 % IV SOLN
375.0000 mg/m2 | Freq: Once | INTRAVENOUS | Status: AC
  Administered 2019-07-02: 700 mg via INTRAVENOUS
  Filled 2019-07-02: qty 50

## 2019-07-02 MED ORDER — SODIUM CHLORIDE 0.9 % IV SOLN
Freq: Once | INTRAVENOUS | Status: AC
  Filled 2019-07-02: qty 250

## 2019-07-02 MED ORDER — ACETAMINOPHEN 325 MG PO TABS
650.0000 mg | ORAL_TABLET | Freq: Once | ORAL | Status: AC
  Administered 2019-07-02: 10:00:00 650 mg via ORAL
  Filled 2019-07-02: qty 2

## 2019-07-02 NOTE — Progress Notes (Signed)
[place]  Telephone:(336) [phone] [phone]) [phone]  ID: Erica Bartlett OB: 23-Mar-1956  MR#: [id number]  IWO#:[id number]  Patient Care Team: Olin Hauser, DO as PCP - General (Family Medicine) Chucky May, MD as Consulting Physician (Psychiatry) Lloyd Huger, MD as Consulting Physician (Oncology) Greg Cutter, LCSW as Social Worker (Licensed Clinical Social Worker)   CHIEF COMPLAINT: ITP  INTERVAL HISTORY: Patient returns to clinic today for further evaluation and reinitiation of Rituxan.  Platelet count earlier this week was found to be significantly decreased to 9.  Patient also admits to increased bruising.  She was initiated on a steroid burst earlier this week with no significant improvement in her platelets over the past 36 hours.  She otherwise feels well.  She has no neurologic complaints. She continues to have chronic back pain. She denies any chest pain, shortness of breath, cough, or hemoptysis.  She has no nausea, vomiting, constipation, or diarrhea. She has no urinary complaints.  Patient offers no further specific complaints today.  REVIEW OF SYSTEMS:    Review of Systems  Constitutional: Negative for fever, malaise/fatigue and weight loss.  HENT: Negative.  Negative for congestion.   Respiratory: Negative.  Negative for cough and shortness of breath.   Cardiovascular: Negative.  Negative for chest pain and leg swelling.  Gastrointestinal: Negative.  Negative for blood in stool, melena and nausea.  Genitourinary: Negative.  Negative for hematuria.  Musculoskeletal: Positive for back pain. Negative for joint pain.  Skin: Negative.  Negative for rash.  Neurological: Negative.  Negative for sensory change, focal weakness, weakness and headaches.  Endo/Heme/Allergies: Bruises/bleeds easily.  Psychiatric/Behavioral: Negative.  Negative for depression. The patient is not nervous/anxious and does not have insomnia.     As  per HPI. Otherwise, a complete review of systems is negative.  PAST MEDICAL HISTORY: Past Medical History:  Diagnosis Date  . Hypertension     PAST SURGICAL HISTORY: Past Surgical History:  Procedure Laterality Date  . ABDOMINAL HYSTERECTOMY    . BREAST BIOPSY Left    age 37 surgical bx neg  . CESAREAN SECTION    . cyst removed     breast, benign    FAMILY HISTORY: Reviewed and unchanged. No reported history of malignancy or chronic disease.     ADVANCED DIRECTIVES:    HEALTH MAINTENANCE: Social History   Tobacco Use  . Smoking status: Current Some Day Smoker    Packs/day: 0.15    Types: Cigarettes  . Smokeless tobacco: Never Used  . Tobacco comment: 1 pack takes 3.5-4 weeks to finish   Substance Use Topics  . Alcohol use: No  . Drug use: No    Allergies  Allergen Reactions  . Doxycycline Nausea And Vomiting  . Penicillins Hives    Current Outpatient Medications  Medication Sig Dispense Refill  . acetaminophen (TYLENOL) 500 MG tablet Take 500 mg by mouth every 6 (six) hours as needed.    Marland Kitchen amLODipine (NORVASC) 10 MG tablet TAKE 1 TABLET (10 MG TOTAL) BY MOUTH DAILY. **NEED APPT** 30 tablet 0  . cetirizine (ZYRTEC) 10 MG tablet Take 1 tablet (10 mg total) by mouth daily. 30 tablet 11  . citalopram (CELEXA) 20 MG tablet Take 20 mg by mouth as needed.     . clonazePAM (KLONOPIN) 1 MG tablet Take 1-2 mg by mouth 3 (three) times daily as needed. Reported on 10/11/2015    . clotrimazole (LOTRIMIN) 1 % cream Apply 1 application topically 2 (two) times daily.  For 2-4 weeks, use on affected area (Patient not taking: Reported on 06/17/2019) 30 g 0  . dextromethorphan 7.5 MG/5ML SYRP Take 7.5 mg by mouth every 6 (six) hours as needed.    . diphenhydrAMINE (BENADRYL) 25 mg capsule Take 25 mg by mouth as needed.    . fluticasone (FLONASE) 50 MCG/ACT nasal spray Place 2 sprays into both nostrils daily. 16 g 11  . hydrochlorothiazide (HYDRODIURIL) 25 MG tablet Take 1 tablet (25  mg total) by mouth daily. 30 tablet 0  . levofloxacin (LEVAQUIN) 500 MG tablet Take 1 tablet (500 mg total) by mouth daily. For 7 days (Patient not taking: Reported on 06/17/2019) 7 tablet 0  . mupirocin ointment (BACTROBAN) 2 % Apply 1 application topically 2 (two) times daily. Apply 15-71mn after steroid cream has rubbed in. For up to 2 weeks 30 g 1  . ondansetron (ZOFRAN) 4 MG tablet Take 1 tablet (4 mg total) by mouth every 8 (eight) hours as needed for nausea or vomiting. 20 tablet 1  . predniSONE (DELTASONE) 50 MG tablet Take 1.5 tabs (757m daily for 7 days. 12 tablet 0  . triamcinolone cream (KENALOG) 0.5 % Apply 1 application topically 2 (two) times daily. To affected areas, for up to 2 weeks. 30 g 1  . VENTOLIN HFA 108 (90 Base) MCG/ACT inhaler INHALE 1-2 PUFFS INTO THE LUNGS EVERY 6 (SIX) HOURS AS NEEDED FOR WHEEZING OR SHORTNESS OF BREATH. 18 g 0   No current facility-administered medications for this visit.   Facility-Administered Medications Ordered in Other Visits  Medication Dose Route Frequency Provider Last Rate Last Admin  . famotidine (PEPCID) IVPB 20 mg premix  20 mg Intravenous Q12H FiLloyd HugerMD 200 mL/hr at 07/02/19 0948 20 mg at 07/02/19 0948    OBJECTIVE: Vitals:   07/02/19 0836  BP: 130/80  Pulse: 62  Resp: 18  Temp: (!) 94.5 F (34.7 C)     Body mass index is 29.76 kg/m.    ECOG FS:0 - Asymptomatic  General: Well-developed, well-nourished, no acute distress. Eyes: Pink conjunctiva, anicteric sclera. HEENT: Normocephalic, moist mucous membranes. Lungs: No audible wheezing or coughing. Heart: Regular rate and rhythm. Abdomen: Soft, nontender, no obvious distention. Musculoskeletal: No edema, cyanosis, or clubbing. Neuro: Alert, answering all questions appropriately. Cranial nerves grossly intact. Skin: No rashes or petechiae noted. Psych: Normal affect.  LAB RESULTS:  Lab Results  Component Value Date   NA 138 08/17/2016   K 3.6 08/17/2016    CL 101 08/17/2016   CO2 25 08/17/2016   GLUCOSE 81 08/17/2016   BUN 11 08/17/2016   CREATININE 0.88 08/17/2016   CALCIUM 9.4 08/17/2016   PROT 7.3 08/17/2016   ALBUMIN 4.0 08/17/2016   AST 25 08/17/2016   ALT 30 (H) 08/17/2016   ALKPHOS 85 08/17/2016   BILITOT 0.3 08/17/2016   GFRNONAA 64 08/04/2015   GFRAA 74 08/04/2015    Lab Results  Component Value Date   WBC 12.7 (H) 07/02/2019   NEUTROABS 10.1 (H) 07/02/2019   HGB 14.7 07/02/2019   HCT 44.5 07/02/2019   MCV 85.9 07/02/2019   PLT 9 (LL) 07/02/2019     HEMATOLOGY HISTORY: Bone marrow biopsy performed on July 28, 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 Patient responded to steroids, but the results were not durable. She completed her first round of weekly Rituxan on Aug 30, 2015, her second treatment completed on November 23, 2016, and her third round of weekly Rituxan  x4 completed on December 20, 2017.   STUDIES: No results found.  ASSESSMENT: Bone marrow biopsy proven ITP.  PLAN:    1. ITP: Patient's platelet count has decreased to 9, therefore will reinitiate Rituxan as above.  She was given a burst dose steroids 75 mg daily x7 days on Monday.  Over the past 36 hours she has seen no appreciable change in her platelet count.  Patient requires increased premedications since she has mild reaction with cycle 1.  Proceed with cycle 1 of 4 of weekly Rituxan.  Return to clinic in 1 week for further evaluation and consideration of cycle 2.   2.  Fatigue: Resolved. 3.  Back pain: Chronic and unchanged. Continue evaluation and treatment per primary care. 4.  Recurrent congestion/cough: Resolved.  Patient has a normal IgG level of 800.  I spent a total of 30 minutes reviewing chart data, face-to-face evaluation with the patient, counseling and coordination of care as detailed above.   Patient expressed understanding and was in agreement with this plan. She also understands that She can call clinic at  any time with any questions, concerns, or complaints.   Lloyd Huger, MD   07/02/2019 2:28 PM

## 2019-07-02 NOTE — Progress Notes (Signed)
1220 pt c/o throat closing and bottom burning, rituxan turned off, benadyrl 25mg  given IVP, solumedrol 100mg  given IVP, L. Allen PA here at chair side, o2sats 100% on RA, NS at 564ml/hr infusing. Plan to wait 15 minutes and restart

## 2019-07-02 NOTE — Progress Notes (Unsigned)
1256 rituxan restarted as ordered at 50% last rate prior to reaction, throat and bottom coplaints resolved, BP 127/76

## 2019-07-02 NOTE — Progress Notes (Signed)
This encounter was created in error - please disregard.

## 2019-07-03 NOTE — Progress Notes (Signed)
[place]  Telephone:(336) [phone] Fax:(336) [phone]  ID: Erica Bartlett OB: 12-14-1955  MR#: [id number]  HWE#:[id number]  Patient Care Team: Olin Hauser, DO as PCP - General (Family Medicine) Chucky May, MD as Consulting Physician (Psychiatry) Lloyd Huger, MD as Consulting Physician (Oncology) Greg Cutter, LCSW as Social Worker (Licensed Clinical Social Worker)   CHIEF COMPLAINT: ITP  INTERVAL HISTORY: Patient returns to clinic today for further evaluation and consideration of cycle 2 of 4 weekly Rituxan.  She had surgery on her toe yesterday, which remains painful, but otherwise feels well.  She denies any easy bleeding or bruising.  She has no neurologic complaints. She continues to have chronic back pain. She denies any chest pain, shortness of breath, cough, or hemoptysis.  She has no nausea, vomiting, constipation, or diarrhea. She has no urinary complaints.  Patient offers no further specific complaints today.  REVIEW OF SYSTEMS:    Review of Systems  Constitutional: Negative for fever, malaise/fatigue and weight loss.  HENT: Negative.  Negative for congestion.   Respiratory: Negative.  Negative for cough and shortness of breath.   Cardiovascular: Negative.  Negative for chest pain and leg swelling.  Gastrointestinal: Negative.  Negative for blood in stool, melena and nausea.  Genitourinary: Negative.  Negative for hematuria.  Musculoskeletal: Positive for back pain. Negative for joint pain.  Skin: Negative.  Negative for rash.  Neurological: Negative.  Negative for sensory change, focal weakness, weakness and headaches.  Endo/Heme/Allergies: Negative.  Does not bruise/bleed easily.  Psychiatric/Behavioral: Negative.  Negative for depression. The patient is not nervous/anxious and does not have insomnia.     As per HPI. Otherwise, a complete review of systems is negative.  PAST MEDICAL HISTORY: Past Medical History:    Diagnosis Date  . Hypertension     PAST SURGICAL HISTORY: Past Surgical History:  Procedure Laterality Date  . ABDOMINAL HYSTERECTOMY    . BREAST BIOPSY Left    age 15 surgical bx neg  . CESAREAN SECTION    . cyst removed     breast, benign    FAMILY HISTORY: Reviewed and unchanged. No reported history of malignancy or chronic disease.     ADVANCED DIRECTIVES:    HEALTH MAINTENANCE: Social History   Tobacco Use  . Smoking status: Current Some Day Smoker    Packs/day: 0.15    Types: Cigarettes  . Smokeless tobacco: Never Used  . Tobacco comment: 1 pack takes 3.5-4 weeks to finish   Substance Use Topics  . Alcohol use: No  . Drug use: No    Allergies  Allergen Reactions  . Doxycycline Nausea And Vomiting  . Penicillins Hives    Current Outpatient Medications  Medication Sig Dispense Refill  . acetaminophen (TYLENOL) 500 MG tablet Take 500 mg by mouth every 6 (six) hours as needed.    Marland Kitchen amLODipine (NORVASC) 10 MG tablet TAKE 1 TABLET (10 MG TOTAL) BY MOUTH DAILY. **NEED APPT** 30 tablet 0  . cetirizine (ZYRTEC) 10 MG tablet Take 1 tablet (10 mg total) by mouth daily. 30 tablet 11  . citalopram (CELEXA) 20 MG tablet Take 20 mg by mouth as needed.     . clonazePAM (KLONOPIN) 1 MG tablet Take 1-2 mg by mouth 3 (three) times daily as needed. Reported on 10/11/2015    . clotrimazole (LOTRIMIN) 1 % cream Apply 1 application topically 2 (two) times daily. For 2-4 weeks, use on affected area 30 g 0  . diphenhydrAMINE (BENADRYL) 25 mg  capsule Take 25 mg by mouth as needed.    . fluticasone (FLONASE) 50 MCG/ACT nasal spray Place 2 sprays into both nostrils daily. 16 g 11  . hydrochlorothiazide (HYDRODIURIL) 25 MG tablet Take 1 tablet (25 mg total) by mouth daily. 30 tablet 0  . mupirocin ointment (BACTROBAN) 2 % Apply 1 application topically 2 (two) times daily. Apply 15-66mn after steroid cream has rubbed in. For up to 2 weeks 30 g 1  . ondansetron (ZOFRAN) 4 MG tablet Take  1 tablet (4 mg total) by mouth every 8 (eight) hours as needed for nausea or vomiting. 20 tablet 1  . predniSONE (DELTASONE) 50 MG tablet Take 1.5 tabs (731m daily for 7 days. 12 tablet 0  . triamcinolone cream (KENALOG) 0.5 % Apply 1 application topically 2 (two) times daily. To affected areas, for up to 2 weeks. 30 g 1  . VENTOLIN HFA 108 (90 Base) MCG/ACT inhaler INHALE 1-2 PUFFS INTO THE LUNGS EVERY 6 (SIX) HOURS AS NEEDED FOR WHEEZING OR SHORTNESS OF BREATH. 18 g 0   No current facility-administered medications for this visit.    OBJECTIVE: Vitals:   07/09/19 0852  BP: 111/66  Pulse: 72  Temp: (!) 96.6 F (35.9 C)     Body mass index is 30.13 kg/m.    ECOG FS:0 - Asymptomatic  General: Well-developed, well-nourished, no acute distress. Eyes: Pink conjunctiva, anicteric sclera. HEENT: Normocephalic, moist mucous membranes. Lungs: No audible wheezing or coughing. Heart: Regular rate and rhythm. Abdomen: Soft, nontender, no obvious distention. Musculoskeletal: No edema, cyanosis, or clubbing. Neuro: Alert, answering all questions appropriately. Cranial nerves grossly intact. Skin: No rashes or petechiae noted. Psych: Normal affect.  LAB RESULTS:  Lab Results  Component Value Date   NA 138 08/17/2016   K 3.6 08/17/2016   CL 101 08/17/2016   CO2 25 08/17/2016   GLUCOSE 81 08/17/2016   BUN 11 08/17/2016   CREATININE 0.88 08/17/2016   CALCIUM 9.4 08/17/2016   PROT 7.3 08/17/2016   ALBUMIN 4.0 08/17/2016   AST 25 08/17/2016   ALT 30 (H) 08/17/2016   ALKPHOS 85 08/17/2016   BILITOT 0.3 08/17/2016   GFRNONAA 64 08/04/2015   GFRAA 74 08/04/2015    Lab Results  Component Value Date   WBC 22.2 (H) 07/09/2019   NEUTROABS 20.0 (H) 07/09/2019   HGB 14.7 07/09/2019   HCT 43.1 07/09/2019   MCV 84.0 07/09/2019   PLT 126 (L) 07/09/2019     HEMATOLOGY HISTORY: Bone marrow biopsy performed on July 28, 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  Patient responded to steroids, but the results were not durable. She completed her first round of weekly Rituxan on Aug 30, 2015, her second treatment completed on November 23, 2016, and her third round of weekly Rituxan x4 completed on December 20, 2017.   STUDIES: No results found.  ASSESSMENT: Bone marrow biopsy proven ITP.  PLAN:    1. ITP: Patient's platelet count has improved significantly to 126 with prednisone burst and her first infusion of Rituxan.  She has been instructed to complete her course of prednisone. Patient requires increased premedications with Rituxan since she has mild reaction with cycle 1.  Proceed with cycle 2 of 4 weekly Rituxan.  Return to clinic in 1 week for further evaluation and consideration of cycle 3. 2.  Leukocytosis: Likely secondary to steroids, monitor. 3.  Back pain: Chronic and unchanged. Continue evaluation and treatment per primary care. 4.  Recurrent  congestion/cough: Resolved.  Patient has a normal IgG level of 800.  I spent a total of 30 minutes reviewing chart data, face-to-face evaluation with the patient, counseling and coordination of care as detailed above.    Patient expressed understanding and was in agreement with this plan. She also understands that She can call clinic at any time with any questions, concerns, or complaints.   Lloyd Huger, MD   07/09/2019 9:36 AM

## 2019-07-04 ENCOUNTER — Telehealth: Payer: Self-pay | Admitting: *Deleted

## 2019-07-04 NOTE — Telephone Encounter (Signed)
Yes

## 2019-07-04 NOTE — Telephone Encounter (Signed)
Call returned to patient and advised OK to get COVID vaccine

## 2019-07-04 NOTE — Telephone Encounter (Signed)
Patient had chemotherapy 3/10 and her next treatment is 3/17. She is scheduled for her second COVID vaccine tomorrow and is asking if that will be alright to get. Please advise

## 2019-07-05 DIAGNOSIS — Z23 Encounter for immunization: Secondary | ICD-10-CM | POA: Diagnosis not present

## 2019-07-08 ENCOUNTER — Encounter: Payer: Self-pay | Admitting: Podiatry

## 2019-07-08 ENCOUNTER — Encounter: Payer: Self-pay | Admitting: *Deleted

## 2019-07-08 ENCOUNTER — Ambulatory Visit: Payer: Medicare HMO | Admitting: Podiatry

## 2019-07-08 ENCOUNTER — Other Ambulatory Visit: Payer: Self-pay

## 2019-07-08 VITALS — Temp 97.0°F

## 2019-07-08 DIAGNOSIS — L989 Disorder of the skin and subcutaneous tissue, unspecified: Secondary | ICD-10-CM | POA: Diagnosis not present

## 2019-07-08 DIAGNOSIS — L600 Ingrowing nail: Secondary | ICD-10-CM | POA: Diagnosis not present

## 2019-07-08 NOTE — Patient Instructions (Signed)

## 2019-07-09 ENCOUNTER — Inpatient Hospital Stay (HOSPITAL_BASED_OUTPATIENT_CLINIC_OR_DEPARTMENT_OTHER): Payer: Medicare HMO | Admitting: Oncology

## 2019-07-09 ENCOUNTER — Encounter: Payer: Self-pay | Admitting: Oncology

## 2019-07-09 ENCOUNTER — Inpatient Hospital Stay: Payer: Medicare HMO

## 2019-07-09 ENCOUNTER — Ambulatory Visit: Payer: Medicare HMO

## 2019-07-09 VITALS — BP 129/72 | HR 72 | Temp 97.0°F | Resp 18

## 2019-07-09 VITALS — BP 111/66 | HR 72 | Temp 96.6°F | Wt 170.1 lb

## 2019-07-09 DIAGNOSIS — D693 Immune thrombocytopenic purpura: Secondary | ICD-10-CM

## 2019-07-09 DIAGNOSIS — Z5111 Encounter for antineoplastic chemotherapy: Secondary | ICD-10-CM | POA: Diagnosis not present

## 2019-07-09 DIAGNOSIS — R112 Nausea with vomiting, unspecified: Secondary | ICD-10-CM | POA: Diagnosis not present

## 2019-07-09 LAB — CBC WITH DIFFERENTIAL/PLATELET
Abs Immature Granulocytes: 0.46 10*3/uL — ABNORMAL HIGH (ref 0.00–0.07)
Basophils Absolute: 0.1 10*3/uL (ref 0.0–0.1)
Basophils Relative: 0 %
Eosinophils Absolute: 0 10*3/uL (ref 0.0–0.5)
Eosinophils Relative: 0 %
HCT: 43.1 % (ref 36.0–46.0)
Hemoglobin: 14.7 g/dL (ref 12.0–15.0)
Immature Granulocytes: 2 %
Lymphocytes Relative: 5 %
Lymphs Abs: 1.1 10*3/uL (ref 0.7–4.0)
MCH: 28.7 pg (ref 26.0–34.0)
MCHC: 34.1 g/dL (ref 30.0–36.0)
MCV: 84 fL (ref 80.0–100.0)
Monocytes Absolute: 0.6 10*3/uL (ref 0.1–1.0)
Monocytes Relative: 3 %
Neutro Abs: 20 10*3/uL — ABNORMAL HIGH (ref 1.7–7.7)
Neutrophils Relative %: 90 %
Platelets: 126 10*3/uL — ABNORMAL LOW (ref 150–400)
RBC: 5.13 MIL/uL — ABNORMAL HIGH (ref 3.87–5.11)
RDW: 14.1 % (ref 11.5–15.5)
WBC: 22.2 10*3/uL — ABNORMAL HIGH (ref 4.0–10.5)
nRBC: 0 % (ref 0.0–0.2)

## 2019-07-09 MED ORDER — ACETAMINOPHEN 325 MG PO TABS
650.0000 mg | ORAL_TABLET | Freq: Once | ORAL | Status: AC
  Administered 2019-07-09: 650 mg via ORAL
  Filled 2019-07-09: qty 2

## 2019-07-09 MED ORDER — ONDANSETRON HCL 4 MG PO TABS: 4.0000 mg | ORAL_TABLET | Freq: Three times a day (TID) | ORAL | 1 refills | Status: DC | PRN

## 2019-07-09 MED ORDER — FAMOTIDINE IN NACL 20-0.9 MG/50ML-% IV SOLN
20.0000 mg | Freq: Two times a day (BID) | INTRAVENOUS | Status: DC
  Administered 2019-07-09: 20 mg via INTRAVENOUS

## 2019-07-09 MED ORDER — SODIUM CHLORIDE 0.9 % IV SOLN
Freq: Once | INTRAVENOUS | Status: AC
  Filled 2019-07-09: qty 250

## 2019-07-09 MED ORDER — SODIUM CHLORIDE 0.9 % IV SOLN
10.0000 mg | Freq: Once | INTRAVENOUS | Status: AC
  Administered 2019-07-09: 11:00:00 10 mg via INTRAVENOUS
  Filled 2019-07-09: qty 1

## 2019-07-09 MED ORDER — SODIUM CHLORIDE 0.9 % IV SOLN
375.0000 mg/m2 | Freq: Once | INTRAVENOUS | Status: AC
  Administered 2019-07-09: 700 mg via INTRAVENOUS
  Filled 2019-07-09: qty 50

## 2019-07-09 MED ORDER — DIPHENHYDRAMINE HCL 50 MG/ML IJ SOLN
25.0000 mg | Freq: Once | INTRAMUSCULAR | Status: AC
  Administered 2019-07-09: 10:00:00 25 mg via INTRAVENOUS
  Filled 2019-07-09: qty 1

## 2019-07-11 NOTE — Progress Notes (Signed)
   Subjective: Patient presents today for evaluation of pain to the lateral border of the left 2nd toe. Patient is concerned for possible ingrown nail. Walking and wearing shoes increases the pain. She has been applying Mupirocin ointment to the area.  She also reports multiple painful callus lesions to the plantar aspects of the bilateral feet that have been present for the past several months. Walking and bearing weight increases the pain. She has been trimming the calluses down and soaking the feet in Epsom salt for treatment. Patient presents today for further treatment and evaluation.  Past Medical History:  Diagnosis Date  . Hypertension     Objective:  General: Well developed, nourished, in no acute distress, alert and oriented x3   Dermatology: Skin is warm, dry and supple bilateral. Lateral border of the left 2nd toe appears to be erythematous with evidence of an ingrowing nail. Pain on palpation noted to the border of the nail fold. Hyperkeratotic lesion(s) present on the bilateral feet. Pain on palpation with a central nucleated core noted.   Vascular: Dorsalis Pedis artery and Posterior Tibial artery pedal pulses palpable. No lower extremity edema noted.   Neruologic: Grossly intact via light touch bilateral.  Musculoskeletal: Muscular strength within normal limits in all groups bilateral. Normal range of motion noted to all pedal and ankle joints.   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 Patient evaluated.  2. Discussed treatment alternatives and plan of care. Explained nail avulsion procedure and post procedure course to patient. 3. Patient opted for permanent partial nail avulsion of the lateral border left 2nd toe.  4. Prior to procedure, local anesthesia infiltration utilized using 3 ml of a 50:50 mixture of 2% plain  lidocaine and 0.5% plain marcaine in a normal digital block fashion and a betadine prep performed.  5. Partial permanent nail avulsion with chemical matrixectomy performed using [id number] applications of phenol followed by alcohol flush.  6. Light dressing applied. 7. Excisional debridement of keratotic lesion(s) using a chisel blade was performed without incident. Salinocaine and light dressing applied.  8. Recommended OTC corn and callus remover daily.  9. Return to clinic in 2 weeks.  Custodian at [employer].    Edrick Kins, DPM [facility]  Dr. Edrick Kins, [address]                                        [place], [place] [address]                Office [phone]  Fax [phone]

## 2019-07-11 NOTE — Progress Notes (Signed)
Erica Bartlett  Telephone:(336) [phone] Fax:(336) [phone]  ID: Erica Bartlett OB: [birth year]  MR#: [id number]  VCB#:[id number]  Patient Care Team: Olin Hauser, DO as PCP - General (Family Medicine) Chucky May, MD as Consulting Physician (Psychiatry) Lloyd Huger, MD as Consulting Physician (Oncology) Greg Cutter, LCSW as Social Worker (Licensed Clinical Social Worker)   CHIEF COMPLAINT: ITP  INTERVAL HISTORY: Patient returns to clinic today for further evaluation and consideration of cycle 3 of 4 of weekly Rituxan.  She currently feels well and is asymptomatic.  She no longer has toe pain.  She denies any easy bleeding or bruising.  She has no neurologic complaints. She continues to have chronic back pain. She denies any chest pain, shortness of breath, cough, or hemoptysis.  She has no nausea, vomiting, constipation, or diarrhea. She has no urinary complaints.  Patient offers no specific complaints today.  REVIEW OF SYSTEMS:    Review of Systems  Constitutional: Negative for fever, malaise/fatigue and weight loss.  HENT: Negative.  Negative for congestion.   Respiratory: Negative.  Negative for cough and shortness of breath.   Cardiovascular: Negative.  Negative for chest pain and leg swelling.  Gastrointestinal: Negative.  Negative for blood in stool, melena and nausea.  Genitourinary: Negative.  Negative for hematuria.  Musculoskeletal: Positive for back pain. Negative for joint pain.  Skin: Negative.  Negative for rash.  Neurological: Negative.  Negative for sensory change, focal weakness, weakness and headaches.  Endo/Heme/Allergies: Negative.  Does not bruise/bleed easily.  Psychiatric/Behavioral: Negative.  Negative for depression. The patient is not nervous/anxious and does not have insomnia.     As per HPI. Otherwise, a complete review of systems is negative.  PAST MEDICAL HISTORY: Past Medical History:  Diagnosis Date   . Hypertension     PAST SURGICAL HISTORY: Past Surgical History:  Procedure Laterality Date  . ABDOMINAL HYSTERECTOMY    . BREAST BIOPSY Left    age 64 surgical bx neg  . CESAREAN SECTION    . cyst removed     breast, benign    FAMILY HISTORY: Reviewed and unchanged. No reported history of malignancy or chronic disease.     ADVANCED DIRECTIVES:    HEALTH MAINTENANCE: Social History   Tobacco Use  . Smoking status: Current Some Day Smoker    Packs/day: 0.15    Types: Cigarettes  . Smokeless tobacco: Never Used  . Tobacco comment: 1 pack takes 3.5-4 weeks to finish   Substance Use Topics  . Alcohol use: No  . Drug use: No    Allergies  Allergen Reactions  . Doxycycline Nausea And Vomiting  . Penicillins Hives    Current Outpatient Medications  Medication Sig Dispense Refill  . acetaminophen (TYLENOL) 500 MG tablet Take 500 mg by mouth every 6 (six) hours as needed.    Marland Kitchen amLODipine (NORVASC) 10 MG tablet TAKE 1 TABLET (10 MG TOTAL) BY MOUTH DAILY. **NEED APPT** 30 tablet 0  . cetirizine (ZYRTEC) 10 MG tablet Take 1 tablet (10 mg total) by mouth daily. 30 tablet 11  . citalopram (CELEXA) 20 MG tablet Take 20 mg by mouth as needed.     . clonazePAM (KLONOPIN) 1 MG tablet Take 1-2 mg by mouth 3 (three) times daily as needed. Reported on 10/11/2015    . clotrimazole (LOTRIMIN) 1 % cream Apply 1 application topically 2 (two) times daily. For 2-4 weeks, use on affected area 30 g 0  . diphenhydrAMINE (BENADRYL) 25 mg capsule  Take 25 mg by mouth as needed.    . fluticasone (FLONASE) 50 MCG/ACT nasal spray Place 2 sprays into both nostrils daily. 16 g 11  . hydrochlorothiazide (HYDRODIURIL) 25 MG tablet Take 1 tablet (25 mg total) by mouth daily. 30 tablet 0  . mupirocin ointment (BACTROBAN) 2 % Apply 1 application topically 2 (two) times daily. Apply 15-1mn after steroid cream has rubbed in. For up to 2 weeks 30 g 1  . ondansetron (ZOFRAN) 4 MG tablet Take 1 tablet (4 mg  total) by mouth every 8 (eight) hours as needed for nausea or vomiting. 20 tablet 1  . predniSONE (DELTASONE) 50 MG tablet Take 1.5 tabs (723m daily for 7 days. 12 tablet 0  . triamcinolone cream (KENALOG) 0.5 % Apply 1 application topically 2 (two) times daily. To affected areas, for up to 2 weeks. 30 g 1  . VENTOLIN HFA 108 (90 Base) MCG/ACT inhaler INHALE 1-2 PUFFS INTO THE LUNGS EVERY 6 (SIX) HOURS AS NEEDED FOR WHEEZING OR SHORTNESS OF BREATH. 18 g 0   No current facility-administered medications for this visit.   Facility-Administered Medications Ordered in Other Visits  Medication Dose Route Frequency Provider Last Rate Last Admin  . acetaminophen (TYLENOL) tablet 650 mg  650 mg Oral Once FiLloyd HugerMD      . dexamethasone (DECADRON) 10 mg in sodium chloride 0.9 % 50 mL IVPB  10 mg Intravenous Once FiLloyd HugerMD      . diphenhydrAMINE (BENADRYL) injection 25 mg  25 mg Intravenous Once FiLloyd HugerMD      . famotidine (PEPCID) IVPB 20 mg premix  20 mg Intravenous Q12H FiLloyd HugerMD      . riTUXimab-pvvr (RUXIENCE) 700 mg in sodium chloride 0.9 % 250 mL (2.1875 mg/mL) infusion  375 mg/m2 (Treatment Plan Recorded) Intravenous Once FiLloyd HugerMD        OBJECTIVE: Vitals:   07/16/19 0922  BP: 131/68  Pulse: 66  Temp: (!) 95.6 F (35.3 C)     Body mass index is 30.31 kg/m.    ECOG FS:0 - Asymptomatic  General: Well-developed, well-nourished, no acute distress. Eyes: Pink conjunctiva, anicteric sclera. HEENT: Normocephalic, moist mucous membranes. Lungs: No audible wheezing or coughing. Heart: Regular rate and rhythm. Abdomen: Soft, nontender, no obvious distention. Musculoskeletal: No edema, cyanosis, or clubbing. Neuro: Alert, answering all questions appropriately. Cranial nerves grossly intact. Skin: No rashes or petechiae noted. Psych: Normal affect.  LAB RESULTS:  Lab Results  Component Value Date   NA 138 08/17/2016   K  3.6 08/17/2016   CL 101 08/17/2016   CO2 25 08/17/2016   GLUCOSE 81 08/17/2016   BUN 11 08/17/2016   CREATININE 0.88 08/17/2016   CALCIUM 9.4 08/17/2016   PROT 7.3 08/17/2016   ALBUMIN 4.0 08/17/2016   AST 25 08/17/2016   ALT 30 (H) 08/17/2016   ALKPHOS 85 08/17/2016   BILITOT 0.3 08/17/2016   GFRNONAA 64 08/04/2015   GFRAA 74 08/04/2015    Lab Results  Component Value Date   WBC 9.6 07/16/2019   NEUTROABS 7.2 07/16/2019   HGB 14.1 07/16/2019   HCT 42.2 07/16/2019   MCV 85.8 07/16/2019   PLT 76 (L) 07/16/2019     HEMATOLOGY HISTORY: Bone marrow biopsy performed on July 28, 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 Patient responded to steroids, but the results were not durable. She completed her first round of weekly Rituxan on  Aug 30, 2015, her second treatment completed on November 23, 2016, and her third round of weekly Rituxan x4 completed on December 20, 2017.   STUDIES: No results found.  ASSESSMENT: Bone marrow biopsy proven ITP.  PLAN:    1. ITP: Patient's platelet count has trended down to 76, but this is likely wearing off of the prednisone burst she received several weeks ago.  She requires increased premedications with Rituxan since she has mild reaction with cycle 1.  Proceed with cycle 3 of 4 of weekly Rituxan today.  Return to clinic in 1 week for further evaluation and consideration of cycle 4. 2.  Leukocytosis: Resolved.   3.  Back pain: Chronic and unchanged. Continue evaluation and treatment per primary care. 4.  Recurrent congestion/cough: Resolved.  Patient has a normal IgG level of 800.  I spent a total of 30 minutes reviewing chart data, face-to-face evaluation with the patient, counseling and coordination of care as detailed above.   Patient expressed understanding and was in agreement with this plan. She also understands that She can call clinic at any time with any questions, concerns, or complaints.   Lloyd Huger,  MD   07/16/2019 10:16 AM

## 2019-07-15 ENCOUNTER — Encounter: Payer: Self-pay | Admitting: Oncology

## 2019-07-15 ENCOUNTER — Other Ambulatory Visit: Payer: Self-pay

## 2019-07-15 NOTE — Progress Notes (Signed)
Patient pre screened for office appointment, no questions or concerns today. Patient reminded of upcoming appointment time and date. 

## 2019-07-16 ENCOUNTER — Inpatient Hospital Stay: Payer: Medicare HMO

## 2019-07-16 ENCOUNTER — Inpatient Hospital Stay (HOSPITAL_BASED_OUTPATIENT_CLINIC_OR_DEPARTMENT_OTHER): Payer: Medicare HMO | Admitting: Oncology

## 2019-07-16 VITALS — BP 131/68 | HR 66 | Temp 95.6°F | Wt 171.1 lb

## 2019-07-16 DIAGNOSIS — Z5111 Encounter for antineoplastic chemotherapy: Secondary | ICD-10-CM | POA: Diagnosis not present

## 2019-07-16 DIAGNOSIS — D693 Immune thrombocytopenic purpura: Secondary | ICD-10-CM

## 2019-07-16 LAB — CBC WITH DIFFERENTIAL/PLATELET
Abs Immature Granulocytes: 0.14 10*3/uL — ABNORMAL HIGH (ref 0.00–0.07)
Basophils Absolute: 0 10*3/uL (ref 0.0–0.1)
Basophils Relative: 0 %
Eosinophils Absolute: 0.1 10*3/uL (ref 0.0–0.5)
Eosinophils Relative: 2 %
HCT: 42.2 % (ref 36.0–46.0)
Hemoglobin: 14.1 g/dL (ref 12.0–15.0)
Immature Granulocytes: 2 %
Lymphocytes Relative: 16 %
Lymphs Abs: 1.5 10*3/uL (ref 0.7–4.0)
MCH: 28.7 pg (ref 26.0–34.0)
MCHC: 33.4 g/dL (ref 30.0–36.0)
MCV: 85.8 fL (ref 80.0–100.0)
Monocytes Absolute: 0.6 10*3/uL (ref 0.1–1.0)
Monocytes Relative: 6 %
Neutro Abs: 7.2 10*3/uL (ref 1.7–7.7)
Neutrophils Relative %: 74 %
Platelets: 76 10*3/uL — ABNORMAL LOW (ref 150–400)
RBC: 4.92 MIL/uL (ref 3.87–5.11)
RDW: 14.5 % (ref 11.5–15.5)
WBC: 9.6 10*3/uL (ref 4.0–10.5)
nRBC: 0 % (ref 0.0–0.2)

## 2019-07-16 MED ORDER — FAMOTIDINE IN NACL 20-0.9 MG/50ML-% IV SOLN
20.0000 mg | Freq: Two times a day (BID) | INTRAVENOUS | Status: DC
  Administered 2019-07-16: 20 mg via INTRAVENOUS
  Filled 2019-07-16: qty 50

## 2019-07-16 MED ORDER — SODIUM CHLORIDE 0.9 % IV SOLN
10.0000 mg | Freq: Once | INTRAVENOUS | Status: AC
  Administered 2019-07-16: 10 mg via INTRAVENOUS
  Filled 2019-07-16: qty 1

## 2019-07-16 MED ORDER — DIPHENHYDRAMINE HCL 50 MG/ML IJ SOLN
25.0000 mg | Freq: Once | INTRAMUSCULAR | Status: AC
  Administered 2019-07-16: 25 mg via INTRAVENOUS
  Filled 2019-07-16: qty 1

## 2019-07-16 MED ORDER — ACETAMINOPHEN 325 MG PO TABS
650.0000 mg | ORAL_TABLET | Freq: Once | ORAL | Status: AC
  Administered 2019-07-16: 650 mg via ORAL
  Filled 2019-07-16: qty 2

## 2019-07-16 MED ORDER — SODIUM CHLORIDE 0.9 % IV SOLN
Freq: Once | INTRAVENOUS | Status: AC
  Filled 2019-07-16: qty 250

## 2019-07-16 MED ORDER — SODIUM CHLORIDE 0.9 % IV SOLN
375.0000 mg/m2 | Freq: Once | INTRAVENOUS | Status: AC
  Administered 2019-07-16: 700 mg via INTRAVENOUS
  Filled 2019-07-16: qty 20

## 2019-07-19 NOTE — Progress Notes (Signed)
Erica Bartlett  Telephone:(336) [phone] Fax:(336) [phone]  ID: Markeeta Scalf Duling OB: 29-Oct-1955  MR#: [id number]  SEG#:[id number]  Patient Care Team: Olin Hauser, DO as PCP - General (Family Medicine) Chucky May, MD as Consulting Physician (Psychiatry) Lloyd Huger, MD as Consulting Physician (Oncology) Greg Cutter, LCSW as Social Worker (Licensed Clinical Social Worker)   CHIEF COMPLAINT: ITP  INTERVAL HISTORY: Patient returns to clinic today for further evaluation and consideration of cycle 4 of 4 weekly Rituxan.  She has having significant bilateral foot pain after having areas of callus removed by podiatry yesterday.  She otherwise feels well.  Labs dictated she denies any easy bleeding or bruising.  She has no neurologic complaints. She continues to have chronic back pain. She denies any chest pain, shortness of breath, cough, or hemoptysis.  She has no nausea, vomiting, constipation, or diarrhea. She has no urinary complaints.  Patient offers no further specific complaints today.  REVIEW OF SYSTEMS:    Review of Systems  Constitutional: Negative for fever, malaise/fatigue and weight loss.  HENT: Negative.  Negative for congestion.   Respiratory: Negative.  Negative for cough and shortness of breath.   Cardiovascular: Negative.  Negative for chest pain and leg swelling.  Gastrointestinal: Negative.  Negative for blood in stool, melena and nausea.  Genitourinary: Negative.  Negative for hematuria.  Musculoskeletal: Positive for back pain. Negative for joint pain.  Skin: Negative.  Negative for rash.  Neurological: Negative.  Negative for sensory change, focal weakness, weakness and headaches.  Endo/Heme/Allergies: Negative.  Does not bruise/bleed easily.  Psychiatric/Behavioral: Negative.  Negative for depression. The patient is not nervous/anxious and does not have insomnia.     As per HPI. Otherwise, a complete review of systems is  negative.  PAST MEDICAL HISTORY: Past Medical History:  Diagnosis Date  . Hypertension     PAST SURGICAL HISTORY: Past Surgical History:  Procedure Laterality Date  . ABDOMINAL HYSTERECTOMY    . BREAST BIOPSY Left    age 64 surgical bx neg  . CESAREAN SECTION    . cyst removed     breast, benign    FAMILY HISTORY: Reviewed and unchanged. No reported history of malignancy or chronic disease.     ADVANCED DIRECTIVES:    HEALTH MAINTENANCE: Social History   Tobacco Use  . Smoking status: Current Some Day Smoker    Packs/day: 0.15    Types: Cigarettes  . Smokeless tobacco: Never Used  . Tobacco comment: 1 pack takes 3.5-4 weeks to finish   Substance Use Topics  . Alcohol use: No  . Drug use: No    Allergies  Allergen Reactions  . Doxycycline Nausea And Vomiting  . Penicillins Hives    Current Outpatient Medications  Medication Sig Dispense Refill  . acetaminophen (TYLENOL) 500 MG tablet Take 500 mg by mouth every 6 (six) hours as needed.    Marland Kitchen amLODipine (NORVASC) 10 MG tablet TAKE 1 TABLET (10 MG TOTAL) BY MOUTH DAILY. **NEED APPT** 30 tablet 0  . cetirizine (ZYRTEC) 10 MG tablet Take 1 tablet (10 mg total) by mouth daily. 30 tablet 11  . citalopram (CELEXA) 20 MG tablet Take 20 mg by mouth as needed.     . clonazePAM (KLONOPIN) 1 MG tablet Take 1-2 mg by mouth 3 (three) times daily as needed. Reported on 10/11/2015    . clotrimazole (LOTRIMIN) 1 % cream Apply 1 application topically 2 (two) times daily. For 2-4 weeks, use on affected area 30  g 0  . diphenhydrAMINE (BENADRYL) 25 mg capsule Take 25 mg by mouth as needed.    . fluticasone (FLONASE) 50 MCG/ACT nasal spray Place 2 sprays into both nostrils daily. 16 g 11  . hydrochlorothiazide (HYDRODIURIL) 25 MG tablet Take 1 tablet (25 mg total) by mouth daily. 30 tablet 0  . mupirocin ointment (BACTROBAN) 2 % Apply 1 application topically 2 (two) times daily. Apply 15-54mn after steroid cream has rubbed in. For up  to 2 weeks 30 g 1  . ondansetron (ZOFRAN) 4 MG tablet Take 1 tablet (4 mg total) by mouth every 8 (eight) hours as needed for nausea or vomiting. 20 tablet 1  . predniSONE (DELTASONE) 50 MG tablet Take 1.5 tabs (751m daily for 7 days. 12 tablet 0  . triamcinolone cream (KENALOG) 0.5 % Apply 1 application topically 2 (two) times daily. To affected areas, for up to 2 weeks. 30 g 1  . VENTOLIN HFA 108 (90 Base) MCG/ACT inhaler INHALE 1-2 PUFFS INTO THE LUNGS EVERY 6 (SIX) HOURS AS NEEDED FOR WHEEZING OR SHORTNESS OF BREATH. 18 g 0   No current facility-administered medications for this visit.   Facility-Administered Medications Ordered in Other Visits  Medication Dose Route Frequency Provider Last Rate Last Admin  . famotidine (PEPCID) IVPB 20 mg premix  20 mg Intravenous Q12H FiLloyd HugerMD   Stopped at 07/23/19 1056    OBJECTIVE: Vitals:   07/23/19 0913  BP: (!) 158/81  Pulse: 66  Resp: (!) 22  Temp: (!) 96.2 F (35.7 C)  SpO2: 100%     Body mass index is 30.11 kg/m.    ECOG FS:0 - Asymptomatic  General: Well-developed, well-nourished, no acute distress. Eyes: Pink conjunctiva, anicteric sclera. HEENT: Normocephalic, moist mucous membranes. Lungs: No audible wheezing or coughing. Heart: Regular rate and rhythm. Abdomen: Soft, nontender, no obvious distention. Musculoskeletal: No edema, cyanosis, or clubbing. Neuro: Alert, answering all questions appropriately. Cranial nerves grossly intact. Skin: No rashes or petechiae noted.  Multiple ulcers on soles of bilateral feet. Psych: Normal affect.  LAB RESULTS:  Lab Results  Component Value Date   NA 138 08/17/2016   K 3.6 08/17/2016   CL 101 08/17/2016   CO2 25 08/17/2016   GLUCOSE 81 08/17/2016   BUN 11 08/17/2016   CREATININE 0.88 08/17/2016   CALCIUM 9.4 08/17/2016   PROT 7.3 08/17/2016   ALBUMIN 4.0 08/17/2016   AST 25 08/17/2016   ALT 30 (H) 08/17/2016   ALKPHOS 85 08/17/2016   BILITOT 0.3 08/17/2016    GFRNONAA 64 08/04/2015   GFRAA 74 08/04/2015    Lab Results  Component Value Date   WBC 11.2 (H) 07/23/2019   NEUTROABS 10.5 (H) 07/23/2019   HGB 14.5 07/23/2019   HCT 43.3 07/23/2019   MCV 85.4 07/23/2019   PLT 74 (L) 07/23/2019     HEMATOLOGY HISTORY: Bone marrow biopsy performed on July 28, 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 Patient responded to steroids, but the results were not durable. She completed her first round of weekly Rituxan on Aug 30, 2015, her second treatment completed on November 23, 2016, and her third round of weekly Rituxan x4 completed on December 20, 2017.   STUDIES: No results found.  ASSESSMENT: Bone marrow biopsy proven ITP.  PLAN:    1. ITP: Patient's platelet count is stable at 74. She requires increased premedications with Rituxan since she has mild reaction with cycle 1.  Proceed with cycle 4  of 4 of weekly Rituxan today.  Return to clinic in 4 weeks for laboratory work and video assisted telemedicine visit.   2.  Leukocytosis: Mild, monitor. 3.  Back pain: Chronic and unchanged. Continue evaluation and treatment per primary care. 4.  Recurrent congestion/cough: Resolved.  Patient has a normal IgG level of 800. 5.  Foot pain: Secondary to podiatry procedure.  Patient was given Percocet for symptomatic relief today and instructed to call podiatry for further evaluation.  I spent a total of 30 minutes reviewing chart data, face-to-face evaluation with the patient, counseling and coordination of care as detailed above.   Patient expressed understanding and was in agreement with this plan. She also understands that She can call clinic at any time with any questions, concerns, or complaints.   Lloyd Huger, MD   07/23/2019 3:36 PM

## 2019-07-22 ENCOUNTER — Ambulatory Visit: Payer: Medicare HMO | Admitting: Podiatry

## 2019-07-22 ENCOUNTER — Other Ambulatory Visit: Payer: Self-pay

## 2019-07-22 DIAGNOSIS — L600 Ingrowing nail: Secondary | ICD-10-CM | POA: Diagnosis not present

## 2019-07-22 DIAGNOSIS — L989 Disorder of the skin and subcutaneous tissue, unspecified: Secondary | ICD-10-CM | POA: Diagnosis not present

## 2019-07-23 ENCOUNTER — Telehealth: Payer: Self-pay

## 2019-07-23 ENCOUNTER — Inpatient Hospital Stay: Payer: Medicare HMO

## 2019-07-23 ENCOUNTER — Encounter: Payer: Self-pay | Admitting: Oncology

## 2019-07-23 ENCOUNTER — Inpatient Hospital Stay (HOSPITAL_BASED_OUTPATIENT_CLINIC_OR_DEPARTMENT_OTHER): Payer: Medicare HMO | Admitting: Oncology

## 2019-07-23 VITALS — BP 158/81 | HR 66 | Temp 96.2°F | Resp 22 | Wt 170.0 lb

## 2019-07-23 DIAGNOSIS — D693 Immune thrombocytopenic purpura: Secondary | ICD-10-CM

## 2019-07-23 DIAGNOSIS — Z5111 Encounter for antineoplastic chemotherapy: Secondary | ICD-10-CM | POA: Diagnosis not present

## 2019-07-23 DIAGNOSIS — M25579 Pain in unspecified ankle and joints of unspecified foot: Secondary | ICD-10-CM

## 2019-07-23 LAB — CBC WITH DIFFERENTIAL/PLATELET
Abs Immature Granulocytes: 0.05 10*3/uL (ref 0.00–0.07)
Basophils Absolute: 0 10*3/uL (ref 0.0–0.1)
Basophils Relative: 0 %
Eosinophils Absolute: 0 10*3/uL (ref 0.0–0.5)
Eosinophils Relative: 0 %
HCT: 43.3 % (ref 36.0–46.0)
Hemoglobin: 14.5 g/dL (ref 12.0–15.0)
Immature Granulocytes: 0 %
Lymphocytes Relative: 4 %
Lymphs Abs: 0.5 10*3/uL — ABNORMAL LOW (ref 0.7–4.0)
MCH: 28.6 pg (ref 26.0–34.0)
MCHC: 33.5 g/dL (ref 30.0–36.0)
MCV: 85.4 fL (ref 80.0–100.0)
Monocytes Absolute: 0.1 10*3/uL (ref 0.1–1.0)
Monocytes Relative: 1 %
Neutro Abs: 10.5 10*3/uL — ABNORMAL HIGH (ref 1.7–7.7)
Neutrophils Relative %: 95 %
Platelets: 74 10*3/uL — ABNORMAL LOW (ref 150–400)
RBC: 5.07 MIL/uL (ref 3.87–5.11)
RDW: 14.6 % (ref 11.5–15.5)
WBC: 11.2 10*3/uL — ABNORMAL HIGH (ref 4.0–10.5)
nRBC: 0 % (ref 0.0–0.2)

## 2019-07-23 MED ORDER — OXYCODONE-ACETAMINOPHEN 5-325 MG PO TABS
1.0000 | ORAL_TABLET | Freq: Once | ORAL | Status: AC | PRN
  Administered 2019-07-23: 1 via ORAL
  Filled 2019-07-23: qty 1

## 2019-07-23 MED ORDER — SODIUM CHLORIDE 0.9 % IV SOLN
375.0000 mg/m2 | Freq: Once | INTRAVENOUS | Status: AC
  Administered 2019-07-23: 700 mg via INTRAVENOUS
  Filled 2019-07-23: qty 50

## 2019-07-23 MED ORDER — OXYCODONE-ACETAMINOPHEN 5-325 MG PO TABS
1.0000 | ORAL_TABLET | Freq: Once | ORAL | Status: AC
  Administered 2019-07-23: 1 via ORAL
  Filled 2019-07-23: qty 1

## 2019-07-23 MED ORDER — SODIUM CHLORIDE 0.9 % IV SOLN
Freq: Once | INTRAVENOUS | Status: AC
  Filled 2019-07-23: qty 250

## 2019-07-23 MED ORDER — FAMOTIDINE IN NACL 20-0.9 MG/50ML-% IV SOLN
20.0000 mg | Freq: Two times a day (BID) | INTRAVENOUS | Status: DC
  Administered 2019-07-23: 20 mg via INTRAVENOUS
  Filled 2019-07-23: qty 50

## 2019-07-23 MED ORDER — DIPHENHYDRAMINE HCL 50 MG/ML IJ SOLN
25.0000 mg | Freq: Once | INTRAMUSCULAR | Status: AC
  Administered 2019-07-23: 11:00:00 25 mg via INTRAVENOUS
  Filled 2019-07-23: qty 1

## 2019-07-23 MED ORDER — SODIUM CHLORIDE 0.9 % IV SOLN
10.0000 mg | Freq: Once | INTRAVENOUS | Status: AC
  Administered 2019-07-23: 10 mg via INTRAVENOUS
  Filled 2019-07-23: qty 10

## 2019-07-23 MED ORDER — ACETAMINOPHEN 325 MG PO TABS
650.0000 mg | ORAL_TABLET | Freq: Once | ORAL | Status: AC
  Administered 2019-07-23: 650 mg via ORAL
  Filled 2019-07-23: qty 2

## 2019-07-23 NOTE — Telephone Encounter (Signed)
Patient called stated that since her visit yesterday, she has been in a great amount of pain from the [facility] that was placed on her feet.  She said she has washed them off twice today and still in a lot of pain.  She is requesting percocet to be sent to her pharmacy.  Please advise

## 2019-07-23 NOTE — Progress Notes (Signed)
Patient states is in very bad pain in both feet. Went to doctor to have procedure done on feet. Pain is rated at level 10 and states she can not walk.

## 2019-07-24 NOTE — Progress Notes (Signed)
   Subjective: 64 y.o. female presents today status post permanent nail avulsion procedure of the lateral border of the left 2nd toe that was performed on 07/08/2019. She reports some intermittent sharp pain that is exacerbated by applying pressure to the area. She reports some discoloration. She is also here for follow up evaluation of porokeratosis of the bilateral feet. She reports some improvement. There are no modifying factors noted. Patient is here for further evaluation and treatment.    Past Medical History:  Diagnosis Date  . Hypertension     Objective: Skin is warm, dry and supple. Nail and respective nail fold appears to be healing appropriately. Open wound to the associated nail fold with a granular wound base and moderate amount of fibrotic tissue. Minimal drainage noted. Mild erythema around the periungual region likely due to phenol chemical matricectomy.  Assessment: #1 postop permanent partial nail avulsion lateral border left 2nd toe #2 open wound periungual nail fold of respective digit.  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 Antibiotic ointment and Band-Aid was applied. #3 Excisional debridement of keratotic lesion(s) using a chisel blade was performed without incident. Cantharone applied.  #4 Light dressing applied.  #5 Post op shoes dispensed bilaterally.  #6 patient is to return to clinic in 3 weeks.   Edrick Kins, DPM [facility]  Dr. Edrick Kins, [address]                                        [place], [place] [address]                Office [phone]  Fax [phone]

## 2019-07-26 ENCOUNTER — Other Ambulatory Visit: Payer: Self-pay | Admitting: Family Medicine

## 2019-07-26 DIAGNOSIS — I10 Essential (primary) hypertension: Secondary | ICD-10-CM

## 2019-07-28 NOTE — Telephone Encounter (Signed)
Dr Amalia Hailey,  Please advise

## 2019-07-28 NOTE — Telephone Encounter (Signed)
Patient does not need percocet for ingrown toenail procedure. Recommend ibuprofen 800 PRN. - Dr. Amalia Hailey

## 2019-07-30 DIAGNOSIS — F332 Major depressive disorder, recurrent severe without psychotic features: Secondary | ICD-10-CM | POA: Diagnosis not present

## 2019-07-30 DIAGNOSIS — R69 Illness, unspecified: Secondary | ICD-10-CM | POA: Diagnosis not present

## 2019-08-05 ENCOUNTER — Ambulatory Visit: Payer: Medicare HMO | Admitting: Podiatry

## 2019-08-05 ENCOUNTER — Other Ambulatory Visit: Payer: Self-pay

## 2019-08-05 DIAGNOSIS — L989 Disorder of the skin and subcutaneous tissue, unspecified: Secondary | ICD-10-CM

## 2019-08-05 NOTE — Patient Instructions (Signed)
Corn and Callus remover (active ingredient is Salicylic Acid)

## 2019-08-11 NOTE — Progress Notes (Signed)
   Subjective: 64 y.o. female presents today status post permanent nail avulsion procedure of the lateral border of the left 2nd toe that was performed on 07/08/2019. She states the toe is improving and she has only mild intermittent pain. She has been using the Gentamicin cream as directed. There are no worsening factors noted. Patient is here for further evaluation and treatment.    Past Medical History:  Diagnosis Date  . Hypertension     Objective: Skin is warm, dry and supple. Nail and respective nail fold appears to be healing appropriately. Open wound to the associated nail fold with a granular wound base and moderate amount of fibrotic tissue. Minimal drainage noted. Mild erythema around the periungual region likely due to phenol chemical matricectomy.  Assessment: #1 postop permanent partial nail avulsion lateral border left 2nd toe #2 open wound periungual nail fold of respective digit.  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 Filed with dremel without incident.  #3 Continue using Gentamicin cream daily.  #4 Excisional debridement of keratotic lesion(s) using a chisel blade was performed without incident. Light dressing applied.  #5 Recommended OTC corn and callus remover.  #6 Return to clinic in 6 weeks.    Edrick Kins, DPM [facility]  Dr. Edrick Kins, [address]                                        [place], [place] [address]                Office [phone]  Fax [phone]

## 2019-08-16 NOTE — Progress Notes (Signed)
[place]  [phone] [phone]  ID: Erica Bartlett OB: 07/25/1958  MR#: [id number]  YBW#:[id number]  Patient Care Team: Olin Hauser, DO as PCP - General (Family Medicine) Chucky May, MD as Consulting Physician (Psychiatry) Lloyd Huger, MD as Consulting Physician (Oncology) Greg Cutter, LCSW as Social Worker (Licensed Clinical Social Worker)  I connected with Erica Bartlett on 08/20/19 at  2:45 PM EDT by video enabled telemedicine visit and verified that I am speaking with the correct person using two identifiers.   I discussed the limitations, risks, security and privacy concerns of performing an evaluation and management service by telemedicine and the availability of in-person appointments. I also discussed with the patient that there may be a patient responsible charge related to this service. The patient expressed understanding and agreed to proceed.   Other persons participating in the visit and their role in the encounter: Patient, MD.  Patient's location: Home. Provider's location: Clinic.  CHIEF COMPLAINT: ITP  INTERVAL HISTORY: Patient agreed to video assisted telemedicine visit for further evaluation and discussion of her laboratory results.  Her foot pain is significantly improved, but not completely resolved.  She otherwise feels well.  She denies any easy bleeding or bruising.  She has no neurologic complaints. She continues to have chronic back pain. She denies any chest pain, shortness of breath, cough, or hemoptysis.  She has no nausea, vomiting, constipation, or diarrhea. She has no urinary complaints.  Patient offers no further specific complaints today.  REVIEW OF SYSTEMS:    Review of Systems  Constitutional: Negative for fever, malaise/fatigue and weight loss.  HENT: Negative.  Negative for congestion.   Respiratory: Negative.  Negative for cough and shortness of breath.     Cardiovascular: Negative.  Negative for chest pain and leg swelling.  Gastrointestinal: Negative.  Negative for blood in stool, melena and nausea.  Genitourinary: Negative.  Negative for hematuria.  Musculoskeletal: Positive for back pain. Negative for joint pain.  Skin: Negative.  Negative for rash.  Neurological: Negative.  Negative for sensory change, focal weakness, weakness and headaches.  Endo/Heme/Allergies: Negative.  Does not bruise/bleed easily.  Psychiatric/Behavioral: Negative.  Negative for depression. The patient is not nervous/anxious and does not have insomnia.     As per HPI. Otherwise, a complete review of systems is negative.  PAST MEDICAL HISTORY: Past Medical History:  Diagnosis Date  . Hypertension     PAST SURGICAL HISTORY: Past Surgical History:  Procedure Laterality Date  . ABDOMINAL HYSTERECTOMY    . BREAST BIOPSY Left    age 24 surgical bx neg  . CESAREAN SECTION    . cyst removed     breast, benign    FAMILY HISTORY: Reviewed and unchanged. No reported history of malignancy or chronic disease.     ADVANCED DIRECTIVES:    HEALTH MAINTENANCE: Social History   Tobacco Use  . Smoking status: Current Some Day Smoker    Packs/day: 0.15    Types: Cigarettes  . Smokeless tobacco: Never Used  . Tobacco comment: 1 pack takes 3.5-4 weeks to finish   Substance Use Topics  . Alcohol use: No  . Drug use: No    Allergies  Allergen Reactions  . Doxycycline Nausea And Vomiting  . Penicillins Hives    Current Outpatient Medications  Medication Sig Dispense Refill  . acetaminophen (TYLENOL) 500 MG tablet Take 500 mg by mouth every 6 (six) hours as needed.    Marland Kitchen amLODipine (NORVASC) 10  MG tablet TAKE 1 TABLET (10 MG TOTAL) BY MOUTH DAILY. **NEED APPT** 30 tablet 0  . cetirizine (ZYRTEC) 10 MG tablet Take 1 tablet (10 mg total) by mouth daily. 30 tablet 11  . citalopram (CELEXA) 20 MG tablet Take 20 mg by mouth as needed.     . clonazePAM  (KLONOPIN) 1 MG tablet Take 1-2 mg by mouth 3 (three) times daily as needed. Reported on 10/11/2015    . clotrimazole (LOTRIMIN) 1 % cream Apply 1 application topically 2 (two) times daily. For 2-4 weeks, use on affected area 30 g 0  . diphenhydrAMINE (BENADRYL) 25 mg capsule Take 25 mg by mouth as needed.    . fluticasone (FLONASE) 50 MCG/ACT nasal spray Place 2 sprays into both nostrils daily. 16 g 11  . hydrochlorothiazide (HYDRODIURIL) 25 MG tablet Take 1 tablet (25 mg total) by mouth daily. 30 tablet 0  . HYDROcodone-acetaminophen (NORCO/VICODIN) 5-325 MG tablet Take 1 tablet by mouth every 6 (six) hours as needed.    . methocarbamol (ROBAXIN) 500 MG tablet Take 1 tablet by mouth 4 (four) times daily.    . ondansetron (ZOFRAN) 4 MG tablet Take 1 tablet (4 mg total) by mouth every 8 (eight) hours as needed for nausea or vomiting. 20 tablet 1  . traMADol (ULTRAM) 50 MG tablet Take 50 mg by mouth every 6 (six) hours as needed.    . triamcinolone cream (KENALOG) 0.5 % Apply 1 application topically 2 (two) times daily. To affected areas, for up to 2 weeks. 30 g 1  . VENTOLIN HFA 108 (90 Base) MCG/ACT inhaler INHALE 1-2 PUFFS INTO THE LUNGS EVERY 6 (SIX) HOURS AS NEEDED FOR WHEEZING OR SHORTNESS OF BREATH. 18 g 0  . mupirocin ointment (BACTROBAN) 2 % Apply 1 application topically 2 (two) times daily. Apply 15-72mn after steroid cream has rubbed in. For up to 2 weeks 30 g 1   No current facility-administered medications for this visit.    OBJECTIVE: There were no vitals filed for this visit.   There is no height or weight on file to calculate BMI.    ECOG FS:0 - Asymptomatic  General: Well-developed, well-nourished, no acute distress. HEENT: Normocephalic. Neuro: Alert, answering all questions appropriately. Cranial nerves grossly intact. Psych: Normal affect.   LAB RESULTS:  Lab Results  Component Value Date   NA 138 08/17/2016   K 3.6 08/17/2016   CL 101 08/17/2016   CO2 25 08/17/2016    GLUCOSE 81 08/17/2016   BUN 11 08/17/2016   CREATININE 0.88 08/17/2016   CALCIUM 9.4 08/17/2016   PROT 7.3 08/17/2016   ALBUMIN 4.0 08/17/2016   AST 25 08/17/2016   ALT 30 (H) 08/17/2016   ALKPHOS 85 08/17/2016   BILITOT 0.3 08/17/2016   GFRNONAA 64 08/04/2015   GFRAA 74 08/04/2015    Lab Results  Component Value Date   WBC 6.9 08/18/2019   NEUTROABS 4.9 08/18/2019   HGB 14.8 08/18/2019   HCT 43.7 08/18/2019   MCV 85.9 08/18/2019   PLT 188 08/18/2019     HEMATOLOGY HISTORY: Bone marrow biopsy performed on July 28, 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 Patient responded to steroids, but the results were not durable.  Patient has received weekly Rituxan x4 with excellent results each time completing treatment on the following dates:  1.  Aug 30, 2015. 2.  November 23, 2016. 3.  December 20, 2017.   4.  July 23, 2019.  STUDIES: No  results found.  ASSESSMENT: Bone marrow biopsy proven ITP.  PLAN:    1. ITP: Patient platelet count has once again improved to within normal limits and is 188 today.  Of note, if patient requires Rituxan again, she will need increased premedications  since she has mild reaction with cycle 1.  No intervention is needed at this time.  Return to clinic in 3 months with repeat laboratory work and video assisted telemedicine visit. 2.  Leukocytosis: Resolved. 3.  Back pain: Chronic and unchanged. Continue evaluation and treatment per primary care. 4.  Recurrent congestion/cough: Resolved.  Patient has a normal IgG level of 800. 5.  Foot pain: Significantly improved.  Continue follow-up with podiatry as indicated. I provided 20 minutes of face-to-face video visit time during this encounter which included chart review, counseling, and coordination of care as documented above.   Patient expressed understanding and was in agreement with this plan. She also understands that She can call clinic at any time with any questions,  concerns, or complaints.   Lloyd Huger, MD   08/20/2019 8:44 AM

## 2019-08-18 ENCOUNTER — Inpatient Hospital Stay: Payer: Medicare HMO

## 2019-08-18 ENCOUNTER — Other Ambulatory Visit: Payer: Self-pay

## 2019-08-18 DIAGNOSIS — D693 Immune thrombocytopenic purpura: Secondary | ICD-10-CM

## 2019-08-18 DIAGNOSIS — Z79899 Other long term (current) drug therapy: Secondary | ICD-10-CM | POA: Diagnosis not present

## 2019-08-18 DIAGNOSIS — M79673 Pain in unspecified foot: Secondary | ICD-10-CM | POA: Insufficient documentation

## 2019-08-18 DIAGNOSIS — G8929 Other chronic pain: Secondary | ICD-10-CM | POA: Insufficient documentation

## 2019-08-18 DIAGNOSIS — M549 Dorsalgia, unspecified: Secondary | ICD-10-CM | POA: Insufficient documentation

## 2019-08-18 LAB — CBC WITH DIFFERENTIAL/PLATELET
Abs Immature Granulocytes: 0.04 10*3/uL (ref 0.00–0.07)
Basophils Absolute: 0.1 10*3/uL (ref 0.0–0.1)
Basophils Relative: 1 %
Eosinophils Absolute: 0.1 10*3/uL (ref 0.0–0.5)
Eosinophils Relative: 2 %
HCT: 43.7 % (ref 36.0–46.0)
Hemoglobin: 14.8 g/dL (ref 12.0–15.0)
Immature Granulocytes: 1 %
Lymphocytes Relative: 21 %
Lymphs Abs: 1.4 10*3/uL (ref 0.7–4.0)
MCH: 29.1 pg (ref 26.0–34.0)
MCHC: 33.9 g/dL (ref 30.0–36.0)
MCV: 85.9 fL (ref 80.0–100.0)
Monocytes Absolute: 0.3 10*3/uL (ref 0.1–1.0)
Monocytes Relative: 5 %
Neutro Abs: 4.9 10*3/uL (ref 1.7–7.7)
Neutrophils Relative %: 70 %
Platelets: 188 10*3/uL (ref 150–400)
RBC: 5.09 MIL/uL (ref 3.87–5.11)
RDW: 14.5 % (ref 11.5–15.5)
WBC: 6.9 10*3/uL (ref 4.0–10.5)
nRBC: 0 % (ref 0.0–0.2)

## 2019-08-19 ENCOUNTER — Inpatient Hospital Stay: Payer: Medicare HMO | Attending: Oncology | Admitting: Oncology

## 2019-08-19 ENCOUNTER — Encounter: Payer: Self-pay | Admitting: Oncology

## 2019-08-19 DIAGNOSIS — D693 Immune thrombocytopenic purpura: Secondary | ICD-10-CM | POA: Diagnosis not present

## 2019-08-19 NOTE — Progress Notes (Signed)
Pt prescreened for appt. Reports that she's been feeling tired and rundown recently. No other complaints or concerns.

## 2019-09-01 ENCOUNTER — Ambulatory Visit (INDEPENDENT_AMBULATORY_CARE_PROVIDER_SITE_OTHER): Payer: Medicare HMO | Admitting: Family Medicine

## 2019-09-01 ENCOUNTER — Other Ambulatory Visit: Payer: Self-pay

## 2019-09-01 ENCOUNTER — Encounter: Payer: Self-pay | Admitting: Family Medicine

## 2019-09-01 VITALS — BP 132/60 | HR 81 | Temp 97.3°F | Resp 16 | Ht 64.0 in | Wt 176.0 lb

## 2019-09-01 DIAGNOSIS — B354 Tinea corporis: Secondary | ICD-10-CM

## 2019-09-01 MED ORDER — CLOTRIMAZOLE-BETAMETHASONE 1-0.05 % EX CREA: TOPICAL_CREAM | Freq: Two times a day (BID) | CUTANEOUS | 1 refills | Status: DC

## 2019-09-01 NOTE — Patient Instructions (Addendum)
Thank you for coming to the office today.  Likely weakened immune system  Try the new rx topical steroid / anti fungal cream  The rash looks most consistent with eczema, this can flare up and get worse due to a variety of factors (excessive dry skin from bathing/showering, soaps, cold weather / indoor heaters, outdoor exposures).  Use the topical steroid creams twice a day for up to 1 week, maximum duration of use per one flare is 10 to 14 days, then STOP using it and allow skin to recover. Caution with over-use may cause lightening of the skin.   Hydrocortisone on face only and the Triamcinolone / Kenalog on body only.  Tips to reduce Eczema Flares: For baths/showers, limit bathing to every other day if you can (max 1 x daily)  Use a gentle, unscented soap and lukewarm water (hot water is most irritating to skin) Never scrub skin with too much pressure, this causes more irritation. Pat skin dry, then leave it slightly damp. DO NOT scrub it dry. Apply steroid cream to skin and rub in all the way, wait 15 min, then apply a daily moisturizer (Vaseline, Eucerin, Aveeno). Continue daily moisturizer every day of the year (even after flare is resolved)  If develops redness, honey colored crust oozing, drainage of pus, bleeding, or redness / swelling, pain, please return for re-evaluation, may have become infected after scratching.    Please schedule a Follow-up Appointment to: Return if symptoms worsen or fail to improve, for 2-4 weeks rash tinea.  If you have any other questions or concerns, please feel free to call the office or send a message through [place]. You may also schedule an earlier appointment if necessary.  Additionally, you may be receiving a survey about your experience at our office within a few days to 1 week by e-mail or mail. We value your feedback.  [name], DO [place]

## 2019-09-01 NOTE — Progress Notes (Signed)
Subjective:    Patient ID: Erica Bartlett, female    DOB: 06-08-1955, 64 y.o.   MRN: [id number]  Erica Bartlett is a 64 y.o. female presenting on 09/01/2019 for Tinea (as per patient onset 4 days)  Patient presents for a same day appointment.  HPI   Rash, forearms Reports new onset 4 days ago, R forearm inner aspect x 2 areas of circular dry patchy itchy rash, had new spot develop on left forearm. No sick contact or anyone with similar rash. No bleeding or ulceration or drainage. No fevers, spreading redness She just finished chemo recently   Depression screen [facility] 2/9 06/17/2019 03/28/2019 12/17/2018  Decreased Interest 0 0 0  Down, Depressed, Hopeless 0 0 1  PHQ - 2 Score 0 0 1  Altered sleeping - - -  Tired, decreased energy - - -  Change in appetite - - -  Feeling bad or failure about yourself  - - -  Trouble concentrating - - -  Moving slowly or fidgety/restless - - -  Suicidal thoughts - - -  PHQ-9 Score - - -  Difficult doing work/chores - - -    Social History   Tobacco Use  . Smoking status: Current Some Day Smoker    Packs/day: 0.15    Types: Cigarettes  . Smokeless tobacco: Never Used  . Tobacco comment: 1 pack takes 3.5-4 weeks to finish   Substance Use Topics  . Alcohol use: No  . Drug use: No    Review of Systems Per HPI unless specifically indicated above     Objective:    BP 132/60   Pulse 81   Temp (!) 97.3 F (36.3 C) (Temporal)   Resp 16   Ht 5\' 4"  (1.626 m)   Wt 176 lb (79.8 kg)   SpO2 99%   BMI 30.21 kg/m   Wt Readings from Last 3 Encounters:  09/01/19 176 lb (79.8 kg)  07/23/19 170 lb (77.1 kg)  07/16/19 171 lb 1.6 oz (77.6 kg)    Physical Exam Vitals and nursing note reviewed.  Constitutional:      General: She is not in acute distress.    Appearance: She is well-developed. She is not diaphoretic.     Comments: Well-appearing, comfortable, cooperative  HENT:     Head: Normocephalic and atraumatic.  Eyes:   General:        Right eye: No discharge.        Left eye: No discharge.     Conjunctiva/sclera: Conjunctivae normal.  Cardiovascular:     Rate and Rhythm: Normal rate.  Pulmonary:     Effort: Pulmonary effort is normal.  Skin:    General: Skin is warm and dry.     Findings: Rash (R inner forearm annular rash dry flaking irritated skin 2 distinct areas, left forearm area of possible pustule or blister very small lesion, not similar) present. No erythema.  Neurological:     Mental Status: She is alert and oriented to person, place, and time.  Psychiatric:        Behavior: Behavior normal.     Comments: Well groomed, good eye contact, normal speech and thoughts    Results for orders placed or performed in visit on 08/18/19  CBC with Differential/Platelet  Result Value Ref Range   WBC 6.9 4.0 - 10.5 K/uL   RBC 5.09 3.87 - 5.11 MIL/uL   Hemoglobin 14.8 12.0 - 15.0 g/dL   HCT 43.7 36.0 - 46.0 %  MCV 85.9 80.0 - 100.0 fL   MCH 29.1 26.0 - 34.0 pg   MCHC 33.9 30.0 - 36.0 g/dL   RDW 14.5 11.5 - 15.5 %   Platelets 188 150 - 400 K/uL   nRBC 0.0 0.0 - 0.2 %   Neutrophils Relative % 70 %   Neutro Abs 4.9 1.7 - 7.7 K/uL   Lymphocytes Relative 21 %   Lymphs Abs 1.4 0.7 - 4.0 K/uL   Monocytes Relative 5 %   Monocytes Absolute 0.3 0.1 - 1.0 K/uL   Eosinophils Relative 2 %   Eosinophils Absolute 0.1 0.0 - 0.5 K/uL   Basophils Relative 1 %   Basophils Absolute 0.1 0.0 - 0.1 K/uL   Immature Granulocytes 1 %   Abs Immature Granulocytes 0.04 0.00 - 0.07 K/uL      Assessment & Plan:   Problem List Items Addressed This Visit    None    Visit Diagnoses    Tinea corporis    -  Primary   Relevant Medications   clotrimazole-betamethasone (LOTRISONE) cream      Localized area clinically appears tinea rash on forearm Some localized itching No sign secondary infection May be from reduced immunity on chemotherapy  Trial dual anti fungal / steroid topical treatment 1-2 weeks then  PRN Can adjust accordingly Follow-up if not improve  Meds ordered this encounter  Medications  . clotrimazole-betamethasone (LOTRISONE) cream    Sig: Apply topically 2 (two) times daily. For 2 weeks, can adjust dose or med if not improved    Dispense:  30 g    Refill:  1      Follow up plan: Return if symptoms worsen or fail to improve, for 2-4 weeks rash tinea.   Erica Bartlett, [facility] 09/01/2019, 4:00 PM

## 2019-09-02 ENCOUNTER — Other Ambulatory Visit: Payer: Self-pay | Admitting: Family Medicine

## 2019-09-02 DIAGNOSIS — I10 Essential (primary) hypertension: Secondary | ICD-10-CM

## 2019-09-16 ENCOUNTER — Other Ambulatory Visit: Payer: Self-pay

## 2019-09-16 ENCOUNTER — Ambulatory Visit: Payer: Medicare HMO | Admitting: Podiatry

## 2019-09-16 DIAGNOSIS — L989 Disorder of the skin and subcutaneous tissue, unspecified: Secondary | ICD-10-CM

## 2019-09-18 NOTE — Progress Notes (Signed)
   Subjective: 64 y.o. female presenting to the office today for follow up evaluation of bilateral foot pain. She reports significant improvement but notes the callus lesions are still present. She has been using OTC corn and callus removers with some relief. Walking and bearing weight for long periods of time increases her pain. Patient is here for further evaluation and treatment.   Past Medical History:  Diagnosis Date  . Hypertension      Objective:  Physical Exam General: Alert and oriented x3 in no acute distress  Dermatology: Hyperkeratotic lesion(s) present on the bilateral feet. Pain on palpation with a central nucleated core noted. Skin is warm, dry and supple bilateral lower extremities. Negative for open lesions or macerations.  Vascular: Palpable pedal pulses bilaterally. No edema or erythema noted. Capillary refill within normal limits.  Neurological: Epicritic and protective threshold grossly intact bilaterally.   Musculoskeletal Exam: Pain on palpation at the keratotic lesion(s) noted. Range of motion within normal limits bilateral. Muscle strength 5/5 in all groups bilateral.  Assessment: 1. 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 Patient evaluated 2. Excisional debridement of keratoic lesion(s) using a chisel blade was performed without incident. Salinocaine applied.  3. Dressed area with light dressing. 4. Continue using OTC corn and callus remover.  5. Patient is to return to the clinic in 6 weeks.   Edrick Kins, DPM [facility]  Dr. Edrick Kins, [address]                                        [place], [place] [address]                Office [phone]  Fax [phone]

## 2019-09-22 ENCOUNTER — Other Ambulatory Visit: Payer: Self-pay | Admitting: Family Medicine

## 2019-09-22 DIAGNOSIS — I10 Essential (primary) hypertension: Secondary | ICD-10-CM

## 2019-09-23 NOTE — Telephone Encounter (Signed)
Requested medication (s) are due for refill today -yes  Requested medication (s) are on the active medication list -yes  Future visit scheduled -no  Last refill: 06/27/19  Notes to clinic: Patient fails lab protocol- she has been in office for acute visit- but not medication follow up- courtesy RF given last RF- sent for review of request  Requested Prescriptions  Pending Prescriptions Disp Refills   hydrochlorothiazide (HYDRODIURIL) 25 MG tablet [Pharmacy Med Name: HYDROCHLOROTHIAZIDE 25 MG TAB] 90 tablet     Sig: TAKE 1 TABLET BY MOUTH EVERY DAY      Cardiovascular: Diuretics - Thiazide Failed - 09/22/2019 10:32 AM      Failed - Ca in normal range and within 360 days    Calcium  Date Value Ref Range Status  08/17/2016 9.4 8.6 - 10.4 mg/dL Final          Failed - Cr in normal range and within 360 days    Creat  Date Value Ref Range Status  08/17/2016 0.88 0.50 - 1.05 mg/dL Final    Comment:      For patients > or = 62 years of age: The upper reference limit for Creatinine is approximately 13% higher for people identified as African-American.             Failed - K in normal range and within 360 days    Potassium  Date Value Ref Range Status  08/17/2016 3.6 3.5 - 5.3 mmol/L Final          Failed - Na in normal range and within 360 days    Sodium  Date Value Ref Range Status  08/17/2016 138 135 - 146 mmol/L Final  08/04/2015 140 134 - 144 mmol/L Final          Passed - Last BP in normal range    BP Readings from Last 1 Encounters:  09/01/19 132/60          Passed - Valid encounter within last 6 months    Recent Outpatient Visits           3 weeks ago Tinea corporis   [place], DO   3 months ago Paronychia of toe of left foot due to ingrown toenail   [place], DO   4 months ago Chronic maxillary sinusitis   [facility], Devonne Doughty, DO   5  months ago Chronic maxillary sinusitis   [facility] O[place] Hauser, DO   10 months ago Chronic maxillary sinusitis   [place], DO       Future Appointments             In 1 month [place], Kathlene [place], MD [facility]                Requested Prescriptions  Pending Prescriptions Disp Refills   hydrochlorothiazide (HYDRODIURIL) 25 MG tablet [Pharmacy Med Name: HYDROCHLOROTHIAZIDE 25 MG TAB] 90 tablet     Sig: TAKE 1 TABLET BY MOUTH EVERY DAY      Cardiovascular: Diuretics - Thiazide Failed - 09/22/2019 10:32 AM      Failed - Ca in normal range and within 360 days    Calcium  Date Value Ref Range Status  08/17/2016 9.4 8.6 - 10.4 mg/dL Final          Failed - Cr in normal range and  within 360 days    Creat  Date Value Ref Range Status  08/17/2016 0.88 0.50 - 1.05 mg/dL Final    Comment:      For patients > or = 62 years of age: The upper reference limit for Creatinine is approximately 13% higher for people identified as African-American.             Failed - K in normal range and within 360 days    Potassium  Date Value Ref Range Status  08/17/2016 3.6 3.5 - 5.3 mmol/L Final          Failed - Na in normal range and within 360 days    Sodium  Date Value Ref Range Status  08/17/2016 138 135 - 146 mmol/L Final  08/04/2015 140 134 - 144 mmol/L Final          Passed - Last BP in normal range    BP Readings from Last 1 Encounters:  09/01/19 132/60          Passed - Valid encounter within last 6 months    Recent Outpatient Visits           3 weeks ago Tinea corporis   [place], DO   3 months ago Paronychia of toe of left foot due to ingrown toenail   [place], DO   4 months ago Chronic maxillary sinusitis   [place],  DO   5 months ago Chronic maxillary sinusitis   [place], DO   10 months ago Chronic maxillary sinusitis   [place], DO       Future Appointments             In 1 month [place] Ormond, Kathlene November, MD [facility]

## 2019-09-30 DIAGNOSIS — H524 Presbyopia: Secondary | ICD-10-CM | POA: Diagnosis not present

## 2019-10-26 ENCOUNTER — Other Ambulatory Visit: Payer: Self-pay | Admitting: Family Medicine

## 2019-10-26 DIAGNOSIS — I10 Essential (primary) hypertension: Secondary | ICD-10-CM

## 2019-10-26 NOTE — Telephone Encounter (Signed)
Requested medication (s) are due for refill today: yes  Requested medication (s) are on the active medication list: yes  Last refill:  09/23/19  Future visit scheduled: yes   Notes to clinic:  pt stated she has enough med to last to appt.   Requested Prescriptions  Pending Prescriptions Disp Refills   hydrochlorothiazide (HYDRODIURIL) 25 MG tablet [Pharmacy Med Name: HYDROCHLOROTHIAZIDE 25 MG TAB] 30 tablet 0    Sig: TAKE 1 TABLET BY MOUTH EVERY DAY      Cardiovascular: Diuretics - Thiazide Failed - 10/26/2019 12:30 PM      Failed - Ca in normal range and within 360 days    Calcium  Date Value Ref Range Status  08/17/2016 9.4 8.6 - 10.4 mg/dL Final          Failed - Cr in normal range and within 360 days    Creat  Date Value Ref Range Status  08/17/2016 0.88 0.50 - 1.05 mg/dL Final    Comment:      For patients > or = 62 years of age: The upper reference limit for Creatinine is approximately 13% higher for people identified as African-American.             Failed - K in normal range and within 360 days    Potassium  Date Value Ref Range Status  08/17/2016 3.6 3.5 - 5.3 mmol/L Final          Failed - Na in normal range and within 360 days    Sodium  Date Value Ref Range Status  08/17/2016 138 135 - 146 mmol/L Final  08/04/2015 140 134 - 144 mmol/L Final          Passed - Last BP in normal range    BP Readings from Last 1 Encounters:  09/01/19 132/60          Passed - Valid encounter within last 6 months    Recent Outpatient Visits           1 month ago Tinea corporis   [place], DO   4 months ago Paronychia of toe of left foot due to ingrown toenail   [place], DO   5 months ago Chronic maxillary sinusitis   [place], DO   7 months ago Chronic maxillary sinusitis   [place], DO    11 months ago Chronic maxillary sinusitis   [place], DO       Future Appointments             In 2 days [place], Devonne Doughty, [facility], [place]   In 3 weeks [place], MD [facility]

## 2019-10-28 ENCOUNTER — Other Ambulatory Visit: Payer: Self-pay

## 2019-10-28 ENCOUNTER — Ambulatory Visit: Payer: Medicare HMO | Admitting: Podiatry

## 2019-10-28 ENCOUNTER — Encounter: Payer: Self-pay | Admitting: Family Medicine

## 2019-10-28 ENCOUNTER — Telehealth (INDEPENDENT_AMBULATORY_CARE_PROVIDER_SITE_OTHER): Payer: Medicare HMO | Admitting: Family Medicine

## 2019-10-28 DIAGNOSIS — I10 Essential (primary) hypertension: Secondary | ICD-10-CM

## 2019-10-28 DIAGNOSIS — L989 Disorder of the skin and subcutaneous tissue, unspecified: Secondary | ICD-10-CM

## 2019-10-28 DIAGNOSIS — J0110 Acute frontal sinusitis, unspecified: Secondary | ICD-10-CM

## 2019-10-28 DIAGNOSIS — M79672 Pain in left foot: Secondary | ICD-10-CM

## 2019-10-28 DIAGNOSIS — M79671 Pain in right foot: Secondary | ICD-10-CM

## 2019-10-28 MED ORDER — BETAMETHASONE DIPROPIONATE 0.05 % EX CREA: TOPICAL_CREAM | Freq: Two times a day (BID) | CUTANEOUS | 0 refills | Status: DC

## 2019-10-28 MED ORDER — AMOXICILLIN-POT CLAVULANATE 875-125 MG PO TABS: 1.0000 | ORAL_TABLET | Freq: Two times a day (BID) | ORAL | 0 refills | Status: DC

## 2019-10-28 MED ORDER — PREDNISONE 50 MG PO TABS: 50.0000 mg | ORAL_TABLET | Freq: Every day | ORAL | 0 refills | Status: DC

## 2019-10-28 MED ORDER — AMLODIPINE BESYLATE 10 MG PO TABS: 10.0000 mg | ORAL_TABLET | Freq: Every day | ORAL | 3 refills | Status: DC

## 2019-10-28 MED ORDER — HYDROCHLOROTHIAZIDE 25 MG PO TABS: 25.0000 mg | ORAL_TABLET | Freq: Every day | ORAL | 3 refills | Status: DC

## 2019-10-28 NOTE — Progress Notes (Signed)
Subjective:    Patient ID: Erica Bartlett, female    DOB: [birth year], 64 y.o.   MRN: [id number]  Erica Bartlett is a 64 y.o. female presenting on 10/28/2019 for Sinusitis (nasal drainge from week --HA but denies chills --has low grade fever Friday but gone now)  Virtual / Telehealth Encounter - Video Visit via MyChart The purpose of this virtual visit is to provide medical care while limiting exposure to the novel coronavirus (COVID19) for both patient and office staff.  Consent was obtained for remote visit:  Yes.   Answered questions that patient had about telehealth interaction:  Yes.   I discussed the limitations, risks, security and privacy concerns of performing an evaluation and management service by video/telephone. I also discussed with the patient that there may be a patient responsible charge related to this service. The patient expressed understanding and agreed to proceed.  Patient Location: Home Provider Location: [facility] (Office)    HPI   RecurrentSINUSITIS Last visit with me for same issue 04/2019 ,treated with levaquin in the past however has benefited mostly from augmentin, prednison, she does have known history of chronic vs recurrent sinusitis. Previously followed by ENT. Had done better fewer sinus infection Describes today new onset developing similar sinus infection with symptoms facial pressure under eyes, headaches, nasal sinus drainage now persistent over past week No sick contacts or concern for COVID19. Recent COVID19 test negative - Tried OTCFlonase, Mucinex, robitussin - History of Traditional PCN only can cause hives. But tolerates Augmentin well  CHRONIC HTN: Reports home BP readings improved Needs refill Current Meds - HCTZ 25mg  daily, Amlodipine 10mg  daily   Reports good compliance, took meds today. Tolerating well, w/o complaints. Stressors can raise BP Denies CP, dyspnea, HA, edema, dizziness /  lightheadedness   Depression screen [facility] 2/9 06/17/2019 03/28/2019 12/17/2018  Decreased Interest 0 0 0  Down, Depressed, Hopeless 0 0 1  PHQ - 2 Score 0 0 1  Altered sleeping - - -  Tired, decreased energy - - -  Change in appetite - - -  Feeling bad or failure about yourself  - - -  Trouble concentrating - - -  Moving slowly or fidgety/restless - - -  Suicidal thoughts - - -  PHQ-9 Score - - -  Difficult doing work/chores - - -    Social History   Tobacco Use  . Smoking status: Current Some Day Smoker    Packs/day: 0.15    Types: Cigarettes  . Smokeless tobacco: Never Used  . Tobacco comment: 1 pack takes 3.5-4 weeks to finish   Vaping Use  . Vaping Use: Never used  Substance Use Topics  . Alcohol use: No  . Drug use: No    Review of Systems Per HPI unless specifically indicated above     Objective:    There were no vitals taken for this visit.  Wt Readings from Last 3 Encounters:  09/01/19 176 lb (79.8 kg)  07/23/19 170 lb (77.1 kg)  07/16/19 171 lb 1.6 oz (77.6 kg)    Physical Exam   Note examination was completely remotely via video observation objective data only  Gen - well-appearing, no acute distress or apparent pain, comfortable HEENT - eyes appear clear without discharge or redness Heart/Lungs - cannot examine virtually - observed no evidence of coughing or labored breathing. Skin - face visible today- no rash Neuro - awake, alert, oriented Psych - not anxious appearing   Results for orders  placed or performed in visit on 08/18/19  CBC with Differential/Platelet  Result Value Ref Range   WBC 6.9 4.0 - 10.5 K/uL   RBC 5.09 3.87 - 5.11 MIL/uL   Hemoglobin 14.8 12.0 - 15.0 g/dL   HCT 43.7 36 - 46 %   MCV 85.9 80.0 - 100.0 fL   MCH 29.1 26.0 - 34.0 pg   MCHC 33.9 30.0 - 36.0 g/dL   RDW 14.5 11.5 - 15.5 %   Platelets 188 150 - 400 K/uL   nRBC 0.0 0.0 - 0.2 %   Neutrophils Relative % 70 %   Neutro Abs 4.9 1.7 - 7.7 K/uL   Lymphocytes Relative  21 %   Lymphs Abs 1.4 0.7 - 4.0 K/uL   Monocytes Relative 5 %   Monocytes Absolute 0.3 0 - 1 K/uL   Eosinophils Relative 2 %   Eosinophils Absolute 0.1 0 - 0 K/uL   Basophils Relative 1 %   Basophils Absolute 0.1 0 - 0 K/uL   Immature Granulocytes 1 %   Abs Immature Granulocytes 0.04 0.00 - 0.07 K/uL      Assessment & Plan:   Problem List Items Addressed This Visit    Hypertension - Primary    Controlled HTN - Home BP readings reviewed Attributed to life stressors - multiple    Plan:  1. Continue current BP regimen - HCTZ 25mg  daily, Amlodipine 10mg  daily 2. Encourage improved lifestyle - low sodium diet, regular exercise 3. Continue monitor BP outside office, bring readings to next visit, if persistently >140/90 or new symptoms notify office sooner      Relevant Medications   amLODipine (NORVASC) 10 MG tablet   hydrochlorothiazide (HYDRODIURIL) 25 MG tablet    Other Visit Diagnoses    Acute non-recurrent frontal sinusitis       Relevant Medications   amoxicillin-clavulanate (AUGMENTIN) 875-125 MG tablet   predniSONE (DELTASONE) 50 MG tablet       Suspected Acute on chronic recurrentSinusitis - now concern with progression of symptoms similar to previous Sinusitis, has had worsening issue if prolonged infection, given her history opted to treat her earlier. - Last sinusitis w/ antibiotics >6 months ago - Reassuring without high risk symptoms - Afebrile, without dyspnea - has some immune deficiency  1. Augmentin BID x 10 days and Prednisone 50mg  daily x 5 day 2. Use other OTC sinus medicines as she is 3. Future still reconsider ENT if able to get re-established  Meds ordered this encounter  Medications  . amLODipine (NORVASC) 10 MG tablet    Sig: Take 1 tablet (10 mg total) by mouth daily.    Dispense:  90 tablet    Refill:  3  . hydrochlorothiazide (HYDRODIURIL) 25 MG tablet    Sig: Take 1 tablet (25 mg total) by mouth daily.    Dispense:  90 tablet     Refill:  3  . amoxicillin-clavulanate (AUGMENTIN) 875-125 MG tablet    Sig: Take 1 tablet by mouth 2 (two) times daily. For 10 days    Dispense:  20 tablet    Refill:  0    She has tolerated Augmentin fine without allergic reaction in the past. Only has some hives with traditional PCN by her report years ago. Question accuracy of the allergy.  . predniSONE (DELTASONE) 50 MG tablet    Sig: Take 1 tablet (50 mg total) by mouth daily with breakfast.    Dispense:  5 tablet    Refill:  0  Follow up plan: Return in about 6 months (around 04/29/2020), or if symptoms worsen or fail to improve.   Patient verbalizes understanding with the above medical recommendations including the limitation of remote medical advice.  Specific follow-up and call-back criteria were given for patient to follow-up or seek medical care more urgently if needed.  Total duration of direct patient care provided via video conference: 10 minutes   [name], [facility] 10/28/2019, 11:32 AM

## 2019-10-28 NOTE — Assessment & Plan Note (Signed)
Controlled HTN - Home BP readings reviewed Attributed to life stressors - multiple    Plan:  1. Continue current BP regimen - HCTZ 25mg  daily, Amlodipine 10mg  daily 2. Encourage improved lifestyle - low sodium diet, regular exercise 3. Continue monitor BP outside office, bring readings to next visit, if persistently >140/90 or new symptoms notify office sooner

## 2019-10-28 NOTE — Patient Instructions (Addendum)
Start Augmentin for 10 days, add Prednisone for first 5 days  Re ordered BP medications - HCTZ and Amlodipine  Please schedule a Follow-up Appointment to: Return in about 6 months (around 04/29/2020), or if symptoms worsen or fail to improve.  If you have any other questions or concerns, please feel free to call the office or send a message through [place]. You may also schedule an earlier appointment if necessary.  Additionally, you may be receiving a survey about your experience at our office within a few days to 1 week by e-mail or mail. We value your feedback.  [name], DO [place]

## 2019-10-28 NOTE — Progress Notes (Signed)
   Subjective: 64 y.o. female presenting to the office today for follow up evaluation of bilateral foot pain. She reports significant improvement but notes the callus lesions are still present. She has been using OTC corn and callus removers with some relief. Walking and bearing weight for long periods of time increases her pain. Patient is here for further evaluation and treatment.   Past Medical History:  Diagnosis Date  . Hypertension      Objective:  Physical Exam General: Alert and oriented x3 in no acute distress  Dermatology: Hyperkeratotic lesion(s) present on the bilateral feet. Pain on palpation with a central nucleated core noted. Skin is warm, dry and supple bilateral lower extremities. Negative for open lesions or macerations.  Vascular: Palpable pedal pulses bilaterally. No edema or erythema noted. Capillary refill within normal limits.  Neurological: Epicritic and protective threshold grossly intact bilaterally.   Musculoskeletal Exam: Pain on palpation at the keratotic lesion(s) noted. Range of motion within normal limits bilateral. Muscle strength 5/5 in all groups bilateral.  Assessment: 1. 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 Patient evaluated 2. Excisional debridement of keratoic lesion(s) using a chisel blade was performed without incident. Salinocaine applied.  3. Dressed area with light dressing. 4. Continue using OTC corn and callus remover. Salinocaine provided 5.  Prescription for betamethasone 0.05% cream to apply 2 times daily in combination with the corn callus remover  6.  Patient is to return to the clinic in 6 weeks.   Edrick Kins, DPM [facility]  Dr. Edrick Kins, [address]                                        [place], [place] [address]                Office [phone]  Fax [phone]

## 2019-11-15 NOTE — Progress Notes (Signed)
  [facility]  [phone] [phone]) [phone]  ID: Erica Bartlett OB: [birth year]  MR#: [id number]  KVQ#:[id number]  Patient Care Team: Olin Hauser, DO as PCP - General (Family Medicine) Chucky May, MD as Consulting Physician (Psychiatry) Lloyd Huger, MD as Consulting Physician (Oncology) Greg Cutter, LCSW as Social Worker (Licensed Clinical Social Worker)    Lloyd Huger, MD   11/20/2019 6:39 AM     This encounter was created in error - please disregard.

## 2019-11-17 ENCOUNTER — Other Ambulatory Visit: Payer: Self-pay

## 2019-11-17 ENCOUNTER — Inpatient Hospital Stay: Payer: Medicare HMO | Attending: Oncology

## 2019-11-17 DIAGNOSIS — D693 Immune thrombocytopenic purpura: Secondary | ICD-10-CM | POA: Diagnosis not present

## 2019-11-17 LAB — CBC WITH DIFFERENTIAL/PLATELET
Abs Immature Granulocytes: 0.03 10*3/uL (ref 0.00–0.07)
Basophils Absolute: 0 10*3/uL (ref 0.0–0.1)
Basophils Relative: 1 %
Eosinophils Absolute: 0.1 10*3/uL (ref 0.0–0.5)
Eosinophils Relative: 2 %
HCT: 42.6 % (ref 36.0–46.0)
Hemoglobin: 14.6 g/dL (ref 12.0–15.0)
Immature Granulocytes: 1 %
Lymphocytes Relative: 24 %
Lymphs Abs: 1.6 10*3/uL (ref 0.7–4.0)
MCH: 29 pg (ref 26.0–34.0)
MCHC: 34.3 g/dL (ref 30.0–36.0)
MCV: 84.5 fL (ref 80.0–100.0)
Monocytes Absolute: 0.3 10*3/uL (ref 0.1–1.0)
Monocytes Relative: 5 %
Neutro Abs: 4.5 10*3/uL (ref 1.7–7.7)
Neutrophils Relative %: 67 %
Platelets: 173 10*3/uL (ref 150–400)
RBC: 5.04 MIL/uL (ref 3.87–5.11)
RDW: 14.5 % (ref 11.5–15.5)
WBC: 6.6 10*3/uL (ref 4.0–10.5)
nRBC: 0 % (ref 0.0–0.2)

## 2019-11-18 ENCOUNTER — Inpatient Hospital Stay: Payer: Medicare HMO | Admitting: Oncology

## 2019-11-18 DIAGNOSIS — D693 Immune thrombocytopenic purpura: Secondary | ICD-10-CM

## 2019-11-23 NOTE — Progress Notes (Signed)
[facility]  [phone] [phone]  ID: Erica Bartlett OB: 06/22/1955  MR#: [id number]  CSN#:[id number]  Patient Care Team: Karamalegos, Alexander J, DO as PCP - General (Family Medicine) Kaur, Rupinder, MD as Consulting Physician (Psychiatry) Finnegan, Timothy J, MD as Consulting Physician (Oncology) Joyce, Brooke L, LCSW as Social Worker (Licensed Clinical Social Worker)  I connected with Erica Bartlett on 11/27/19 at  3:00 PM EDT by video enabled telemedicine visit and verified that I am speaking with the correct person using two identifiers.   I discussed the limitations, risks, security and privacy concerns of performing an evaluation and management service by telemedicine and the availability of in-person appointments. I also discussed with the patient that there may be a patient responsible charge related to this service. The patient expressed understanding and agreed to proceed.   Other persons participating in the visit and their role in the encounter: Patient, MD.  Patient's location: Home. Provider's location: Clinic.  CHIEF COMPLAINT: ITP  INTERVAL HISTORY: Patient agreed to video assisted telemedicine visit for further evaluation and discussion of her laboratory results.  She continues to have foot pain that is actively being addressed by podiatry.  She has chronic back pain, but otherwise feels well.  She denies any easy bleeding or bruising.  She has no neurologic complaints. She denies any chest pain, shortness of breath, cough, or hemoptysis.  She has no nausea, vomiting, constipation, or diarrhea. She has no urinary complaints.  Patient offers no further specific complaints today.  REVIEW OF SYSTEMS:    Review of Systems  Constitutional: Negative for fever, malaise/fatigue and weight loss.  HENT: Negative.  Negative for congestion.   Respiratory: Negative.  Negative for cough and shortness of breath.   Cardiovascular:  Negative.  Negative for chest pain and leg swelling.  Gastrointestinal: Negative.  Negative for blood in stool, melena and nausea.  Genitourinary: Negative.  Negative for hematuria.  Musculoskeletal: Positive for back pain. Negative for joint pain.  Skin: Negative.  Negative for rash.  Neurological: Negative.  Negative for sensory change, focal weakness, weakness and headaches.  Endo/Heme/Allergies: Negative.  Does not bruise/bleed easily.  Psychiatric/Behavioral: Negative.  Negative for depression. The patient is not nervous/anxious and does not have insomnia.     As per HPI. Otherwise, a complete review of systems is negative.  PAST MEDICAL HISTORY: Past Medical History:  Diagnosis Date  . Hypertension     PAST SURGICAL HISTORY: Past Surgical History:  Procedure Laterality Date  . ABDOMINAL HYSTERECTOMY    . BREAST BIOPSY Left    age 19 surgical bx neg  . CESAREAN SECTION    . cyst removed     breast, benign    FAMILY HISTORY: Reviewed and unchanged. No reported history of malignancy or chronic disease.     ADVANCED DIRECTIVES:    HEALTH MAINTENANCE: Social History   Tobacco Use  . Smoking status: Current Some Day Smoker    Packs/day: 0.15    Types: Cigarettes  . Smokeless tobacco: Never Used  . Tobacco comment: 1 pack takes 3.5-4 weeks to finish   Vaping Use  . Vaping Use: Never used  Substance Use Topics  . Alcohol use: No  . Drug use: No    Allergies  Allergen Reactions  . Doxycycline Nausea And Vomiting  . Penicillins Hives    Current Outpatient Medications  Medication Sig Dispense Refill  . acetaminophen (TYLENOL) 500 MG tablet Take 500 mg by mouth every 6 (six) hours   as needed.    Marland Kitchen amLODipine (NORVASC) 10 MG tablet Take 1 tablet (10 mg total) by mouth daily. 90 tablet 3  . cetirizine (ZYRTEC) 10 MG tablet Take 1 tablet (10 mg total) by mouth daily. 30 tablet 11  . citalopram (CELEXA) 20 MG tablet Take 20 mg by mouth as needed.     . clonazePAM  (KLONOPIN) 1 MG tablet Take 1-2 mg by mouth 3 (three) times daily as needed. Reported on 10/11/2015    . diphenhydrAMINE (BENADRYL) 25 mg capsule Take 25 mg by mouth as needed.    . fluticasone (FLONASE) 50 MCG/ACT nasal spray Place 2 sprays into both nostrils daily. 16 g 11  . hydrochlorothiazide (HYDRODIURIL) 25 MG tablet Take 1 tablet (25 mg total) by mouth daily. 90 tablet 3  . HYDROcodone-acetaminophen (NORCO/VICODIN) 5-325 MG tablet Take 1 tablet by mouth every 6 (six) hours as needed.    . ondansetron (ZOFRAN) 4 MG tablet Take 1 tablet (4 mg total) by mouth every 8 (eight) hours as needed for nausea or vomiting. 20 tablet 1  . traMADol (ULTRAM) 50 MG tablet Take 50 mg by mouth every 6 (six) hours as needed.    . VENTOLIN HFA 108 (90 Base) MCG/ACT inhaler INHALE 1-2 PUFFS INTO THE LUNGS EVERY 6 (SIX) HOURS AS NEEDED FOR WHEEZING OR SHORTNESS OF BREATH. 18 g 0   No current facility-administered medications for this visit.    OBJECTIVE: There were no vitals filed for this visit.   There is no height or weight on file to calculate BMI.    ECOG FS:0 - Asymptomatic  General: Well-developed, well-nourished, no acute distress. HEENT: Normocephalic. Neuro: Alert, answering all questions appropriately. Cranial nerves grossly intact. Psych: Normal affect.  LAB RESULTS:  Lab Results  Component Value Date   NA 138 08/17/2016   K 3.6 08/17/2016   CL 101 08/17/2016   CO2 25 08/17/2016   GLUCOSE 81 08/17/2016   BUN 11 08/17/2016   CREATININE 0.88 08/17/2016   CALCIUM 9.4 08/17/2016   PROT 7.3 08/17/2016   ALBUMIN 4.0 08/17/2016   AST 25 08/17/2016   ALT 30 (H) 08/17/2016   ALKPHOS 85 08/17/2016   BILITOT 0.3 08/17/2016   GFRNONAA 64 08/04/2015   GFRAA 74 08/04/2015    Lab Results  Component Value Date   WBC 6.6 11/17/2019   NEUTROABS 4.5 11/17/2019   HGB 14.6 11/17/2019   HCT 42.6 11/17/2019   MCV 84.5 11/17/2019   PLT 173 11/17/2019     HEMATOLOGY HISTORY: 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 Patient responded to steroids, but the results were not durable.  Patient has received weekly Rituxan x4 with excellent results each time completing treatment on the following dates:  1.  Aug 30, 2015. 2.  November 23, 2016. 3.  December 20, 2017.   4.  July 23, 2019.  STUDIES: No results found.  ASSESSMENT: Bone marrow biopsy proven ITP.  PLAN:    1. ITP: Patient's platelet count remains within normal limits after her last round of Rituxan in March 2021.  Of note, if patient requires Rituxan again, she will need increased premedications  since she has mild reaction with cycle 1.  No intervention is needed at this time.  Return to clinic in 3 months for laboratory work only and then in 6 months for laboratory work and video assisted telemedicine visit.   2.  Leukocytosis: Resolved. 3.  Back pain: Chronic and unchanged. Continue evaluation and treatment per primary care. 4.  Recurrent congestion/cough: Resolved.  Patient has a normal IgG level of 800. 5.  Foot pain: Significantly improved.  Continue follow-up with podiatry as indicated.  I provided 20 minutes of face-to-face video visit time during this encounter which included chart review, counseling, and coordination of care as documented above.   Patient expressed understanding and was in agreement with this plan. She also understands that She can call clinic at any time with any questions, concerns, or complaints.   Timothy J Finnegan, MD   11/27/2019 3:46 PM     

## 2019-11-26 DIAGNOSIS — R519 Headache, unspecified: Secondary | ICD-10-CM | POA: Diagnosis not present

## 2019-11-26 DIAGNOSIS — Z20828 Contact with and (suspected) exposure to other viral communicable diseases: Secondary | ICD-10-CM | POA: Diagnosis not present

## 2019-11-26 DIAGNOSIS — Z1159 Encounter for screening for other viral diseases: Secondary | ICD-10-CM | POA: Diagnosis not present

## 2019-11-26 DIAGNOSIS — Z03818 Encounter for observation for suspected exposure to other biological agents ruled out: Secondary | ICD-10-CM | POA: Diagnosis not present

## 2019-11-27 ENCOUNTER — Inpatient Hospital Stay: Payer: Medicare HMO | Attending: Oncology | Admitting: Oncology

## 2019-11-27 ENCOUNTER — Encounter: Payer: Self-pay | Admitting: Oncology

## 2019-11-27 DIAGNOSIS — D693 Immune thrombocytopenic purpura: Secondary | ICD-10-CM | POA: Diagnosis not present

## 2019-11-27 NOTE — Progress Notes (Signed)
Patient denies any concerns today.  

## 2019-12-01 ENCOUNTER — Telehealth (INDEPENDENT_AMBULATORY_CARE_PROVIDER_SITE_OTHER): Payer: Medicare HMO | Admitting: Family Medicine

## 2019-12-01 ENCOUNTER — Other Ambulatory Visit: Payer: Self-pay

## 2019-12-01 ENCOUNTER — Encounter: Payer: Self-pay | Admitting: Family Medicine

## 2019-12-01 VITALS — BP 132/21

## 2019-12-01 DIAGNOSIS — Z20822 Contact with and (suspected) exposure to covid-19: Secondary | ICD-10-CM | POA: Diagnosis not present

## 2019-12-01 DIAGNOSIS — R058 Other specified cough: Secondary | ICD-10-CM

## 2019-12-01 DIAGNOSIS — R05 Cough: Secondary | ICD-10-CM | POA: Diagnosis not present

## 2019-12-01 DIAGNOSIS — R112 Nausea with vomiting, unspecified: Secondary | ICD-10-CM

## 2019-12-01 MED ORDER — BENZONATATE 100 MG PO CAPS: 100.0000 mg | ORAL_CAPSULE | Freq: Three times a day (TID) | ORAL | 0 refills | Status: DC | PRN

## 2019-12-01 MED ORDER — PREDNISONE 10 MG PO TABS: ORAL_TABLET | ORAL | 0 refills | Status: DC

## 2019-12-01 MED ORDER — ONDANSETRON HCL 4 MG PO TABS: 4.0000 mg | ORAL_TABLET | Freq: Three times a day (TID) | ORAL | 1 refills | Status: DC | PRN

## 2019-12-01 NOTE — Patient Instructions (Addendum)
COVID exposure Start medications for cough, nausea, and prednisone Return to care sooner or go to hospital ED / Urgent Care if severe worsening symptoms as discussed  Please schedule a Follow-up Appointment to: Return in about 1 week (around 12/08/2019), or if symptoms worsen or fail to improve, for URI / possible COVID.  If you have any other questions or concerns, please feel free to call the office or send a message through [place]. You may also schedule an earlier appointment if necessary.  Additionally, you may be receiving a survey about your experience at our office within a few days to 1 week by e-mail or mail. We value your feedback.  [name], DO [place]

## 2019-12-01 NOTE — Progress Notes (Signed)
Subjective:    Patient ID: Erica Bartlett, female    DOB: Jun 27, 1955, 64 y.o.   MRN: [id number]  Erica Bartlett is a 64 y.o. female presenting on 12/01/2019 for Covid Exposure (HA, cough, nausea, nasal congestion denies fever but could not differentiate since getting chemos for chills or bodyache onset 5 days )  Patient presents for a same day appointment.  Virtual / Telehealth Encounter - Video Visit via MyChart The purpose of this virtual visit is to provide medical care while limiting exposure to the novel coronavirus (COVID19) for both patient and office staff.  Consent was obtained for remote visit:  Yes.   Answered questions that patient had about telehealth interaction:  Yes.   I discussed the limitations, risks, security and privacy concerns of performing an evaluation and management service by video/telephone. I also discussed with the patient that there may be a patient responsible charge related to this service. The patient expressed understanding and agreed to proceed.  Patient Location: Home Provider Location: [facility] (Office)   HPI   COVID19 Exposure Recent exposure of grandchildren x 5 with COVID positive, with fevers, and sick symptoms. She has been vaccinated back in Feb 2021 She admits some low grade fever, only, some congestion and headache, cough. On chemotherapy Admits headache cough, nausea Denies dyspnea, chest pain   Health Maintenance:  COVID19 Vaccine Moderna 06/09/19, 07/05/19  Depression screen PHQ 2/9 06/17/2019 03/28/2019 12/17/2018  Decreased Interest 0 0 0  Down, Depressed, Hopeless 0 0 1  PHQ - 2 Score 0 0 1  Altered sleeping - - -  Tired, decreased energy - - -  Change in appetite - - -  Feeling bad or failure about yourself  - - -  Trouble concentrating - - -  Moving slowly or fidgety/restless - - -  Suicidal thoughts - - -  PHQ-9 Score - - -  Difficult doing work/chores - - -    Social History   Tobacco  Use  . Smoking status: Current Some Day Smoker    Packs/day: 0.15    Types: Cigarettes  . Smokeless tobacco: Never Used  . Tobacco comment: 1 pack takes 3.5-4 weeks to finish   Vaping Use  . Vaping Use: Never used  Substance Use Topics  . Alcohol use: No  . Drug use: No    Review of Systems Per HPI unless specifically indicated above     Objective:    BP (!) 132/21   Wt Readings from Last 3 Encounters:  09/01/19 176 lb (79.8 kg)  07/23/19 170 lb (77.1 kg)  07/16/19 171 lb 1.6 oz (77.6 kg)    Physical Exam   Note examination was completely remotely via video observation objective data only  Gen - well-appearing, no acute distress or apparent pain, comfortable HEENT - eyes appear clear without discharge or redness Heart/Lungs - cannot examine virtually - observed no evidence of coughing or labored breathing. Abd - cannot examine virtually  Skin - face visible today- no rash Neuro - awake, alert, oriented Psych - not anxious appearing   Results for orders placed or performed in visit on 11/17/19  CBC with Differential/Platelet  Result Value Ref Range   WBC 6.6 4.0 - 10.5 K/uL   RBC 5.04 3.87 - 5.11 MIL/uL   Hemoglobin 14.6 12.0 - 15.0 g/dL   HCT 42.6 36 - 46 %   MCV 84.5 80.0 - 100.0 fL   MCH 29.0 26.0 - 34.0 pg  MCHC 34.3 30.0 - 36.0 g/dL   RDW 14.5 11.5 - 15.5 %   Platelets 173 150 - 400 K/uL   nRBC 0.0 0.0 - 0.2 %   Neutrophils Relative % 67 %   Neutro Abs 4.5 1.7 - 7.7 K/uL   Lymphocytes Relative 24 %   Lymphs Abs 1.6 0.7 - 4.0 K/uL   Monocytes Relative 5 %   Monocytes Absolute 0.3 0 - 1 K/uL   Eosinophils Relative 2 %   Eosinophils Absolute 0.1 0 - 0 K/uL   Basophils Relative 1 %   Basophils Absolute 0.0 0 - 0 K/uL   Immature Granulocytes 1 %   Abs Immature Granulocytes 0.03 0.00 - 0.07 K/uL      Assessment & Plan:   Problem List Items Addressed This Visit    None    Visit Diagnoses    Cough with exposure to COVID-19 virus    -  Primary    Relevant Medications   benzonatate (TESSALON) 100 MG capsule   predniSONE (DELTASONE) 10 MG tablet   Intractable vomiting with nausea, unspecified vomiting type       Relevant Medications   ondansetron (ZOFRAN) 4 MG tablet      Clinicalyl consistent with viral syndrome URI, possible COVID despite fully vaccinated status 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 benzonatate (TESSALON) 100 MG capsule    Sig: Take 1 capsule (100 mg total) by mouth 3 (three) times daily as needed for cough.    Dispense:  30 capsule    Refill:  0  . ondansetron (ZOFRAN) 4 MG tablet    Sig: Take 1 tablet (4 mg total) by mouth every 8 (eight) hours as needed for nausea or vomiting.    Dispense:  20 tablet    Refill:  1  . predniSONE (DELTASONE) 10 MG tablet    Sig: Take 6 tabs with breakfast Day 1, 5 tabs Day 2, 4 tabs Day 3, 3 tabs Day 4, 2 tabs Day 5, 1 tab Day 6.    Dispense:  21 tablet    Refill:  0     Follow up plan: Return in about 1 week (around 12/08/2019), or if symptoms worsen or fail to improve, for URI / possible COVID.  Patient verbalizes understanding with the above medical recommendations including the limitation of remote medical advice.  Specific follow-up and call-back criteria were given for patient to follow-up or seek medical care more urgently if needed.  Total duration of direct patient care provided via video conference: 10  minutes  [name], [facility] 12/01/2019, 9:40 AM

## 2019-12-08 ENCOUNTER — Other Ambulatory Visit: Payer: Self-pay

## 2019-12-08 ENCOUNTER — Telehealth (INDEPENDENT_AMBULATORY_CARE_PROVIDER_SITE_OTHER): Payer: Medicare HMO | Admitting: Family Medicine

## 2019-12-08 ENCOUNTER — Encounter: Payer: Self-pay | Admitting: Family Medicine

## 2019-12-08 DIAGNOSIS — J0111 Acute recurrent frontal sinusitis: Secondary | ICD-10-CM

## 2019-12-08 MED ORDER — PREDNISONE 50 MG PO TABS: 50.0000 mg | ORAL_TABLET | Freq: Every day | ORAL | 0 refills | Status: DC

## 2019-12-08 MED ORDER — LEVOFLOXACIN 500 MG PO TABS: 500.0000 mg | ORAL_TABLET | Freq: Every day | ORAL | 0 refills | Status: DC

## 2019-12-08 NOTE — Patient Instructions (Signed)
1. Levaquin 500 x 7 days and repeat Prednisone 50mg  daily x 5 day 2. Future still reconsider ENT if able to get re-established 3. Due for COVID19 BOOSTER of Moderna, talk with Oncology and pharmacy  Please schedule a Follow-up Appointment to: No follow-ups on file.  If you have any other questions or concerns, please feel free to call the office or send a message through [facility]. You may also schedule an earlier appointment if necessary.  Additionally, you may be receiving a survey about your experience at our office within a few days to 1 week by e-mail or mail. We value your feedback.  [name], DO [place]

## 2019-12-08 NOTE — Progress Notes (Signed)
Subjective:    Patient ID: Erica Bartlett, female    DOB: [birth year], 64 y.o.   MRN: [id number]  Erica Bartlett is a 64 y.o. female presenting on 12/08/2019 for Cough (follow up --thick mucus denies fever)  Virtual / Telehealth Encounter - Video Visit via MyChart The purpose of this virtual visit is to provide medical care while limiting exposure to the novel coronavirus (COVID19) for both patient and office staff.  Consent was obtained for remote visit:  Yes.   Answered questions that patient had about telehealth interaction:  Yes.   I discussed the limitations, risks, security and privacy concerns of performing an evaluation and management service by video/telephone. I also discussed with the patient that there may be a patient responsible charge related to this service. The patient expressed understanding and agreed to proceed.  Patient Location: Home Provider Location: [facility] (Office)   HPI   Acute Recurrent Sinusitis COVID19 Exposure Last seen 12/01/19 after recent exposure of grandchildren x 5 with COVID positive, with fevers, and sick symptoms. Her initial COVID test was negative on 11/26/19. She had a repeat 14 day covid test today, pending result. She was treated with Prednisone at that time and cough medicine. Most of her grandchildren have improved, one age 68 still has reduced smell and taste She has been vaccinated back in Feb 2021  She is doing well now, without fever but now has worsening thickened sinus drainage with sinus pain and pressure similar to prior recurrent sinus infections Last sinus treated with Augmentin 10/2019 On chemotherapy Admits headache cough Denies dyspnea, chest pain  Health Maintenance:  Updated on [place] vaccine 06/09/19 and 07/05/19. She is now eligible for COVID booster of moderna, since she is a high risk immunocompromised patient with chemotherapy, followed by Oncology she will follow-up with  them  Depression screen [facility] 2/9 06/17/2019 03/28/2019 12/17/2018  Decreased Interest 0 0 0  Down, Depressed, Hopeless 0 0 1  PHQ - 2 Score 0 0 1  Altered sleeping - - -  Tired, decreased energy - - -  Change in appetite - - -  Feeling bad or failure about yourself  - - -  Trouble concentrating - - -  Moving slowly or fidgety/restless - - -  Suicidal thoughts - - -  PHQ-9 Score - - -  Difficult doing work/chores - - -    Social History   Tobacco Use  . Smoking status: Current Some Day Smoker    Packs/day: 0.15    Types: Cigarettes  . Smokeless tobacco: Never Used  . Tobacco comment: 1 pack takes 3.5-4 weeks to finish   Vaping Use  . Vaping Use: Never used  Substance Use Topics  . Alcohol use: No  . Drug use: No    Review of Systems Per HPI unless specifically indicated above     Objective:    There were no vitals taken for this visit.  Wt Readings from Last 3 Encounters:  09/01/19 176 lb (79.8 kg)  07/23/19 170 lb (77.1 kg)  07/16/19 171 lb 1.6 oz (77.6 kg)    Physical Exam   Note examination was completely remotely via video observation objective data only  Gen - well-appearing, no acute distress or apparent pain, comfortable HEENT - eyes appear clear without discharge or redness Heart/Lungs - cannot examine virtually - observed no evidence of coughing or labored breathing. Skin - face visible today- no rash Neuro - awake, alert, oriented Psych - not  anxious appearing   Results for orders placed or performed in visit on 11/17/19  CBC with Differential/Platelet  Result Value Ref Range   WBC 6.6 4.0 - 10.5 K/uL   RBC 5.04 3.87 - 5.11 MIL/uL   Hemoglobin 14.6 12.0 - 15.0 g/dL   HCT 42.6 36 - 46 %   MCV 84.5 80.0 - 100.0 fL   MCH 29.0 26.0 - 34.0 pg   MCHC 34.3 30.0 - 36.0 g/dL   RDW 14.5 11.5 - 15.5 %   Platelets 173 150 - 400 K/uL   nRBC 0.0 0.0 - 0.2 %   Neutrophils Relative % 67 %   Neutro Abs 4.5 1.7 - 7.7 K/uL   Lymphocytes Relative 24 %   Lymphs  Abs 1.6 0.7 - 4.0 K/uL   Monocytes Relative 5 %   Monocytes Absolute 0.3 0 - 1 K/uL   Eosinophils Relative 2 %   Eosinophils Absolute 0.1 0 - 0 K/uL   Basophils Relative 1 %   Basophils Absolute 0.0 0 - 0 K/uL   Immature Granulocytes 1 %   Abs Immature Granulocytes 0.03 0.00 - 0.07 K/uL      Assessment & Plan:   Problem List Items Addressed This Visit    None    Visit Diagnoses    Acute recurrent frontal sinusitis    -  Primary   Relevant Medications   levofloxacin (LEVAQUIN) 500 MG tablet   predniSONE (DELTASONE) 50 MG tablet      Meds ordered this encounter  Medications  . levofloxacin (LEVAQUIN) 500 MG tablet    Sig: Take 1 tablet (500 mg total) by mouth daily. For 7 days    Dispense:  7 tablet    Refill:  0  . predniSONE (DELTASONE) 50 MG tablet    Sig: Take 1 tablet (50 mg total) by mouth daily with breakfast.    Dispense:  5 tablet    Refill:  0   Suspected Subacute on chronic recurrentSinusitis - with RECURRENCE, now concern with progression of symptomssimilar to previous Sinusitis, has had worsening issue if prolonged infection. - Last sinusitis w/ antibiotics 6-8 weeks ago with Augmentin only partial improvement, has in past responded to repeat courses - Reassuring without high risk symptoms - Afebrile, without dyspnea - has some immune deficiency  COVID19 negative 11/26/19, repeat test done today 8/16 pending result given exposure to family with COVID  She is updated on vaccine Moderna Feb/March 2021. She is now DUE for booster dose as high risk immunocompromised, she will work with her Oncology team to get this arranged.  1. Levaquin 500 x 7 days and repeat Prednisone 50mg  daily x 5 day 2. Future still reconsider ENT if able to get re-established    Follow up plan: Return if symptoms worsen or fail to improve.   Patient verbalizes understanding with the above medical recommendations including the limitation of remote medical advice.  Specific  follow-up and call-back criteria were given for patient to follow-up or seek medical care more urgently if needed.  Total duration of direct patient care provided via video conference: 15 minutes   [name], [facility] 12/08/2019, 2:37 PM

## 2019-12-09 ENCOUNTER — Ambulatory Visit: Payer: Medicare HMO | Admitting: Podiatry

## 2019-12-09 ENCOUNTER — Other Ambulatory Visit: Payer: Self-pay

## 2019-12-09 DIAGNOSIS — M79671 Pain in right foot: Secondary | ICD-10-CM | POA: Diagnosis not present

## 2019-12-09 DIAGNOSIS — M79672 Pain in left foot: Secondary | ICD-10-CM | POA: Diagnosis not present

## 2019-12-09 DIAGNOSIS — L989 Disorder of the skin and subcutaneous tissue, unspecified: Secondary | ICD-10-CM | POA: Diagnosis not present

## 2019-12-09 MED ORDER — CLOTRIMAZOLE-BETAMETHASONE 1-0.05 % EX CREA: 1.0000 "application " | TOPICAL_CREAM | Freq: Two times a day (BID) | CUTANEOUS | 2 refills | Status: DC

## 2019-12-09 MED ORDER — OXYCODONE-ACETAMINOPHEN 5-325 MG PO TABS: 1.0000 | ORAL_TABLET | Freq: Four times a day (QID) | ORAL | 0 refills | Status: DC | PRN

## 2019-12-09 NOTE — Progress Notes (Signed)
   Subjective: 64 y.o. female presenting to the office today for follow up evaluation of bilateral foot pain secondary to symptomatic multiple porokeratotic lesions and diffuse hyperkeratosis.  She has been applying the OTC corn and callus remover in combination with the betamethasone cream with modest improvement.  She presents for further treatment evaluation.  No new complaints at this time  Past Medical History:  Diagnosis Date  . Hypertension      Objective:  Physical Exam General: Alert and oriented x3 in no acute distress  Dermatology: Hyperkeratotic lesion(s) present on the bilateral feet. Pain on palpation with a central nucleated core noted. Skin is warm, dry and supple bilateral lower extremities. Negative for open lesions or macerations.  Vascular: Palpable pedal pulses bilaterally. No edema or erythema noted. Capillary refill within normal limits.  Neurological: Epicritic and protective threshold grossly intact bilaterally.   Musculoskeletal Exam: Pain on palpation at the keratotic lesion(s) noted. Range of motion within normal limits bilateral. Muscle strength 5/5 in all groups bilateral.  Assessment: 1. 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 Patient evaluated 2. Excisional debridement of keratoic lesion(s) using a chisel blade was performed without incident 3.  Cantharone applied to the central course of the porokeratosis of the bilateral feet.  Dressing was applied 4.  Prescription for Lotrisone cream to be used in combination with the OTC corn and callus remover.  Last prescription was betamethasone 0.05% cream but I would like to add an antifungal component to it 5.  Return to clinic in 3 weeks  Edrick Kins, DPM [facility]  Dr. Edrick Kins, [address]                                        [place], [place] [address]                Office [phone]  Fax [phone]

## 2019-12-23 ENCOUNTER — Ambulatory Visit: Payer: Medicare HMO | Admitting: Podiatry

## 2019-12-23 ENCOUNTER — Other Ambulatory Visit: Payer: Self-pay

## 2019-12-23 DIAGNOSIS — M79671 Pain in right foot: Secondary | ICD-10-CM

## 2019-12-23 DIAGNOSIS — M79672 Pain in left foot: Secondary | ICD-10-CM

## 2019-12-23 DIAGNOSIS — Q828 Other specified congenital malformations of skin: Secondary | ICD-10-CM

## 2019-12-23 DIAGNOSIS — L989 Disorder of the skin and subcutaneous tissue, unspecified: Secondary | ICD-10-CM

## 2019-12-23 NOTE — Patient Instructions (Signed)
Look for urea 40% cream or ointment and apply to the thickened dry skin / calluses. This can be bought over the counter, at a pharmacy or online such as [employer].  

## 2019-12-23 NOTE — Progress Notes (Signed)
  Subjective:  Patient ID: Erica Bartlett, female    DOB: Sep 15, 1955,  MRN: [id number]  Chief Complaint  Patient presents with  . Follow-up    2-3 WK F.U- pt states she has been a little more active doing more walking- states she has been having some discomfort and feel poro are comign back     64 y.o. female presents with the above complaint. History confirmed with patient. Multiple painful porokeratoses on both feet. Dr Amalia Hailey applied cantharone at last visit and has had some relief with this.  Objective:  Physical Exam: warm, good capillary refill, no trophic changes or ulcerative lesions, normal DP and PT pulses and normal sensory exam. Left Foot: porokeratosis with blistering, recurrent hypkeratosis and pain: plantar halux  Right Foot: multiple porokeratoses with blistering, recurrent hypkeratosis and pain: medial hallux, plantar 4th toe, midfoot, heel, plantar hallux Assessment:   1. Benign skin lesion   2. Porokeratosis   3. Pain in both feet      Plan:  Patient was evaluated and treated and all questions answered.   All symptomatic hyperkeratoses were safely debrided with a sterile #15 blade to patient's level of comfort without incident. We discussed preventative and palliative care of these lesions including supportive and accommodative shoegear, padding, prefabricated and custom molded accommodative orthoses, use of a pumice stone and lotions/creams daily.  Advised to use urea cream and a pumice stone daily  Lanae Crumbly, DPM 12/23/2019     Return in about 3 weeks (around 01/13/2020).

## 2020-01-13 ENCOUNTER — Ambulatory Visit: Payer: Medicare HMO | Admitting: Podiatry

## 2020-01-13 ENCOUNTER — Other Ambulatory Visit: Payer: Self-pay

## 2020-01-13 DIAGNOSIS — M79671 Pain in right foot: Secondary | ICD-10-CM

## 2020-01-13 DIAGNOSIS — Q828 Other specified congenital malformations of skin: Secondary | ICD-10-CM

## 2020-01-13 DIAGNOSIS — L989 Disorder of the skin and subcutaneous tissue, unspecified: Secondary | ICD-10-CM

## 2020-01-13 DIAGNOSIS — M79672 Pain in left foot: Secondary | ICD-10-CM

## 2020-01-13 NOTE — Progress Notes (Signed)
   Subjective: 64 y.o. female presenting to the office today for follow up evaluation of bilateral foot pain secondary to symptomatic multiple porokeratotic lesions and diffuse hyperkeratosis.  She has been applying the OTC corn and callus remover in combination with Lotrisone cream with significant improvement.  She presents for further treatment evaluation.  No new complaints at this time  Past Medical History:  Diagnosis Date  . Hypertension      Objective:  Physical Exam General: Alert and oriented x3 in no acute distress  Dermatology: Hyperkeratotic lesion(s) present on the bilateral feet. Pain on palpation with a central nucleated core noted.  Diffuse hyperkeratosis also noted throughout the right foot.  Multiple porokeratosis with central nucleated cores noted throughout the right foot.  Porokeratosis noted to the plantar aspect of the left hallux.  Skin is warm, dry and supple bilateral lower extremities. Negative for open lesions or macerations.  Vascular: Palpable pedal pulses bilaterally. No edema or erythema noted. Capillary refill within normal limits.  Neurological: Epicritic and protective threshold grossly intact bilaterally.   Musculoskeletal Exam: Pain on palpation at the keratotic lesion(s) noted. Range of motion within normal limits bilateral. Muscle strength 5/5 in all groups bilateral.  Assessment: 1. 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 Patient evaluated.  Patient is doing significantly better and she notices a great improvement in the pain 2. Excisional debridement of keratoic lesion(s) using a chisel blade was performed without incident 3.  Continue Salinocaine in combination with Lotrisone cream daily 4.  Return to clinic in 3 weeks for follow-up debridement  Edrick Kins, DPM [facility]  Dr. Edrick Kins, [address]                                        [place], [place] [address]                 Office [phone]  Fax [phone]

## 2020-02-03 ENCOUNTER — Other Ambulatory Visit: Payer: Self-pay

## 2020-02-03 ENCOUNTER — Ambulatory Visit: Payer: Medicare HMO | Admitting: Podiatry

## 2020-02-03 DIAGNOSIS — M79671 Pain in right foot: Secondary | ICD-10-CM

## 2020-02-03 DIAGNOSIS — Q828 Other specified congenital malformations of skin: Secondary | ICD-10-CM | POA: Diagnosis not present

## 2020-02-03 DIAGNOSIS — L989 Disorder of the skin and subcutaneous tissue, unspecified: Secondary | ICD-10-CM

## 2020-02-03 DIAGNOSIS — M79672 Pain in left foot: Secondary | ICD-10-CM | POA: Diagnosis not present

## 2020-02-03 NOTE — Progress Notes (Signed)
   Subjective: 64 y.o. female presenting to the office today for follow up evaluation of bilateral foot pain secondary to symptomatic multiple porokeratotic lesions and diffuse hyperkeratosis.  She has been applying the OTC corn and callus remover in combination with Lotrisone cream with significant improvement.  She presents for further treatment evaluation.  No new complaints at this time  Past Medical History:  Diagnosis Date  . Hypertension      Objective:  Physical Exam General: Alert and oriented x3 in no acute distress  Dermatology: Hyperkeratotic lesion(s) present on the bilateral feet. Pain on palpation with a central nucleated core noted.  Diffuse hyperkeratosis also noted throughout the right foot.  Multiple porokeratosis with central nucleated cores noted throughout the right foot.  Porokeratosis noted to the plantar aspect of the left hallux.  Skin is warm, dry and supple bilateral lower extremities. Negative for open lesions or macerations.  Vascular: Palpable pedal pulses bilaterally. No edema or erythema noted. Capillary refill within normal limits.  Neurological: Epicritic and protective threshold grossly intact bilaterally.   Musculoskeletal Exam: Pain on palpation at the keratotic lesion(s) noted. Range of motion within normal limits bilateral. Muscle strength 5/5 in all groups bilateral.  Assessment: 1. 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 Patient evaluated.  Patient is doing significantly better and she notices a great improvement in the pain 2. Excisional debridement of keratoic lesion(s) using a chisel blade was performed without incident 3.  Discontinue Salinocaine/salicylic acid for the moment. OTC Revitaderm urea 40% lotion provided for the patient today.  Combined with Lotrisone cream daily 4.  Return to clinic in 3 weeks for follow-up debridement  Edrick Kins, DPM [facility]  Dr. Edrick Kins, [place]                                        [place],  [address]                Office [phone]  Fax [phone]

## 2020-02-14 ENCOUNTER — Other Ambulatory Visit: Payer: Self-pay | Admitting: Family Medicine

## 2020-02-14 DIAGNOSIS — I10 Essential (primary) hypertension: Secondary | ICD-10-CM

## 2020-02-24 ENCOUNTER — Ambulatory Visit: Payer: Medicare HMO | Admitting: Podiatry

## 2020-03-02 ENCOUNTER — Ambulatory Visit: Payer: Medicare HMO | Admitting: Podiatry

## 2020-03-02 ENCOUNTER — Other Ambulatory Visit: Payer: Self-pay

## 2020-03-02 ENCOUNTER — Inpatient Hospital Stay: Payer: Medicare HMO | Attending: Oncology

## 2020-03-02 DIAGNOSIS — D693 Immune thrombocytopenic purpura: Secondary | ICD-10-CM | POA: Diagnosis present

## 2020-03-02 DIAGNOSIS — Z79899 Other long term (current) drug therapy: Secondary | ICD-10-CM | POA: Insufficient documentation

## 2020-03-02 DIAGNOSIS — G8929 Other chronic pain: Secondary | ICD-10-CM | POA: Insufficient documentation

## 2020-03-02 DIAGNOSIS — D72829 Elevated white blood cell count, unspecified: Secondary | ICD-10-CM | POA: Diagnosis not present

## 2020-03-02 DIAGNOSIS — M549 Dorsalgia, unspecified: Secondary | ICD-10-CM | POA: Diagnosis not present

## 2020-03-02 DIAGNOSIS — R69 Illness, unspecified: Secondary | ICD-10-CM | POA: Diagnosis not present

## 2020-03-02 DIAGNOSIS — F17210 Nicotine dependence, cigarettes, uncomplicated: Secondary | ICD-10-CM | POA: Diagnosis not present

## 2020-03-02 LAB — CBC WITH DIFFERENTIAL/PLATELET
Abs Immature Granulocytes: 0.05 10*3/uL (ref 0.00–0.07)
Basophils Absolute: 0 10*3/uL (ref 0.0–0.1)
Basophils Relative: 0 %
Eosinophils Absolute: 0 10*3/uL (ref 0.0–0.5)
Eosinophils Relative: 0 %
HCT: 42.5 % (ref 36.0–46.0)
Hemoglobin: 14.6 g/dL (ref 12.0–15.0)
Immature Granulocytes: 1 %
Lymphocytes Relative: 16 %
Lymphs Abs: 1.7 10*3/uL (ref 0.7–4.0)
MCH: 29.2 pg (ref 26.0–34.0)
MCHC: 34.4 g/dL (ref 30.0–36.0)
MCV: 85 fL (ref 80.0–100.0)
Monocytes Absolute: 0.6 10*3/uL (ref 0.1–1.0)
Monocytes Relative: 5 %
Neutro Abs: 8.6 10*3/uL — ABNORMAL HIGH (ref 1.7–7.7)
Neutrophils Relative %: 78 %
Platelets: 225 10*3/uL (ref 150–400)
RBC: 5 MIL/uL (ref 3.87–5.11)
RDW: 14.6 % (ref 11.5–15.5)
WBC: 11 10*3/uL — ABNORMAL HIGH (ref 4.0–10.5)
nRBC: 0 % (ref 0.0–0.2)

## 2020-03-06 DIAGNOSIS — N6310 Unspecified lump in the right breast, unspecified quadrant: Secondary | ICD-10-CM | POA: Insufficient documentation

## 2020-03-06 NOTE — Progress Notes (Signed)
[name]  Telephone:([phone]) [phone] Fax:(336) [phone]  ID: Erica Bartlett OB: 1955-12-11  MR#: [id number]  FWY#:[id number]  Patient Care Team: Olin Hauser, DO as PCP - General (Family Medicine) Chucky May, MD as Consulting Physician (Psychiatry) Lloyd Huger, MD as Consulting Physician (Oncology) Greg Cutter, LCSW as Social Worker (Licensed Clinical Social Worker)   CHIEF COMPLAINT: ITP, right chest wall lesion  INTERVAL HISTORY: Patient returns to clinic today as an add-on for evaluation of an enlarging mole on her right chest wall.  She is anxious, but otherwise feels well.  She continues to have chronic back pain.  She denies any easy bleeding or bruising.  She has no neurologic complaints. She denies any chest pain, shortness of breath, cough, or hemoptysis.  She has no nausea, vomiting, constipation, or diarrhea. She has no urinary complaints.  Patient offers no further specific complaints today.  REVIEW OF SYSTEMS:    Review of Systems  Constitutional: Negative for fever, malaise/fatigue and weight loss.  HENT: Negative.  Negative for congestion.   Respiratory: Negative.  Negative for cough and shortness of breath.   Cardiovascular: Negative.  Negative for chest pain and leg swelling.  Gastrointestinal: Negative.  Negative for blood in stool, melena and nausea.  Genitourinary: Negative.  Negative for hematuria.  Musculoskeletal: Positive for back pain. Negative for joint pain.  Skin: Negative.  Negative for rash.  Neurological: Negative.  Negative for sensory change, focal weakness, weakness and headaches.  Endo/Heme/Allergies: Negative.  Does not bruise/bleed easily.  Psychiatric/Behavioral: Negative.  Negative for depression. The patient is not nervous/anxious and does not have insomnia.     As per HPI. Otherwise, a complete review of systems is negative.  PAST MEDICAL HISTORY: Past Medical History:  Diagnosis Date  .  Hypertension     PAST SURGICAL HISTORY: Past Surgical History:  Procedure Laterality Date  . ABDOMINAL HYSTERECTOMY    . BREAST BIOPSY Left    age 39 surgical bx neg  . CESAREAN SECTION    . cyst removed     breast, benign    FAMILY HISTORY: Reviewed and unchanged. No reported history of malignancy or chronic disease.     ADVANCED DIRECTIVES:    HEALTH MAINTENANCE: Social History   Tobacco Use  . Smoking status: Current Some Day Smoker    Packs/day: 0.15    Types: Cigarettes  . Smokeless tobacco: Never Used  . Tobacco comment: 1 pack takes 3.5-4 weeks to finish   Vaping Use  . Vaping Use: Never used  Substance Use Topics  . Alcohol use: No  . Drug use: No    Allergies  Allergen Reactions  . Doxycycline Nausea And Vomiting  . Penicillins Hives    Current Outpatient Medications  Medication Sig Dispense Refill  . acetaminophen (TYLENOL) 500 MG tablet Take 500 mg by mouth every 6 (six) hours as needed.    Marland Kitchen amLODipine (NORVASC) 10 MG tablet Take 1 tablet (10 mg total) by mouth daily. 90 tablet 3  . amoxicillin-clavulanate (AUGMENTIN) 875-125 MG tablet Take 1 tablet by mouth 2 (two) times daily. 10 days 20 tablet 0  . cetirizine (ZYRTEC) 10 MG tablet Take 1 tablet (10 mg total) by mouth daily. 30 tablet 11  . citalopram (CELEXA) 20 MG tablet Take 20 mg by mouth as needed.     . clonazePAM (KLONOPIN) 1 MG tablet Take 1-2 mg by mouth 3 (three) times daily as needed. Reported on 10/11/2015    . clotrimazole-betamethasone (LOTRISONE)  cream Apply 1 application topically 2 (two) times daily. 45 g 2  . cyclobenzaprine (FLEXERIL) 10 MG tablet Take 1 tablet (10 mg total) by mouth 3 (three) times daily as needed for muscle spasms. 60 tablet 2  . diphenhydrAMINE (BENADRYL) 25 mg capsule Take 25 mg by mouth as needed.    . fluticasone (FLONASE) 50 MCG/ACT nasal spray Place 2 sprays into both nostrils daily. 16 g 11  . hydrochlorothiazide (HYDRODIURIL) 25 MG tablet Take 1 tablet  (25 mg total) by mouth daily. 90 tablet 3  . ondansetron (ZOFRAN) 4 MG tablet Take 1 tablet (4 mg total) by mouth every 8 (eight) hours as needed for nausea or vomiting. 20 tablet 1  . pantoprazole (PROTONIX) 40 MG tablet Take 1 tablet (40 mg total) by mouth daily before breakfast. 30 tablet 2  . predniSONE (DELTASONE) 20 MG tablet Take daily with food. Start with 89m (3 pills) x 2 days, then reduce to 460m(2 pills) x 2 days, then 2053m1 pill) x 3 days 13 tablet 0  . VENTOLIN HFA 108 (90 Base) MCG/ACT inhaler INHALE 1-2 PUFFS INTO THE LUNGS EVERY 6 (SIX) HOURS AS NEEDED FOR WHEEZING OR SHORTNESS OF BREATH. 18 g 0   No current facility-administered medications for this visit.    OBJECTIVE: Vitals:   03/09/20 1327  BP: 131/81  Pulse: 77  Temp: (!) 97.2 F (36.2 C)  SpO2: 100%     Body mass index is 30.18 kg/m.    ECOG FS:0 - Asymptomatic  General: Well-developed, well-nourished, no acute distress. Eyes: Pink conjunctiva, anicteric sclera. HEENT: Normocephalic, moist mucous membranes. Lungs: No audible wheezing or coughing. Heart: Regular rate and rhythm. Abdomen: Soft, nontender, no obvious distention. Musculoskeletal: No edema, cyanosis, or clubbing. Neuro: Alert, answering all questions appropriately. Cranial nerves grossly intact. Skin: Oval-shaped nodule dark purple in color on right chest wall less than 1 cm.  Appears benign. Psych: Normal affect.  LAB RESULTS:  Lab Results  Component Value Date   NA 138 08/17/2016   K 3.6 08/17/2016   CL 101 08/17/2016   CO2 25 08/17/2016   GLUCOSE 81 08/17/2016   BUN 11 08/17/2016   CREATININE 0.88 08/17/2016   CALCIUM 9.4 08/17/2016   PROT 7.3 08/17/2016   ALBUMIN 4.0 08/17/2016   AST 25 08/17/2016   ALT 30 (H) 08/17/2016   ALKPHOS 85 08/17/2016   BILITOT 0.3 08/17/2016   GFRNONAA 64 08/04/2015   GFRAA 74 08/04/2015    Lab Results  Component Value Date   WBC 11.0 (H) 03/02/2020   NEUTROABS 8.6 (H) 03/02/2020   HGB  14.6 03/02/2020   HCT 42.5 03/02/2020   MCV 85.0 03/02/2020   PLT 225 03/02/2020     HEMATOLOGY HISTORY: Bone marrow biopsy performed on July 28, 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 Patient responded to steroids, but the results were not durable.  Patient has received weekly Rituxan x4 with excellent results each time completing treatment on the following dates:  1.  Aug 30, 2015. 2.  November 23, 2016. 3.  December 20, 2017.   4.  July 23, 2019.  STUDIES: No results found.  ASSESSMENT: Bone marrow biopsy proven ITP, right chest wall lesion.  PLAN:    1.  Right chest wall lesion: Appears benign, but have recommended referral to dermatology for further evaluation. 2.  ITP: Patient's platelet count continues to be within normal limits.  Her last round of Rituxan in March 2021.  Of note, if patient requires Rituxan again, she will need increased premedications  since she has mild reaction with cycle 1.  No intervention is needed at this time.  Return to clinic as previously scheduled for repeat laboratory work and further evaluation. 2.  Leukocytosis: Mild, monitor. 3.  Back pain: Chronic and unchanged.  Continue evaluation and treatment per primary care. 4.  Recurrent congestion/cough: Resolved.  Patient has a normal IgG level of 800. 5.  Foot pain: Patient does not complain of this today.   Patient expressed understanding and was in agreement with this plan. She also understands that She can call clinic at any time with any questions, concerns, or complaints.   Lloyd Huger, MD   03/12/2020 6:47 AM

## 2020-03-08 ENCOUNTER — Ambulatory Visit (INDEPENDENT_AMBULATORY_CARE_PROVIDER_SITE_OTHER): Payer: Medicare HMO | Admitting: Family Medicine

## 2020-03-08 ENCOUNTER — Encounter: Payer: Self-pay | Admitting: Family Medicine

## 2020-03-08 ENCOUNTER — Other Ambulatory Visit: Payer: Self-pay

## 2020-03-08 VITALS — BP 110/57 | HR 79 | Temp 97.5°F | Resp 16 | Ht 64.0 in | Wt 175.0 lb

## 2020-03-08 DIAGNOSIS — M25551 Pain in right hip: Secondary | ICD-10-CM

## 2020-03-08 DIAGNOSIS — J0111 Acute recurrent frontal sinusitis: Secondary | ICD-10-CM

## 2020-03-08 DIAGNOSIS — K219 Gastro-esophageal reflux disease without esophagitis: Secondary | ICD-10-CM | POA: Diagnosis not present

## 2020-03-08 DIAGNOSIS — D492 Neoplasm of unspecified behavior of bone, soft tissue, and skin: Secondary | ICD-10-CM

## 2020-03-08 DIAGNOSIS — D229 Melanocytic nevi, unspecified: Secondary | ICD-10-CM

## 2020-03-08 DIAGNOSIS — G8929 Other chronic pain: Secondary | ICD-10-CM | POA: Diagnosis not present

## 2020-03-08 MED ORDER — PANTOPRAZOLE SODIUM 40 MG PO TBEC: 40.0000 mg | DELAYED_RELEASE_TABLET | Freq: Every day | ORAL | 2 refills | Status: DC

## 2020-03-08 MED ORDER — CYCLOBENZAPRINE HCL 10 MG PO TABS: 10.0000 mg | ORAL_TABLET | Freq: Three times a day (TID) | ORAL | 2 refills | Status: DC | PRN

## 2020-03-08 MED ORDER — PREDNISONE 20 MG PO TABS: ORAL_TABLET | ORAL | 0 refills | Status: DC

## 2020-03-08 MED ORDER — AMOXICILLIN-POT CLAVULANATE 875-125 MG PO TABS: 1.0000 | ORAL_TABLET | Freq: Two times a day (BID) | ORAL | 0 refills | Status: DC

## 2020-03-08 NOTE — Progress Notes (Signed)
Subjective:    Patient ID: Erica Bartlett, female    DOB: [birth year], 64 y.o.   MRN: [id number]  Erica Bartlett is a 64 y.o. female presenting on 03/08/2020 for Sinus Problem (OTC taken few days), Gastroesophageal Reflux, Hip Pain (onset 2 weeks but getting worst last week), and Nevus (change is size and color --couple of months)   HPI   Acute Recurrent Sinusitis Last seen 12/08/19 for telemedicine visit - after recent exposure of grandchildren x 5 with COVID positive, with fevers, and sick symptoms. Her initial COVID test was negative on 11/26/19. Then repeat test for COVID was POSITIVE. She was treated with Levaquin and Prednisone, then 8 days later she tested negative. She has been vaccinated back in Feb 2021  Now today another episode of sinus infection, onset 1 week ago, with thicker yellow discharge drainage and thicker phlegm production.  Right Hip, Chronic Pain Reports same issue with prior fall at work 10/2018 had seen Dr Harlow Mares [facility] - worker's comp case at that time - they focused on Shoulder injury also had injured Right hip, and shoulder. She had been going to Physical Therapy for shoulder. Hip was included in that issue but did not receive PT on hip. Now she is climbing up steps more and get in and out of car. - Now she is having worse hip pain and asks if she should return to worker's comp / ortho or what to do next.  GERD Chronic problem with recurrent heartburn GERD. She takes OTC Tums Rolaid antacid. Not on PPI therapy  Abnormal Mole R chest area noticed enlarged hardened mole over past several months or more. Wants to see Dermatology   Depression screen [facility] 2/9 06/17/2019 03/28/2019 12/17/2018  Decreased Interest 0 0 0  Down, Depressed, Hopeless 0 0 1  PHQ - 2 Score 0 0 1  Altered sleeping - - -  Tired, decreased energy - - -  Change in appetite - - -  Feeling bad or failure about yourself  - - -  Trouble concentrating - - -  Moving slowly or  fidgety/restless - - -  Suicidal thoughts - - -  PHQ-9 Score - - -  Difficult doing work/chores - - -    Social History   Tobacco Use  . Smoking status: Current Some Day Smoker    Packs/day: 0.15    Types: Cigarettes  . Smokeless tobacco: Never Used  . Tobacco comment: 1 pack takes 3.5-4 weeks to finish   Vaping Use  . Vaping Use: Never used  Substance Use Topics  . Alcohol use: No  . Drug use: No    Review of Systems Per HPI unless specifically indicated above     Objective:    BP (!) 110/57   Pulse 79   Temp (!) 97.5 F (36.4 C) (Temporal)   Resp 16   Ht 5\' 4"  (1.626 m)   Wt 175 lb (79.4 kg)   SpO2 100%   BMI 30.04 kg/m   Wt Readings from Last 3 Encounters:  03/08/20 175 lb (79.4 kg)  09/01/19 176 lb (79.8 kg)  07/23/19 170 lb (77.1 kg)    Physical Exam Vitals and nursing note reviewed.  Constitutional:      General: She is not in acute distress.    Appearance: She is well-developed. She is not diaphoretic.     Comments: Well-appearing, comfortable, cooperative  HENT:     Head: Normocephalic and atraumatic.  Eyes:  General:        Right eye: No discharge.        Left eye: No discharge.     Conjunctiva/sclera: Conjunctivae normal.  Neck:     Thyroid: No thyromegaly.  Cardiovascular:     Rate and Rhythm: Normal rate and regular rhythm.     Heart sounds: Normal heart sounds. No murmur heard.   Pulmonary:     Effort: Pulmonary effort is normal. No respiratory distress.     Breath sounds: Normal breath sounds. No wheezing or rales.  Musculoskeletal:     Cervical back: Normal range of motion and neck supple.     Comments: Localized R hip lateral pain, has range of motion intact but pain on hip flex and difficulty standing from sitting able to ambulate  Lymphadenopathy:     Cervical: No cervical adenopathy.  Skin:    General: Skin is warm and dry.     Findings: Lesion (larger harder raised pigmented skin lesion R chest non tender) present. No  erythema or rash.  Neurological:     Mental Status: She is alert and oriented to person, place, and time.  Psychiatric:        Behavior: Behavior normal.     Comments: Well groomed, good eye contact, normal speech and thoughts        Results for orders placed or performed in visit on 03/02/20  CBC with Differential/Platelet  Result Value Ref Range   WBC 11.0 (H) 4.0 - 10.5 K/uL   RBC 5.00 3.87 - 5.11 MIL/uL   Hemoglobin 14.6 12.0 - 15.0 g/dL   HCT 42.5 36 - 46 %   MCV 85.0 80.0 - 100.0 fL   MCH 29.2 26.0 - 34.0 pg   MCHC 34.4 30.0 - 36.0 g/dL   RDW 14.6 11.5 - 15.5 %   Platelets 225 150 - 400 K/uL   nRBC 0.0 0.0 - 0.2 %   Neutrophils Relative % 78 %   Neutro Abs 8.6 (H) 1.7 - 7.7 K/uL   Lymphocytes Relative 16 %   Lymphs Abs 1.7 0.7 - 4.0 K/uL   Monocytes Relative 5 %   Monocytes Absolute 0.6 0.1 - 1.0 K/uL   Eosinophils Relative 0 %   Eosinophils Absolute 0.0 0.0 - 0.5 K/uL   Basophils Relative 0 %   Basophils Absolute 0.0 0.0 - 0.1 K/uL   Immature Granulocytes 1 %   Abs Immature Granulocytes 0.05 0.00 - 0.07 K/uL      Assessment & Plan:   Problem List Items Addressed This Visit    None    Visit Diagnoses    Acute recurrent frontal sinusitis    -  Primary   Relevant Medications   amoxicillin-clavulanate (AUGMENTIN) 875-125 MG tablet   predniSONE (DELTASONE) 20 MG tablet   Chronic right hip pain       Relevant Medications   cyclobenzaprine (FLEXERIL) 10 MG tablet   predniSONE (DELTASONE) 20 MG tablet   Gastroesophageal reflux disease without esophagitis       Relevant Medications   pantoprazole (PROTONIX) 40 MG tablet   Abnormal skin growth       Relevant Orders   Ambulatory referral to Dermatology   Atypical mole       Relevant Orders   Ambulatory referral to Dermatology      #Acute recurrent sinusitis Last treated 11/2019, history of recurrent issue Immunocompromised Trial on Augmentin now - has PCN allergy intolerance but tolerates augmentin without  problem Add prednisone for  sinus and for hip  #Acute on chronic R hip pain Likely bursitis Trial Prednisone  And muscle relaxant flexeril Follow-up w/ [facility] - Dr Harlow Mares - if still considered worker comp otherwise can return here consider injection  #GERD Start trial Pantoprazole 40mg   #referral to dermatology - [place] for abnormal mole on chest  Meds ordered this encounter  Medications  . amoxicillin-clavulanate (AUGMENTIN) 875-125 MG tablet    Sig: Take 1 tablet by mouth 2 (two) times daily. 10 days    Dispense:  20 tablet    Refill:  0  . cyclobenzaprine (FLEXERIL) 10 MG tablet    Sig: Take 1 tablet (10 mg total) by mouth 3 (three) times daily as needed for muscle spasms.    Dispense:  60 tablet    Refill:  2  . pantoprazole (PROTONIX) 40 MG tablet    Sig: Take 1 tablet (40 mg total) by mouth daily before breakfast.    Dispense:  30 tablet    Refill:  2  . predniSONE (DELTASONE) 20 MG tablet    Sig: Take daily with food. Start with 60mg  (3 pills) x 2 days, then reduce to 40mg  (2 pills) x 2 days, then 20mg  (1 pill) x 3 days    Dispense:  13 tablet    Refill:  0    Orders Placed This Encounter  Procedures  . Ambulatory referral to Dermatology    Referral Priority:   Routine    Referral Type:   Consultation    Referral Reason:   Specialty Services Required    Requested Specialty:   Dermatology    Number of Visits Requested:   1     Follow up plan: Return if symptoms worsen or fail to improve.   [name], [facility] 03/08/2020, 4:33 PM

## 2020-03-08 NOTE — Patient Instructions (Addendum)
Thank you for coming to the office today.  Referral to dermatology  [facility]   [place], [place] [address] Hours: 8AM-5PM Phone: [phone]  Stay tuned for apt  Start augmentin and prednisone taper - can help the hip as well  Start Cyclobenzapine (Flexeril) 10mg  tablets (muscle relaxant) - start with half (cut) to one whole pill at night for muscle relaxant - may make you sedated or sleepy (be careful driving or working on this) if tolerated you can take half to whole tab 2 to 3 times daily or every 8 hours as needed  Check into Worker's comp for hip - may  Need to return to Dr Harlow Mares as well.  For GERD acid reflux start Pantoprazole daily   Please schedule a Follow-up Appointment to: Return if symptoms worsen or fail to improve.  If you have any other questions or concerns, please feel free to call the office or send a message through [place]. You may also schedule an earlier appointment if necessary.  Additionally, you may be receiving a survey about your experience at our office within a few days to 1 week by e-mail or mail. We value your feedback.  [name], DO [place]

## 2020-03-09 ENCOUNTER — Inpatient Hospital Stay (HOSPITAL_BASED_OUTPATIENT_CLINIC_OR_DEPARTMENT_OTHER): Payer: Medicare HMO | Admitting: Oncology

## 2020-03-09 VITALS — BP 131/81 | HR 77 | Temp 97.2°F | Wt 175.8 lb

## 2020-03-09 DIAGNOSIS — D693 Immune thrombocytopenic purpura: Secondary | ICD-10-CM | POA: Diagnosis not present

## 2020-03-09 DIAGNOSIS — N6310 Unspecified lump in the right breast, unspecified quadrant: Secondary | ICD-10-CM | POA: Diagnosis not present

## 2020-03-09 NOTE — Progress Notes (Signed)
Patient here today for follow up, add on visit for area on her chest that she is concerned about. Patient has a mole on her chest that she has been monitoring, states it is growing.

## 2020-03-26 ENCOUNTER — Encounter: Payer: Self-pay | Admitting: Podiatry

## 2020-03-26 ENCOUNTER — Ambulatory Visit: Payer: Medicare HMO | Admitting: Podiatry

## 2020-03-26 ENCOUNTER — Other Ambulatory Visit: Payer: Self-pay

## 2020-03-26 DIAGNOSIS — L989 Disorder of the skin and subcutaneous tissue, unspecified: Secondary | ICD-10-CM

## 2020-03-26 DIAGNOSIS — Q828 Other specified congenital malformations of skin: Secondary | ICD-10-CM

## 2020-03-26 DIAGNOSIS — M79672 Pain in left foot: Secondary | ICD-10-CM

## 2020-03-26 DIAGNOSIS — M79671 Pain in right foot: Secondary | ICD-10-CM

## 2020-03-27 NOTE — Progress Notes (Signed)
   Subjective: 64 y.o. female presenting to the office today for follow up evaluation of bilateral foot pain secondary to symptomatic multiple porokeratotic lesions and diffuse hyperkeratosis.  She had been applying the OTC corn and callus remover in combination with Lotrisone cream with significant improvement, however over the last few weeks she has neglected her feet and the calluses have returned.  She presents for further treatment evaluation.  No new complaints at this time  Past Medical History:  Diagnosis Date  . Hypertension      Objective:  Physical Exam General: Alert and oriented x3 in no acute distress  Dermatology: Hyperkeratotic lesion(s) present on the bilateral feet. Pain on palpation with a central nucleated core noted.  Diffuse hyperkeratosis also noted throughout the right foot.  Multiple porokeratosis with central nucleated cores noted throughout the right foot.  Porokeratosis noted to the plantar aspect of the left hallux.  Skin is warm, dry and supple bilateral lower extremities. Negative for open lesions or macerations.  Vascular: Palpable pedal pulses bilaterally. No edema or erythema noted. Capillary refill within normal limits.  Neurological: Epicritic and protective threshold grossly intact bilaterally.   Musculoskeletal Exam: Pain on palpation at the keratotic lesion(s) noted. Range of motion within normal limits bilateral. Muscle strength 5/5 in all groups bilateral.  Assessment: 1. 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 Patient evaluated.  Patient states that she has not been applying the cream as instructed and the calluses have slowly return 2. Excisional debridement of keratoic lesion(s) using a chisel blade was performed without incident 3.  Discontinue Salinocaine/salicylic acid for the moment. OTC Revitaderm urea 40% lotion provided for the patient today.  Combined with Lotrisone cream daily 4.  Return to clinic as  needed  Edrick Kins, DPM [facility]  Dr. Edrick Kins, DPM    [address], [place] [address]                Office [phone]  Fax [phone]

## 2020-04-07 ENCOUNTER — Telehealth: Payer: Self-pay

## 2020-04-07 NOTE — Telephone Encounter (Signed)
Copied from [place] [id number]. Topic: General - Other >> Apr 07, 2020 11:56 AM Erica Bartlett wrote: Reason for CRM: Pt returned call to office/nurse about scheduling a breast exam or mammogram / please advise

## 2020-04-07 NOTE — Telephone Encounter (Signed)
I called the patient and she said she will call and schedule the mammogram.

## 2020-05-30 ENCOUNTER — Other Ambulatory Visit: Payer: Self-pay | Admitting: Family Medicine

## 2020-05-30 DIAGNOSIS — K219 Gastro-esophageal reflux disease without esophagitis: Secondary | ICD-10-CM

## 2020-06-01 ENCOUNTER — Telehealth: Payer: Medicare HMO | Admitting: Oncology

## 2020-06-01 ENCOUNTER — Inpatient Hospital Stay: Payer: Medicare HMO | Attending: Oncology

## 2020-06-01 DIAGNOSIS — D693 Immune thrombocytopenic purpura: Secondary | ICD-10-CM | POA: Insufficient documentation

## 2020-06-01 LAB — CBC WITH DIFFERENTIAL/PLATELET
Abs Immature Granulocytes: 0.01 10*3/uL (ref 0.00–0.07)
Basophils Absolute: 0.1 10*3/uL (ref 0.0–0.1)
Basophils Relative: 1 %
Eosinophils Absolute: 0.2 10*3/uL (ref 0.0–0.5)
Eosinophils Relative: 3 %
HCT: 43.7 % (ref 36.0–46.0)
Hemoglobin: 15 g/dL (ref 12.0–15.0)
Immature Granulocytes: 0 %
Lymphocytes Relative: 19 %
Lymphs Abs: 1.1 10*3/uL (ref 0.7–4.0)
MCH: 28.9 pg (ref 26.0–34.0)
MCHC: 34.3 g/dL (ref 30.0–36.0)
MCV: 84.2 fL (ref 80.0–100.0)
Monocytes Absolute: 0.4 10*3/uL (ref 0.1–1.0)
Monocytes Relative: 6 %
Neutro Abs: 4.1 10*3/uL (ref 1.7–7.7)
Neutrophils Relative %: 71 %
Platelets: 206 10*3/uL (ref 150–400)
RBC: 5.19 MIL/uL — ABNORMAL HIGH (ref 3.87–5.11)
RDW: 14.6 % (ref 11.5–15.5)
WBC: 5.8 10*3/uL (ref 4.0–10.5)
nRBC: 0 % (ref 0.0–0.2)

## 2020-06-05 NOTE — Progress Notes (Deleted)
[facility]  Telephone:([phone]) [phone] Fax:(336) [phone]  ID: Erica Bartlett OB: May 31, 1955  MR#: [id number]  MOQ#:[id number]  Patient Care Team: Olin Hauser, DO as PCP - General (Family Medicine) Chucky May, MD as Consulting Physician (Psychiatry) Lloyd Huger, MD as Consulting Physician (Oncology) Greg Cutter, LCSW as Social Worker (Licensed Clinical Social Worker)  I connected with Erica Bartlett on 06/05/20 at  2:45 PM EST by {Blank single:19197::"video enabled telemedicine visit","telephone visit"} and verified that I am speaking with the correct person using two identifiers.   I discussed the limitations, risks, security and privacy concerns of performing an evaluation and management service by telemedicine and the availability of in-person appointments. I also discussed with the patient that there may be a patient responsible charge related to this service. The patient expressed understanding and agreed to proceed.   Other persons participating in the visit and their role in the encounter: Patient, MD.  Patient's location: Home. Provider's location: Clinic.  CHIEF COMPLAINT: ITP, right chest wall lesion  INTERVAL HISTORY: Patient returns to clinic today as an add-on for evaluation of an enlarging mole on her right chest wall.  She is anxious, but otherwise feels well.  She continues to have chronic back pain.  She denies any easy bleeding or bruising.  She has no neurologic complaints. She denies any chest pain, shortness of breath, cough, or hemoptysis.  She has no nausea, vomiting, constipation, or diarrhea. She has no urinary complaints.  Patient offers no further specific complaints today.  REVIEW OF SYSTEMS:    Review of Systems  Constitutional: Negative for fever, malaise/fatigue and weight loss.  HENT: Negative.  Negative for congestion.   Respiratory: Negative.  Negative for cough and shortness of breath.    Cardiovascular: Negative.  Negative for chest pain and leg swelling.  Gastrointestinal: Negative.  Negative for blood in stool, melena and nausea.  Genitourinary: Negative.  Negative for hematuria.  Musculoskeletal: Positive for back pain. Negative for joint pain.  Skin: Negative.  Negative for rash.  Neurological: Negative.  Negative for sensory change, focal weakness, weakness and headaches.  Endo/Heme/Allergies: Negative.  Does not bruise/bleed easily.  Psychiatric/Behavioral: Negative.  Negative for depression. The patient is not nervous/anxious and does not have insomnia.     As per HPI. Otherwise, a complete review of systems is negative.  PAST MEDICAL HISTORY: Past Medical History:  Diagnosis Date  . Hypertension     PAST SURGICAL HISTORY: Past Surgical History:  Procedure Laterality Date  . ABDOMINAL HYSTERECTOMY    . BREAST BIOPSY Left    age 71 surgical bx neg  . CESAREAN SECTION    . cyst removed     breast, benign    FAMILY HISTORY: Reviewed and unchanged. No reported history of malignancy or chronic disease.     ADVANCED DIRECTIVES:    HEALTH MAINTENANCE: Social History   Tobacco Use  . Smoking status: Current Some Day Smoker    Packs/day: 0.15    Types: Cigarettes  . Smokeless tobacco: Never Used  . Tobacco comment: 1 pack takes 3.5-4 weeks to finish   Vaping Use  . Vaping Use: Never used  Substance Use Topics  . Alcohol use: No  . Drug use: No    Allergies  Allergen Reactions  . Doxycycline Nausea And Vomiting  . Penicillins Hives    Current Outpatient Medications  Medication Sig Dispense Refill  . acetaminophen (TYLENOL) 500 MG tablet Take 500 mg by mouth every 6 (six)  hours as needed.    Marland Kitchen amLODipine (NORVASC) 10 MG tablet Take 1 tablet (10 mg total) by mouth daily. 90 tablet 3  . amoxicillin-clavulanate (AUGMENTIN) 875-125 MG tablet Take 1 tablet by mouth 2 (two) times daily. 10 days 20 tablet 0  . cetirizine (ZYRTEC) 10 MG tablet Take  1 tablet (10 mg total) by mouth daily. 30 tablet 11  . citalopram (CELEXA) 20 MG tablet Take 20 mg by mouth as needed.     . clonazePAM (KLONOPIN) 1 MG tablet Take 1-2 mg by mouth 3 (three) times daily as needed. Reported on 10/11/2015    . clotrimazole-betamethasone (LOTRISONE) cream Apply 1 application topically 2 (two) times daily. 45 g 2  . cyclobenzaprine (FLEXERIL) 10 MG tablet Take 1 tablet (10 mg total) by mouth 3 (three) times daily as needed for muscle spasms. 60 tablet 2  . diphenhydrAMINE (BENADRYL) 25 mg capsule Take 25 mg by mouth as needed.    . fluticasone (FLONASE) 50 MCG/ACT nasal spray Place 2 sprays into both nostrils daily. 16 g 11  . hydrochlorothiazide (HYDRODIURIL) 25 MG tablet Take 1 tablet (25 mg total) by mouth daily. 90 tablet 3  . ondansetron (ZOFRAN) 4 MG tablet Take 1 tablet (4 mg total) by mouth every 8 (eight) hours as needed for nausea or vomiting. 20 tablet 1  . pantoprazole (PROTONIX) 40 MG tablet TAKE 1 TABLET BY MOUTH DAILY BEFORE BREAKFAST 90 tablet 1  . predniSONE (DELTASONE) 20 MG tablet Take daily with food. Start with 67m (3 pills) x 2 days, then reduce to 49m(2 pills) x 2 days, then 2065m1 pill) x 3 days 13 tablet 0  . VENTOLIN HFA 108 (90 Base) MCG/ACT inhaler INHALE 1-2 PUFFS INTO THE LUNGS EVERY 6 (SIX) HOURS AS NEEDED FOR WHEEZING OR SHORTNESS OF BREATH. 18 g 0   No current facility-administered medications for this visit.    OBJECTIVE: There were no vitals filed for this visit.   There is no height or weight on file to calculate BMI.    ECOG FS:0 - Asymptomatic  General: Well-developed, well-nourished, no acute distress. Eyes: Pink conjunctiva, anicteric sclera. HEENT: Normocephalic, moist mucous membranes. Lungs: No audible wheezing or coughing. Heart: Regular rate and rhythm. Abdomen: Soft, nontender, no obvious distention. Musculoskeletal: No edema, cyanosis, or clubbing. Neuro: Alert, answering all questions appropriately. Cranial  nerves grossly intact. Skin: Oval-shaped nodule dark purple in color on right chest wall less than 1 cm.  Appears benign. Psych: Normal affect.  LAB RESULTS:  Lab Results  Component Value Date   NA 138 08/17/2016   K 3.6 08/17/2016   CL 101 08/17/2016   CO2 25 08/17/2016   GLUCOSE 81 08/17/2016   BUN 11 08/17/2016   CREATININE 0.88 08/17/2016   CALCIUM 9.4 08/17/2016   PROT 7.3 08/17/2016   ALBUMIN 4.0 08/17/2016   AST 25 08/17/2016   ALT 30 (H) 08/17/2016   ALKPHOS 85 08/17/2016   BILITOT 0.3 08/17/2016   GFRNONAA 64 08/04/2015   GFRAA 74 08/04/2015    Lab Results  Component Value Date   WBC 5.8 06/01/2020   NEUTROABS 4.1 06/01/2020   HGB 15.0 06/01/2020   HCT 43.7 06/01/2020   MCV 84.2 06/01/2020   PLT 206 06/01/2020     HEMATOLOGY HISTORY: Bone marrow biopsy performed on July 28, 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 Patient responded to steroids, but the results were not durable.  Patient has received weekly Rituxan x4 with  excellent results each time completing treatment on the following dates:  1.  Aug 30, 2015. 2.  November 23, 2016. 3.  December 20, 2017.   4.  July 23, 2019.  STUDIES: No results found.  ASSESSMENT: Bone marrow biopsy proven ITP, right chest wall lesion.  PLAN:    1.  Right chest wall lesion: Appears benign, but have recommended referral to dermatology for further evaluation. 2.  ITP: Patient's platelet count continues to be within normal limits.  Her last round of Rituxan in March 2021.  Of note, if patient requires Rituxan again, she will need increased premedications  since she has mild reaction with cycle 1.  No intervention is needed at this time.  Return to clinic as previously scheduled for repeat laboratory work and further evaluation. 2.  Leukocytosis: Mild, monitor. 3.  Back pain: Chronic and unchanged.  Continue evaluation and treatment per primary care. 4.  Recurrent congestion/cough: Resolved.   Patient has a normal IgG level of 800. 5.  Foot pain: Patient does not complain of this today.  I provided *** minutes of {Blank single:19197::"face-to-face video visit time","non face-to-face telephone visit time"} during this encounter which included chart review, counseling, and coordination of care as documented above.   Patient expressed understanding and was in agreement with this plan. She also understands that She can call clinic at any time with any questions, concerns, or complaints.   Lloyd Huger, MD   06/05/2020 8:56 AM

## 2020-06-07 ENCOUNTER — Other Ambulatory Visit: Payer: Medicare HMO

## 2020-06-08 ENCOUNTER — Other Ambulatory Visit: Payer: Self-pay

## 2020-06-08 ENCOUNTER — Inpatient Hospital Stay: Payer: Medicare HMO | Admitting: Oncology

## 2020-06-08 ENCOUNTER — Ambulatory Visit: Payer: Medicare HMO | Admitting: Podiatry

## 2020-06-08 ENCOUNTER — Encounter: Payer: Self-pay | Admitting: *Deleted

## 2020-06-08 DIAGNOSIS — M79671 Pain in right foot: Secondary | ICD-10-CM | POA: Diagnosis not present

## 2020-06-08 DIAGNOSIS — Q828 Other specified congenital malformations of skin: Secondary | ICD-10-CM

## 2020-06-08 DIAGNOSIS — D693 Immune thrombocytopenic purpura: Secondary | ICD-10-CM

## 2020-06-08 DIAGNOSIS — M79672 Pain in left foot: Secondary | ICD-10-CM

## 2020-06-08 DIAGNOSIS — L989 Disorder of the skin and subcutaneous tissue, unspecified: Secondary | ICD-10-CM | POA: Diagnosis not present

## 2020-06-08 MED ORDER — HYDROCODONE-ACETAMINOPHEN 5-325 MG PO TABS
1.0000 | ORAL_TABLET | ORAL | 0 refills | Status: DC | PRN
Start: 2020-06-08 — End: 2020-06-22

## 2020-06-08 NOTE — Progress Notes (Signed)
   Subjective: 65 y.o. female presents today for follow up evaluation of porokeratosis of the bilateral feet. She reports some improvement. There are no modifying factors noted. Patient is here for further evaluation and treatment.    Past Medical History:  Diagnosis Date  . Hypertension     Objective: Skin is warm, dry and supple. Nail and respective nail fold appears to be healing appropriately. Open wound to the associated nail fold with a granular wound base and moderate amount of fibrotic tissue. Minimal drainage noted. Mild erythema around the periungual region likely due to phenol chemical matricectomy.  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 Cantharone applied.  #3 Light dressing applied.  #4  Prescription for Vicodin 5/325 mg #5 patient is to return to clinic in 3 weeks.   Edrick Kins, DPM [facility]  Dr. Edrick Kins, DPM    [address], [place] [address]                Office [phone]  Fax [phone]

## 2020-06-22 ENCOUNTER — Other Ambulatory Visit: Payer: Self-pay

## 2020-06-22 ENCOUNTER — Ambulatory Visit (INDEPENDENT_AMBULATORY_CARE_PROVIDER_SITE_OTHER): Payer: Medicare HMO | Admitting: Podiatry

## 2020-06-22 DIAGNOSIS — M79671 Pain in right foot: Secondary | ICD-10-CM

## 2020-06-22 DIAGNOSIS — M79672 Pain in left foot: Secondary | ICD-10-CM | POA: Diagnosis not present

## 2020-06-22 DIAGNOSIS — Q828 Other specified congenital malformations of skin: Secondary | ICD-10-CM

## 2020-06-22 DIAGNOSIS — L989 Disorder of the skin and subcutaneous tissue, unspecified: Secondary | ICD-10-CM | POA: Diagnosis not present

## 2020-06-22 MED ORDER — HYDROCODONE-ACETAMINOPHEN 5-325 MG PO TABS: 1.0000 | ORAL_TABLET | ORAL | 0 refills | Status: DC | PRN

## 2020-06-22 NOTE — Progress Notes (Signed)
   Subjective: 65 y.o. female presents today for follow up evaluation of porokeratosis of the bilateral feet. She reports some improvement. There are no modifying factors noted. Patient is here for further evaluation and treatment.    Past Medical History:  Diagnosis Date  . Hypertension     Objective: Skin is warm, dry and supple. Nail and respective nail fold appears to be healing appropriately. Open wound to the associated nail fold with a granular wound base and moderate amount of fibrotic tissue. Minimal drainage noted. Mild erythema around the periungual region likely due to phenol chemical matricectomy.  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 Cantharone applied.  #3 Light dressing applied.  #4  Prescription for Vicodin 5/325 mg #5  Continue Revitderm 40% urea lotion daily  #6 patient is to return to clinic in 3 weeks.   Edrick Kins, DPM [facility]  Dr. Edrick Kins, DPM    [address], [place] [address]                Office [phone]  Fax [phone]

## 2020-06-26 NOTE — Progress Notes (Signed)
[facility]  [phone] [phone]) [phone]  ID: Teighan Aubert Danish OB: 1956/04/01  MR#: [id number]  LKT#:[id number]  Patient Care Team: Olin Hauser, DO as PCP - General (Family Medicine) Chucky May, MD as Consulting Physician (Psychiatry) Lloyd Huger, MD as Consulting Physician (Oncology) Greg Cutter, LCSW as Social Worker (Licensed Clinical Social Worker)  I connected with Korinna Tat Rodenbeck on 06/30/20 at  3:30 PM EST by video enabled telemedicine visit and verified that I am speaking with the correct person using two identifiers.   I discussed the limitations, risks, security and privacy concerns of performing an evaluation and management service by telemedicine and the availability of in-person appointments. I also discussed with the patient that there may be a patient responsible charge related to this service. The patient expressed understanding and agreed to proceed.   Other persons participating in the visit and their role in the encounter: Patient, MD.  Patients location: Home. Providers location: Clinic.  CHIEF COMPLAINT: ITP.  INTERVAL HISTORY: Patient agreed to video assisted telemedicine visit for further evaluation and discussion of her laboratory results.  She continues to have foot pain and is actively being evaluated and treated by podiatry.  She otherwise feels well. She denies any easy bleeding or bruising.  She has no neurologic complaints. She denies any chest pain, shortness of breath, cough, or hemoptysis.  She has no nausea, vomiting, constipation, or diarrhea. She has no urinary complaints.  Patient offers no further specific complaints today.  REVIEW OF SYSTEMS:    Review of Systems  Constitutional: Negative for fever, malaise/fatigue and weight loss.  HENT: Negative.  Negative for congestion.   Respiratory: Negative.  Negative for cough and shortness of breath.   Cardiovascular: Negative.   Negative for chest pain and leg swelling.  Gastrointestinal: Negative.  Negative for blood in stool, melena and nausea.  Genitourinary: Negative.  Negative for hematuria.  Musculoskeletal: Positive for back pain. Negative for joint pain.  Skin: Negative.  Negative for rash.  Neurological: Negative.  Negative for sensory change, focal weakness, weakness and headaches.  Endo/Heme/Allergies: Negative.  Does not bruise/bleed easily.  Psychiatric/Behavioral: Negative.  Negative for depression. The patient is not nervous/anxious and does not have insomnia.     As per HPI. Otherwise, a complete review of systems is negative.  PAST MEDICAL HISTORY: Past Medical History:  Diagnosis Date   Hypertension     PAST SURGICAL HISTORY: Past Surgical History:  Procedure Laterality Date   ABDOMINAL HYSTERECTOMY     BREAST BIOPSY Left    age 43 surgical bx neg   CESAREAN SECTION     cyst removed     breast, benign    FAMILY HISTORY: Reviewed and unchanged. No reported history of malignancy or chronic disease.     ADVANCED DIRECTIVES:    HEALTH MAINTENANCE: Social History   Tobacco Use   Smoking status: Current Some Day Smoker    Packs/day: 0.15    Types: Cigarettes   Smokeless tobacco: Never Used   Tobacco comment: 1 pack takes 3.5-4 weeks to finish   Vaping Use   Vaping Use: Never used  Substance Use Topics   Alcohol use: No   Drug use: No    Allergies  Allergen Reactions   Doxycycline Nausea And Vomiting   Penicillins Hives    Current Outpatient Medications  Medication Sig Dispense Refill   acetaminophen (TYLENOL) 500 MG tablet Take 500 mg by mouth every 6 (six) hours as needed.  amLODipine (NORVASC) 10 MG tablet Take 1 tablet (10 mg total) by mouth daily. 90 tablet 3   cetirizine (ZYRTEC) 10 MG tablet Take 1 tablet (10 mg total) by mouth daily. 30 tablet 11   clonazePAM (KLONOPIN) 1 MG tablet Take 1-2 mg by mouth 3 (three) times daily as needed.  Reported on 10/11/2015     clotrimazole-betamethasone (LOTRISONE) cream Apply 1 application topically 2 (two) times daily. (Patient not taking: Reported on 06/29/2020) 45 g 2   cyclobenzaprine (FLEXERIL) 10 MG tablet Take 1 tablet (10 mg total) by mouth 3 (three) times daily as needed for muscle spasms. (Patient not taking: Reported on 06/29/2020) 60 tablet 2   diphenhydrAMINE (BENADRYL) 25 mg capsule Take 25 mg by mouth as needed.     fluticasone (FLONASE) 50 MCG/ACT nasal spray Place 2 sprays into both nostrils daily. (Patient not taking: Reported on 06/29/2020) 16 g 11   hydrochlorothiazide (HYDRODIURIL) 25 MG tablet Take 1 tablet (25 mg total) by mouth daily. (Patient not taking: Reported on 06/29/2020) 90 tablet 3   HYDROcodone-acetaminophen (NORCO/VICODIN) 5-325 MG tablet Take 1 tablet by mouth every 4 (four) hours as needed for moderate pain. (Patient not taking: Reported on 06/29/2020) 30 tablet 0   ondansetron (ZOFRAN) 4 MG tablet Take 1 tablet (4 mg total) by mouth every 8 (eight) hours as needed for nausea or vomiting. (Patient not taking: Reported on 06/29/2020) 20 tablet 1   pantoprazole (PROTONIX) 40 MG tablet TAKE 1 TABLET BY MOUTH DAILY BEFORE BREAKFAST (Patient not taking: Reported on 06/29/2020) 90 tablet 1   predniSONE (DELTASONE) 20 MG tablet Take daily with food. Start with 57m (3 pills) x 2 days, then reduce to 447m(2 pills) x 2 days, then 2080m1 pill) x 3 days (Patient not taking: Reported on 06/29/2020) 13 tablet 0   VENTOLIN HFA 108 (90 Base) MCG/ACT inhaler INHALE 1-2 PUFFS INTO THE LUNGS EVERY 6 (SIX) HOURS AS NEEDED FOR WHEEZING OR SHORTNESS OF BREATH. (Patient not taking: Reported on 06/29/2020) 18 g 0   No current facility-administered medications for this visit.    OBJECTIVE: There were no vitals filed for this visit.   There is no height or weight on file to calculate BMI.    ECOG FS:0 - Asymptomatic  General: Well-developed, well-nourished, no acute distress. HEENT:  Normocephalic. Neuro: Alert, answering all questions appropriately. Cranial nerves grossly intact. Psych: Normal affect.  LAB RESULTS:  Lab Results  Component Value Date   NA 138 08/17/2016   K 3.6 08/17/2016   CL 101 08/17/2016   CO2 25 08/17/2016   GLUCOSE 81 08/17/2016   BUN 11 08/17/2016   CREATININE 0.88 08/17/2016   CALCIUM 9.4 08/17/2016   PROT 7.3 08/17/2016   ALBUMIN 4.0 08/17/2016   AST 25 08/17/2016   ALT 30 (H) 08/17/2016   ALKPHOS 85 08/17/2016   BILITOT 0.3 08/17/2016   GFRNONAA 64 08/04/2015   GFRAA 74 08/04/2015    Lab Results  Component Value Date   WBC 5.8 06/01/2020   NEUTROABS 4.1 06/01/2020   HGB 15.0 06/01/2020   HCT 43.7 06/01/2020   MCV 84.2 06/01/2020   PLT 206 06/01/2020     HEMATOLOGY HISTORY: Bone marrow biopsy performed on July 28, 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 Patient responded to steroids, but the results were not durable.  Patient has received weekly Rituxan x4 with excellent results each time completing treatment on the following dates:  1.  Aug 30, 2015.  2.  November 23, 2016. 3.  December 20, 2017.   4.  July 23, 2019.  STUDIES: No results found.  ASSESSMENT: Bone marrow biopsy proven ITP.  PLAN:    1.  ITP: Patient's platelet count continues to be within normal limits.  She last received treatment with Rituxan in March 2021.  If patient requires Rituxan again, she will need increased premedications  since she has mild reaction with cycle 1.  No intervention is needed at this time.  Return to clinic in 3 months for laboratory work only and then in 6 months for laboratory work and further evaluation. 2.  Leukocytosis: Resolved. 3.  Back pain: Chronic and unchanged.  Continue evaluation and treatment per primary care. 4.  Foot pain: Continue follow-up with podiatry as scheduled. 5.  Right chest wall lesion: Patient was previously given a referral to dermatology.   I provided 20 minutes of  face-to-face video visit time during this encounter which included chart review, counseling, and coordination of care as documented above.   Patient expressed understanding and was in agreement with this plan. She also understands that She can call clinic at any time with any questions, concerns, or complaints.   Lloyd Huger, MD   06/30/2020 6:46 AM

## 2020-06-29 ENCOUNTER — Inpatient Hospital Stay: Payer: Medicare HMO | Attending: Oncology | Admitting: Oncology

## 2020-06-29 DIAGNOSIS — D693 Immune thrombocytopenic purpura: Secondary | ICD-10-CM | POA: Diagnosis not present

## 2020-07-08 ENCOUNTER — Ambulatory Visit: Payer: Self-pay | Admitting: *Deleted

## 2020-07-08 DIAGNOSIS — R112 Nausea with vomiting, unspecified: Secondary | ICD-10-CM

## 2020-07-08 MED ORDER — ONDANSETRON HCL 4 MG PO TABS: 4.0000 mg | ORAL_TABLET | Freq: Three times a day (TID) | ORAL | 1 refills | Status: DC | PRN

## 2020-07-08 NOTE — Telephone Encounter (Signed)
  Pt called and is requesting to have a refill on her nausea medication,but cannot remember name of the medication. Please advise  Requesting zofran 4 mg po for nausea. C/o "stomach bug" and nausea. Denies vomiting , severe abdominal pain, fever, diarrhea at this time. Patient reports she is able to eat and drink. Care advise given. Patient verbalized understanding of care advise and to call back or go to ED if symptoms worsen.   Reason for Disposition . [1] Prescription refill request for NON-ESSENTIAL medicine (i.e., no harm to patient if med not taken) AND [2] triager unable to refill per department policy  Answer Assessment - Initial Assessment Questions 1. DRUG NAME: "What medicine do you need to have refilled?"     Zofran 4 mg po  2. REFILLS REMAINING: "How many refills are remaining?" (Note: The label on the medicine or pill bottle will show how many refills are remaining. If there are no refills remaining, then a renewal may be needed.)     1 3. EXPIRATION DATE: "What is the expiration date?" (Note: The label states when the prescription will expire, and thus can no longer be refilled.)     na 4. PRESCRIBING HCP: "Who prescribed it?" Reason: If prescribed by , call should be referred to that group.     PCP 5. SYMPTOMS: "Do you have any symptoms?"     Nausea, possible "stomach bug" 6. PREGNANCY: "Is there any chance that you are pregnant?" "When was your last menstrual period?"     na  Protocols used: MEDICATION REFILL AND RENEWAL CALL-A-AH

## 2020-07-13 ENCOUNTER — Other Ambulatory Visit: Payer: Self-pay

## 2020-07-13 ENCOUNTER — Ambulatory Visit: Payer: Medicare HMO | Admitting: Podiatry

## 2020-07-13 DIAGNOSIS — M79672 Pain in left foot: Secondary | ICD-10-CM

## 2020-07-13 DIAGNOSIS — M79671 Pain in right foot: Secondary | ICD-10-CM

## 2020-07-13 DIAGNOSIS — L989 Disorder of the skin and subcutaneous tissue, unspecified: Secondary | ICD-10-CM

## 2020-07-13 MED ORDER — FLUOROURACIL 5 % EX CREA: TOPICAL_CREAM | Freq: Two times a day (BID) | CUTANEOUS | 2 refills | Status: DC

## 2020-07-13 NOTE — Progress Notes (Signed)
   Subjective: 65 y.o. female presents today for follow up evaluation of porokeratosis of the bilateral feet. She reports some improvement. There are no modifying factors noted. Patient is here for further evaluation and treatment.    Past Medical History:  Diagnosis Date  . Hypertension     Objective: Skin is warm, dry and supple. Nail and respective nail fold appears to be healing appropriately. Open wound to the associated nail fold with a granular wound base and moderate amount of fibrotic tissue. Minimal drainage noted. Mild erythema around the periungual region likely due to phenol chemical matricectomy.  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  #3 Light dressing applied.  #4  Continue Revitderm 40% urea lotion daily  #6  Prescription for Efudex topical solution  #7 patient is to return to clinic in 4 weeks   Edrick Kins, DPM [facility]  Dr. Edrick Kins, DPM    [address], [place] [address]                Office [phone]  Fax [phone]

## 2020-07-19 ENCOUNTER — Telehealth: Payer: Self-pay | Admitting: Family Medicine

## 2020-07-19 NOTE — Telephone Encounter (Signed)
Left message for patient to call back and schedule the Medicare Annual Wellness Visit (AWV) virtually or by telephone.  Last AWV 12/17/2018  Please schedule at anytime with [facility].  40 minute appointment  Any questions, please call me at [phone]

## 2020-07-19 NOTE — Telephone Encounter (Signed)
Patient is going into a meeting and will call back to schedule the below.

## 2020-08-13 ENCOUNTER — Other Ambulatory Visit: Payer: Self-pay

## 2020-08-13 ENCOUNTER — Ambulatory Visit: Payer: Medicare HMO | Admitting: Podiatry

## 2020-08-13 DIAGNOSIS — L989 Disorder of the skin and subcutaneous tissue, unspecified: Secondary | ICD-10-CM

## 2020-08-13 DIAGNOSIS — M79672 Pain in left foot: Secondary | ICD-10-CM | POA: Diagnosis not present

## 2020-08-13 DIAGNOSIS — M79671 Pain in right foot: Secondary | ICD-10-CM

## 2020-08-19 ENCOUNTER — Encounter: Payer: Self-pay | Admitting: Family Medicine

## 2020-08-19 ENCOUNTER — Telehealth (INDEPENDENT_AMBULATORY_CARE_PROVIDER_SITE_OTHER): Payer: Medicare HMO | Admitting: Family Medicine

## 2020-08-19 ENCOUNTER — Other Ambulatory Visit: Payer: Self-pay

## 2020-08-19 VITALS — Ht 64.0 in | Wt 175.0 lb

## 2020-08-19 DIAGNOSIS — R112 Nausea with vomiting, unspecified: Secondary | ICD-10-CM

## 2020-08-19 DIAGNOSIS — J0110 Acute frontal sinusitis, unspecified: Secondary | ICD-10-CM

## 2020-08-19 MED ORDER — AMOXICILLIN-POT CLAVULANATE 875-125 MG PO TABS: 1.0000 | ORAL_TABLET | Freq: Two times a day (BID) | ORAL | 0 refills | Status: DC

## 2020-08-19 MED ORDER — ONDANSETRON HCL 4 MG PO TABS
4.0000 mg | ORAL_TABLET | Freq: Three times a day (TID) | ORAL | 3 refills | Status: DC | PRN
Start: 2020-08-19 — End: 2021-02-15

## 2020-08-19 MED ORDER — PREDNISONE 20 MG PO TABS: ORAL_TABLET | ORAL | 0 refills | Status: DC

## 2020-08-19 NOTE — Progress Notes (Signed)
Virtual Visit via Telephone The purpose of this virtual visit is to provide medical care while limiting exposure to the novel coronavirus (COVID19) for both patient and office staff.  Consent was obtained for phone visit:  Yes.   Answered questions that patient had about telehealth interaction:  Yes.   I discussed the limitations, risks, security and privacy concerns of performing an evaluation and management service by telephone. I also discussed with the patient that there may be a patient responsible charge related to this service. The patient expressed understanding and agreed to proceed.  Patient Location: Home Provider Location: [facility] (Office)  Participants in virtual visit: - Patient: Erica Bartlett - CMA: Orinda Kenner, CMA - Provider: Dr Parks Ranger  ---------------------------------------------------------------------- Chief Complaint  Patient presents with  . Sinusitis    Sinus pressure, drainage. Cough- yellow mucous, sneezing, and wheezing. Taking tylenol every 6 hrs - unsure of fever before tylenol. Needs refill on Zofran. Wants prednisone. Started 2 days ago - no test for Covid.     S: Reviewed CMA documentation. I have called patient and gathered additional HPI as follows:  Acute Sinusitis Prior visits for sinusitis / history of compromised immune system. She said that recently grandchildren exposed to covid / illness but no active symptoms or sickness. Now with acute onset sinus pressure pain drainage, thicker yellow drainage, occasional wheezing with cough. Previuosly improved on steroid before Last sinusitis 02/2020   Denies any known or suspected exposure to person with or possibly with COVID19.  Denies any chills, sweats, body ache, cough, shortness of breath, abdominal pain, diarrhea  Past Medical History:  Diagnosis Date  . Hypertension    Social History   Tobacco Use  . Smoking status: Current Some Day Smoker     Packs/day: 0.15    Types: Cigarettes  . Smokeless tobacco: Never Used  . Tobacco comment: 1 pack takes 3.5-4 weeks to finish   Vaping Use  . Vaping Use: Never used  Substance Use Topics  . Alcohol use: No  . Drug use: No    Current Outpatient Medications:  .  acetaminophen (TYLENOL) 500 MG tablet, Take 500 mg by mouth every 6 (six) hours as needed., Disp: , Rfl:  .  amLODipine (NORVASC) 10 MG tablet, Take 1 tablet (10 mg total) by mouth daily., Disp: 90 tablet, Rfl: 3 .  amoxicillin-clavulanate (AUGMENTIN) 875-125 MG tablet, Take 1 tablet by mouth 2 (two) times daily., Disp: 20 tablet, Rfl: 0 .  cetirizine (ZYRTEC) 10 MG tablet, Take 1 tablet (10 mg total) by mouth daily., Disp: 30 tablet, Rfl: 11 .  clonazePAM (KLONOPIN) 1 MG tablet, Take 1-2 mg by mouth 3 (three) times daily as needed. Reported on 10/11/2015, Disp: , Rfl:  .  clotrimazole-betamethasone (LOTRISONE) cream, Apply 1 application topically 2 (two) times daily., Disp: 45 g, Rfl: 2 .  diphenhydrAMINE (BENADRYL) 25 mg capsule, Take 25 mg by mouth as needed., Disp: , Rfl:  .  fluorouracil (EFUDEX) 5 % cream, Apply topically 2 (two) times daily., Disp: 40 g, Rfl: 2 .  fluticasone (FLONASE) 50 MCG/ACT nasal spray, Place 2 sprays into both nostrils daily., Disp: 16 g, Rfl: 11 .  hydrochlorothiazide (HYDRODIURIL) 25 MG tablet, Take 1 tablet (25 mg total) by mouth daily., Disp: 90 tablet, Rfl: 3 .  HYDROcodone-acetaminophen (NORCO/VICODIN) 5-325 MG tablet, Take 1 tablet by mouth every 4 (four) hours as needed for moderate pain., Disp: 30 tablet, Rfl: 0 .  pantoprazole (PROTONIX) 40 MG tablet, TAKE  1 TABLET BY MOUTH DAILY BEFORE BREAKFAST, Disp: 90 tablet, Rfl: 1 .  predniSONE (DELTASONE) 20 MG tablet, Take daily with food. Start with 60mg  (3 pills) x 2 days, then reduce to 40mg  (2 pills) x 2 days, then 20mg  (1 pill) x 3 days, Disp: 13 tablet, Rfl: 0 .  VENTOLIN HFA 108 (90 Base) MCG/ACT inhaler, INHALE 1-2 PUFFS INTO THE LUNGS EVERY 6  (SIX) HOURS AS NEEDED FOR WHEEZING OR SHORTNESS OF BREATH., Disp: 18 g, Rfl: 0 .  ondansetron (ZOFRAN) 4 MG tablet, Take 1 tablet (4 mg total) by mouth every 8 (eight) hours as needed for nausea or vomiting., Disp: 20 tablet, Rfl: 3  Depression screen [facility] 2/9 08/19/2020 06/17/2019 03/28/2019  Decreased Interest 0 0 0  Down, Depressed, Hopeless 0 0 0  PHQ - 2 Score 0 0 0  Altered sleeping - - -  Tired, decreased energy - - -  Change in appetite - - -  Feeling bad or failure about yourself  - - -  Trouble concentrating - - -  Moving slowly or fidgety/restless - - -  Suicidal thoughts - - -  PHQ-9 Score - - -  Difficult doing work/chores Not difficult at all - -    GAD 7 : Generalized Anxiety Score 10/15/2018 08/26/2018  Nervous, Anxious, on Edge 1 1  Control/stop worrying 1 1  Worry too much - different things 1 1  Trouble relaxing 2 0  Restless 0 0  Easily annoyed or irritable 0 1  Afraid - awful might happen 1 1  Total GAD 7 Score 6 5  Anxiety Difficulty Not difficult at all Not difficult at all    -------------------------------------------------------------------------- O: No physical exam performed due to remote telephone encounter.  Lab results reviewed.  Recent Results (from the past 2160 hour(s))  CBC with Differential/Platelet     Status: Abnormal   Collection Time: 06/01/20 10:53 AM  Result Value Ref Range   WBC 5.8 4.0 - 10.5 K/uL   RBC 5.19 (H) 3.87 - 5.11 MIL/uL   Hemoglobin 15.0 12.0 - 15.0 g/dL   HCT 43.7 36.0 - 46.0 %   MCV 84.2 80.0 - 100.0 fL   MCH 28.9 26.0 - 34.0 pg   MCHC 34.3 30.0 - 36.0 g/dL   RDW 14.6 11.5 - 15.5 %   Platelets 206 150 - 400 K/uL   nRBC 0.0 0.0 - 0.2 %   Neutrophils Relative % 71 %   Neutro Abs 4.1 1.7 - 7.7 K/uL   Lymphocytes Relative 19 %   Lymphs Abs 1.1 0.7 - 4.0 K/uL   Monocytes Relative 6 %   Monocytes Absolute 0.4 0.1 - 1.0 K/uL   Eosinophils Relative 3 %   Eosinophils Absolute 0.2 0.0 - 0.5 K/uL   Basophils Relative 1 %    Basophils Absolute 0.1 0.0 - 0.1 K/uL   Immature Granulocytes 0 %   Abs Immature Granulocytes 0.01 0.00 - 0.07 K/uL    Comment: Performed at [facility], [address] [place], [place] [address]    -------------------------------------------------------------------------- A&P:  Problem List Items Addressed This Visit   None   Visit Diagnoses    Acute non-recurrent frontal sinusitis    -  Primary   Relevant Medications   amoxicillin-clavulanate (AUGMENTIN) 875-125 MG tablet   predniSONE (DELTASONE) 20 MG tablet   Intractable vomiting with nausea, unspecified vomiting type       Relevant Medications   ondansetron (ZOFRAN) 4 MG tablet  Consistent with acute frontal sinusitis, likely initially viral URI vs allergic rhinitis component with worsening concern for bacterial infection given immunocompromised history  Plan: 1. Reassurance, likely self-limited - no indication for antibiotics at this time - however will order optional Augmentin antibiotic BID x 10 days, can start or wait 48 hours. 2. Start Prednisone taper 3. Continue allergy sinus meds Return criteria reviewed    Meds ordered this encounter  Medications  . amoxicillin-clavulanate (AUGMENTIN) 875-125 MG tablet    Sig: Take 1 tablet by mouth 2 (two) times daily.    Dispense:  20 tablet    Refill:  0  . predniSONE (DELTASONE) 20 MG tablet    Sig: Take daily with food. Start with 60mg  (3 pills) x 2 days, then reduce to 40mg  (2 pills) x 2 days, then 20mg  (1 pill) x 3 days    Dispense:  13 tablet    Refill:  0  . ondansetron (ZOFRAN) 4 MG tablet    Sig: Take 1 tablet (4 mg total) by mouth every 8 (eight) hours as needed for nausea or vomiting.    Dispense:  20 tablet    Refill:  3    Follow-up: PRN  Patient verbalizes understanding with the above medical recommendations including the limitation of remote medical advice.  Specific follow-up and call-back criteria were given for patient to follow-up  or seek medical care more urgently if needed.   - Time spent in direct consultation with patient on phone: 7 minutes   [name], [facility] 08/19/2020, 11:25 AM

## 2020-08-26 ENCOUNTER — Telehealth: Payer: Self-pay | Admitting: Oncology

## 2020-08-26 ENCOUNTER — Other Ambulatory Visit: Payer: Self-pay

## 2020-08-26 DIAGNOSIS — D693 Immune thrombocytopenic purpura: Secondary | ICD-10-CM

## 2020-08-26 NOTE — Telephone Encounter (Signed)
Patient left vm requesting an appointment as soon as possible as something is going on.  She states she knows she need blood work done.  Routing to team for follow up and scheduling directions.

## 2020-08-26 NOTE — Telephone Encounter (Signed)
Cbc with diff and platellet ordered and done dc

## 2020-08-27 ENCOUNTER — Other Ambulatory Visit: Payer: Self-pay

## 2020-08-27 ENCOUNTER — Inpatient Hospital Stay: Payer: Medicare HMO | Attending: Oncology

## 2020-08-27 DIAGNOSIS — D693 Immune thrombocytopenic purpura: Secondary | ICD-10-CM | POA: Diagnosis not present

## 2020-08-27 LAB — CBC WITH DIFFERENTIAL/PLATELET
Abs Immature Granulocytes: 0.04 10*3/uL (ref 0.00–0.07)
Basophils Absolute: 0.1 10*3/uL (ref 0.0–0.1)
Basophils Relative: 1 %
Eosinophils Absolute: 0.4 10*3/uL (ref 0.0–0.5)
Eosinophils Relative: 5 %
HCT: 43.4 % (ref 36.0–46.0)
Hemoglobin: 14.6 g/dL (ref 12.0–15.0)
Immature Granulocytes: 1 %
Lymphocytes Relative: 21 %
Lymphs Abs: 1.5 10*3/uL (ref 0.7–4.0)
MCH: 28.5 pg (ref 26.0–34.0)
MCHC: 33.6 g/dL (ref 30.0–36.0)
MCV: 84.6 fL (ref 80.0–100.0)
Monocytes Absolute: 0.4 10*3/uL (ref 0.1–1.0)
Monocytes Relative: 5 %
Neutro Abs: 4.8 10*3/uL (ref 1.7–7.7)
Neutrophils Relative %: 67 %
Platelets: 146 10*3/uL — ABNORMAL LOW (ref 150–400)
RBC: 5.13 MIL/uL — ABNORMAL HIGH (ref 3.87–5.11)
RDW: 14.4 % (ref 11.5–15.5)
WBC: 7 10*3/uL (ref 4.0–10.5)
nRBC: 0 % (ref 0.0–0.2)

## 2020-08-29 NOTE — Progress Notes (Signed)
   Subjective: 65 y.o. female presents today for follow up evaluation of porokeratosis of the bilateral feet. She reports some improvement. There are no modifying factors noted. Patient is here for further evaluation and treatment.    Past Medical History:  Diagnosis Date  . Hypertension     Objective: Skin is warm, dry and supple. Nail and respective nail fold appears to be healing appropriately. Open wound to the associated nail fold with a granular wound base and moderate amount of fibrotic tissue. Minimal drainage noted. Mild erythema around the periungual region likely due to phenol chemical matricectomy.  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  #3 Light dressing applied with salicylic acid #4  Continue Revitderm 40% urea lotion daily  #6  Prescription for Efudex topical solution was declined by insurance #7 patient is to return to clinic as needed   Edrick Kins, DPM [facility]  Dr. Edrick Kins, DPM    [address], [place] [address]                Office [phone]  Fax [phone]

## 2020-08-30 ENCOUNTER — Ambulatory Visit: Payer: Medicare HMO | Admitting: Dermatology

## 2020-08-30 ENCOUNTER — Other Ambulatory Visit: Payer: Self-pay

## 2020-08-30 DIAGNOSIS — D696 Thrombocytopenia, unspecified: Secondary | ICD-10-CM

## 2020-08-30 DIAGNOSIS — L720 Epidermal cyst: Secondary | ICD-10-CM | POA: Diagnosis not present

## 2020-08-30 NOTE — Progress Notes (Signed)
   New Patient Visit  Subjective  Erica Bartlett is a 65 y.o. female who presents for the following: Lesion (On the R clavicle - has been there for years but started growing larger over the past year).  The following portions of the chart were reviewed this encounter and updated as appropriate:   Tobacco  Allergies  Meds  Problems  Med Hx  Surg Hx  Fam Hx     Review of Systems:  No other skin or systemic complaints except as noted in HPI or Assessment and Plan.  Objective  Well appearing patient in no apparent distress; mood and affect are within normal limits.  A focused examination was performed including the trunk. Relevant physical exam findings are noted in the Assessment and Plan.  Objective  R med clavicle: Firm SQ nodule 1.0 cm   Assessment & Plan  Epidermal inclusion cyst R med clavicle Discussed benign skin lesion and excision if bothersome - may grow larger over time without treatment. Discussed surgical option.  Pt will schedule.  Thrombocytopenia ([place]) Internal Discussed with patient that so long as her Platelet count is above 100,000 - surgery should be safe to perform.   Return for surgery - cyst .  I, Rudell Cobb, CMA, am acting as scribe for Sarina Ser, MD .  Documentation: I have reviewed the above documentation for accuracy and completeness, and I agree with the above.  Sarina Ser, MD

## 2020-08-30 NOTE — Patient Instructions (Addendum)
If you have any questions or concerns for your doctor, please call our main line at [phone] and press option 4 to reach your doctor's medical assistant. If no one answers, please leave a voicemail as directed and we will return your call as soon as possible. Messages left after 4 pm will be answered the following business day.   You may also send us a message via MyChart. We typically respond to MyChart messages within 1-2 business days.  For prescription refills, please ask your pharmacy to contact our office. Our fax number is [phone].  If you have an urgent issue when the clinic is closed that cannot wait until the next business day, you can page your doctor at the number below.    Please note that while we do our best to be available for urgent issues outside of office hours, we are not available 24/7.   If you have an urgent issue and are unable to reach us, you may choose to seek medical care at your doctor's office, retail clinic, urgent care center, or emergency room.  If you have a medical emergency, please immediately call 911 or go to the emergency department.  Pager Numbers  - Dr. Kowalski: [phone]  - Dr. Moye: [phone]  - Dr. Stewart: [phone]  In the event of inclement weather, please call our main line at [phone] for an update on the status of any delays or closures.  Dermatology Medication Tips: Please keep the boxes that topical medications come in in order to help keep track of the instructions about where and how to use these. Pharmacies typically print the medication instructions only on the boxes and not directly on the medication tubes.   If your medication is too expensive, please contact our office at [phone] option 4 or send us a message through MyChart.   We are unable to tell what your co-pay for medications will be in advance as this is different depending on your insurance coverage. However, we may be able to find a substitute  medication at lower cost or fill out paperwork to get insurance to cover a needed medication.   If a prior authorization is required to get your medication covered by your insurance company, please allow us 1-2 business days to complete this process.  Drug prices often vary depending on where the prescription is filled and some pharmacies may offer cheaper prices.  The website www.goodrx.com contains coupons for medications through different pharmacies. The prices here do not account for what the cost may be with help from insurance (it may be cheaper with your insurance), but the website can give you the price if you did not use any insurance.  - You can print the associated coupon and take it with your prescription to the pharmacy.  - You may also stop by our office during regular business hours and pick up a GoodRx coupon card.  - If you need your prescription sent electronically to a different pharmacy, notify our office through [place] MyChart or by phone at [phone] option 4.    Pre-Operative Instructions  You are scheduled for a surgical procedure at [facility]. We recommend you read the following instructions. If you have any questions or concerns, please call the office at [phone].  1. Shower and wash the entire body with soap and water the day of your surgery paying special attention to cleansing at and around the planned surgery site.  2. Avoid aspirin or aspirin containing products at least   fourteen (14) days prior to your surgical procedure and for at least one week (7 Days) after your surgical procedure. If you take aspirin on a regular basis for heart disease or history of stroke or for any other reason, we may recommend you continue taking aspirin but please notify us if you take this on a regular basis. Aspirin can cause more bleeding to occur during surgery as well as prolonged bleeding and bruising after surgery.   3. Avoid other nonsteroidal pain  medications at least one week prior to surgery and at least one week prior to your surgery. These include medications such as Ibuprofen (Motrin, Advil and Nuprin), Naprosyn, Voltaren, Relafen, etc. If medications are used for therapeutic reasons, please inform us as they can cause increased bleeding or prolonged bleeding during and bruising after surgical procedures.   4. Please advise us if you are taking any "blood thinner" medications such as Coumadin or Dipyridamole or Plavix or similar medications. These cause increased bleeding and prolonged bleeding during procedures and bruising after surgical procedures. We may have to consider discontinuing these medications briefly prior to and shortly after your surgery if safe to do so.   5. Please inform us of all medications you are currently taking. All medications that are taken regularly should be taken the day of surgery as you always do. Nevertheless, we need to be informed of what medications you are taking prior to surgery to know whether they will affect the procedure or cause any complications.   6. Please inform us of any medication allergies. Also inform us of whether you have allergies to Latex or rubber products or whether you have had any adverse reaction to Lidocaine or Epinephrine.  7. Please inform us of any prosthetic or artificial body parts such as artificial heart valve, joint replacements, etc., or similar condition that might require preoperative antibiotics.   8. We recommend avoidance of alcohol at least two weeks prior to surgery and continued avoidance for at least two weeks after surgery.   9. We recommend discontinuation of tobacco smoking at least two weeks prior to surgery and continued abstinence for at least two weeks after surgery.  10. Do not plan strenuous exercise, strenuous work or strenuous lifting for approximately four weeks after your surgery.   11. We request if you are unable to make your scheduled surgical  appointment, please call us at least a week in advance or as soon as you are aware of a problem so that we can cancel or reschedule the appointment.   12. You MAY TAKE TYLENOL (acetaminophen) for pain as it is not a blood thinner.   13. PLEASE PLAN TO BE IN TOWN FOR TWO WEEKS FOLLOWING SURGERY, THIS IS IMPORTANT SO YOU CAN BE CHECKED FOR DRESSING CHANGES, SUTURE REMOVAL AND TO MONITOR FOR POSSIBLE COMPLICATIONS.  

## 2020-09-01 ENCOUNTER — Encounter: Payer: Self-pay | Admitting: Dermatology

## 2020-09-17 ENCOUNTER — Other Ambulatory Visit: Payer: Self-pay

## 2020-09-17 ENCOUNTER — Encounter: Payer: Self-pay | Admitting: Podiatry

## 2020-09-17 ENCOUNTER — Ambulatory Visit: Payer: Medicare HMO | Admitting: Podiatry

## 2020-09-17 DIAGNOSIS — B070 Plantar wart: Secondary | ICD-10-CM

## 2020-09-17 MED ORDER — OXYCODONE-ACETAMINOPHEN 5-325 MG PO TABS: 1.0000 | ORAL_TABLET | Freq: Four times a day (QID) | ORAL | 0 refills | Status: DC | PRN

## 2020-09-17 NOTE — Progress Notes (Signed)
   Subjective: 65 y.o. female presenting for follow-up evaluation of painful symptomatic skin lesions to the weightbearing surface of the bilateral feet.  Patient has tried multiple topical treatments and she states that the [facility] is the most effective in alleviating the symptoms.  She presents for further treatment and evaluation.  Overall she does state there is improvement    Past Medical History:  Diagnosis Date  . Hypertension     Objective: Physical Exam General: The patient is alert and oriented x3 in no acute distress.   Dermatology: Multiple hyperkeratotic skin lesions noted to the plantar aspect of the bilateral feet approximately 1 cm in diameter. Pinpoint bleeding noted upon debridement. Skin is warm, dry and supple bilateral lower extremities. Negative for open lesions or macerations.   Vascular: Palpable pedal pulses bilaterally. No edema or erythema noted. Capillary refill within normal limits.   Neurological: Epicritic and protective threshold grossly intact bilaterally.    Musculoskeletal Exam:  No pedal deformities noted   Assessment: 1. plantar wart bilateral feet    Plan of Care:  1. Patient was evaluated. 2. Excisional debridement of the plantar wart lesion(s) was performed using a chisel blade. Cantharone was applied and the lesion(s) was dressed with a dry sterile dressing. 3.  In the past there was a prescription for Efudex topical solution but this was declined by insurance  4.  Patient is to return to clinic in 2 weeks  Edrick Kins, DPM [facility]  Dr. Edrick Kins, DPM    [address], [place] [address]                Office [phone]  Fax [phone]

## 2020-09-23 ENCOUNTER — Telehealth: Payer: Medicare HMO | Admitting: Physician Assistant

## 2020-09-23 ENCOUNTER — Telehealth: Payer: Self-pay | Admitting: Family Medicine

## 2020-09-23 ENCOUNTER — Ambulatory Visit: Payer: Self-pay

## 2020-09-23 ENCOUNTER — Encounter: Payer: Self-pay | Admitting: Physician Assistant

## 2020-09-23 DIAGNOSIS — U071 COVID-19: Secondary | ICD-10-CM | POA: Diagnosis not present

## 2020-09-23 MED ORDER — BENZONATATE 100 MG PO CAPS: 100.0000 mg | ORAL_CAPSULE | Freq: Three times a day (TID) | ORAL | 0 refills | Status: DC | PRN

## 2020-09-23 MED ORDER — MOLNUPIRAVIR EUA 200MG CAPSULE: 4.0000 | ORAL_CAPSULE | Freq: Two times a day (BID) | ORAL | 0 refills | Status: AC

## 2020-09-23 NOTE — Patient Instructions (Signed)
Instructions sent to patients MyChart.

## 2020-09-23 NOTE — Telephone Encounter (Signed)
error 

## 2020-09-23 NOTE — Progress Notes (Signed)
Patient referred to video visit for anti-viral medications

## 2020-09-23 NOTE — Telephone Encounter (Signed)
Patient calling about getting medication for covid. She is experiencing, a bad cough, headache, congestion, hot and cold feelings. She took a test on 09/22/20. Started feeling Monday. Home test positive. Has cough,congestion,headache, body aches, chills. No availability in the practice. Pt. Will do My Chart e-visit. Reason for Disposition . MILD difficulty breathing (e.g., minimal/no SOB at rest, SOB with walking, pulse <100)  Answer Assessment - Initial Assessment Questions 1. COVID-19 DIAGNOSIS: "Who made your COVID-19 diagnosis?" "Was it confirmed by a positive lab test or self-test?" If not diagnosed by a doctor (or NP/PA), ask "Are there lots of cases (community spread) where you live?" Note: See public health department website, if unsure.     Home test 2. COVID-19 EXPOSURE: "Was there any known exposure to COVID before the symptoms began?" CDC Definition of close contact: within 6 feet (2 meters) for a total of 15 minutes or more over a 24-hour period.      Yes 3. ONSET: "When did the COVID-19 symptoms start?"      Monday 4. WORST SYMPTOM: "What is your worst symptom?" (e.g., cough, fever, shortness of breath, muscle aches)     Cough, congestion 5. COUGH: "Do you have a cough?" If Yes, ask: "How bad is the cough?"       Yes 6. FEVER: "Do you have a fever?" If Yes, ask: "What is your temperature, how was it measured, and when did it start?"     Chills 7. RESPIRATORY STATUS: "Describe your breathing?" (e.g., shortness of breath, wheezing, unable to speak)      No 8. BETTER-SAME-WORSE: "Are you getting better, staying the same or getting worse compared to yesterday?"  If getting worse, ask, "In what way?"     Worse 9. HIGH RISK DISEASE: "Do you have any chronic medical problems?" (e.g., asthma, heart or lung disease, weak immune system, obesity, etc.)     HTN, Chemotherapy 10. VACCINE: "Have you had the COVID-19 vaccine?" If Yes, ask: "Which one, how many shots, when did you get it?"        Yes 11. BOOSTER: "Have you received your COVID-19 booster?" If Yes, ask: "Which one and when did you get it?"       No 12. PREGNANCY: "Is there any chance you are pregnant?" "When was your last menstrual period?"       No 13. OTHER SYMPTOMS: "Do you have any other symptoms?"  (e.g., chills, fatigue, headache, loss of smell or taste, muscle pain, sore throat)       Headache,body aches 14. O2 SATURATION MONITOR:  "Do you use an oxygen saturation monitor (pulse oximeter) at home?" If Yes, ask "What is your reading (oxygen level) today?" "What is your usual oxygen saturation reading?" (e.g., 95%)       No  Protocols used: CORONAVIRUS (COVID-19) DIAGNOSED OR SUSPECTED-A-AH

## 2020-09-23 NOTE — Progress Notes (Signed)
[name], Erica Bartlett are scheduled for a virtual visit with your provider today.    Just as we do with appointments in the office, we must obtain your consent to participate.  Your consent will be active for this visit and any virtual visit you may have with one of our providers in the next 365 days.    If you have a MyChart account, I can also send a copy of this consent to you electronically.  All virtual visits are billed to your insurance company just like a traditional visit in the office.  As this is a virtual visit, video technology does not allow for your provider to perform a traditional examination.  This may limit your provider's ability to fully assess your condition.  If your provider identifies any concerns that need to be evaluated in person or the need to arrange testing such as labs, EKG, etc, we will make arrangements to do so.    Although advances in technology are sophisticated, we cannot ensure that it will always work on either your end or our end.  If the connection with a video visit is poor, we may have to switch to a telephone visit.  With either a video or telephone visit, we are not always able to ensure that we have a secure connection.   I need to obtain your verbal consent now.   Are you willing to proceed with your visit today?   Erica Bartlett has provided verbal consent on 09/23/2020 for a virtual visit (video or telephone).   Leeanne Rio, PA-C 09/23/2020  11:31 AM    Virtual Visit via Video   I connected with patient on 09/23/20 at 11:30 AM EDT by a video enabled telemedicine application and verified that I am speaking with the correct person using two identifiers.  Location patient: Home Location provider: [place] participating in the virtual visit: Patient, Provider  I discussed the limitations of evaluation and management by telemedicine and the availability of in person appointments. The patient expressed  understanding and agreed to proceed.  Subjective:   HPI:  Patient presents via [place] today at request of evsit provider to discuss addition of antiviral therapy for COVID. Patient endorses symptoms starting on 09/20/2020, including headache, cough, sore throat, body aches.  Notes symptoms continue to worsen so she was tested for COVID getting positive result today 09/23/2020.  Patient denies any chest pain or shortness of breath.  Does endorse a low-grade fever (T-max less than 101).  Is hydrating and trying to rest.  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  Notes her son died from [place] in the past year and so she is very concerned about having COVID herself.  ROS:   See pertinent positives and negatives per HPI.  Patient Active Problem List   Diagnosis Date Noted  . Lump of right breast 03/06/2020  . Goals of care, counseling/discussion 07/02/2019  . Chronic maxillary sinusitis 12/23/2015  . Bursitis of right shoulder 11/19/2015  . Right shoulder pain 10/28/2015  . Hypokalemia 08/03/2015  . 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 Thrombocytopenia ([place]) 07/27/2015  . Bruising, spontaneous 07/26/2015  . Chronic leg pain 06/28/2015  . Plantar warts 06/28/2015  . Fibromyalgia 05/19/2015  . Allergic rhinitis 02/18/2015  . Smoker 02/18/2015  . Other spondylosis with radiculopathy, cervical region 10/28/2014  . Other shoulder lesions, right shoulder 10/28/2014  . Rotator cuff tendinitis, right 10/28/2014  . Hypertension 10/14/2014    Social  History   Tobacco Use  . Smoking status: Current Some Day Smoker    Packs/day: 0.15    Types: Cigarettes  . Smokeless tobacco: Never Used  . Tobacco comment: 1 pack takes 3.5-4 weeks to finish   Substance Use Topics  . Alcohol use: No    Current Outpatient Medications:  .  acetaminophen (TYLENOL) 500 MG tablet, Take 500 mg by mouth every 6 (six) hours as needed., Disp: , Rfl:  .  amLODipine  (NORVASC) 10 MG tablet, Take 1 tablet (10 mg total) by mouth daily., Disp: 90 tablet, Rfl: 3 .  amoxicillin-clavulanate (AUGMENTIN) 875-125 MG tablet, Take 1 tablet by mouth 2 (two) times daily., Disp: 20 tablet, Rfl: 0 .  cetirizine (ZYRTEC) 10 MG tablet, Take 1 tablet (10 mg total) by mouth daily., Disp: 30 tablet, Rfl: 11 .  clonazePAM (KLONOPIN) 1 MG tablet, Take 1-2 mg by mouth 3 (three) times daily as needed. Reported on 10/11/2015, Disp: , Rfl:  .  clotrimazole-betamethasone (LOTRISONE) cream, Apply 1 application topically 2 (two) times daily., Disp: 45 g, Rfl: 2 .  diphenhydrAMINE (BENADRYL) 25 mg capsule, Take 25 mg by mouth as needed., Disp: , Rfl:  .  fluorouracil (EFUDEX) 5 % cream, Apply topically 2 (two) times daily., Disp: 40 g, Rfl: 2 .  fluticasone (FLONASE) 50 MCG/ACT nasal spray, Place 2 sprays into both nostrils daily., Disp: 16 g, Rfl: 11 .  hydrochlorothiazide (HYDRODIURIL) 25 MG tablet, Take 1 tablet (25 mg total) by mouth daily., Disp: 90 tablet, Rfl: 3 .  HYDROcodone-acetaminophen (NORCO/VICODIN) 5-325 MG tablet, Take 1 tablet by mouth every 4 (four) hours as needed for moderate pain., Disp: 30 tablet, Rfl: 0 .  ondansetron (ZOFRAN) 4 MG tablet, Take 1 tablet (4 mg total) by mouth every 8 (eight) hours as needed for nausea or vomiting., Disp: 20 tablet, Rfl: 3 .  oxyCODONE-acetaminophen (PERCOCET) 5-325 MG tablet, Take 1 tablet by mouth every 6 (six) hours as needed for severe pain., Disp: 20 tablet, Rfl: 0 .  pantoprazole (PROTONIX) 40 MG tablet, TAKE 1 TABLET BY MOUTH DAILY BEFORE BREAKFAST, Disp: 90 tablet, Rfl: 1 .  predniSONE (DELTASONE) 20 MG tablet, Take daily with food. Start with 60mg  (3 pills) x 2 days, then reduce to 40mg  (2 pills) x 2 days, then 20mg  (1 pill) x 3 days, Disp: 13 tablet, Rfl: 0 .  VENTOLIN HFA 108 (90 Base) MCG/ACT inhaler, INHALE 1-2 PUFFS INTO THE LUNGS EVERY 6 (SIX) HOURS AS NEEDED FOR WHEEZING OR SHORTNESS OF BREATH., Disp: 18 g, Rfl:  0  Allergies  Allergen Reactions  . Doxycycline Nausea And Vomiting  . Penicillins Hives    Objective:   There were no vitals taken for this visit.  Patient is well-developed, well-nourished in no acute distress.  Resting comfortably at home.  Head is normocephalic, atraumatic.  No labored breathing.  Speech is clear and coherent with logical content.  Patient is alert and oriented at baseline.   Assessment and Plan:   1. COVID-19 Positive test.  Patient at above average risk for multiple reasons.  Do agree with the visit provider that we should start antiviral therapy.  For her the only true option currently is molnupiravir.  Discussed this medication with patient who voiced that she would like to proceed with treatment.  Rx sent to pharmacy.  We will have her start plain Mucinex to thin congestion.  Increase fluids.  Continue resting.  Start OTC vitamin D3 1000 units daily, vitamin C 1000 mg  daily and a zinc supplement.  Rx Tessalon for cough as well.  Patient enrolled in a symptom monitoring program so we can keep a check on her.  Strict ER precautions reviewed with patient who voiced understanding and agreement with plan.  Written copy of instruction was provided to the patient.  We will send a copy of this chart to her primary care provider for FYI. - benzonatate (TESSALON) 100 MG capsule; Take 1 capsule (100 mg total) by mouth 3 (three) times daily as needed for cough.  Dispense: 30 capsule; Refill: 0 - molnupiravir EUA 200 mg CAPS; Take 4 capsules (800 mg total) by mouth 2 (two) times daily for 5 days.  Dispense: 40 capsule; Refill: 0 - MyChart COVID-19 home monitoring program; Future .   Leeanne Rio, [place] 09/23/2020

## 2020-09-29 ENCOUNTER — Inpatient Hospital Stay: Payer: Medicare HMO | Attending: Oncology

## 2020-09-29 ENCOUNTER — Other Ambulatory Visit: Payer: Self-pay

## 2020-09-29 DIAGNOSIS — D693 Immune thrombocytopenic purpura: Secondary | ICD-10-CM | POA: Insufficient documentation

## 2020-09-29 LAB — CBC WITH DIFFERENTIAL/PLATELET
Abs Immature Granulocytes: 0.05 10*3/uL (ref 0.00–0.07)
Basophils Absolute: 0 10*3/uL (ref 0.0–0.1)
Basophils Relative: 1 %
Eosinophils Absolute: 0.1 10*3/uL (ref 0.0–0.5)
Eosinophils Relative: 2 %
HCT: 41.1 % (ref 36.0–46.0)
Hemoglobin: 14 g/dL (ref 12.0–15.0)
Immature Granulocytes: 1 %
Lymphocytes Relative: 17 %
Lymphs Abs: 1.3 10*3/uL (ref 0.7–4.0)
MCH: 28.4 pg (ref 26.0–34.0)
MCHC: 34.1 g/dL (ref 30.0–36.0)
MCV: 83.4 fL (ref 80.0–100.0)
Monocytes Absolute: 0.4 10*3/uL (ref 0.1–1.0)
Monocytes Relative: 6 %
Neutro Abs: 5.7 10*3/uL (ref 1.7–7.7)
Neutrophils Relative %: 73 %
Platelets: 151 10*3/uL (ref 150–400)
RBC: 4.93 MIL/uL (ref 3.87–5.11)
RDW: 13.9 % (ref 11.5–15.5)
WBC: 7.7 10*3/uL (ref 4.0–10.5)
nRBC: 0 % (ref 0.0–0.2)

## 2020-10-01 ENCOUNTER — Ambulatory Visit: Payer: Medicare HMO | Admitting: Podiatry

## 2020-10-05 ENCOUNTER — Other Ambulatory Visit: Payer: Self-pay | Admitting: Dermatology

## 2020-10-05 ENCOUNTER — Ambulatory Visit: Payer: Medicare HMO | Admitting: Dermatology

## 2020-10-05 ENCOUNTER — Other Ambulatory Visit: Payer: Self-pay

## 2020-10-05 DIAGNOSIS — L720 Epidermal cyst: Secondary | ICD-10-CM | POA: Diagnosis not present

## 2020-10-05 DIAGNOSIS — D492 Neoplasm of unspecified behavior of bone, soft tissue, and skin: Secondary | ICD-10-CM

## 2020-10-05 MED ORDER — MUPIROCIN 2 % EX OINT: 1.0000 "application " | TOPICAL_OINTMENT | Freq: Every day | CUTANEOUS | 1 refills | Status: DC

## 2020-10-05 NOTE — Progress Notes (Signed)
   Follow-Up Visit   Subjective  Erica Bartlett is a 65 y.o. female who presents for the following: Cyst (R med clavicle, pt presents for excision today).  The following portions of the chart were reviewed this encounter and updated as appropriate:   Tobacco  Allergies  Meds  Problems  Med Hx  Surg Hx  Fam Hx      Review of Systems:  No other skin or systemic complaints except as noted in HPI or Assessment and Plan.  Objective  Well appearing patient in no apparent distress; mood and affect are within normal limits.  A focused examination was performed including chest. Relevant physical exam findings are noted in the Assessment and Plan.  R medial clavicle Cystic pap 1.2cm   Assessment & Plan  Neoplasm of skin R neck - medial clavicle  Skin excision  Lesion length (cm):  1.2 Lesion width (cm):  1.2 Margin per side (cm):  0 Total excision diameter (cm):  1.2 Informed consent: discussed and consent obtained   Timeout: patient name, date of birth, surgical site, and procedure verified   Procedure prep:  Patient was prepped and draped in usual sterile fashion Prep type:  Isopropyl alcohol and povidone-iodine Anesthesia: the lesion was anesthetized in a standard fashion   Anesthesia comment:  3.0cc Anesthetic:  1% lidocaine w/ epinephrine 1-100,000 buffered w/ 8.4% NaHCO3 Instrument used comment:  15c blade Hemostasis achieved with: pressure   Hemostasis achieved with comment:  Electrocautery Outcome: patient tolerated procedure well with no complications   Post-procedure details: sterile dressing applied and wound care instructions given   Dressing type: bandage and pressure dressing (Mupirocin)    Skin repair Complexity:  Complex Final length (cm):  1.5 Reason for type of repair: reduce tension to allow closure, reduce the risk of dehiscence, infection, and necrosis, reduce subcutaneous dead space and avoid a hematoma, allow closure of the large defect,  preserve normal anatomy, preserve normal anatomical and functional relationships and enhance both functionality and cosmetic results   Undermining: area extensively undermined   Undermining comment:  Undermining Defect 1.2cm Subcutaneous layers (deep stitches):  Suture size:  4-0 Suture type: Vicryl (polyglactin 910)   Subcutaneous suture technique: Inverted Dermal. Fine/surface layer approximation (top stitches):  Suture size:  4-0 Suture type: nylon   Stitches: horizontal mattress   Suture removal (days):  7 Hemostasis achieved with: pressure Outcome: patient tolerated procedure well with no complications   Post-procedure details: sterile dressing applied and wound care instructions given   Dressing type: bandage, pressure dressing and bacitracin (Mupirocin)    mupirocin ointment (BACTROBAN) 2 % Apply 1 application topically daily. Qd to excision site  Specimen 1 - Surgical pathology Differential Diagnosis: D48.5 Cyst vs other  Check Margins: No Cystic pap 1.2cm  Start Mupirocin oint qd to excision site   Other Procedures Placed This Encounter BLOOD TRANSFUSION REPORT - SCANNED  Return in about 1 week (around 10/12/2020) for suture removal.  I, Othelia Pulling, RMA, am acting as scribe for Sarina Ser, MD . Documentation: I have reviewed the above documentation for accuracy and completeness, and I agree with the above.  Sarina Ser, MD

## 2020-10-05 NOTE — Patient Instructions (Addendum)
If you have any questions or concerns for your doctor, please call our main line at [phone] and press option 4 to reach your doctor's medical assistant. If no one answers, please leave a voicemail as directed and we will return your call as soon as possible. Messages left after 4 pm will be answered the following business day.   You may also send us a message via MyChart. We typically respond to MyChart messages within 1-2 business days.  For prescription refills, please ask your pharmacy to contact our office. Our fax number is [phone].  If you have an urgent issue when the clinic is closed that cannot wait until the next business day, you can page your doctor at the number below.    Please note that while we do our best to be available for urgent issues outside of office hours, we are not available 24/7.   If you have an urgent issue and are unable to reach us, you may choose to seek medical care at your doctor's office, retail clinic, urgent care center, or emergency room.  If you have a medical emergency, please immediately call 911 or go to the emergency department.  Pager Numbers  - Dr. Kowalski: [phone]  - Dr. Moye: [phone]  - Dr. Stewart: [phone]  In the event of inclement weather, please call our main line at [phone] for an update on the status of any delays or closures.  Dermatology Medication Tips: Please keep the boxes that topical medications come in in order to help keep track of the instructions about where and how to use these. Pharmacies typically print the medication instructions only on the boxes and not directly on the medication tubes.   If your medication is too expensive, please contact our office at [phone] option 4 or send us a message through MyChart.   We are unable to tell what your co-pay for medications will be in advance as this is different depending on your insurance coverage. However, we may be able to find a substitute  medication at lower cost or fill out paperwork to get insurance to cover a needed medication.   If a prior authorization is required to get your medication covered by your insurance company, please allow us 1-2 business days to complete this process.  Drug prices often vary depending on where the prescription is filled and some pharmacies may offer cheaper prices.  The website www.goodrx.com contains coupons for medications through different pharmacies. The prices here do not account for what the cost may be with help from insurance (it may be cheaper with your insurance), but the website can give you the price if you did not use any insurance.  - You can print the associated coupon and take it with your prescription to the pharmacy.  - You may also stop by our office during regular business hours and pick up a GoodRx coupon card.  - If you need your prescription sent electronically to a different pharmacy, notify our office through [facility] MyChart or by phone at [phone] option 4.   Wound Care Instructions  Cleanse wound gently with soap and water once a day then pat dry with clean gauze. Apply a thing coat of Petrolatum (petroleum jelly, "Vaseline") over the wound (unless you have an allergy to this). We recommend that you use a new, sterile tube of Vaseline. Do not pick or remove scabs. Do not remove the yellow or white "healing tissue" from the base of the wound.  Cover the   wound with fresh, clean, nonstick gauze and secure with paper tape. You may use Band-Aids in place of gauze and tape if the would is small enough, but would recommend trimming much of the tape off as there is often too much. Sometimes Band-Aids can irritate the skin.  You should call the office for your biopsy report after 1 week if you have not already been contacted.  If you experience any problems, such as abnormal amounts of bleeding, swelling, significant bruising, significant pain, or evidence of infection,  please call the office immediately.  FOR ADULT SURGERY PATIENTS: If you need something for pain relief you may take 1 extra strength Tylenol (acetaminophen) AND 2 Ibuprofen (200mg each) together every 4 hours as needed for pain. (do not take these if you are allergic to them or if you have a reason you should not take them.) Typically, you may only need pain medication for 1 to 3 days.    

## 2020-10-06 ENCOUNTER — Telehealth: Payer: Self-pay

## 2020-10-06 ENCOUNTER — Other Ambulatory Visit: Payer: Self-pay | Admitting: Dermatology

## 2020-10-06 MED ORDER — HYDROCODONE-ACETAMINOPHEN 5-300 MG PO TABS: 1.0000 | ORAL_TABLET | Freq: Three times a day (TID) | ORAL | 0 refills | Status: DC

## 2020-10-06 NOTE — Progress Notes (Signed)
Pt complains of post op pain. See telephone note for 10/06/2020. See Rx sent for pain.

## 2020-10-06 NOTE — Telephone Encounter (Addendum)
Spoke to patient and she is having a lot of pain from yesterdays surgery.  She said she did not get any sleep.  She did say she is taking Tylenol and Ibuprofen and if was not helping a bit.  I advised Dr. Nehemiah Massed said typically if someone has pain from the surgery it subsided in 2 days.  Patient would like for [place] to send something stronger then the Tylenol/Ibuprofen in for her.  Advised we would print a script for Vic[place] 5/300 1 po tid prn pain, #6 0rfs, do not drive while taking and only take if needed.  Printed script for pt and advised she could come pick up./sh

## 2020-10-06 NOTE — Progress Notes (Signed)
Reprinted prescription for Hydrocodone.

## 2020-10-12 ENCOUNTER — Other Ambulatory Visit: Payer: Self-pay

## 2020-10-12 ENCOUNTER — Ambulatory Visit: Payer: Medicare HMO | Admitting: Dermatology

## 2020-10-12 ENCOUNTER — Encounter: Payer: Self-pay | Admitting: Dermatology

## 2020-10-12 DIAGNOSIS — L720 Epidermal cyst: Secondary | ICD-10-CM

## 2020-10-12 DIAGNOSIS — Z4802 Encounter for removal of sutures: Secondary | ICD-10-CM

## 2020-10-12 NOTE — Progress Notes (Signed)
   Follow-Up Visit   Subjective  Jamesa Tedrick [place] is a 65 y.o. female who presents for the following: Cyst (7 day post op).  The following portions of the chart were reviewed this encounter and updated as appropriate:   Tobacco  Allergies  Meds  Problems  Med Hx  Surg Hx  Fam Hx      Review of Systems:  No other skin or systemic complaints except as noted in HPI or Assessment and Plan.  Objective  Well appearing patient in no apparent distress; mood and affect are within normal limits.  A focused examination was performed including chest. Relevant physical exam findings are noted in the Assessment and Plan.  Right medial clavicle Healing excision site   Assessment & Plan  Epidermal inclusion cyst Right medial clavicle  Encounter for Removal of Sutures - Incision site at the right medial clavicle is clean, dry and intact - Wound cleansed, sutures removed, wound cleansed and steri strips applied.  - Discussed pathology results showing cyst  - Patient advised to keep steri-strips dry until they fall off. - Scars remodel for a full year. - Once steri-strips fall off, patient can apply over-the-counter silicone scar cream each night to help with scar remodeling if desired. - Patient advised to call with any concerns or if they notice any new or changing lesions.   Return if symptoms worsen or fail to improve.  I, Ashok Cordia, CMA, am acting as scribe for Sarina Ser, MD .  Documentation: I have reviewed the above documentation for accuracy and completeness, and I agree with the above.  Sarina Ser, MD

## 2020-10-12 NOTE — Patient Instructions (Signed)

## 2020-10-15 ENCOUNTER — Encounter: Payer: Self-pay | Admitting: Dermatology

## 2020-11-02 ENCOUNTER — Telehealth: Payer: Self-pay

## 2020-11-02 ENCOUNTER — Ambulatory Visit: Payer: Medicare HMO

## 2020-11-02 NOTE — Telephone Encounter (Signed)
This nurse called patient in regards to our scheduled telephonic AWV. Patient states that she is about to do a zoom call and is unable to do call at this time. She would like [place] to call her at a later time to reschedule.

## 2020-11-03 ENCOUNTER — Telehealth: Payer: Self-pay | Admitting: Family Medicine

## 2020-11-03 NOTE — Telephone Encounter (Signed)
Copied from [place] [id number]. Topic: Medicare AWV >> Nov 03, 2020 12:27 PM Weston Anna wrote: Reason for CRM:  Lm to reschedule AWV - she missed November 02, 2020

## 2020-11-11 ENCOUNTER — Other Ambulatory Visit: Payer: Self-pay | Admitting: Family Medicine

## 2020-11-11 DIAGNOSIS — I10 Essential (primary) hypertension: Secondary | ICD-10-CM

## 2020-11-11 NOTE — Telephone Encounter (Signed)
Requested medications are due for refill today.  yes  Requested medications are on the active medications list.  yes  Last refill. 10/28/2019  Future visit scheduled.   yes  Notes to clinic.  Prescriptions is expired.

## 2020-11-23 ENCOUNTER — Ambulatory Visit: Payer: Medicare HMO

## 2020-11-23 ENCOUNTER — Telehealth: Payer: Self-pay

## 2020-11-23 NOTE — Telephone Encounter (Signed)
This nurse called patient three times in regards to our scheduled telephonic AWV. Called at 1355, 1400, and 1410. Message left that we will call to reschedule for another time.

## 2020-11-29 ENCOUNTER — Telehealth: Payer: Self-pay | Admitting: Family Medicine

## 2020-11-29 ENCOUNTER — Other Ambulatory Visit: Payer: Self-pay | Admitting: Family Medicine

## 2020-11-29 DIAGNOSIS — K219 Gastro-esophageal reflux disease without esophagitis: Secondary | ICD-10-CM

## 2020-11-29 NOTE — Telephone Encounter (Signed)
Left message for patient to call back and schedule the Medicare Annual Wellness Visit (AWV) virtually or by telephone.  Last AWV 12/17/2018  Please schedule at anytime with [facility].  40 minute appointment  Any questions, please call me at [phone]

## 2020-11-29 NOTE — Telephone Encounter (Signed)
Requested medication (s) are due for refill today: yes  Requested medication (s) are on the active medication list:  yes  Last refill:  09/03/2020  Future visit scheduled: no  Notes to clinic: review for refill  Last several appt has already been canceled or missed   Requested Prescriptions  Pending Prescriptions Disp Refills   pantoprazole (PROTONIX) 40 MG tablet [Pharmacy Med Name: PANTOPRAZOLE SOD DR 40 MG TAB] 90 tablet 1    Sig: TAKE 1 TABLET BY MOUTH [place]      Gastroenterology: Proton Pump Inhibitors Passed - 11/29/2020  2:03 AM      Passed - Valid encounter within last 12 months    Recent Outpatient Visits           3 months ago Acute non-recurrent frontal sinusitis   [place], DO   8 months ago Acute recurrent frontal sinusitis   [facility] Olin Hauser, DO   11 months ago Acute recurrent frontal sinusitis   [facility] Olin Hauser, DO   12 months ago Cough with exposure to COVID-19 virus   [place], Devonne Doughty, DO   1 year ago Essential hypertension   [place], Devonne Doughty, DO       Future Appointments             In 1 month [place] Ormond, Kathlene November, MD [facility]

## 2020-11-30 ENCOUNTER — Other Ambulatory Visit: Payer: Self-pay

## 2020-11-30 ENCOUNTER — Encounter: Payer: Self-pay | Admitting: Family Medicine

## 2020-11-30 ENCOUNTER — Telehealth (INDEPENDENT_AMBULATORY_CARE_PROVIDER_SITE_OTHER): Payer: Medicare HMO | Admitting: Family Medicine

## 2020-11-30 VITALS — Wt 175.0 lb

## 2020-11-30 DIAGNOSIS — M4722 Other spondylosis with radiculopathy, cervical region: Secondary | ICD-10-CM | POA: Diagnosis not present

## 2020-11-30 DIAGNOSIS — J0100 Acute maxillary sinusitis, unspecified: Secondary | ICD-10-CM | POA: Diagnosis not present

## 2020-11-30 DIAGNOSIS — M7581 Other shoulder lesions, right shoulder: Secondary | ICD-10-CM | POA: Diagnosis not present

## 2020-11-30 MED ORDER — AMOXICILLIN-POT CLAVULANATE 875-125 MG PO TABS: 1.0000 | ORAL_TABLET | Freq: Two times a day (BID) | ORAL | 0 refills | Status: DC

## 2020-11-30 MED ORDER — PREDNISONE 20 MG PO TABS: ORAL_TABLET | ORAL | 0 refills | Status: DC

## 2020-11-30 MED ORDER — BACLOFEN 10 MG PO TABS
5.0000 mg | ORAL_TABLET | Freq: Three times a day (TID) | ORAL | 1 refills | Status: DC | PRN
Start: 2020-11-30 — End: 2021-11-02

## 2020-11-30 NOTE — Progress Notes (Signed)
Virtual Visit via Telephone The purpose of this virtual visit is to provide medical care while limiting exposure to the novel coronavirus (COVID19) for both patient and office staff.  Consent was obtained for phone visit:  Yes.   Answered questions that patient had about telehealth interaction:  Yes.   I discussed the limitations, risks, security and privacy concerns of performing an evaluation and management service by telephone. I also discussed with the patient that there may be a patient responsible charge related to this service. The patient expressed understanding and agreed to proceed.  Patient Location: Home Provider Location: [facility] (Office)  Participants in virtual visit: - Patient: Erica Bartlett - CMA: Orinda Kenner, CMA - Provider: Dr Parks Ranger  ---------------------------------------------------------------------- Chief Complaint  Patient presents with   Cough   Back Pain    S: Reviewed CMA documentation. I have called patient and gathered additional HPI as follows:  Acute Sinusitis  Last treatment 08/19/20 Augmentin.  Prior visits for sinusitis / history of compromised immune system. She said that recently grandchildren exposed to covid / illness but no active symptoms or sickness. Now with acute onset sinus pressure pain drainage, thicker yellow drainage, occasional wheezing with cough. Previuosly improved on steroid before  Chronic Neck / Shoulde rpain Neck Spasm / Pain History of OA C-spine DJD / Rotator Cuff Bursitis history Used ice and heat, Tylenol PRN Previously PT at [facility] some improvement Has done well on steroid course previously  Denies any known or suspected exposure to person with or possibly with COVID19.  Denies any fevers, chills, sweats, body ache, cough, shortness of breath, headache, abdominal pain, diarrhea  Past Medical History:  Diagnosis Date   Hypertension    Social History   Tobacco Use    Smoking status: Some Days    Packs/day: 0.15    Types: Cigarettes   Smokeless tobacco: Never   Tobacco comments:    1 pack takes 3.5-4 weeks to finish   Vaping Use   Vaping Use: Never used  Substance Use Topics   Alcohol use: No   Drug use: No    Current Outpatient Medications:    acetaminophen (TYLENOL) 500 MG tablet, Take 500 mg by mouth every 6 (six) hours as needed., Disp: , Rfl:    amLODipine (NORVASC) 10 MG tablet, Take 1 tablet (10 mg total) by mouth daily., Disp: 90 tablet, Rfl: 3   amoxicillin-clavulanate (AUGMENTIN) 875-125 MG tablet, Take 1 tablet by mouth 2 (two) times daily., Disp: 20 tablet, Rfl: 0   baclofen (LIORESAL) 10 MG tablet, Take 0.5-1 tablets (5-10 mg total) by mouth 3 (three) times daily as needed for muscle spasms., Disp: 30 each, Rfl: 1   cetirizine (ZYRTEC) 10 MG tablet, Take 1 tablet (10 mg total) by mouth daily., Disp: 30 tablet, Rfl: 11   clonazePAM (KLONOPIN) 1 MG tablet, Take 1-2 mg by mouth 3 (three) times daily as needed. Reported on 10/11/2015, Disp: , Rfl:    clotrimazole-betamethasone (LOTRISONE) cream, Apply 1 application topically 2 (two) times daily., Disp: 45 g, Rfl: 2   diphenhydrAMINE (BENADRYL) 25 mg capsule, Take 25 mg by mouth as needed., Disp: , Rfl:    fluorouracil (EFUDEX) 5 % cream, Apply topically 2 (two) times daily., Disp: 40 g, Rfl: 2   fluticasone (FLONASE) 50 MCG/ACT nasal spray, Place 2 sprays into both nostrils daily., Disp: 16 g, Rfl: 11   hydrochlorothiazide (HYDRODIURIL) 25 MG tablet, TAKE 1 TABLET BY MOUTH EVERY DAY, Disp: 90 tablet, Rfl:  3   mupirocin ointment (BACTROBAN) 2 %, Apply 1 application topically daily. Qd to excision site, Disp: 22 g, Rfl: 1   ondansetron (ZOFRAN) 4 MG tablet, Take 1 tablet (4 mg total) by mouth every 8 (eight) hours as needed for nausea or vomiting., Disp: 20 tablet, Rfl: 3   pantoprazole (PROTONIX) 40 MG tablet, TAKE 1 TABLET BY MOUTH EVERY DAY BEFORE BREAKFAST, Disp: 90 tablet, Rfl: 1    predniSONE (DELTASONE) 20 MG tablet, Take daily with food. Start with '60mg'$  (3 pills) x 2 days, then reduce to '40mg'$  (2 pills) x 2 days, then '20mg'$  (1 pill) x 3 days, Disp: 13 tablet, Rfl: 0   VENTOLIN HFA 108 (90 Base) MCG/ACT inhaler, INHALE 1-2 PUFFS INTO THE LUNGS EVERY 6 (SIX) HOURS AS NEEDED FOR WHEEZING OR SHORTNESS OF BREATH., Disp: 18 g, Rfl: 0  Depression screen [facility] 2/9 08/19/2020 06/17/2019 03/28/2019  Decreased Interest 0 0 0  Down, Depressed, Hopeless 0 0 0  PHQ - 2 Score 0 0 0  Altered sleeping - - -  Tired, decreased energy - - -  Change in appetite - - -  Feeling bad or failure about yourself  - - -  Trouble concentrating - - -  Moving slowly or fidgety/restless - - -  Suicidal thoughts - - -  PHQ-9 Score - - -  Difficult doing work/chores Not difficult at all - -  Some recent data might be hidden    GAD 7 : Generalized Anxiety Score 10/15/2018 08/26/2018  Nervous, Anxious, on Edge 1 1  Control/stop worrying 1 1  Worry too much - different things 1 1  Trouble relaxing 2 0  Restless 0 0  Easily annoyed or irritable 0 1  Afraid - awful might happen 1 1  Total GAD 7 Score 6 5  Anxiety Difficulty Not difficult at all Not difficult at all    -------------------------------------------------------------------------- O: No physical exam performed due to remote telephone encounter.  Lab results reviewed.  Recent Results (from the past 2160 hour(s))  CBC with Differential/Platelet     Status: None   Collection Time: 09/29/20  9:54 AM  Result Value Ref Range   WBC 7.7 4.0 - 10.5 K/uL   RBC 4.93 3.87 - 5.11 MIL/uL   Hemoglobin 14.0 12.0 - 15.0 g/dL   HCT 41.1 36.0 - 46.0 %   MCV 83.4 80.0 - 100.0 fL   MCH 28.4 26.0 - 34.0 pg   MCHC 34.1 30.0 - 36.0 g/dL   RDW 13.9 11.5 - 15.5 %   Platelets 151 150 - 400 K/uL   nRBC 0.0 0.0 - 0.2 %   Neutrophils Relative % 73 %   Neutro Abs 5.7 1.7 - 7.7 K/uL   Lymphocytes Relative 17 %   Lymphs Abs 1.3 0.7 - 4.0 K/uL   Monocytes  Relative 6 %   Monocytes Absolute 0.4 0.1 - 1.0 K/uL   Eosinophils Relative 2 %   Eosinophils Absolute 0.1 0.0 - 0.5 K/uL   Basophils Relative 1 %   Basophils Absolute 0.0 0.0 - 0.1 K/uL   Immature Granulocytes 1 %   Abs Immature Granulocytes 0.05 0.00 - 0.07 K/uL    Comment: Performed at [facility], [address] [place], [place] [address]    -------------------------------------------------------------------------- A&P:  Problem List Items Addressed This Visit     Rotator cuff tendinitis, right   Relevant Medications   baclofen (LIORESAL) 10 MG tablet   predniSONE (DELTASONE) 20 MG tablet  Other spondylosis with radiculopathy, cervical region   Relevant Medications   baclofen (LIORESAL) 10 MG tablet   predniSONE (DELTASONE) 20 MG tablet   Other Visit Diagnoses     Acute non-recurrent maxillary sinusitis    -  Primary   Relevant Medications   predniSONE (DELTASONE) 20 MG tablet   amoxicillin-clavulanate (AUGMENTIN) 875-125 MG tablet      Consistent with acute frontal sinusitis, with worsening concern for bacterial infection given immunocompromised history  DDD / Chronic neck pain / DJD flare up with likely bursitis   Plan: 1. Rx Augmentin antibiotic BID x 10 days, can start or wait 48 hours. 2. Start Prednisone taper for spine and sinus - Rx Baclofen 5-'10mg'$  PRN caution sedation 3. Continue allergy sinus meds Return criteria reviewed  Meds ordered this encounter  Medications   baclofen (LIORESAL) 10 MG tablet    Sig: Take 0.5-1 tablets (5-10 mg total) by mouth 3 (three) times daily as needed for muscle spasms.    Dispense:  30 each    Refill:  1   predniSONE (DELTASONE) 20 MG tablet    Sig: Take daily with food. Start with '60mg'$  (3 pills) x 2 days, then reduce to '40mg'$  (2 pills) x 2 days, then '20mg'$  (1 pill) x 3 days    Dispense:  13 tablet    Refill:  0   amoxicillin-clavulanate (AUGMENTIN) 875-125 MG tablet    Sig: Take 1 tablet by mouth 2 (two)  times daily.    Dispense:  20 tablet    Refill:  0    Follow-up: - Return in PRN  Patient verbalizes understanding with the above medical recommendations including the limitation of remote medical advice.  Specific follow-up and call-back criteria were given for patient to follow-up or seek medical care more urgently if needed.   - Time spent in direct consultation with patient on phone: 9 minutes  [name], [facility] 11/30/2020, 12:04 PM

## 2020-12-12 ENCOUNTER — Other Ambulatory Visit: Payer: Self-pay | Admitting: Family Medicine

## 2020-12-12 DIAGNOSIS — I10 Essential (primary) hypertension: Secondary | ICD-10-CM

## 2020-12-12 NOTE — Telephone Encounter (Signed)
Requested medication (s) are due for refill today: yes  Requested medication (s) are on the active medication list: yes  Last refill:  10/28/19 #90 3 RF  Future visit scheduled: yes  Notes to clinic:  called pt and made appointment for f/u BP and refill of meds   Requested Prescriptions  Pending Prescriptions Disp Refills   amLODipine (NORVASC) 10 MG tablet [Pharmacy Med Name: AMLODIPINE BESYLATE 10 MG TAB] 90 tablet 3    Sig: TAKE 1 TABLET BY MOUTH EVERY DAY     Cardiovascular:  Calcium Channel Blockers Passed - 12/12/2020  9:19 AM      Passed - Last BP in normal range    BP Readings from Last 1 Encounters:  03/09/20 131/81          Passed - Valid encounter within last 6 months    Recent Outpatient Visits           1 week ago Acute non-recurrent maxillary sinusitis   [place], DO   3 months ago Acute non-recurrent frontal sinusitis   [place], DO   9 months ago Acute recurrent frontal sinusitis   [place], DO   1 year ago Acute recurrent frontal sinusitis   [place], DO   1 year ago Cough with exposure to COVID-19 virus   [place], DO       Future Appointments             In 1 week Parks Ranger, Devonne Doughty, [facility], [place]   In 3 weeks [place], MD [facility]

## 2020-12-24 ENCOUNTER — Ambulatory Visit: Payer: Medicare HMO | Admitting: Family Medicine

## 2021-01-01 NOTE — Progress Notes (Signed)
[facility]  [phone]) [phone] [phone]  ID: Crystal Scarberry Machorro OB: April 16, 1956  MR#: [id number]  BTD#:[id number]  Patient Care Team: Olin Hauser, DO as PCP - General (Family Medicine) Chucky May, MD as Consulting Physician (Psychiatry) Lloyd Huger, MD as Consulting Physician (Oncology) Greg Cutter, LCSW as Social Worker (Licensed Clinical Social Worker)  I connected with Vanita Cannell Tiffany on 01/09/21 at  2:45 PM EDT by video enabled telemedicine visit and verified that I am speaking with the correct person using two identifiers.   I discussed the limitations, risks, security and privacy concerns of performing an evaluation and management service by telemedicine and the availability of in-person appointments. I also discussed with the patient that there may be a patient responsible charge related to this service. The patient expressed understanding and agreed to proceed.   Other persons participating in the visit and their role in the encounter: Patient, MD.  Patient's location: Home. Provider's location: Clinic.  CHIEF COMPLAINT: ITP.  INTERVAL HISTORY: Patient agreed to video assisted telemedicine visit for further evaluation, discussion of her laboratory results, and treatment planning.  She admits to increased bruising over the past several weeks.  She continues to have chronic foot pain and this is actively being treated by podiatry patient agreed to video assisted telemedicine visit for further evaluation and discussion of her laboratory results.  She continues to have foot pain and is actively being evaluated and treated by podiatry.  She otherwise feels well. She denies any easy bleeding or bruising.  She has no neurologic complaints. She denies any chest pain, shortness of breath, cough, or hemoptysis.  She has no nausea, vomiting, constipation, or diarrhea. She has no urinary complaints.  Patient offers no further  specific complaints today.  REVIEW OF SYSTEMS:    Review of Systems  Constitutional:  Negative for fever, malaise/fatigue and weight loss.  HENT: Negative.  Negative for congestion.   Respiratory: Negative.  Negative for cough and shortness of breath.   Cardiovascular: Negative.  Negative for chest pain and leg swelling.  Gastrointestinal: Negative.  Negative for blood in stool, melena and nausea.  Genitourinary: Negative.  Negative for hematuria.  Musculoskeletal:  Positive for back pain. Negative for joint pain.  Skin: Negative.  Negative for rash.  Neurological: Negative.  Negative for sensory change, focal weakness, weakness and headaches.  Endo/Heme/Allergies:  Bruises/bleeds easily.  Psychiatric/Behavioral: Negative.  Negative for depression. The patient is not nervous/anxious and does not have insomnia.    As per HPI. Otherwise, a complete review of systems is negative.  PAST MEDICAL HISTORY: Past Medical History:  Diagnosis Date   Hypertension     PAST SURGICAL HISTORY: Past Surgical History:  Procedure Laterality Date   ABDOMINAL HYSTERECTOMY     BREAST BIOPSY Left    age 65 surgical bx neg   CESAREAN SECTION     cyst removed     breast, benign    FAMILY HISTORY: Reviewed and unchanged. No reported history of malignancy or chronic disease.     ADVANCED DIRECTIVES:    HEALTH MAINTENANCE: Social History   Tobacco Use   Smoking status: Some Days    Packs/day: 0.15    Types: Cigarettes   Smokeless tobacco: Never   Tobacco comments:    1 pack takes 3.5-4 weeks to finish   Vaping Use   Vaping Use: Never used  Substance Use Topics   Alcohol use: No   Drug use: No    Allergies  Allergen Reactions   Doxycycline Nausea And Vomiting   Penicillins Hives    Current Outpatient Medications  Medication Sig Dispense Refill   acetaminophen (TYLENOL) 500 MG tablet Take 500 mg by mouth every 6 (six) hours as needed.     amLODipine (NORVASC) 10 MG tablet TAKE 1  TABLET BY MOUTH EVERY DAY 30 tablet 0   baclofen (LIORESAL) 10 MG tablet Take 0.5-1 tablets (5-10 mg total) by mouth 3 (three) times daily as needed for muscle spasms. 30 each 1   cetirizine (ZYRTEC) 10 MG tablet Take 1 tablet (10 mg total) by mouth daily. 30 tablet 11   citalopram (CELEXA) 40 MG tablet Take 40 mg by mouth daily.     clonazePAM (KLONOPIN) 1 MG tablet Take 1-2 mg by mouth 3 (three) times daily as needed. Reported on 10/11/2015     clotrimazole-betamethasone (LOTRISONE) cream Apply 1 application topically 2 (two) times daily. 45 g 2   diphenhydrAMINE (BENADRYL) 25 mg capsule Take 25 mg by mouth as needed.     fluorouracil (EFUDEX) 5 % cream Apply topically 2 (two) times daily. 40 g 2   fluticasone (FLONASE) 50 MCG/ACT nasal spray Place 2 sprays into both nostrils daily. 16 g 11   hydrochlorothiazide (HYDRODIURIL) 25 MG tablet TAKE 1 TABLET BY MOUTH EVERY DAY 90 tablet 3   mupirocin ointment (BACTROBAN) 2 % Apply 1 application topically daily. Qd to excision site 22 g 1   ondansetron (ZOFRAN) 4 MG tablet Take 1 tablet (4 mg total) by mouth every 8 (eight) hours as needed for nausea or vomiting. 20 tablet 3   pantoprazole (PROTONIX) 40 MG tablet TAKE 1 TABLET BY MOUTH EVERY DAY BEFORE BREAKFAST 90 tablet 1   predniSONE (DELTASONE) 50 MG tablet Take 1 tablet (50 mg total) by mouth daily with breakfast. 7 tablet 0   VENTOLIN HFA 108 (90 Base) MCG/ACT inhaler INHALE 1-2 PUFFS INTO THE LUNGS EVERY 6 (SIX) HOURS AS NEEDED FOR WHEEZING OR SHORTNESS OF BREATH. 18 g 0   amoxicillin-clavulanate (AUGMENTIN) 875-125 MG tablet Take 1 tablet by mouth 2 (two) times daily. (Patient not taking: Reported on 01/06/2021) 20 tablet 0   predniSONE (DELTASONE) 20 MG tablet Take daily with food. Start with 56m (3 pills) x 2 days, then reduce to 457m(2 pills) x 2 days, then 2059m1 pill) x 3 days (Patient not taking: Reported on 01/06/2021) 13 tablet 0   No current facility-administered medications for this  visit.    OBJECTIVE: There were no vitals filed for this visit.   There is no height or weight on file to calculate BMI.    ECOG FS:0 - Asymptomatic  General: Well-developed, well-nourished, no acute distress. HEENT: Normocephalic. Neuro: Alert, answering all questions appropriately. Cranial nerves grossly intact. Psych: Normal affect.   LAB RESULTS:  Lab Results  Component Value Date   NA 138 08/17/2016   K 3.6 08/17/2016   CL 101 08/17/2016   CO2 25 08/17/2016   GLUCOSE 81 08/17/2016   BUN 11 08/17/2016   CREATININE 0.88 08/17/2016   CALCIUM 9.4 08/17/2016   PROT 7.3 08/17/2016   ALBUMIN 4.0 08/17/2016   AST 25 08/17/2016   ALT 30 (H) 08/17/2016   ALKPHOS 85 08/17/2016   BILITOT 0.3 08/17/2016   GFRNONAA 64 08/04/2015   GFRAA 74 08/04/2015    Lab Results  Component Value Date   WBC 6.4 01/05/2021   NEUTROABS 4.5 01/05/2021   HGB 14.8 01/05/2021   HCT 43.2 01/05/2021  MCV 83.9 01/05/2021   PLT 9 (LL) 01/05/2021     HEMATOLOGY HISTORY: Bone marrow biopsy performed on July 28, 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 Patient responded to steroids, but the results were not durable.  Patient has received weekly Rituxan x4 with excellent results each time completing treatment on the following dates:  1.  Aug 30, 2015. 2.  November 23, 2016. 3.  December 20, 2017.   4.  July 23, 2019.  STUDIES: No results found.  ASSESSMENT: Bone marrow biopsy proven ITP.  PLAN:    1.  ITP: Patient's platelet count continues to be within normal limits.  She last received treatment with Rituxan in March 2021.  If patient requires Rituxan again, she will need increased premedications  since she has mild reaction with cycle 1.  Patient's platelet count has decreased to 9 and she is symptomatic.  She was given a prednisone taper to bridge her until she can return to clinic on January 12, 2021 to receive cycle 1 of weekly Rituxan.  She would then return to  clinic on January 19, 2021 for repeat laboratory work, further evaluation, and continuation of Rituxan. 2.  Leukocytosis: Resolved. 3.  Back pain: Chronic and unchanged.  Continue evaluation and treatment per primary care. 4.  Foot pain: Chronic and unchanged.  Continue follow-up with podiatry as scheduled.  I provided 30 minutes of face-to-face video visit time during this encounter which included chart review, counseling, and coordination of care as documented above.   Patient expressed understanding and was in agreement with this plan. She also understands that She can call clinic at any time with any questions, concerns, or complaints.   Lloyd Huger, MD   01/09/2021 10:19 AM

## 2021-01-05 ENCOUNTER — Inpatient Hospital Stay: Payer: Medicare HMO | Attending: Oncology

## 2021-01-05 ENCOUNTER — Other Ambulatory Visit: Payer: Self-pay | Admitting: Oncology

## 2021-01-05 ENCOUNTER — Telehealth: Payer: Self-pay

## 2021-01-05 DIAGNOSIS — M79673 Pain in unspecified foot: Secondary | ICD-10-CM | POA: Insufficient documentation

## 2021-01-05 DIAGNOSIS — Z5111 Encounter for antineoplastic chemotherapy: Secondary | ICD-10-CM | POA: Insufficient documentation

## 2021-01-05 DIAGNOSIS — Z79899 Other long term (current) drug therapy: Secondary | ICD-10-CM | POA: Diagnosis not present

## 2021-01-05 DIAGNOSIS — G8929 Other chronic pain: Secondary | ICD-10-CM | POA: Diagnosis not present

## 2021-01-05 DIAGNOSIS — M549 Dorsalgia, unspecified: Secondary | ICD-10-CM | POA: Diagnosis not present

## 2021-01-05 DIAGNOSIS — D693 Immune thrombocytopenic purpura: Secondary | ICD-10-CM | POA: Insufficient documentation

## 2021-01-05 DIAGNOSIS — J320 Chronic maxillary sinusitis: Secondary | ICD-10-CM

## 2021-01-05 LAB — CBC WITH DIFFERENTIAL/PLATELET
Abs Immature Granulocytes: 0.02 10*3/uL (ref 0.00–0.07)
Basophils Absolute: 0.1 10*3/uL (ref 0.0–0.1)
Basophils Relative: 1 %
Eosinophils Absolute: 0.2 10*3/uL (ref 0.0–0.5)
Eosinophils Relative: 2 %
HCT: 43.2 % (ref 36.0–46.0)
Hemoglobin: 14.8 g/dL (ref 12.0–15.0)
Immature Granulocytes: 0 %
Lymphocytes Relative: 20 %
Lymphs Abs: 1.3 10*3/uL (ref 0.7–4.0)
MCH: 28.7 pg (ref 26.0–34.0)
MCHC: 34.3 g/dL (ref 30.0–36.0)
MCV: 83.9 fL (ref 80.0–100.0)
Monocytes Absolute: 0.4 10*3/uL (ref 0.1–1.0)
Monocytes Relative: 6 %
Neutro Abs: 4.5 10*3/uL (ref 1.7–7.7)
Neutrophils Relative %: 71 %
Platelets: 9 10*3/uL — CL (ref 150–400)
RBC: 5.15 MIL/uL — ABNORMAL HIGH (ref 3.87–5.11)
RDW: 14.4 % (ref 11.5–15.5)
Smear Review: NORMAL
WBC: 6.4 10*3/uL (ref 4.0–10.5)
nRBC: 0 % (ref 0.0–0.2)

## 2021-01-05 MED ORDER — PREDNISONE 50 MG PO TABS: 50.0000 mg | ORAL_TABLET | Freq: Every day | ORAL | 0 refills | Status: DC

## 2021-01-05 NOTE — Telephone Encounter (Signed)
Prednisone rx sent to pharmacy and she is scheduled for Rituxan on 9/21 @ 8:30.  Patient is aware.

## 2021-01-05 NOTE — Telephone Encounter (Signed)
Message received from lab with critical platelet of 9 today.  MD wants patient to be prescribed a 7 day prednisone 50 mg daily and schedule rituxan asap.

## 2021-01-06 ENCOUNTER — Encounter: Payer: Self-pay | Admitting: Oncology

## 2021-01-06 ENCOUNTER — Inpatient Hospital Stay (HOSPITAL_BASED_OUTPATIENT_CLINIC_OR_DEPARTMENT_OTHER): Payer: Medicare HMO | Admitting: Oncology

## 2021-01-06 DIAGNOSIS — D693 Immune thrombocytopenic purpura: Secondary | ICD-10-CM | POA: Diagnosis not present

## 2021-01-06 NOTE — Progress Notes (Signed)
Patient took her first dose of the Prednisone today and noticed some chest pain located mid to right but thinks it could be due to taking on an empty stomach.

## 2021-01-09 ENCOUNTER — Encounter: Payer: Self-pay | Admitting: Oncology

## 2021-01-12 ENCOUNTER — Inpatient Hospital Stay: Payer: Medicare HMO

## 2021-01-12 ENCOUNTER — Other Ambulatory Visit: Payer: Self-pay

## 2021-01-12 VITALS — BP 123/69 | HR 81 | Temp 96.4°F | Resp 20 | Wt 168.0 lb

## 2021-01-12 DIAGNOSIS — I10 Essential (primary) hypertension: Secondary | ICD-10-CM

## 2021-01-12 DIAGNOSIS — G8929 Other chronic pain: Secondary | ICD-10-CM | POA: Diagnosis not present

## 2021-01-12 DIAGNOSIS — D693 Immune thrombocytopenic purpura: Secondary | ICD-10-CM

## 2021-01-12 DIAGNOSIS — M549 Dorsalgia, unspecified: Secondary | ICD-10-CM | POA: Diagnosis not present

## 2021-01-12 DIAGNOSIS — Z79899 Other long term (current) drug therapy: Secondary | ICD-10-CM | POA: Diagnosis not present

## 2021-01-12 DIAGNOSIS — M79673 Pain in unspecified foot: Secondary | ICD-10-CM | POA: Diagnosis not present

## 2021-01-12 DIAGNOSIS — Z5111 Encounter for antineoplastic chemotherapy: Secondary | ICD-10-CM | POA: Diagnosis not present

## 2021-01-12 MED ORDER — AMLODIPINE BESYLATE 10 MG PO TABS: 10.0000 mg | ORAL_TABLET | Freq: Every day | ORAL | 3 refills | Status: DC

## 2021-01-12 MED ORDER — DIPHENHYDRAMINE HCL 50 MG/ML IJ SOLN
50.0000 mg | Freq: Once | INTRAMUSCULAR | Status: AC
  Administered 2021-01-12: 50 mg via INTRAVENOUS
  Filled 2021-01-12: qty 1

## 2021-01-12 MED ORDER — FAMOTIDINE IN NACL 20-0.9 MG/50ML-% IV SOLN
20.0000 mg | Freq: Two times a day (BID) | INTRAVENOUS | Status: DC
  Filled 2021-01-12: qty 50

## 2021-01-12 MED ORDER — FAMOTIDINE 20 MG IN NS 100 ML IVPB
20.0000 mg | Freq: Once | INTRAVENOUS | Status: AC
  Administered 2021-01-12: 20 mg via INTRAVENOUS
  Filled 2021-01-12: qty 20

## 2021-01-12 MED ORDER — ACETAMINOPHEN 325 MG PO TABS
650.0000 mg | ORAL_TABLET | Freq: Once | ORAL | Status: AC
  Administered 2021-01-12: 650 mg via ORAL
  Filled 2021-01-12: qty 2

## 2021-01-12 MED ORDER — SODIUM CHLORIDE 0.9 % IV SOLN
Freq: Once | INTRAVENOUS | Status: AC
  Filled 2021-01-12: qty 250

## 2021-01-12 MED ORDER — LORAZEPAM 2 MG/ML IJ SOLN
0.5000 mg | Freq: Once | INTRAMUSCULAR | Status: AC
  Administered 2021-01-12: 0.5 mg via INTRAVENOUS
  Filled 2021-01-12: qty 1

## 2021-01-12 MED ORDER — SODIUM CHLORIDE 0.9 % IV SOLN
20.0000 mg | Freq: Once | INTRAVENOUS | Status: AC
  Administered 2021-01-12: 20 mg via INTRAVENOUS
  Filled 2021-01-12: qty 20

## 2021-01-12 MED ORDER — SODIUM CHLORIDE 0.9 % IV SOLN
375.0000 mg/m2 | Freq: Once | INTRAVENOUS | Status: AC
  Administered 2021-01-12: 700 mg via INTRAVENOUS
  Filled 2021-01-12: qty 50

## 2021-01-12 NOTE — Patient Instructions (Signed)
[facility]  Discharge Instructions: Thank you for choosing [facility] to provide your oncology and hematology care.  If you have a lab appointment with the [facility], please go directly to the [facility] and check in at the registration area.  Wear comfortable clothing and clothing appropriate for easy access to any Portacath or PICC line.   We strive to give you quality time with your provider. You may need to reschedule your appointment if you arrive late (15 or more minutes).  Arriving late affects you and other patients whose appointments are after yours.  Also, if you miss three or more appointments without notifying the office, you may be dismissed from the clinic at the provider's discretion.      For prescription refill requests, have your pharmacy contact our office and allow 72 hours for refills to be completed.    Today you received the following chemotherapy and/or immunotherapy agents: Ruxience      To help prevent nausea and vomiting after your treatment, we encourage you to take your nausea medication as directed.  BELOW ARE SYMPTOMS THAT SHOULD BE REPORTED IMMEDIATELY: *FEVER GREATER THAN 100.4 F (38 C) OR HIGHER *CHILLS OR SWEATING *NAUSEA AND VOMITING THAT IS NOT CONTROLLED WITH YOUR NAUSEA MEDICATION *UNUSUAL SHORTNESS OF BREATH *UNUSUAL BRUISING OR BLEEDING *URINARY PROBLEMS (pain or burning when urinating, or frequent urination) *BOWEL PROBLEMS (unusual diarrhea, constipation, pain near the anus) TENDERNESS IN MOUTH AND THROAT WITH OR WITHOUT PRESENCE OF ULCERS (sore throat, sores in mouth, or a toothache) UNUSUAL RASH, SWELLING OR PAIN  UNUSUAL VAGINAL DISCHARGE OR ITCHING   Items with * indicate a potential emergency and should be followed up as soon as possible or go to the Emergency Department if any problems should occur.  Please show the CHEMOTHERAPY ALERT CARD or IMMUNOTHERAPY ALERT CARD at check-in  to the Emergency Department and triage nurse.  Should you have questions after your visit or need to cancel or reschedule your appointment, please contact [facility]  [phone] and follow the prompts.  Office hours are 8:00 a.m. to 4:30 p.m. Monday - Friday. Please note that voicemails left after 4:00 p.m. may not be returned until the following business day.  We are closed weekends and major holidays. You have access to a nurse at all times for urgent questions. Please call the main number to the clinic [phone] and follow the prompts.  For any non-urgent questions, you may also contact your provider using MyChart. We now offer e-Visits for anyone 61 and older to request care online for non-urgent symptoms. For details visit [email or web address].   Also download the MyChart app! Go to the app store, search "MyChart", open the app, select [facility], and log in with your MyChart username and password.  Due to Covid, a mask is required upon entering the hospital/clinic. If you do not have a mask, one will be given to you upon arrival. For doctor visits, patients may have 1 support person aged 61 or older with them. For treatment visits, patients cannot have anyone with them due to current Covid guidelines and our immunocompromised population. Rituximab Injection What is this medication? RITUXIMAB (ri TUX i mab) is a monoclonal antibody. It is used to treat certain types of cancer like non-Hodgkin lymphoma and chronic lymphocytic leukemia. It is also used to treat rheumatoid arthritis, granulomatosis with polyangiitis, microscopic polyangiitis, and pemphigus vulgaris. This medicine may be used for other purposes; ask your  health care provider or pharmacist if you have questions. COMMON BRAND NAME(S): RIABNI, Rituxan, RUXIENCE What should I tell my care team before I take this medication? They need to know if you have any of these conditions: chest  pain heart disease infection especially a viral infection such as chickenpox, cold sores, hepatitis B, or herpes immune system problems irregular heartbeat or rhythm kidney disease low blood counts (white cells, platelets, or red cells) lung disease recent or upcoming vaccine an unusual or allergic reaction to rituximab, other medicines, foods, dyes, or preservatives pregnant or trying to get pregnant breast-feeding How should I use this medication? This medicine is injected into a vein. It is given by a health care provider in a hospital or clinic setting. A special MedGuide will be given to you before each treatment. Be sure to read this information carefully each time. Talk to your health care provider about the use of this medicine in children. While this drug may be prescribed for children as young as 6 months for selected conditions, precautions do apply. Overdosage: If you think you have taken too much of this medicine contact a poison control center or emergency room at once. NOTE: This medicine is only for you. Do not share this medicine with others. What if I miss a dose? Keep appointments for follow-up doses. It is important not to miss your dose. Call your health care provider if you are unable to keep an appointment. What may interact with this medication? Do not take this medicine with any of the following medicines: live vaccines This medicine may also interact with the following medicines: cisplatin This list may not describe all possible interactions. Give your health care provider a list of all the medicines, herbs, non-prescription drugs, or dietary supplements you use. Also tell them if you smoke, drink alcohol, or use illegal drugs. Some items may interact with your medicine. What should I watch for while using this medication? Your condition will be monitored carefully while you are receiving this medicine. You may need blood work done while you are taking this  medicine. This medicine can cause serious infusion reactions. To reduce the risk your health care provider may give you other medicines to take before receiving this one. Be sure to follow the directions from your health care provider. This medicine may increase your risk of getting an infection. Call your health care provider for advice if you get a fever, chills, sore throat, or other symptoms of a cold or flu. Do not treat yourself. Try to avoid being around people who are sick. Call your health care provider if you are around anyone with measles, chickenpox, or if you develop sores or blisters that do not heal properly. Avoid taking medicines that contain aspirin, acetaminophen, ibuprofen, naproxen, or ketoprofen unless instructed by your health care provider. These medicines may hide a fever. This medicine may cause serious skin reactions. They can happen weeks to months after starting the medicine. Contact your health care provider right away if you notice fevers or flu-like symptoms with a rash. The rash may be red or purple and then turn into blisters or peeling of the skin. Or, you might notice a red rash with swelling of the face, lips or lymph nodes in your neck or under your arms. In some patients, this medicine may cause a serious brain infection that may cause death. If you have any problems seeing, thinking, speaking, walking, or standing, tell your healthcare professional right away. If you cannot reach  your healthcare professional, urgently seek other source of medical care. Do not become pregnant while taking this medicine or for at least 12 months after stopping it. Women should inform their health care provider if they wish to become pregnant or think they might be pregnant. There is potential for serious harm to an unborn child. Talk to your health care provider for more information. Women should use a reliable form of birth control while taking this medicine and for 12 months after  stopping it. Do not breast-feed while taking this medicine or for at least 6 months after stopping it. What side effects may I notice from receiving this medication? Side effects that you should report to your health care provider as soon as possible: allergic reactions (skin rash, itching or hives; swelling of the face, lips, or tongue) diarrhea edema (sudden weight gain; swelling of the ankles, feet, hands or other unusual swelling; trouble breathing) fast, irregular heartbeat heart attack (trouble breathing; pain or tightness in the chest, neck, back or arms; unusually weak or tired) infection (fever, chills, cough, sore throat, pain or trouble passing urine) kidney injury (trouble passing urine or change in the amount of urine) liver injury (dark yellow or brown urine; general ill feeling or flu-like symptoms; loss of appetite, right upper belly pain; unusually weak or tired, yellowing of the eyes or skin) low blood pressure (dizziness; feeling faint or lightheaded, falls; unusually weak or tired) low red blood cell counts (trouble breathing; feeling faint; lightheaded, falls; unusually weak or tired) mouth sores redness, blistering, peeling, or loosening of the skin, including inside the mouth stomach pain unusual bruising or bleeding wheezing (trouble breathing with loud or whistling sounds) vomiting Side effects that usually do not require medical attention (report to your health care provider if they continue or are bothersome): headache joint pain muscle cramps, pain nausea This list may not describe all possible side effects. Call your doctor for medical advice about side effects. You may report side effects to FDA at 1-800-FDA-1088. Where should I keep my medication? This medicine is given in a hospital or clinic. It will not be stored at home. NOTE: This sheet is a summary. It may not cover all possible information. If you have questions about this medicine, talk to your  doctor, pharmacist, or health care provider.  2022 [employer]vier/Gold Standard (2020-04-01 15:47:26)

## 2021-01-15 NOTE — Progress Notes (Signed)
[facility]  [phone] [phone]) [phone]  ID: Erica Bartlett OB: 04/20/1956  MR#: [id number]  NUU#:[id number]  Patient Care Team: Olin Hauser, DO as PCP - General (Family Medicine) Chucky May, MD as Consulting Physician (Psychiatry) Lloyd Huger, MD as Consulting Physician (Oncology) Greg Cutter, LCSW as Social Worker (Licensed Clinical Social Worker)  CHIEF COMPLAINT: ITP.  INTERVAL HISTORY: Patient returns to clinic today for further evaluation and consideration of cycle 2 of 4 of weekly Rituxan.  She continues to have chronic foot pain and this is actively being treated by podiatry. She otherwise feels well. She denies any easy bleeding or bruising.  She has no neurologic complaints. She denies any chest pain, shortness of breath, cough, or hemoptysis.  She has no nausea, vomiting, constipation, or diarrhea. She has no urinary complaints.  Patient offers no further specific complaints today.  REVIEW OF SYSTEMS:    Review of Systems  Constitutional:  Negative for fever, malaise/fatigue and weight loss.  HENT: Negative.  Negative for congestion.   Respiratory: Negative.  Negative for cough and shortness of breath.   Cardiovascular: Negative.  Negative for chest pain and leg swelling.  Gastrointestinal: Negative.  Negative for blood in stool, melena and nausea.  Genitourinary: Negative.  Negative for hematuria.  Musculoskeletal:  Positive for back pain and joint pain.  Skin: Negative.  Negative for rash.  Neurological: Negative.  Negative for sensory change, focal weakness, weakness and headaches.  Endo/Heme/Allergies:  Does not bruise/bleed easily.  Psychiatric/Behavioral: Negative.  Negative for depression. The patient is not nervous/anxious and does not have insomnia.    As per HPI. Otherwise, a complete review of systems is negative.  PAST MEDICAL HISTORY: Past Medical History:  Diagnosis Date   Hypertension      PAST SURGICAL HISTORY: Past Surgical History:  Procedure Laterality Date   ABDOMINAL HYSTERECTOMY     BREAST BIOPSY Left    age 65 surgical bx neg   CESAREAN SECTION     cyst removed     breast, benign    FAMILY HISTORY: Reviewed and unchanged. No reported history of malignancy or chronic disease.     ADVANCED DIRECTIVES:    HEALTH MAINTENANCE: Social History   Tobacco Use   Smoking status: Some Days    Packs/day: 0.15    Types: Cigarettes   Smokeless tobacco: Never   Tobacco comments:    1 pack takes 3.5-4 weeks to finish   Vaping Use   Vaping Use: Never used  Substance Use Topics   Alcohol use: No   Drug use: No    Allergies  Allergen Reactions   Doxycycline Nausea And Vomiting   Penicillins Hives    Current Outpatient Medications  Medication Sig Dispense Refill   acetaminophen (TYLENOL) 500 MG tablet Take 500 mg by mouth every 6 (six) hours as needed.     amLODipine (NORVASC) 10 MG tablet Take 1 tablet (10 mg total) by mouth daily. 30 tablet 3   baclofen (LIORESAL) 10 MG tablet Take 0.5-1 tablets (5-10 mg total) by mouth 3 (three) times daily as needed for muscle spasms. 30 each 1   cetirizine (ZYRTEC) 10 MG tablet Take 1 tablet (10 mg total) by mouth daily. 30 tablet 11   citalopram (CELEXA) 40 MG tablet Take 40 mg by mouth daily.     clonazePAM (KLONOPIN) 1 MG tablet Take 1-2 mg by mouth 3 (three) times daily as needed. Reported on 10/11/2015     clotrimazole-betamethasone (LOTRISONE)  cream Apply 1 application topically 2 (two) times daily. 45 g 2   diphenhydrAMINE (BENADRYL) 25 mg capsule Take 25 mg by mouth as needed.     fluorouracil (EFUDEX) 5 % cream Apply topically 2 (two) times daily. 40 g 2   fluticasone (FLONASE) 50 MCG/ACT nasal spray Place 2 sprays into both nostrils daily. 16 g 11   hydrochlorothiazide (HYDRODIURIL) 25 MG tablet TAKE 1 TABLET BY MOUTH EVERY DAY 90 tablet 3   mupirocin ointment (BACTROBAN) 2 % Apply 1 application topically  daily. Qd to excision site 22 g 1   ondansetron (ZOFRAN) 4 MG tablet Take 1 tablet (4 mg total) by mouth every 8 (eight) hours as needed for nausea or vomiting. 20 tablet 3   pantoprazole (PROTONIX) 40 MG tablet TAKE 1 TABLET BY MOUTH EVERY DAY BEFORE BREAKFAST 90 tablet 1   predniSONE (DELTASONE) 50 MG tablet Take 1 tablet (50 mg total) by mouth daily with breakfast. 7 tablet 0   VENTOLIN HFA 108 (90 Base) MCG/ACT inhaler INHALE 1-2 PUFFS INTO THE LUNGS EVERY 6 (SIX) HOURS AS NEEDED FOR WHEEZING OR SHORTNESS OF BREATH. 18 g 0   amoxicillin-clavulanate (AUGMENTIN) 875-125 MG tablet Take 1 tablet by mouth 2 (two) times daily. (Patient not taking: No sig reported) 20 tablet 0   predniSONE (DELTASONE) 20 MG tablet Take daily with food. Start with 41m (3 pills) x 2 days, then reduce to 436m(2 pills) x 2 days, then 2074m1 pill) x 3 days (Patient not taking: No sig reported) 13 tablet 0   No current facility-administered medications for this visit.    OBJECTIVE: Vitals:   01/19/21 0945  BP: 122/76  Pulse: 63  Resp: 18  Temp: 98.1 F (36.7 C)  SpO2: 99%     Body mass index is 29.23 kg/m.    ECOG FS:0 - Asymptomatic  General: Well-developed, well-nourished, no acute distress. Eyes: Pink conjunctiva, anicteric sclera. HEENT: Normocephalic, moist mucous membranes. Lungs: No audible wheezing or coughing. Heart: Regular rate and rhythm. Abdomen: Soft, nontender, no obvious distention. Musculoskeletal: No edema, cyanosis, or clubbing. Neuro: Alert, answering all questions appropriately. Cranial nerves grossly intact. Skin: No rashes or petechiae noted. Psych: Normal affect.   LAB RESULTS:  Lab Results  Component Value Date   NA 138 08/17/2016   K 3.6 08/17/2016   CL 101 08/17/2016   CO2 25 08/17/2016   GLUCOSE 81 08/17/2016   BUN 11 08/17/2016   CREATININE 0.88 08/17/2016   CALCIUM 9.4 08/17/2016   PROT 7.3 08/17/2016   ALBUMIN 4.0 08/17/2016   AST 25 08/17/2016   ALT 30 (H)  08/17/2016   ALKPHOS 85 08/17/2016   BILITOT 0.3 08/17/2016   GFRNONAA 64 08/04/2015   GFRAA 74 08/04/2015    Lab Results  Component Value Date   WBC 8.9 01/19/2021   NEUTROABS 6.9 01/19/2021   HGB 14.0 01/19/2021   HCT 41.0 01/19/2021   MCV 85.4 01/19/2021   PLT 72 (L) 01/19/2021     HEMATOLOGY HISTORY: Bone marrow biopsy performed on July 28, 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 Patient responded to steroids, but the results were not durable.  Patient has received weekly Rituxan x4 with excellent results each time completing treatment on the following dates:  1.  Aug 30, 2015. 2.  November 23, 2016. 3.  December 20, 2017.   4.  July 23, 2019.  STUDIES: No results found.  ASSESSMENT: Bone marrow biopsy proven ITP.  PLAN:  1.  ITP: Patient's platelet count recently dropped to 9.  She was placed on a steroid taper and reinitiated weekly Rituxan x4 last week.  Platelet count has improved to 72, but this may be related to steroids.  Proceed with cycle 2 of treatment today.  Return to clinic in 1 week for further evaluation and consideration of cycle 3.   2.  Leukocytosis: Resolved. 3.  Back pain: Chronic and unchanged.  Continue evaluation and treatment per primary care. 4.  Foot pain: Chronic and unchanged.  Continue follow-up with podiatry as scheduled.  I spent a total of 30 minutes reviewing chart data, face-to-face evaluation with the patient, counseling and coordination of care as detailed above.    Patient expressed understanding and was in agreement with this plan. She also understands that She can call clinic at any time with any questions, concerns, or complaints.   Lloyd Huger, MD   01/21/2021 1:58 PM

## 2021-01-18 ENCOUNTER — Other Ambulatory Visit: Payer: Self-pay

## 2021-01-18 DIAGNOSIS — D693 Immune thrombocytopenic purpura: Secondary | ICD-10-CM

## 2021-01-19 ENCOUNTER — Inpatient Hospital Stay (HOSPITAL_BASED_OUTPATIENT_CLINIC_OR_DEPARTMENT_OTHER): Payer: Medicare HMO | Admitting: Oncology

## 2021-01-19 ENCOUNTER — Inpatient Hospital Stay: Payer: Medicare HMO

## 2021-01-19 ENCOUNTER — Other Ambulatory Visit: Payer: Self-pay

## 2021-01-19 VITALS — BP 122/76 | HR 63 | Temp 98.1°F | Resp 18 | Wt 170.3 lb

## 2021-01-19 VITALS — BP 103/54 | HR 65 | Temp 96.2°F | Resp 16

## 2021-01-19 DIAGNOSIS — M79673 Pain in unspecified foot: Secondary | ICD-10-CM | POA: Diagnosis not present

## 2021-01-19 DIAGNOSIS — D693 Immune thrombocytopenic purpura: Secondary | ICD-10-CM

## 2021-01-19 DIAGNOSIS — G8929 Other chronic pain: Secondary | ICD-10-CM | POA: Diagnosis not present

## 2021-01-19 DIAGNOSIS — Z5111 Encounter for antineoplastic chemotherapy: Secondary | ICD-10-CM | POA: Diagnosis not present

## 2021-01-19 DIAGNOSIS — Z79899 Other long term (current) drug therapy: Secondary | ICD-10-CM | POA: Diagnosis not present

## 2021-01-19 DIAGNOSIS — M549 Dorsalgia, unspecified: Secondary | ICD-10-CM | POA: Diagnosis not present

## 2021-01-19 LAB — CBC WITH DIFFERENTIAL/PLATELET
Abs Immature Granulocytes: 0.1 10*3/uL — ABNORMAL HIGH (ref 0.00–0.07)
Basophils Absolute: 0 10*3/uL (ref 0.0–0.1)
Basophils Relative: 1 %
Eosinophils Absolute: 0.1 10*3/uL (ref 0.0–0.5)
Eosinophils Relative: 1 %
HCT: 41 % (ref 36.0–46.0)
Hemoglobin: 14 g/dL (ref 12.0–15.0)
Immature Granulocytes: 1 %
Lymphocytes Relative: 15 %
Lymphs Abs: 1.3 10*3/uL (ref 0.7–4.0)
MCH: 29.2 pg (ref 26.0–34.0)
MCHC: 34.1 g/dL (ref 30.0–36.0)
MCV: 85.4 fL (ref 80.0–100.0)
Monocytes Absolute: 0.5 10*3/uL (ref 0.1–1.0)
Monocytes Relative: 5 %
Neutro Abs: 6.9 10*3/uL (ref 1.7–7.7)
Neutrophils Relative %: 77 %
Platelets: 72 10*3/uL — ABNORMAL LOW (ref 150–400)
RBC: 4.8 MIL/uL (ref 3.87–5.11)
RDW: 14.5 % (ref 11.5–15.5)
WBC: 8.9 10*3/uL (ref 4.0–10.5)
nRBC: 0 % (ref 0.0–0.2)

## 2021-01-19 MED ORDER — SODIUM CHLORIDE 0.9 % IV SOLN
375.0000 mg/m2 | Freq: Once | INTRAVENOUS | Status: AC
  Administered 2021-01-19: 700 mg via INTRAVENOUS
  Filled 2021-01-19: qty 70
  Filled 2021-01-19: qty 50
  Filled 2021-01-19: qty 70

## 2021-01-19 MED ORDER — SODIUM CHLORIDE 0.9 % IV SOLN
Freq: Once | INTRAVENOUS | Status: AC
  Filled 2021-01-19: qty 250

## 2021-01-19 MED ORDER — FAMOTIDINE 20 MG IN NS 100 ML IVPB
20.0000 mg | Freq: Two times a day (BID) | INTRAVENOUS | Status: DC
  Administered 2021-01-19: 20 mg via INTRAVENOUS
  Filled 2021-01-19: qty 100
  Filled 2021-01-19: qty 20

## 2021-01-19 MED ORDER — SODIUM CHLORIDE 0.9 % IV SOLN
20.0000 mg | Freq: Once | INTRAVENOUS | Status: AC
  Administered 2021-01-19: 20 mg via INTRAVENOUS
  Filled 2021-01-19: qty 20

## 2021-01-19 MED ORDER — ACETAMINOPHEN 325 MG PO TABS
650.0000 mg | ORAL_TABLET | Freq: Once | ORAL | Status: AC
  Administered 2021-01-19: 650 mg via ORAL
  Filled 2021-01-19: qty 2

## 2021-01-19 MED ORDER — DIPHENHYDRAMINE HCL 50 MG/ML IJ SOLN
50.0000 mg | Freq: Once | INTRAMUSCULAR | Status: AC
  Administered 2021-01-19: 50 mg via INTRAVENOUS
  Filled 2021-01-19: qty 1

## 2021-01-19 MED ORDER — FAMOTIDINE IN NACL 20-0.9 MG/50ML-% IV SOLN
20.0000 mg | Freq: Two times a day (BID) | INTRAVENOUS | Status: DC
  Filled 2021-01-19 (×2): qty 50

## 2021-01-19 MED ORDER — LORAZEPAM 0.5 MG PO TABS
0.5000 mg | ORAL_TABLET | Freq: Once | ORAL | Status: AC
  Administered 2021-01-19: 0.5 mg via ORAL
  Filled 2021-01-19: qty 1

## 2021-01-19 NOTE — Progress Notes (Signed)
Rituxan administered via slow titration per previous reaction history.  Pt tolerated infusions well today and left the chemo suite stable and ambulatory.

## 2021-01-19 NOTE — Patient Instructions (Signed)
[facility]  Discharge Instructions: Thank you for choosing [facility] to provide your oncology and hematology care.  If you have a lab appointment with the [facility], please go directly to the [facility] and check in at the registration area.  Wear comfortable clothing and clothing appropriate for easy access to any Portacath or PICC line.   We strive to give you quality time with your provider. You may need to reschedule your appointment if you arrive late (15 or more minutes).  Arriving late affects you and other patients whose appointments are after yours.  Also, if you miss three or more appointments without notifying the office, you may be dismissed from the clinic at the provider's discretion.      For prescription refill requests, have your pharmacy contact our office and allow 72 hours for refills to be completed.    Today you received the following chemotherapy and/or immunotherapy agents rituximab      To help prevent nausea and vomiting after your treatment, we encourage you to take your nausea medication as directed.  BELOW ARE SYMPTOMS THAT SHOULD BE REPORTED IMMEDIATELY: *FEVER GREATER THAN 100.4 F (38 C) OR HIGHER *CHILLS OR SWEATING *NAUSEA AND VOMITING THAT IS NOT CONTROLLED WITH YOUR NAUSEA MEDICATION *UNUSUAL SHORTNESS OF BREATH *UNUSUAL BRUISING OR BLEEDING *URINARY PROBLEMS (pain or burning when urinating, or frequent urination) *BOWEL PROBLEMS (unusual diarrhea, constipation, pain near the anus) TENDERNESS IN MOUTH AND THROAT WITH OR WITHOUT PRESENCE OF ULCERS (sore throat, sores in mouth, or a toothache) UNUSUAL RASH, SWELLING OR PAIN  UNUSUAL VAGINAL DISCHARGE OR ITCHING   Items with * indicate a potential emergency and should be followed up as soon as possible or go to the Emergency Department if any problems should occur.  Please show the CHEMOTHERAPY ALERT CARD or IMMUNOTHERAPY ALERT CARD at check-in  to the Emergency Department and triage nurse.  Should you have questions after your visit or need to cancel or reschedule your appointment, please contact [facility]  [phone] and follow the prompts.  Office hours are 8:00 a.m. to 4:30 p.m. Monday - Friday. Please note that voicemails left after 4:00 p.m. may not be returned until the following business day.  We are closed weekends and major holidays. You have access to a nurse at all times for urgent questions. Please call the main number to the clinic [phone] and follow the prompts.  For any non-urgent questions, you may also contact your provider using MyChart. We now offer e-Visits for anyone 22 and older to request care online for non-urgent symptoms. For details visit [email or web address].   Also download the MyChart app! Go to the app store, search "MyChart", open the app, select [facility], and log in with your MyChart username and password.  Due to Covid, a mask is required upon entering the hospital/clinic. If you do not have a mask, one will be given to you upon arrival. For doctor visits, patients may have 1 support person aged 88 or older with them. For treatment visits, patients cannot have anyone with them due to current Covid guidelines and our immunocompromised population.   Rituximab Injection What is this medication? RITUXIMAB (ri TUX i mab) is a monoclonal antibody. It is used to treat certain types of cancer like non-Hodgkin lymphoma and chronic lymphocytic leukemia. It is also used to treat rheumatoid arthritis, granulomatosis with polyangiitis, microscopic polyangiitis, and pemphigus vulgaris. This medicine may be used for other purposes;  ask your health care provider or pharmacist if you have questions. COMMON BRAND NAME(S): RIABNI, Rituxan, RUXIENCE What should I tell my care team before I take this medication? They need to know if you have any of these conditions: chest  pain heart disease infection especially a viral infection such as chickenpox, cold sores, hepatitis B, or herpes immune system problems irregular heartbeat or rhythm kidney disease low blood counts (white cells, platelets, or red cells) lung disease recent or upcoming vaccine an unusual or allergic reaction to rituximab, other medicines, foods, dyes, or preservatives pregnant or trying to get pregnant breast-feeding How should I use this medication? This medicine is injected into a vein. It is given by a health care provider in a hospital or clinic setting. A special MedGuide will be given to you before each treatment. Be sure to read this information carefully each time. Talk to your health care provider about the use of this medicine in children. While this drug may be prescribed for children as young as 6 months for selected conditions, precautions do apply. Overdosage: If you think you have taken too much of this medicine contact a poison control center or emergency room at once. NOTE: This medicine is only for you. Do not share this medicine with others. What if I miss a dose? Keep appointments for follow-up doses. It is important not to miss your dose. Call your health care provider if you are unable to keep an appointment. What may interact with this medication? Do not take this medicine with any of the following medicines: live vaccines This medicine may also interact with the following medicines: cisplatin This list may not describe all possible interactions. Give your health care provider a list of all the medicines, herbs, non-prescription drugs, or dietary supplements you use. Also tell them if you smoke, drink alcohol, or use illegal drugs. Some items may interact with your medicine. What should I watch for while using this medication? Your condition will be monitored carefully while you are receiving this medicine. You may need blood work done while you are taking this  medicine. This medicine can cause serious infusion reactions. To reduce the risk your health care provider may give you other medicines to take before receiving this one. Be sure to follow the directions from your health care provider. This medicine may increase your risk of getting an infection. Call your health care provider for advice if you get a fever, chills, sore throat, or other symptoms of a cold or flu. Do not treat yourself. Try to avoid being around people who are sick. Call your health care provider if you are around anyone with measles, chickenpox, or if you develop sores or blisters that do not heal properly. Avoid taking medicines that contain aspirin, acetaminophen, ibuprofen, naproxen, or ketoprofen unless instructed by your health care provider. These medicines may hide a fever. This medicine may cause serious skin reactions. They can happen weeks to months after starting the medicine. Contact your health care provider right away if you notice fevers or flu-like symptoms with a rash. The rash may be red or purple and then turn into blisters or peeling of the skin. Or, you might notice a red rash with swelling of the face, lips or lymph nodes in your neck or under your arms. In some patients, this medicine may cause a serious brain infection that may cause death. If you have any problems seeing, thinking, speaking, walking, or standing, tell your healthcare professional right away. If you  cannot reach your healthcare professional, urgently seek other source of medical care. Do not become pregnant while taking this medicine or for at least 12 months after stopping it. Women should inform their health care provider if they wish to become pregnant or think they might be pregnant. There is potential for serious harm to an unborn child. Talk to your health care provider for more information. Women should use a reliable form of birth control while taking this medicine and for 12 months after  stopping it. Do not breast-feed while taking this medicine or for at least 6 months after stopping it. What side effects may I notice from receiving this medication? Side effects that you should report to your health care provider as soon as possible: allergic reactions (skin rash, itching or hives; swelling of the face, lips, or tongue) diarrhea edema (sudden weight gain; swelling of the ankles, feet, hands or other unusual swelling; trouble breathing) fast, irregular heartbeat heart attack (trouble breathing; pain or tightness in the chest, neck, back or arms; unusually weak or tired) infection (fever, chills, cough, sore throat, pain or trouble passing urine) kidney injury (trouble passing urine or change in the amount of urine) liver injury (dark yellow or brown urine; general ill feeling or flu-like symptoms; loss of appetite, right upper belly pain; unusually weak or tired, yellowing of the eyes or skin) low blood pressure (dizziness; feeling faint or lightheaded, falls; unusually weak or tired) low red blood cell counts (trouble breathing; feeling faint; lightheaded, falls; unusually weak or tired) mouth sores redness, blistering, peeling, or loosening of the skin, including inside the mouth stomach pain unusual bruising or bleeding wheezing (trouble breathing with loud or whistling sounds) vomiting Side effects that usually do not require medical attention (report to your health care provider if they continue or are bothersome): headache joint pain muscle cramps, pain nausea This list may not describe all possible side effects. Call your doctor for medical advice about side effects. You may report side effects to FDA at 1-800-FDA-1088. Where should I keep my medication? This medicine is given in a hospital or clinic. It will not be stored at home. NOTE: This sheet is a summary. It may not cover all possible information. If you have questions about this medicine, talk to your  doctor, pharmacist, or health care provider.  2022 Elsevier/Gold Standard (2020-04-01 15:47:26)

## 2021-01-19 NOTE — Progress Notes (Signed)
Pt has no concerns at this time. Just wanting to know lab results

## 2021-01-21 ENCOUNTER — Encounter: Payer: Self-pay | Admitting: Oncology

## 2021-01-27 ENCOUNTER — Inpatient Hospital Stay: Payer: Medicare HMO | Attending: Oncology

## 2021-01-27 ENCOUNTER — Inpatient Hospital Stay: Payer: Medicare HMO

## 2021-01-27 VITALS — BP 106/63 | HR 66 | Temp 98.7°F | Resp 17

## 2021-01-27 DIAGNOSIS — Z79899 Other long term (current) drug therapy: Secondary | ICD-10-CM | POA: Diagnosis not present

## 2021-01-27 DIAGNOSIS — D693 Immune thrombocytopenic purpura: Secondary | ICD-10-CM | POA: Insufficient documentation

## 2021-01-27 DIAGNOSIS — Z5111 Encounter for antineoplastic chemotherapy: Secondary | ICD-10-CM | POA: Diagnosis not present

## 2021-01-27 DIAGNOSIS — M797 Fibromyalgia: Secondary | ICD-10-CM | POA: Diagnosis not present

## 2021-01-27 DIAGNOSIS — I10 Essential (primary) hypertension: Secondary | ICD-10-CM | POA: Diagnosis not present

## 2021-01-27 DIAGNOSIS — Z7952 Long term (current) use of systemic steroids: Secondary | ICD-10-CM | POA: Diagnosis not present

## 2021-01-27 LAB — CBC WITH DIFFERENTIAL/PLATELET
Abs Immature Granulocytes: 0.07 10*3/uL (ref 0.00–0.07)
Basophils Absolute: 0 10*3/uL (ref 0.0–0.1)
Basophils Relative: 1 %
Eosinophils Absolute: 0.2 10*3/uL (ref 0.0–0.5)
Eosinophils Relative: 3 %
HCT: 44.6 % (ref 36.0–46.0)
Hemoglobin: 14.8 g/dL (ref 12.0–15.0)
Immature Granulocytes: 1 %
Lymphocytes Relative: 19 %
Lymphs Abs: 1.2 10*3/uL (ref 0.7–4.0)
MCH: 28.9 pg (ref 26.0–34.0)
MCHC: 33.2 g/dL (ref 30.0–36.0)
MCV: 87.1 fL (ref 80.0–100.0)
Monocytes Absolute: 0.3 10*3/uL (ref 0.1–1.0)
Monocytes Relative: 5 %
Neutro Abs: 4.5 10*3/uL (ref 1.7–7.7)
Neutrophils Relative %: 71 %
Platelets: 68 10*3/uL — ABNORMAL LOW (ref 150–400)
RBC: 5.12 MIL/uL — ABNORMAL HIGH (ref 3.87–5.11)
RDW: 14.7 % (ref 11.5–15.5)
WBC: 6.2 10*3/uL (ref 4.0–10.5)
nRBC: 0 % (ref 0.0–0.2)

## 2021-01-27 MED ORDER — FAMOTIDINE 20 MG IN NS 100 ML IVPB
20.0000 mg | Freq: Two times a day (BID) | INTRAVENOUS | Status: DC
  Administered 2021-01-27: 20 mg via INTRAVENOUS
  Filled 2021-01-27: qty 20
  Filled 2021-01-27: qty 100

## 2021-01-27 MED ORDER — ACETAMINOPHEN 325 MG PO TABS
650.0000 mg | ORAL_TABLET | Freq: Once | ORAL | Status: AC
  Administered 2021-01-27: 650 mg via ORAL
  Filled 2021-01-27: qty 2

## 2021-01-27 MED ORDER — SODIUM CHLORIDE 0.9 % IV SOLN
375.0000 mg/m2 | Freq: Once | INTRAVENOUS | Status: AC
  Administered 2021-01-27: 700 mg via INTRAVENOUS
  Filled 2021-01-27: qty 50

## 2021-01-27 MED ORDER — DIPHENHYDRAMINE HCL 50 MG/ML IJ SOLN
50.0000 mg | Freq: Once | INTRAMUSCULAR | Status: AC
Start: 2021-01-27 — End: 2021-01-27
  Administered 2021-01-27: 50 mg via INTRAVENOUS
  Filled 2021-01-27: qty 1

## 2021-01-27 MED ORDER — LORAZEPAM 2 MG/ML IJ SOLN
0.5000 mg | Freq: Once | INTRAMUSCULAR | Status: AC
  Administered 2021-01-27: 0.5 mg via INTRAVENOUS
  Filled 2021-01-27: qty 1

## 2021-01-27 MED ORDER — SODIUM CHLORIDE 0.9 % IV SOLN
Freq: Once | INTRAVENOUS | Status: AC
  Filled 2021-01-27: qty 250

## 2021-01-27 MED ORDER — FAMOTIDINE IN NACL 20-0.9 MG/50ML-% IV SOLN
20.0000 mg | Freq: Two times a day (BID) | INTRAVENOUS | Status: DC
  Filled 2021-01-27 (×2): qty 50

## 2021-01-27 MED ORDER — SODIUM CHLORIDE 0.9 % IV SOLN
20.0000 mg | Freq: Once | INTRAVENOUS | Status: AC
  Administered 2021-01-27: 20 mg via INTRAVENOUS
  Filled 2021-01-27: qty 20

## 2021-01-27 NOTE — Patient Instructions (Signed)
[facility]  Discharge Instructions: Thank you for choosing [facility] to provide your oncology and hematology care.  If you have a lab appointment with the [facility], please go directly to the [facility] and check in at the registration area.  Wear comfortable clothing and clothing appropriate for easy access to any Portacath or PICC line.   We strive to give you quality time with your provider. You may need to reschedule your appointment if you arrive late (15 or more minutes).  Arriving late affects you and other patients whose appointments are after yours.  Also, if you miss three or more appointments without notifying the office, you may be dismissed from the clinic at the provider's discretion.      For prescription refill requests, have your pharmacy contact our office and allow 72 hours for refills to be completed.    Today you received the following chemotherapy and/or immunotherapy agents Rituximab       To help prevent nausea and vomiting after your treatment, we encourage you to take your nausea medication as directed.  BELOW ARE SYMPTOMS THAT SHOULD BE REPORTED IMMEDIATELY: *FEVER GREATER THAN 100.4 F (38 C) OR HIGHER *CHILLS OR SWEATING *NAUSEA AND VOMITING THAT IS NOT CONTROLLED WITH YOUR NAUSEA MEDICATION *UNUSUAL SHORTNESS OF BREATH *UNUSUAL BRUISING OR BLEEDING *URINARY PROBLEMS (pain or burning when urinating, or frequent urination) *BOWEL PROBLEMS (unusual diarrhea, constipation, pain near the anus) TENDERNESS IN MOUTH AND THROAT WITH OR WITHOUT PRESENCE OF ULCERS (sore throat, sores in mouth, or a toothache) UNUSUAL RASH, SWELLING OR PAIN  UNUSUAL VAGINAL DISCHARGE OR ITCHING   Items with * indicate a potential emergency and should be followed up as soon as possible or go to the Emergency Department if any problems should occur.  Please show the CHEMOTHERAPY ALERT CARD or IMMUNOTHERAPY ALERT CARD at check-in  to the Emergency Department and triage nurse.  Should you have questions after your visit or need to cancel or reschedule your appointment, please contact [facility]  [phone] and follow the prompts.  Office hours are 8:00 a.m. to 4:30 p.m. Monday - Friday. Please note that voicemails left after 4:00 p.m. may not be returned until the following business day.  We are closed weekends and major holidays. You have access to a nurse at all times for urgent questions. Please call the main number to the clinic [phone] and follow the prompts.  For any non-urgent questions, you may also contact your provider using MyChart. We now offer e-Visits for anyone 44 and older to request care online for non-urgent symptoms. For details visit [email or web address].   Also download the MyChart app! Go to the app store, search "MyChart", open the app, select [facility], and log in with your MyChart username and password.  Due to Covid, a mask is required upon entering the hospital/clinic. If you do not have a mask, one will be given to you upon arrival. For doctor visits, patients may have 1 support person aged 59 or older with them. For treatment visits, patients cannot have anyone with them due to current Covid guidelines and our immunocompromised population.

## 2021-01-27 NOTE — Progress Notes (Signed)
PLTs 70, pt states that since last treatment she has felt like something was "crawling all in me," causing her to have an "itch that I just can't get to" and has been nauseous. She states it was worse this time than previous times. Per Dr. Grayland Ormond okay to proceed with treatment, pt to take Benadryl as needed (following directions listed on medication box) Pt agrees with plan.  Per Dr. Grayland Ormond okay to give IV Ativan 0.5 mg for nausea.   1440: Pt tolerated treatment well. Pt and VS stable at discharge.

## 2021-02-02 NOTE — Progress Notes (Signed)
[facility]  Telephone:(336) [phone] Fax:(336) [phone]  ID: Venba Zenner Furuya OB: [birth year]  MR#: [id number]  ZCH#:[id number]  Patient Care Team: Olin Hauser, DO as PCP - General (Family Medicine) Chucky May, MD as Consulting Physician (Psychiatry) Lloyd Huger, MD as Consulting Physician (Oncology) Greg Cutter, LCSW as Social Worker (Licensed Clinical Social Worker)  CHIEF COMPLAINT: ITP.  INTERVAL HISTORY: Mrs. 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  She is status post 3/4 doses of weekly Rituxan.  She is here for evaluation prior to next Rituxan.  She has no knew concerns today. She feels well.   REVIEW OF SYSTEMS:    Review of Systems  All other systems reviewed and are negative.  As per HPI. Otherwise, a complete review of systems is negative.  PAST MEDICAL HISTORY: Past Medical History:  Diagnosis Date   Hypertension     PAST SURGICAL HISTORY: Past Surgical History:  Procedure Laterality Date   ABDOMINAL HYSTERECTOMY     BREAST BIOPSY Left    age 5 surgical bx neg   CESAREAN SECTION     cyst removed     breast, benign    FAMILY HISTORY: Reviewed and unchanged. No reported history of malignancy or chronic disease.     ADVANCED DIRECTIVES:    HEALTH MAINTENANCE: Social History   Tobacco Use   Smoking status: Some Days    Packs/day: 0.15    Types: Cigarettes   Smokeless tobacco: Never   Tobacco comments:    1 pack takes 3.5-4 weeks to finish   Vaping Use   Vaping Use: Never used  Substance Use Topics   Alcohol use: No   Drug use: No    Allergies  Allergen Reactions   Doxycycline Nausea And Vomiting   Penicillins Hives    Current Outpatient Medications  Medication Sig Dispense Refill   acetaminophen (TYLENOL) 500  MG tablet Take 500 mg by mouth every 6 (six) hours as needed.     amLODipine (NORVASC) 10 MG tablet Take 1 tablet (10 mg total) by mouth daily. 30 tablet 3   amoxicillin-clavulanate (AUGMENTIN) 875-125 MG tablet Take 1 tablet by mouth 2 (two) times daily. (Patient not taking: No sig reported) 20 tablet 0   baclofen (LIORESAL) 10 MG tablet Take 0.5-1 tablets (5-10 mg total) by mouth 3 (three) times daily as needed for muscle spasms. 30 each 1   cetirizine (ZYRTEC) 10 MG tablet Take 1 tablet (10 mg total) by mouth daily. 30 tablet 11   citalopram (CELEXA) 40 MG tablet Take 40 mg by mouth daily.     clonazePAM (KLONOPIN) 1 MG tablet Take 1-2 mg by mouth 3 (three) times daily as needed. Reported on 10/11/2015     clotrimazole-betamethasone (LOTRISONE) cream Apply 1 application topically 2 (two) times daily. 45 g 2   diphenhydrAMINE (BENADRYL) 25 mg capsule Take 25 mg by mouth as needed.     fluorouracil (EFUDEX) 5 % cream Apply topically 2 (two) times daily. 40 g 2   fluticasone (FLONASE) 50 MCG/ACT nasal spray Place 2 sprays into both nostrils daily. 16 g 11   hydrochlorothiazide (HYDRODIURIL) 25 MG tablet TAKE 1 TABLET BY MOUTH EVERY DAY 90 tablet 3   mupirocin ointment (BACTROBAN) 2 % Apply 1 application topically daily. Qd to excision site 22 g 1   ondansetron (ZOFRAN) 4 MG tablet Take 1  tablet (4 mg total) by mouth every 8 (eight) hours as needed for nausea or vomiting. 20 tablet 3   pantoprazole (PROTONIX) 40 MG tablet TAKE 1 TABLET BY MOUTH EVERY DAY BEFORE BREAKFAST 90 tablet 1   predniSONE (DELTASONE) 20 MG tablet Take daily with food. Start with 14m (3 pills) x 2 days, then reduce to 426m(2 pills) x 2 days, then 208m1 pill) x 3 days (Patient not taking: No sig reported) 13 tablet 0   predniSONE (DELTASONE) 50 MG tablet Take 1 tablet (50 mg total) by mouth daily with breakfast. 7 tablet 0   VENTOLIN HFA 108 (90 Base) MCG/ACT inhaler INHALE 1-2 PUFFS INTO THE LUNGS EVERY 6 (SIX) HOURS AS  NEEDED FOR WHEEZING OR SHORTNESS OF BREATH. 18 g 0   No current facility-administered medications for this visit.    OBJECTIVE: There were no vitals filed for this visit.    There is no height or weight on file to calculate BMI.    ECOG FS:0 - Asymptomatic  Physical Exam Constitutional:      Appearance: Normal appearance.  HENT:     Head: Normocephalic and atraumatic.  Eyes:     Pupils: Pupils are equal, round, and reactive to light.  Cardiovascular:     Rate and Rhythm: Normal rate and regular rhythm.     Heart sounds: Normal heart sounds. No murmur heard. Pulmonary:     Effort: Pulmonary effort is normal.     Breath sounds: Normal breath sounds. No wheezing.  Abdominal:     General: Bowel sounds are normal. There is no distension.     Palpations: Abdomen is soft.     Tenderness: There is no abdominal tenderness.  Musculoskeletal:        General: Normal range of motion.     Cervical back: Normal range of motion.  Skin:    General: Skin is warm and dry.     Findings: No rash.  Neurological:     Mental Status: She is alert and oriented to person, place, and time.  Psychiatric:        Judgment: Judgment normal.     LAB RESULTS:  Lab Results  Component Value Date   NA 138 08/17/2016   K 3.6 08/17/2016   CL 101 08/17/2016   CO2 25 08/17/2016   GLUCOSE 81 08/17/2016   BUN 11 08/17/2016   CREATININE 0.88 08/17/2016   CALCIUM 9.4 08/17/2016   PROT 7.3 08/17/2016   ALBUMIN 4.0 08/17/2016   AST 25 08/17/2016   ALT 30 (H) 08/17/2016   ALKPHOS 85 08/17/2016   BILITOT 0.3 08/17/2016   GFRNONAA 64 08/04/2015   GFRAA 74 08/04/2015    Lab Results  Component Value Date   WBC 6.2 01/27/2021   NEUTROABS 4.5 01/27/2021   HGB 14.8 01/27/2021   HCT 44.6 01/27/2021   MCV 87.1 01/27/2021   PLT 68 (L) 01/27/2021     HEMATOLOGY HISTORY: Bone marrow biopsy performed on July 28, 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 Patient responded to  steroids, but the results were not durable.  Patient has received weekly Rituxan x4 with excellent results each time completing treatment on the following dates:  1.  Aug 30, 2015. 2.  November 23, 2016. 3.  December 20, 2017.   4.  July 23, 2019.  STUDIES: No results found.  ASSESSMENT: Bone marrow biopsy proven ITP.  PLAN:    1.  ITP:  Initially had bone marrow  back in 2017 reported as normal confirming diagnosis of ITP.  Initially she responded to steroids but it was not durable.  She received 4 doses of Rituxan periodically from 08/30/2015-07/23/2019 with excellent response.  More recently, platelets continue to drop requiring additional treatment.  She has completed 3 out of 4 Rituxan and is here today for her last treatment.  Labs from today show platelets at 67,000 which is stable from previous.  Otherwise CBC is unremarkable.  Disposition: Rituxan today.  Return to clinic in 4 and 8 weeks for lab work only CBC with differential and in 12 weeks for lab work and MD assessment.  I spent 20 minutes dedicated to the care of this patient (face-to-face and non-face-to-face) on the date of the encounter to include what is described in the assessment and plan.  Patient expressed understanding and was in agreement with this plan. She also understands that She can call clinic at any time with any questions, concerns, or complaints.   Jacquelin Hawking, NP   02/02/2021 1:31 PM

## 2021-02-03 ENCOUNTER — Inpatient Hospital Stay: Payer: Medicare HMO

## 2021-02-03 ENCOUNTER — Encounter: Payer: Self-pay | Admitting: Oncology

## 2021-02-03 ENCOUNTER — Other Ambulatory Visit: Payer: Self-pay

## 2021-02-03 ENCOUNTER — Inpatient Hospital Stay (HOSPITAL_BASED_OUTPATIENT_CLINIC_OR_DEPARTMENT_OTHER): Payer: Medicare HMO | Admitting: Oncology

## 2021-02-03 VITALS — BP 113/65 | HR 72 | Temp 97.4°F | Resp 16

## 2021-02-03 VITALS — BP 113/78 | HR 60 | Temp 96.1°F | Resp 16 | Wt 173.0 lb

## 2021-02-03 DIAGNOSIS — D693 Immune thrombocytopenic purpura: Secondary | ICD-10-CM

## 2021-02-03 DIAGNOSIS — M797 Fibromyalgia: Secondary | ICD-10-CM | POA: Diagnosis not present

## 2021-02-03 DIAGNOSIS — Z7952 Long term (current) use of systemic steroids: Secondary | ICD-10-CM | POA: Diagnosis not present

## 2021-02-03 DIAGNOSIS — I10 Essential (primary) hypertension: Secondary | ICD-10-CM | POA: Diagnosis not present

## 2021-02-03 DIAGNOSIS — Z5111 Encounter for antineoplastic chemotherapy: Secondary | ICD-10-CM | POA: Diagnosis not present

## 2021-02-03 DIAGNOSIS — Z79899 Other long term (current) drug therapy: Secondary | ICD-10-CM | POA: Diagnosis not present

## 2021-02-03 LAB — CBC WITH DIFFERENTIAL/PLATELET
Abs Immature Granulocytes: 0.1 10*3/uL — ABNORMAL HIGH (ref 0.00–0.07)
Basophils Absolute: 0 10*3/uL (ref 0.0–0.1)
Basophils Relative: 1 %
Eosinophils Absolute: 0.3 10*3/uL (ref 0.0–0.5)
Eosinophils Relative: 4 %
HCT: 42.8 % (ref 36.0–46.0)
Hemoglobin: 14.2 g/dL (ref 12.0–15.0)
Immature Granulocytes: 2 %
Lymphocytes Relative: 19 %
Lymphs Abs: 1.2 10*3/uL (ref 0.7–4.0)
MCH: 29 pg (ref 26.0–34.0)
MCHC: 33.2 g/dL (ref 30.0–36.0)
MCV: 87.5 fL (ref 80.0–100.0)
Monocytes Absolute: 0.3 10*3/uL (ref 0.1–1.0)
Monocytes Relative: 5 %
Neutro Abs: 4.2 10*3/uL (ref 1.7–7.7)
Neutrophils Relative %: 69 %
Platelets: 67 10*3/uL — ABNORMAL LOW (ref 150–400)
RBC: 4.89 MIL/uL (ref 3.87–5.11)
RDW: 14.9 % (ref 11.5–15.5)
WBC: 6.1 10*3/uL (ref 4.0–10.5)
nRBC: 0 % (ref 0.0–0.2)

## 2021-02-03 MED ORDER — SODIUM CHLORIDE 0.9 % IV SOLN
20.0000 mg | Freq: Once | INTRAVENOUS | Status: AC
  Administered 2021-02-03: 20 mg via INTRAVENOUS
  Filled 2021-02-03: qty 20

## 2021-02-03 MED ORDER — ACETAMINOPHEN 325 MG PO TABS
650.0000 mg | ORAL_TABLET | Freq: Once | ORAL | Status: AC
  Administered 2021-02-03: 650 mg via ORAL
  Filled 2021-02-03: qty 2

## 2021-02-03 MED ORDER — FAMOTIDINE 20 MG IN NS 100 ML IVPB
20.0000 mg | Freq: Once | INTRAVENOUS | Status: AC
  Administered 2021-02-03: 20 mg via INTRAVENOUS
  Filled 2021-02-03: qty 100
  Filled 2021-02-03: qty 20

## 2021-02-03 MED ORDER — SODIUM CHLORIDE 0.9 % IV SOLN
375.0000 mg/m2 | Freq: Once | INTRAVENOUS | Status: AC
  Administered 2021-02-03: 700 mg via INTRAVENOUS
  Filled 2021-02-03: qty 50

## 2021-02-03 MED ORDER — DIPHENHYDRAMINE HCL 50 MG/ML IJ SOLN
50.0000 mg | Freq: Once | INTRAMUSCULAR | Status: AC
  Administered 2021-02-03: 50 mg via INTRAVENOUS
  Filled 2021-02-03: qty 1

## 2021-02-03 MED ORDER — SODIUM CHLORIDE 0.9 % IV SOLN
Freq: Once | INTRAVENOUS | Status: AC
Start: 2021-02-03 — End: 2021-02-03
  Filled 2021-02-03: qty 250

## 2021-02-03 NOTE — Progress Notes (Signed)
Patient is feeling nausea today.  Lower back pain for the past few days, 8/10 pain.

## 2021-02-03 NOTE — Patient Instructions (Signed)
[facility]  Discharge Instructions: Thank you for choosing [facility] to provide your oncology and hematology care.  If you have a lab appointment with the [facility], please go directly to the [facility] and check in at the registration area.  Wear comfortable clothing and clothing appropriate for easy access to any Portacath or PICC line.   We strive to give you quality time with your provider. You may need to reschedule your appointment if you arrive late (15 or more minutes).  Arriving late affects you and other patients whose appointments are after yours.  Also, if you miss three or more appointments without notifying the office, you may be dismissed from the clinic at the provider's discretion.      For prescription refill requests, have your pharmacy contact our office and allow 72 hours for refills to be completed.    Today you received the following chemotherapy and/or immunotherapy agents : Rituxan    To help prevent nausea and vomiting after your treatment, we encourage you to take your nausea medication as directed.  BELOW ARE SYMPTOMS THAT SHOULD BE REPORTED IMMEDIATELY: *FEVER GREATER THAN 100.4 F (38 C) OR HIGHER *CHILLS OR SWEATING *NAUSEA AND VOMITING THAT IS NOT CONTROLLED WITH YOUR NAUSEA MEDICATION *UNUSUAL SHORTNESS OF BREATH *UNUSUAL BRUISING OR BLEEDING *URINARY PROBLEMS (pain or burning when urinating, or frequent urination) *BOWEL PROBLEMS (unusual diarrhea, constipation, pain near the anus) TENDERNESS IN MOUTH AND THROAT WITH OR WITHOUT PRESENCE OF ULCERS (sore throat, sores in mouth, or a toothache) UNUSUAL RASH, SWELLING OR PAIN  UNUSUAL VAGINAL DISCHARGE OR ITCHING   Items with * indicate a potential emergency and should be followed up as soon as possible or go to the Emergency Department if any problems should occur.  Please show the CHEMOTHERAPY ALERT CARD or IMMUNOTHERAPY ALERT CARD at check-in to  the Emergency Department and triage nurse.  Should you have questions after your visit or need to cancel or reschedule your appointment, please contact [facility]  [phone] and follow the prompts.  Office hours are 8:00 a.m. to 4:30 p.m. Monday - Friday. Please note that voicemails left after 4:00 p.m. may not be returned until the following business day.  We are closed weekends and major holidays. You have access to a nurse at all times for urgent questions. Please call the main number to the clinic [phone] and follow the prompts.  For any non-urgent questions, you may also contact your provider using MyChart. We now offer e-Visits for anyone 63 and older to request care online for non-urgent symptoms. For details visit [email or web address].   Also download the MyChart app! Go to the app store, search "MyChart", open the app, select [facility], and log in with your MyChart username and password.  Due to Covid, a mask is required upon entering the hospital/clinic. If you do not have a mask, one will be given to you upon arrival. For doctor visits, patients may have 1 support person aged 63 or older with them. For treatment visits, patients cannot have anyone with them due to current Covid guidelines and our immunocompromised population.

## 2021-02-15 ENCOUNTER — Encounter: Payer: Self-pay | Admitting: Family Medicine

## 2021-02-15 ENCOUNTER — Telehealth: Payer: Self-pay | Admitting: Family Medicine

## 2021-02-15 ENCOUNTER — Telehealth (INDEPENDENT_AMBULATORY_CARE_PROVIDER_SITE_OTHER): Payer: Medicare HMO | Admitting: Family Medicine

## 2021-02-15 VITALS — Ht 64.0 in | Wt 167.0 lb

## 2021-02-15 DIAGNOSIS — R112 Nausea with vomiting, unspecified: Secondary | ICD-10-CM | POA: Diagnosis not present

## 2021-02-15 DIAGNOSIS — J0110 Acute frontal sinusitis, unspecified: Secondary | ICD-10-CM

## 2021-02-15 MED ORDER — ONDANSETRON HCL 4 MG PO TABS: 4.0000 mg | ORAL_TABLET | Freq: Three times a day (TID) | ORAL | 2 refills | Status: DC | PRN

## 2021-02-15 MED ORDER — PREDNISONE 20 MG PO TABS: ORAL_TABLET | ORAL | 0 refills | Status: DC

## 2021-02-15 MED ORDER — AMOXICILLIN-POT CLAVULANATE 875-125 MG PO TABS: 1.0000 | ORAL_TABLET | Freq: Two times a day (BID) | ORAL | 0 refills | Status: DC

## 2021-02-15 NOTE — Progress Notes (Signed)
Subjective:    Patient ID: Erica Bartlett, female    DOB: [birth year], 65 y.o.   MRN: [id number]  Erica Bartlett is a 65 y.o. female presenting on 02/15/2021 for Sinusitis  Virtual / Telehealth Encounter - Video Visit via MyChart The purpose of this virtual visit is to provide medical care while limiting exposure to the novel coronavirus (COVID19) for both patient and office staff.  Consent was obtained for remote visit:  Yes.   Answered questions that patient had about telehealth interaction:  Yes.   I discussed the limitations, risks, security and privacy concerns of performing an evaluation and management service by video/telephone. I also discussed with the patient that there may be a patient responsible charge related to this service. The patient expressed understanding and agreed to proceed.  Patient Location: Home Provider Location: [facility] (Office)  Participants in virtual visit: - Patient: Erica Bartlett - CMA: Orinda Kenner, CMA - Provider: Dr Parks Ranger   HPI  Acute Sinusitis Last treatment 11/30/20 Augmentin and prednisone has worked and was successful Updates now just finished chemotherapy and causes her immune system to be weaker She has upcoming important event with grandson's senior night She has done well with steroid course and antibiotic for current sinus infection. Has not been back to ENT lately due to cost and limited other options there Now with acute onset sinus pressure pain drainage, thicker yellow drainage, occasional cough Prior visits for sinusitis / history of compromised immune system.   Depression screen [facility] 2/9 08/19/2020 06/17/2019 03/28/2019  Decreased Interest 0 0 0  Down, Depressed, Hopeless 0 0 0  PHQ - 2 Score 0 0 0  Altered sleeping - - -  Tired, decreased energy - - -  Change in appetite - - -  Feeling bad or failure about yourself  - - -  Trouble concentrating - - -  Moving slowly or  fidgety/restless - - -  Suicidal thoughts - - -  PHQ-9 Score - - -  Difficult doing work/chores Not difficult at all - -  Some recent data might be hidden    Social History   Tobacco Use   Smoking status: Some Days    Packs/day: 0.15    Types: Cigarettes   Smokeless tobacco: Never   Tobacco comments:    1 pack takes 3.5-4 weeks to finish   Vaping Use   Vaping Use: Never used  Substance Use Topics   Alcohol use: No   Drug use: No    Review of Systems Per HPI unless specifically indicated above     Objective:    Ht 5\' 4"  (1.626 m)   Wt 167 lb (75.8 kg)   BMI 28.67 kg/m   Wt Readings from Last 3 Encounters:  02/15/21 167 lb (75.8 kg)  02/03/21 173 lb (78.5 kg)  01/19/21 170 lb 4.8 oz (77.2 kg)    Physical Exam  Note examination was completely remotely via video observation objective data only  Gen - well-appearing, no acute distress or apparent pain, comfortable HEENT - eyes appear clear without discharge or redness Heart/Lungs - cannot examine virtually - observed no evidence of coughing or labored breathing. Abd - cannot examine virtually  Skin - face visible today- no rash Neuro - awake, alert, oriented Psych - not anxious appearing   Results for orders placed or performed in visit on 02/03/21  CBC with Differential/Platelet  Result Value Ref Range   WBC 6.1 4.0 - 10.5 K/uL  RBC 4.89 3.87 - 5.11 MIL/uL   Hemoglobin 14.2 12.0 - 15.0 g/dL   HCT 42.8 36.0 - 46.0 %   MCV 87.5 80.0 - 100.0 fL   MCH 29.0 26.0 - 34.0 pg   MCHC 33.2 30.0 - 36.0 g/dL   RDW 14.9 11.5 - 15.5 %   Platelets 67 (L) 150 - 400 K/uL   nRBC 0.0 0.0 - 0.2 %   Neutrophils Relative % 69 %   Neutro Abs 4.2 1.7 - 7.7 K/uL   Lymphocytes Relative 19 %   Lymphs Abs 1.2 0.7 - 4.0 K/uL   Monocytes Relative 5 %   Monocytes Absolute 0.3 0.1 - 1.0 K/uL   Eosinophils Relative 4 %   Eosinophils Absolute 0.3 0.0 - 0.5 K/uL   Basophils Relative 1 %   Basophils Absolute 0.0 0.0 - 0.1 K/uL    Immature Granulocytes 2 %   Abs Immature Granulocytes 0.10 (H) 0.00 - 0.07 K/uL      Assessment & Plan:   Problem List Items Addressed This Visit   None Visit Diagnoses     Acute non-recurrent frontal sinusitis    -  Primary   Relevant Medications   predniSONE (DELTASONE) 20 MG tablet   amoxicillin-clavulanate (AUGMENTIN) 875-125 MG tablet   Intractable vomiting with nausea       Relevant Medications   ondansetron (ZOFRAN) 4 MG tablet       Consistent with acute frontal sinusitis, with worsening concern for bacterial infection given immunocompromised history   Plan: 1. Rx Augmentin antibiotic BID x 10 days 2. Start Prednisone taper for spine and sinus 3. Refill Zofran ODT for nausea  Continue allergy sinus meds Return criteria reviewed  Meds ordered this encounter  Medications   predniSONE (DELTASONE) 20 MG tablet    Sig: Take daily with food. Start with 60mg  (3 pills) x 2 days, then reduce to 40mg  (2 pills) x 2 days, then 20mg  (1 pill) x 3 days    Dispense:  13 tablet    Refill:  0   amoxicillin-clavulanate (AUGMENTIN) 875-125 MG tablet    Sig: Take 1 tablet by mouth 2 (two) times daily.    Dispense:  20 tablet    Refill:  0   ondansetron (ZOFRAN) 4 MG tablet    Sig: Take 1 tablet (4 mg total) by mouth every 8 (eight) hours as needed for nausea or vomiting.    Dispense:  60 tablet    Refill:  2      Follow up plan: Return if symptoms worsen or fail to improve.  Patient verbalizes understanding with the above medical recommendations including the limitation of remote medical advice.  Specific follow-up and call-back criteria were given for patient to follow-up or seek medical care more urgently if needed.  Total duration of direct patient care provided via video conference: 8 minutes   [name], [facility] 02/15/2021, 11:41 AM

## 2021-02-15 NOTE — Telephone Encounter (Signed)
Pt calling to follow up on [facility] med change request.  Pt wants to pick up all her prescriptions at the same time, so wanted to know if Dr Raliegh Ip had done this yet. Please advise.

## 2021-02-15 NOTE — Telephone Encounter (Signed)
Med is backordered and is unavailable. Routing to [facility].

## 2021-02-15 NOTE — Patient Instructions (Addendum)
Start Augmentin Start Prednisone Refilled Zofran for nausea  Please schedule a Follow-up Appointment to: Return if symptoms worsen or fail to improve.  If you have any other questions or concerns, please feel free to call the office or send a message through [facility]. You may also schedule an earlier appointment if necessary.  Additionally, you may be receiving a survey about your experience at our office within a few days to 1 week by e-mail or mail. We value your feedback.  [name], DO [facility]

## 2021-02-15 NOTE — Telephone Encounter (Signed)
Requested medication (s) are due for refill today: yes  Requested medication (s) are on the active medication list: yes  Last refill:  today 02/15/21  Future visit scheduled: no  Notes to clinic:  product backordered/ unavailable Med not assigned to a protocol    Requested Prescriptions  Pending Prescriptions Disp Refills   amoxicillin-clavulanate (AUGMENTIN) 875-125 MG tablet [Pharmacy Med Name: AMOXICILLIN-CLAV 875-125MG  TAB] 20 tablet 0    Sig: TAKE 1 TABLET BY MOUTH TWICE A DAY     Off-Protocol Failed - 02/15/2021  3:35 PM      Failed - Medication not assigned to a protocol, review manually.      Passed - Valid encounter within last 12 months    Recent Outpatient Visits           Today Acute non-recurrent frontal sinusitis   [facility] [place], Devonne Doughty, DO   2 months ago Acute non-recurrent maxillary sinusitis   [facility] [place], Devonne Doughty, DO   6 months ago Acute non-recurrent frontal sinusitis   [facility] Olin Hauser, [place]   11 months ago Acute recurrent frontal sinusitis   [place], DO   1 year ago Acute recurrent frontal sinusitis   [place], Devonne Doughty, [place]

## 2021-02-16 MED ORDER — AZITHROMYCIN 250 MG PO TABS: ORAL_TABLET | ORAL | 0 refills | Status: DC

## 2021-02-16 NOTE — Telephone Encounter (Signed)
Pt called to state that she is still without her antibiotic, please advise

## 2021-02-16 NOTE — Telephone Encounter (Signed)
Requested medication (s) are due for refill today: Yes  Requested medication (s) are on the active medication list: Yes  Last refill:  02/15/21  Future visit scheduled: No  Notes to clinic:  See highlighted on Rx by pharmacy, current medication on back order.     Requested Prescriptions  Pending Prescriptions Disp Refills   amoxicillin-clavulanate (AUGMENTIN) 875-125 MG tablet [Pharmacy Med Name: AMOXICILLIN-CLAV 875-125MG  TAB] 20 tablet 0    Sig: TAKE 1 TABLET BY MOUTH TWICE A DAY     Off-Protocol Failed - 02/16/2021 10:58 AM      Failed - Medication not assigned to a protocol, review manually.      Passed - Valid encounter within last 12 months    Recent Outpatient Visits           Yesterday Acute non-recurrent frontal sinusitis   [facility] [place], Devonne Doughty, DO   2 months ago Acute non-recurrent maxillary sinusitis   [facility] [place], Devonne Doughty, DO   6 months ago Acute non-recurrent frontal sinusitis   [facility] Olin Hauser, [place]   11 months ago Acute recurrent frontal sinusitis   [place], [place]   1 year ago Acute recurrent frontal sinusitis   [place], Devonne Doughty, [place]

## 2021-02-17 MED ORDER — CEFDINIR 300 MG PO CAPS: 300.0000 mg | ORAL_CAPSULE | Freq: Two times a day (BID) | ORAL | 0 refills | Status: DC

## 2021-02-17 NOTE — Telephone Encounter (Signed)
Omnicef sent to pharmacy.  She has a penicillin allergy which means there could be a slight cross sensitivity.  With her reaction being hives I opted to send this in anyway.  If she breaks out in a rash or hives she should stop medication let me know.  She did not want Z-Pak and she is allergic to doxycycline.

## 2021-02-17 NOTE — Telephone Encounter (Signed)
Pt called saying she does not want a zpac.  She said it is not strong enough to get rid of her infection.  She would like some thing else sent to the pharmacy.  CB#  [phone]

## 2021-02-17 NOTE — Addendum Note (Signed)
Addended by: Jearld Fenton on: 02/17/2021 12:56 PM   Modules accepted: Orders

## 2021-03-03 ENCOUNTER — Other Ambulatory Visit: Payer: Self-pay

## 2021-03-03 ENCOUNTER — Inpatient Hospital Stay: Payer: Medicare HMO | Attending: Oncology

## 2021-03-03 DIAGNOSIS — D693 Immune thrombocytopenic purpura: Secondary | ICD-10-CM | POA: Insufficient documentation

## 2021-03-03 LAB — CBC WITH DIFFERENTIAL/PLATELET
Abs Immature Granulocytes: 0.1 10*3/uL — ABNORMAL HIGH (ref 0.00–0.07)
Basophils Absolute: 0 10*3/uL (ref 0.0–0.1)
Basophils Relative: 1 %
Eosinophils Absolute: 0.2 10*3/uL (ref 0.0–0.5)
Eosinophils Relative: 2 %
HCT: 42.9 % (ref 36.0–46.0)
Hemoglobin: 14.6 g/dL (ref 12.0–15.0)
Immature Granulocytes: 1 %
Lymphocytes Relative: 16 %
Lymphs Abs: 1.3 10*3/uL (ref 0.7–4.0)
MCH: 29.1 pg (ref 26.0–34.0)
MCHC: 34 g/dL (ref 30.0–36.0)
MCV: 85.6 fL (ref 80.0–100.0)
Monocytes Absolute: 0.5 10*3/uL (ref 0.1–1.0)
Monocytes Relative: 6 %
Neutro Abs: 6.1 10*3/uL (ref 1.7–7.7)
Neutrophils Relative %: 74 %
Platelets: 110 10*3/uL — ABNORMAL LOW (ref 150–400)
RBC: 5.01 MIL/uL (ref 3.87–5.11)
RDW: 14.9 % (ref 11.5–15.5)
WBC: 8.2 10*3/uL (ref 4.0–10.5)
nRBC: 0 % (ref 0.0–0.2)

## 2021-03-09 ENCOUNTER — Ambulatory Visit: Payer: Self-pay | Admitting: *Deleted

## 2021-03-09 NOTE — Telephone Encounter (Signed)
Patient was seen virtually for cough/congestion and treated with antibiotic. Patient states it took her a while to get an antibiotic due to shortage- but she did received Rx for Cefdinir 300 mg around 10/29. Patient states she took it for 3 days and she started to itch- so she stopped the medication.Patient states she has started seeing blood in sputum and nasal discharge. Patient states she saw increase in blood today- which prompted her call. Call to office- no open appointment- advised UC for evaluation.

## 2021-03-09 NOTE — Telephone Encounter (Signed)
Agree with urgent care triage given this particular concern  Erica Bartlett, [name] 03/09/2021, 11:43 AM

## 2021-03-09 NOTE — Telephone Encounter (Signed)
Pt called in stating she had a sinus infection and was giving some antibiotics but it is still not working and she has been trying OTC medications and wants to know what are some good OTC please advise.  Reason for Disposition  [1] Coughed up blood AND [2] > 1 tablespoon (15 ml)  (Exception: Blood-tinged sputum.)  Answer Assessment - Initial Assessment Questions 1. ONSET: "When did the cough begin?"      2 weeks 2. SEVERITY: "How bad is the cough today?"      Coughing frequently- off/on 3. SPUTUM: "Describe the color of your sputum" (none, dry cough; clear, white, yellow, green)     Yellow with blood 4. HEMOPTYSIS: "Are you coughing up any blood?" If so ask: "How much?" (flecks, streaks, tablespoons, etc.)     10/30- yes- worse today- more than streaks 5. DIFFICULTY BREATHING: "Are you having difficulty breathing?" If Yes, ask: "How bad is it?" (e.g., mild, moderate, severe)    - MILD: No SOB at rest, mild SOB with walking, speaks normally in sentences, can lie down, no retractions, pulse < 100.    - MODERATE: SOB at rest, SOB with minimal exertion and prefers to sit, cannot lie down flat, speaks in phrases, mild retractions, audible wheezing, pulse 100-120.    - SEVERE: Very SOB at rest, speaks in single words, struggling to breathe, sitting hunched forward, retractions, pulse > 120      no 6. FEVER: "Do you have a fever?" If Yes, ask: "What is your temperature, how was it measured, and when did it start?"     no 7. CARDIAC HISTORY: "Do you have any history of heart disease?" (e.g., heart attack, congestive heart failure)      no 8. LUNG HISTORY: "Do you have any history of lung disease?"  (e.g., pulmonary embolus, asthma, emphysema)     no 9. PE RISK FACTORS: "Do you have a history of blood clots?" (or: recent major surgery, recent prolonged travel, bedridden)     no 10. OTHER SYMPTOMS: "Do you have any other symptoms?" (e.g., runny nose, wheezing, chest pain)       Chest congestion,  nasal congestion, cough 11. PREGNANCY: "Is there any chance you are pregnant?" "When was your last menstrual period?"       na 12. TRAVEL: "Have you traveled out of the country in the last month?" (e.g., travel history, exposures)       na  Protocols used: Cough - Acute Productive-A-AH

## 2021-03-12 DIAGNOSIS — J019 Acute sinusitis, unspecified: Secondary | ICD-10-CM | POA: Diagnosis not present

## 2021-03-12 DIAGNOSIS — J029 Acute pharyngitis, unspecified: Secondary | ICD-10-CM | POA: Diagnosis not present

## 2021-03-12 DIAGNOSIS — B9689 Other specified bacterial agents as the cause of diseases classified elsewhere: Secondary | ICD-10-CM | POA: Diagnosis not present

## 2021-03-14 ENCOUNTER — Telehealth: Payer: Self-pay | Admitting: Family Medicine

## 2021-03-14 DIAGNOSIS — Z1231 Encounter for screening mammogram for malignant neoplasm of breast: Secondary | ICD-10-CM

## 2021-03-14 NOTE — Telephone Encounter (Signed)
Pt needs order/referral to [facility] so she can schedule her  mammogram.

## 2021-03-15 NOTE — Telephone Encounter (Signed)
Order sent to [facility] for mammogram  She may now call to schedule  [name], [facility] 03/15/2021, 11:51 AM

## 2021-03-16 ENCOUNTER — Ambulatory Visit: Payer: Medicare HMO | Admitting: Family Medicine

## 2021-03-22 ENCOUNTER — Encounter: Payer: Self-pay | Admitting: *Deleted

## 2021-03-22 NOTE — Progress Notes (Signed)
CSW received call from patient requesting information on patient financial assistance program- patient previously worked with [facility]. CSW explained program is now limited to those in treatment and has income guidelines/fund limit.  CSW explained patient is currently not in active treatment and therefore does not qualify for program at this time.  Maryjean Morn, MSW, LCSW, OSW-C Clinical Social Worker [facility] [phone]

## 2021-03-31 ENCOUNTER — Inpatient Hospital Stay: Payer: Medicare HMO | Attending: Oncology

## 2021-04-21 NOTE — Progress Notes (Signed)
°  [facility]  Telephone:(336) [phone] Fax:(336) [phone]  ID: Erica Bartlett OB: [birth year]  MR#: [id number]  WUX#:[id number]  Patient Care Team: Olin Hauser, DO as PCP - General (Family Medicine) Chucky May, MD as Consulting Physician (Psychiatry) Lloyd Huger, MD as Consulting Physician (Oncology) Greg Cutter, LCSW as Social Worker (Licensed Clinical Social Worker)  Patient left without being seen.    Lloyd Huger, MD   04/21/2021 6:04 PM    This encounter was created in error - please disregard.

## 2021-04-27 ENCOUNTER — Other Ambulatory Visit: Payer: Self-pay | Admitting: Family Medicine

## 2021-04-27 DIAGNOSIS — I10 Essential (primary) hypertension: Secondary | ICD-10-CM

## 2021-04-27 NOTE — Telephone Encounter (Signed)
Requested medications are due for refill today.  yes  Requested medications are on the active medications list.  yes  Last refill. 01/12/2021  Future visit scheduled.   no  Notes to clinic.  Patient has not been seen for HTN since 10/28/2019. Pt was to Follow up 04/29/2020. Pt has been see for sinusitis several times, but has cancelled many appointments for check ups.    Requested Prescriptions  Pending Prescriptions Disp Refills   amLODipine (NORVASC) 10 MG tablet [Pharmacy Med Name: AMLODIPINE BESYLATE 10 MG TAB] 90 tablet 1    Sig: TAKE 1 TABLET BY MOUTH EVERY DAY     Cardiovascular:  Calcium Channel Blockers Passed - 04/27/2021  9:00 AM      Passed - Last BP in normal range    BP Readings from Last 1 Encounters:  02/03/21 113/65          Passed - Valid encounter within last 6 months    Recent Outpatient Visits           2 months ago Acute non-recurrent frontal sinusitis   [place], DO   4 months ago Acute non-recurrent maxillary sinusitis   [place], DO   8 months ago Acute non-recurrent frontal sinusitis   [place], DO   1 year ago Acute recurrent frontal sinusitis   [place], DO   1 year ago Acute recurrent frontal sinusitis   [place], Devonne Doughty, [place]

## 2021-04-28 ENCOUNTER — Inpatient Hospital Stay: Payer: Medicare HMO | Attending: Oncology

## 2021-04-28 ENCOUNTER — Inpatient Hospital Stay: Payer: Medicare HMO | Admitting: Oncology

## 2021-04-28 ENCOUNTER — Encounter: Payer: Self-pay | Admitting: Oncology

## 2021-04-28 ENCOUNTER — Telehealth: Payer: Self-pay | Admitting: Oncology

## 2021-04-28 ENCOUNTER — Other Ambulatory Visit: Payer: Self-pay

## 2021-04-28 VITALS — BP 115/72 | HR 84 | Temp 97.8°F | Resp 16 | Wt 175.6 lb

## 2021-04-28 DIAGNOSIS — D693 Immune thrombocytopenic purpura: Secondary | ICD-10-CM | POA: Diagnosis not present

## 2021-04-28 LAB — CBC WITH DIFFERENTIAL/PLATELET
Abs Immature Granulocytes: 0.02 10*3/uL (ref 0.00–0.07)
Basophils Absolute: 0 10*3/uL (ref 0.0–0.1)
Basophils Relative: 1 %
Eosinophils Absolute: 0.1 10*3/uL (ref 0.0–0.5)
Eosinophils Relative: 2 %
HCT: 43 % (ref 36.0–46.0)
Hemoglobin: 14.5 g/dL (ref 12.0–15.0)
Immature Granulocytes: 0 %
Lymphocytes Relative: 24 %
Lymphs Abs: 1.4 10*3/uL (ref 0.7–4.0)
MCH: 28.8 pg (ref 26.0–34.0)
MCHC: 33.7 g/dL (ref 30.0–36.0)
MCV: 85.3 fL (ref 80.0–100.0)
Monocytes Absolute: 0.3 10*3/uL (ref 0.1–1.0)
Monocytes Relative: 5 %
Neutro Abs: 4.1 10*3/uL (ref 1.7–7.7)
Neutrophils Relative %: 68 %
Platelets: 144 10*3/uL — ABNORMAL LOW (ref 150–400)
RBC: 5.04 MIL/uL (ref 3.87–5.11)
RDW: 14.3 % (ref 11.5–15.5)
WBC: 6 10*3/uL (ref 4.0–10.5)
nRBC: 0 % (ref 0.0–0.2)

## 2021-04-28 NOTE — Progress Notes (Signed)
Patient here for follow up she rports feeling extremely fatigued. Denies shortness of breath.

## 2021-04-28 NOTE — Telephone Encounter (Signed)
Pt left without being seen due to a delay from the provider. He had a urgent issue to attend too at the hospital. VM left for patient to contact me for rescheduling in 3 mnths with labs. Mailed $20.00 [employer] gift card to apologize.

## 2021-04-28 NOTE — Progress Notes (Signed)
Error

## 2021-05-06 ENCOUNTER — Encounter: Payer: Self-pay | Admitting: Oncology

## 2021-05-06 NOTE — Telephone Encounter (Signed)
error 

## 2021-05-17 ENCOUNTER — Telehealth: Payer: Self-pay | Admitting: Emergency Medicine

## 2021-05-17 NOTE — Telephone Encounter (Signed)
Medical Clearance form faxed to [facility].

## 2021-06-09 ENCOUNTER — Other Ambulatory Visit: Payer: Self-pay | Admitting: Family Medicine

## 2021-06-09 DIAGNOSIS — K219 Gastro-esophageal reflux disease without esophagitis: Secondary | ICD-10-CM

## 2021-06-09 NOTE — Telephone Encounter (Signed)
Requested Prescriptions  Pending Prescriptions Disp Refills   pantoprazole (PROTONIX) 40 MG tablet [Pharmacy Med Name: PANTOPRAZOLE SOD DR 40 MG TAB] 90 tablet 1    Sig: TAKE 1 TABLET BY MOUTH EVERY DAY BEFORE BREAKFAST     Gastroenterology: Proton Pump Inhibitors Passed - 06/09/2021  8:34 AM      Passed - Valid encounter within last 12 months    Recent Outpatient Visits          3 months ago Acute non-recurrent frontal sinusitis   [facility] [place], Devonne Doughty, DO   6 months ago Acute non-recurrent maxillary sinusitis   [facility] Olin Hauser, DO   9 months ago Acute non-recurrent frontal sinusitis   [place], DO   1 year ago Acute recurrent frontal sinusitis   [place], DO   1 year ago Acute recurrent frontal sinusitis   [facility] [place], Devonne Doughty, DO      Future Appointments            In 1 week [facility], [facility]

## 2021-06-17 ENCOUNTER — Ambulatory Visit (INDEPENDENT_AMBULATORY_CARE_PROVIDER_SITE_OTHER): Payer: Medicare HMO

## 2021-06-17 DIAGNOSIS — Z0000 Encounter for general adult medical examination without abnormal findings: Secondary | ICD-10-CM

## 2021-06-17 DIAGNOSIS — Z1231 Encounter for screening mammogram for malignant neoplasm of breast: Secondary | ICD-10-CM

## 2021-06-17 NOTE — Patient Instructions (Addendum)
Erica Bartlett , Thank you for taking time to come for your Medicare Wellness Visit. I appreciate your ongoing commitment to your health goals. Please review the following plan we discussed and let me know if I can assist you in the future.   Screening recommendations/referrals: Colonoscopy: will do cologuard in next few days Mammogram: referral sent Bone Density: declined Recommended yearly ophthalmology/optometry visit for glaucoma screening and checkup Recommended yearly dental visit for hygiene and checkup  Vaccinations: Influenza vaccine: n/d Pneumococcal vaccine: n/d Tdap vaccine: 07/28/14 Shingles vaccine: n/d  Covid-19: 06/09/19, 07/05/19  Advanced directives: no  Conditions/risks identified: none  Next appointment: Follow up in one year for your annual wellness visit. - 06/23/22 @ 9:40am by phone  Preventive Care 40-64 Years, Female Preventive care refers to lifestyle choices and visits with your health care provider that can promote health and wellness. What does preventive care include? A yearly physical exam. This is also called an annual well check. Dental exams once or twice a year. Routine eye exams. Ask your health care provider how often you should have your eyes checked. Personal lifestyle choices, including: Daily care of your teeth and gums. Regular physical activity. Eating a healthy diet. Avoiding tobacco and drug use. Limiting alcohol use. Practicing safe sex. Taking low-dose aspirin daily starting at age 79. Taking vitamin and mineral supplements as recommended by your health care provider. What happens during an annual well check? The services and screenings done by your health care provider during your annual well check will depend on your age, overall health, lifestyle risk factors, and family history of disease. Counseling  Your health care provider may ask you questions about your: Alcohol use. Tobacco use. Drug use. Emotional well-being. Home and  relationship well-being. Sexual activity. Eating habits. Work and work Statistician. Method of birth control. Menstrual cycle. Pregnancy history. Screening  You may have the following tests or measurements: Height, weight, and BMI. Blood pressure. Lipid and cholesterol levels. These may be checked every 5 years, or more frequently if you are over 89 years old. Skin check. Lung cancer screening. You may have this screening every year starting at age 9 if you have a 30-pack-year history of smoking and currently smoke or have quit within the past 15 years. Fecal occult blood test (FOBT) of the stool. You may have this test every year starting at age 83. Flexible sigmoidoscopy or colonoscopy. You may have a sigmoidoscopy every 5 years or a colonoscopy every 10 years starting at age 73. Hepatitis C blood test. Hepatitis B blood test. Sexually transmitted disease (STD) testing. Diabetes screening. This is done by checking your blood sugar (glucose) after you have not eaten for a while (fasting). You may have this done every 1-3 years. Mammogram. This may be done every 1-2 years. Talk to your health care provider about when you should start having regular mammograms. This may depend on whether you have a family history of breast cancer. BRCA-related cancer screening. This may be done if you have a family history of breast, ovarian, tubal, or peritoneal cancers. Pelvic exam and Pap test. This may be done every 3 years starting at age 66. Starting at age 33, this may be done every 5 years if you have a Pap test in combination with an HPV test. Bone density scan. This is done to screen for osteoporosis. You may have this scan if you are at high risk for osteoporosis. Discuss your test results, treatment options, and if necessary, the need for more tests  with your health care provider. Vaccines  Your health care provider may recommend certain vaccines, such as: Influenza vaccine. This is recommended  every year. Tetanus, diphtheria, and acellular pertussis (Tdap, Td) vaccine. You may need a Td booster every 10 years. Zoster vaccine. You may need this after age 45. Pneumococcal 13-valent conjugate (PCV13) vaccine. You may need this if you have certain conditions and were not previously vaccinated. Pneumococcal polysaccharide (PPSV23) vaccine. You may need one or two doses if you smoke cigarettes or if you have certain conditions. Talk to your health care provider about which screenings and vaccines you need and how often you need them. This information is not intended to replace advice given to you by your health care provider. Make sure you discuss any questions you have with your health care provider. Document Released: 05/07/2015 Document Revised: 12/29/2015 Document Reviewed: 02/09/2015 Elsevier Interactive Patient Education  2017 [place] Prevention in the Home Falls can cause injuries. They can happen to people of all ages. There are many things you can do to make your home safe and to help prevent falls. What can I do on the outside of my home? Regularly fix the edges of walkways and driveways and fix any cracks. Remove anything that might make you trip as you walk through a door, such as a raised step or threshold. Trim any bushes or trees on the path to your home. Use bright outdoor lighting. Clear any walking paths of anything that might make someone trip, such as rocks or tools. Regularly check to see if handrails are loose or broken. Make sure that both sides of any steps have handrails. Any raised decks and porches should have guardrails on the edges. Have any leaves, snow, or ice cleared regularly. Use sand or salt on walking paths during winter. Clean up any spills in your garage right away. This includes oil or grease spills. What can I do in the bathroom? Use night lights. Install grab bars by the toilet and in the tub and shower. Do not use towel bars as  grab bars. Use non-skid mats or decals in the tub or shower. If you need to sit down in the shower, use a plastic, non-slip stool. Keep the floor dry. Clean up any water that spills on the floor as soon as it happens. Remove soap buildup in the tub or shower regularly. Attach bath mats securely with double-sided non-slip rug tape. Do not have throw rugs and other things on the floor that can make you trip. What can I do in the bedroom? Use night lights. Make sure that you have a light by your bed that is easy to reach. Do not use any sheets or blankets that are too big for your bed. They should not hang down onto the floor. Have a firm chair that has side arms. You can use this for support while you get dressed. Do not have throw rugs and other things on the floor that can make you trip. What can I do in the kitchen? Clean up any spills right away. Avoid walking on wet floors. Keep items that you use a lot in easy-to-reach places. If you need to reach something above you, use a strong step stool that has a grab bar. Keep electrical cords out of the way. Do not use floor polish or wax that makes floors slippery. If you must use wax, use non-skid floor wax. Do not have throw rugs and other things on the  floor that can make you trip. What can I do with my stairs? Do not leave any items on the stairs. Make sure that there are handrails on both sides of the stairs and use them. Fix handrails that are broken or loose. Make sure that handrails are as long as the stairways. Check any carpeting to make sure that it is firmly attached to the stairs. Fix any carpet that is loose or worn. Avoid having throw rugs at the top or bottom of the stairs. If you do have throw rugs, attach them to the floor with carpet tape. Make sure that you have a light switch at the top of the stairs and the bottom of the stairs. If you do not have them, ask someone to add them for you. What else can I do to help prevent  falls? Wear shoes that: Do not have high heels. Have rubber bottoms. Are comfortable and fit you well. Are closed at the toe. Do not wear sandals. If you use a stepladder: Make sure that it is fully opened. Do not climb a closed stepladder. Make sure that both sides of the stepladder are locked into place. Ask someone to hold it for you, if possible. Clearly mark and make sure that you can see: Any grab bars or handrails. First and last steps. Where the edge of each step is. Use tools that help you move around (mobility aids) if they are needed. These include: Canes. Walkers. Scooters. Crutches. Turn on the lights when you go into a dark area. Replace any light bulbs as soon as they burn out. Set up your furniture so you have a clear path. Avoid moving your furniture around. If any of your floors are uneven, fix them. If there are any pets around you, be aware of where they are. Review your medicines with your doctor. Some medicines can make you feel dizzy. This can increase your chance of falling. Ask your doctor what other things that you can do to help prevent falls. This information is not intended to replace advice given to you by your health care provider. Make sure you discuss any questions you have with your health care provider. Document Released: 02/04/2009 Document Revised: 09/16/2015 Document Reviewed: 05/15/2014 Elsevier Interactive Patient Education  2017 [employer].

## 2021-06-17 NOTE — Progress Notes (Signed)
Virtual Visit via Telephone Note  I connected with  Erica Bartlett on 06/17/21 at 10:20 AM EST by telephone and verified that I am speaking with the correct person using two identifiers.  Location: Patient: home Provider: [facility] Persons participating in the virtual visit: [place]   I discussed the limitations, risks, security and privacy concerns of performing an evaluation and management service by telephone and the availability of in person appointments. The patient expressed understanding and agreed to proceed.  Interactive audio and video telecommunications were attempted between this nurse and patient, however failed, due to patient having technical difficulties OR patient did not have access to video capability.  We continued and completed visit with audio only.  Some vital signs may be absent or patient reported.   Dionisio David, LPN  Subjective:   Erica Bartlett is a 66 y.o. female who presents for Medicare Annual (Subsequent) preventive examination.  Review of Systems            Objective:    There were no vitals filed for this visit. There is no height or weight on file to calculate BMI.  Advanced Directives 04/28/2021 02/03/2021 01/19/2021 01/06/2021 06/29/2020 08/19/2019 07/23/2019  Does Patient Have a Medical Advance Directive? Yes Yes Yes Yes No Yes No  Type of Paramedic of [place];Living will [place];Living will [place];Living will [place];Living will - [place];Living will -  Does patient want to make changes to medical advance directive? No - Patient declined - No - Patient declined - - - No - Patient declined  Copy of [place] in Chart? No - copy requested - No - copy requested - - No - copy requested -  Would patient like information on creating a medical advance directive? No - Patient declined - - - - - -     Current Medications (verified) Outpatient Encounter Medications as of 06/17/2021  Medication Sig   amLODipine (NORVASC) 10 MG tablet Take 1 tablet by mouth daily.   clonazePAM (KLONOPIN) 1 MG tablet Take by mouth.   diazepam (VALIUM) 2 MG tablet Take by mouth.   nabumetone (RELAFEN) 500 MG tablet Take 1 tablet by mouth 2 (two) times daily.   acetaminophen (TYLENOL) 500 MG tablet Take 500 mg by mouth every 6 (six) hours as needed.   amLODipine (NORVASC) 10 MG tablet Take 1 tablet (10 mg total) by mouth daily. Need office visit for hypertension to get further refills   baclofen (LIORESAL) 10 MG tablet Take 0.5-1 tablets (5-10 mg total) by mouth 3 (three) times daily as needed for muscle spasms.   cefdinir (OMNICEF) 300 MG capsule Take 1 capsule (300 mg total) by mouth 2 (two) times daily.   cetirizine (ZYRTEC) 10 MG tablet Take 1 tablet (10 mg total) by mouth daily.   citalopram (CELEXA) 40 MG tablet Take 40 mg by mouth daily.   clonazePAM (KLONOPIN) 1 MG tablet Take 1-2 mg by mouth 3 (three) times daily as needed. Reported on 10/11/2015   clotrimazole-betamethasone (LOTRISONE) cream Apply 1 application topically 2 (two) times daily.   diphenhydrAMINE (BENADRYL) 25 mg capsule Take 25 mg by mouth as needed.   fluorouracil (EFUDEX) 5 % cream Apply topically 2 (two) times daily.   fluticasone (FLONASE) 50 MCG/ACT nasal spray Place 2 sprays into both nostrils daily.   hydrochlorothiazide (HYDRODIURIL) 25 MG tablet TAKE 1 TABLET BY MOUTH EVERY DAY   levofloxacin (  LEVAQUIN) 500 MG tablet Take 500 mg by mouth daily.   lidocaine (XYLOCAINE) 2 % solution SMARTSIG:By Mouth   mupirocin ointment (BACTROBAN) 2 % Apply 1 application topically daily. Qd to excision site   ondansetron (ZOFRAN) 4 MG tablet Take 1 tablet (4 mg total) by mouth every 8 (eight) hours as needed for nausea or vomiting.   pantoprazole (PROTONIX) 40 MG tablet TAKE 1 TABLET BY MOUTH EVERY DAY BEFORE BREAKFAST   predniSONE  (DELTASONE) 20 MG tablet Take daily with food. Start with 60mg  (3 pills) x 2 days, then reduce to 40mg  (2 pills) x 2 days, then 20mg  (1 pill) x 3 days   VENTOLIN HFA 108 (90 Base) MCG/ACT inhaler INHALE 1-2 PUFFS INTO THE LUNGS EVERY 6 (SIX) HOURS AS NEEDED FOR WHEEZING OR SHORTNESS OF BREATH.   No facility-administered encounter medications on file as of 06/17/2021.    Allergies (verified) Doxycycline and Penicillins   History: Past Medical History:  Diagnosis Date   Hypertension    Past Surgical History:  Procedure Laterality Date   ABDOMINAL HYSTERECTOMY     BREAST BIOPSY Left    age 53 surgical bx neg   CESAREAN SECTION     cyst removed     breast, benign   Family History  Problem Relation Age of Onset   Diabetes Mother    Heart failure Father    Kidney failure Father    Social History   Socioeconomic History   Marital status: Widowed    Spouse name: Not on file   Number of children: Not on file   Years of education: Not on file   Highest education level: Not on file  Occupational History    Comment: part time  Tobacco Use   Smoking status: Some Days    Packs/day: 0.15    Types: Cigarettes   Smokeless tobacco: Never   Tobacco comments:    1 pack takes 3.5-4 weeks to finish   Vaping Use   Vaping Use: Never used  Substance and Sexual Activity   Alcohol use: No   Drug use: No   Sexual activity: Yes    Birth control/protection: Post-menopausal  Other Topics Concern   Not on file  Social History Narrative   Not on file   Social Determinants of Health   Financial Resource Strain: Not on file  Food Insecurity: Not on file  Transportation Needs: Not on file  Physical Activity: Not on file  Stress: Not on file  Social Connections: Not on file    Tobacco Counseling Ready to quit: Not Answered Counseling given: Not Answered Tobacco comments: 1 pack takes 3.5-4 weeks to finish    Clinical Intake:  Pre-visit preparation completed: Yes  Pain :  No/denies pain     Diabetes: No  How often do you need to have someone help you when you read instructions, pamphlets, or other written materials from your doctor or pharmacy?: 1 - Never  Diabetic?no  Interpreter Needed?: No  Information entered by :: Kirke Shaggy, LPN   Activities of Daily Living No flowsheet data found.  Patient Care Team: Olin Hauser, DO as PCP - General (Family Medicine) Chucky May, MD as Consulting Physician (Psychiatry) Lloyd Huger, MD as Consulting Physician (Oncology) Greg Cutter, LCSW as Social Worker (Licensed Clinical Social Worker)  Indicate any recent [employer] you may have received from other than [facility] providers in the past year (date may be approximate).     Assessment:   This is  a routine wellness examination for Erica Bartlett.  Hearing/Vision screen No results found.  Dietary issues and exercise activities discussed:     Goals Addressed   None    Depression Screen PHQ 2/9 Scores 08/19/2020 06/17/2019 03/28/2019 12/17/2018 11/12/2018 10/15/2018 08/26/2018  PHQ - 2 Score 0 0 0 1 - 2 2  PHQ- 9 Score - - - - - 5 11  Exception Documentation - - - - Patient refusal - -    Fall Risk Fall Risk  08/19/2020 07/16/2019 06/17/2019 03/28/2019 12/17/2018  Falls in the past year? 0 0 1 0 1  Number falls in past yr: 0 - 0 0 0  Injury with Fall? 0 - 1 - 1  Follow up Falls evaluation completed - - Falls evaluation completed Falls prevention discussed;Education provided    FALL RISK PREVENTION PERTAINING TO THE HOME:  Any stairs in or around the home? Yes  If so, are there any without handrails? No  Home free of loose throw rugs in walkways, pet beds, electrical cords, etc? Yes  Adequate lighting in your home to reduce risk of falls? Yes   ASSISTIVE DEVICES UTILIZED TO PREVENT FALLS:  Life alert? No  Use of a cane, walker or w/c? No  Grab bars in the bathroom? Yes  Shower chair or bench in shower? No  Elevated toilet  seat or a handicapped toilet? No    Cognitive Function:    6CIT Screen 09/18/2017  What Year? 0 points  What month? 0 points  What time? 0 points  Count back from 20 0 points  Months in reverse 0 points  Repeat phrase 0 points  Total Score 0    Immunizations Immunization History  Administered Date(s) Administered   Influenza,inj,Quad PF,6+ Mos 02/18/2015, 01/31/2019   Moderna Sars-Covid-2 Vaccination 06/09/2019, 07/05/2019    TDAP status: Up to date  Flu Vaccine status: Declined, Education has been provided regarding the importance of this vaccine but patient still declined. Advised may receive this vaccine at local pharmacy or Health Dept. Aware to provide a copy of the vaccination record if obtained from local pharmacy or Health Dept. Verbalized acceptance and understanding.  Pneumococcal vaccine status: Declined,  Education has been provided regarding the importance of this vaccine but patient still declined. Advised may receive this vaccine at local pharmacy or Health Dept. Aware to provide a copy of the vaccination record if obtained from local pharmacy or Health Dept. Verbalized acceptance and understanding.   Covid-19 vaccine status: Completed vaccines  Qualifies for Shingles Vaccine? Yes   Zostavax completed No   Shingrix Completed?: No.    Education has been provided regarding the importance of this vaccine. Patient has been advised to call insurance company to determine out of pocket expense if they have not yet received this vaccine. Advised may also receive vaccine at local pharmacy or Health Dept. Verbalized acceptance and understanding.  Screening Tests Health Maintenance  Topic Date Due   Hepatitis C Screening  Never done   Zoster Vaccines- Shingrix (1 of 2) Never done   PAP SMEAR-Modifier  Never done   COLONOSCOPY (Pts 45-26yrs Insurance coverage will need to be confirmed)  Never done   MAMMOGRAM  09/13/2018   COVID-19 Vaccine (3 - Moderna risk series)  08/02/2019   INFLUENZA VACCINE  11/22/2020   TETANUS/TDAP  07/27/2024   HIV Screening  Completed   HPV VACCINES  Aged Out    Health Maintenance  Health Maintenance Due  Topic Date Due   Hepatitis C Screening  Never done   Zoster Vaccines- Shingrix (1 of 2) Never done   PAP SMEAR-Modifier  Never done   COLONOSCOPY (Pts 45-71yrs Insurance coverage will need to be confirmed)  Never done   MAMMOGRAM  09/13/2018   COVID-19 Vaccine (3 - Moderna risk series) 08/02/2019   INFLUENZA VACCINE  11/22/2020    Colorectal cancer screening: Type of screening: Cologuard. Completed has at home, will complete. Repeat every 3 years  Mammogram status: Ordered referral. Pt provided with contact info and advised to call to schedule appt.    Lung Cancer Screening: (Low Dose CT Chest recommended if Age 47-80 years, 30 pack-year currently smoking OR have quit w/in 15years.) does not qualify.    Additional Screening:  Hepatitis C Screening: does qualify; Completed no  Vision Screening: Recommended annual ophthalmology exams for early detection of glaucoma and other disorders of the eye. Is the patient up to date with their annual eye exam?  Yes  Who is the provider or what is the name of the office in which the patient attends annual eye exams? Dr.Bell If pt is not established with a provider, would they like to be referred to a provider to establish care? No .   Dental Screening: Recommended annual dental exams for proper oral hygiene  Community Resource Referral / Chronic Care Management: CRR required this visit?  No   CCM required this visit?  No      Plan:     I have personally reviewed and noted the following in the patients chart:   Medical and social history Use of alcohol, tobacco or illicit drugs  Current medications and supplements including opioid prescriptions.  Functional ability and status Nutritional status Physical activity Advanced directives List of other  physicians Hospitalizations, surgeries, and ER visits in previous 12 months Vitals Screenings to include cognitive, depression, and falls Referrals and appointments  In addition, I have reviewed and discussed with patient certain preventive protocols, quality metrics, and best practice recommendations. A written personalized care plan for preventive services as well as general preventive health recommendations were provided to patient.     Dionisio David, LPN   [id number]/[id number]   Nurse Notes: none

## 2021-06-20 ENCOUNTER — Ambulatory Visit (INDEPENDENT_AMBULATORY_CARE_PROVIDER_SITE_OTHER): Payer: Medicare HMO | Admitting: Family Medicine

## 2021-06-20 ENCOUNTER — Encounter: Payer: Self-pay | Admitting: Family Medicine

## 2021-06-20 DIAGNOSIS — R112 Nausea with vomiting, unspecified: Secondary | ICD-10-CM | POA: Diagnosis not present

## 2021-06-20 DIAGNOSIS — J0100 Acute maxillary sinusitis, unspecified: Secondary | ICD-10-CM

## 2021-06-20 MED ORDER — BENZONATATE 100 MG PO CAPS
100.0000 mg | ORAL_CAPSULE | Freq: Three times a day (TID) | ORAL | 0 refills | Status: DC | PRN
Start: 2021-06-20 — End: 2021-11-04

## 2021-06-20 MED ORDER — LEVOFLOXACIN 500 MG PO TABS: 500.0000 mg | ORAL_TABLET | Freq: Every day | ORAL | 0 refills | Status: DC

## 2021-06-20 MED ORDER — PREDNISONE 20 MG PO TABS: ORAL_TABLET | ORAL | 0 refills | Status: DC

## 2021-06-20 NOTE — Progress Notes (Signed)
Virtual Visit via Telephone The purpose of this virtual visit is to provide medical care while limiting exposure to the novel coronavirus (COVID19) for both patient and office staff.  Consent was obtained for phone visit:  Yes.   Answered questions that patient had about telehealth interaction:  Yes.   I discussed the limitations, risks, security and privacy concerns of performing an evaluation and management service by telephone. I also discussed with the patient that there may be a patient responsible charge related to this service. The patient expressed understanding and agreed to proceed.  Patient Location: Home Provider Location: [facility] (Office)  Participants in virtual visit: - Patient: Erica Bartlett - CMA: Orinda Kenner, CMA - Provider: Dr Parks Ranger  ---------------------------------------------------------------------- Chief Complaint  Patient presents with   Sinusitis    S: Reviewed CMA documentation. I have called patient and gathered additional HPI as follows:  Sinusitis Reports that symptoms started 3 days ago gradual worsening with sinus pressure drainage, cough, post nasal drainage, nausea vomiting episode, she had some temp fluctuation question hot flash. - Tried OTC Tylenol and Robitussin  She has immune suppression. Previous sinusitis 02/2021. Resolved w/ therapy.  Has Zofran left over on refill  Admits sinus pain or pressure, headache  Denies any fevers, chills, sweats, body ache, cough, shortness of breath, abdominal pain, diarrhea  Past Medical History:  Diagnosis Date   Hypertension    Social History   Tobacco Use   Smoking status: Some Days    Packs/day: 0.15    Types: Cigarettes   Smokeless tobacco: Never   Tobacco comments:    1 pack takes 3.5-4 weeks to finish   Vaping Use   Vaping Use: Never used  Substance Use Topics   Alcohol use: No   Drug use: No    Current Outpatient Medications:    acetaminophen  (TYLENOL) 500 MG tablet, Take 500 mg by mouth every 6 (six) hours as needed., Disp: , Rfl:    amLODipine (NORVASC) 10 MG tablet, Take 1 tablet (10 mg total) by mouth daily. Need office visit for hypertension to get further refills, Disp: 90 tablet, Rfl: 0   benzonatate (TESSALON) 100 MG capsule, Take 1 capsule (100 mg total) by mouth 3 (three) times daily as needed for cough., Disp: 30 capsule, Rfl: 0   cetirizine (ZYRTEC) 10 MG tablet, Take 1 tablet (10 mg total) by mouth daily., Disp: 30 tablet, Rfl: 11   citalopram (CELEXA) 40 MG tablet, Take 40 mg by mouth daily., Disp: , Rfl:    clonazePAM (KLONOPIN) 1 MG tablet, Take 1-2 mg by mouth 3 (three) times daily as needed. Reported on 10/11/2015, Disp: , Rfl:    diphenhydrAMINE (BENADRYL) 25 mg capsule, Take 25 mg by mouth as needed., Disp: , Rfl:    fluorouracil (EFUDEX) 5 % cream, Apply topically 2 (two) times daily., Disp: 40 g, Rfl: 2   fluticasone (FLONASE) 50 MCG/ACT nasal spray, Place 2 sprays into both nostrils daily., Disp: 16 g, Rfl: 11   hydrochlorothiazide (HYDRODIURIL) 25 MG tablet, TAKE 1 TABLET BY MOUTH EVERY DAY, Disp: 90 tablet, Rfl: 3   levofloxacin (LEVAQUIN) 500 MG tablet, Take 1 tablet (500 mg total) by mouth daily. For 7 days, Disp: 7 tablet, Rfl: 0   lidocaine (XYLOCAINE) 2 % solution, SMARTSIG:By Mouth, Disp: , Rfl:    mupirocin ointment (BACTROBAN) 2 %, Apply 1 application topically daily. Qd to excision site, Disp: 22 g, Rfl: 1   ondansetron (ZOFRAN) 4 MG tablet,  Take 1 tablet (4 mg total) by mouth every 8 (eight) hours as needed for nausea or vomiting., Disp: 60 tablet, Rfl: 2   pantoprazole (PROTONIX) 40 MG tablet, TAKE 1 TABLET BY MOUTH EVERY DAY BEFORE BREAKFAST, Disp: 90 tablet, Rfl: 1   predniSONE (DELTASONE) 20 MG tablet, Take daily with food. Start with 60mg  (3 pills) x 2 days, then reduce to 40mg  (2 pills) x 2 days, then 20mg  (1 pill) x 3 days, Disp: 13 tablet, Rfl: 0   VENTOLIN HFA 108 (90 Base) MCG/ACT inhaler,  INHALE 1-2 PUFFS INTO THE LUNGS EVERY 6 (SIX) HOURS AS NEEDED FOR WHEEZING OR SHORTNESS OF BREATH., Disp: 18 g, Rfl: 0   baclofen (LIORESAL) 10 MG tablet, Take 0.5-1 tablets (5-10 mg total) by mouth 3 (three) times daily as needed for muscle spasms. (Patient not taking: Reported on 06/17/2021), Disp: 30 each, Rfl: 1   clotrimazole-betamethasone (LOTRISONE) cream, Apply 1 application topically 2 (two) times daily. (Patient not taking: Reported on 06/17/2021), Disp: 45 g, Rfl: 2   diazepam (VALIUM) 2 MG tablet, Take by mouth. (Patient not taking: Reported on 06/17/2021), Disp: , Rfl:    nabumetone (RELAFEN) 500 MG tablet, Take 1 tablet by mouth 2 (two) times daily. (Patient not taking: Reported on 06/17/2021), Disp: , Rfl:   Depression screen [facility] 2/9 06/17/2021 08/19/2020 06/17/2019  Decreased Interest 0 0 0  Down, Depressed, Hopeless 1 0 0  PHQ - 2 Score 1 0 0  Altered sleeping - - -  Tired, decreased energy - - -  Change in appetite - - -  Feeling bad or failure about yourself  - - -  Trouble concentrating - - -  Moving slowly or fidgety/restless - - -  Suicidal thoughts - - -  PHQ-9 Score - - -  Difficult doing work/chores - Not difficult at all -  Some recent data might be hidden    GAD 7 : Generalized Anxiety Score 10/15/2018 08/26/2018  Nervous, Anxious, on Edge 1 1  Control/stop worrying 1 1  Worry too much - different things 1 1  Trouble relaxing 2 0  Restless 0 0  Easily annoyed or irritable 0 1  Afraid - awful might happen 1 1  Total GAD 7 Score 6 5  Anxiety Difficulty Not difficult at all Not difficult at all    -------------------------------------------------------------------------- O: No physical exam performed due to remote telephone encounter.  Lab results reviewed.  Recent Results (from the past 2160 hour(s))  CBC with Differential/Platelet     Status: Abnormal   Collection Time: 04/28/21  1:21 PM  Result Value Ref Range   WBC 6.0 4.0 - 10.5 K/uL   RBC 5.04 3.87 -  5.11 MIL/uL   Hemoglobin 14.5 12.0 - 15.0 g/dL   HCT 43.0 36.0 - 46.0 %   MCV 85.3 80.0 - 100.0 fL   MCH 28.8 26.0 - 34.0 pg   MCHC 33.7 30.0 - 36.0 g/dL   RDW 14.3 11.5 - 15.5 %   Platelets 144 (L) 150 - 400 K/uL   nRBC 0.0 0.0 - 0.2 %   Neutrophils Relative % 68 %   Neutro Abs 4.1 1.7 - 7.7 K/uL   Lymphocytes Relative 24 %   Lymphs Abs 1.4 0.7 - 4.0 K/uL   Monocytes Relative 5 %   Monocytes Absolute 0.3 0.1 - 1.0 K/uL   Eosinophils Relative 2 %   Eosinophils Absolute 0.1 0.0 - 0.5 K/uL   Basophils Relative 1 %   Basophils  Absolute 0.0 0.0 - 0.1 K/uL   Immature Granulocytes 0 %   Abs Immature Granulocytes 0.02 0.00 - 0.07 K/uL    Comment: Performed at [facility], [address] [place], [place] [address]    -------------------------------------------------------------------------- A&P:  Problem List Items Addressed This Visit   None Visit Diagnoses     Acute non-recurrent maxillary sinusitis    -  Primary   Relevant Medications   predniSONE (DELTASONE) 20 MG tablet   levofloxacin (LEVAQUIN) 500 MG tablet   benzonatate (TESSALON) 100 MG capsule   Intractable vomiting with nausea          Consistent with acute frontal rhinosinusitis, likely initially viral URI vs allergic rhinitis component with worsening concern for bacterial infection.  She has chronic immune suppression Has required antibiotic steroid course with infection in past, last 02/2021  Plan: 1. Start taking Levaquin antibiotic 500mg  daily x 7 days 2. Steroid taper 3. Start Tessalon Perls take 1 capsule up to 3 times a day as needed for cough 4. Existing Zofran PRN Return criteria reviewed   Meds ordered this encounter  Medications   predniSONE (DELTASONE) 20 MG tablet    Sig: Take daily with food. Start with 60mg  (3 pills) x 2 days, then reduce to 40mg  (2 pills) x 2 days, then 20mg  (1 pill) x 3 days    Dispense:  13 tablet    Refill:  0   levofloxacin (LEVAQUIN) 500 MG tablet    Sig:  Take 1 tablet (500 mg total) by mouth daily. For 7 days    Dispense:  7 tablet    Refill:  0   benzonatate (TESSALON) 100 MG capsule    Sig: Take 1 capsule (100 mg total) by mouth 3 (three) times daily as needed for cough.    Dispense:  30 capsule    Refill:  0    Follow-up: PRN  Patient verbalizes understanding with the above medical recommendations including the limitation of remote medical advice.  Specific follow-up and call-back criteria were given for patient to follow-up or seek medical care more urgently if needed.   - Time spent in direct consultation with patient on phone: 10 minutes   [name], [facility] 06/20/2021, 11:54 AM

## 2021-06-20 NOTE — Patient Instructions (Signed)
° °  Please schedule a Follow-up Appointment to: No follow-ups on file. ° °If you have any other questions or concerns, please feel free to call the office or send a message through MyChart. You may also schedule an earlier appointment if necessary. ° °Additionally, you may be receiving a survey about your experience at our office within a few days to 1 week by e-mail or mail. We value your feedback. ° °Jera Headings, DO °[facility], [facility] °

## 2021-06-24 ENCOUNTER — Other Ambulatory Visit: Payer: Self-pay

## 2021-06-24 ENCOUNTER — Ambulatory Visit: Payer: Medicare HMO | Admitting: Podiatry

## 2021-06-24 ENCOUNTER — Encounter: Payer: Self-pay | Admitting: Podiatry

## 2021-06-24 DIAGNOSIS — B070 Plantar wart: Secondary | ICD-10-CM

## 2021-06-24 NOTE — Progress Notes (Signed)
? ?  Subjective: ?66 y.o. 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  She has tried multiple topical treatments with no improvement.  Last year we had performed some routine debridements with applications of Cantharone and there was some slight improvement.  She presents today for further treatment and evaluation ? ?  ?Past Medical History:  ?Diagnosis Date  ? Hypertension   ? ? ?Objective: ?Physical Exam ?General: The patient is alert and oriented x3 in no acute distress. ?  ?Dermatology: Multiple hyperkeratotic skin lesions noted to the plantar aspect of the bilateral feet approximately 1 cm in diameter. Pinpoint bleeding noted upon debridement. Skin is warm, dry and supple bilateral lower extremities. Negative for open lesions or macerations. ?  ?Vascular: Palpable pedal pulses bilaterally. No edema or erythema noted. Capillary refill within normal limits. ?  ?Neurological: Epicritic and protective threshold grossly intact bilaterally.  ?  ?Musculoskeletal Exam:  No pedal deformities noted ?  ?Assessment: ?1. plantar wart bilateral feet ? ?  ?Plan of Care:  ?1. Patient was evaluated. ?2. Excisional debridement of the plantar wart lesion(s) was performed using a chisel blade. Cantharone was applied and the lesion(s) was dressed with a dry sterile dressing. ?3.  In the past there was a prescription for Efudex topical solution but this was declined by insurance  ?4.  Patient is to return to clinic in 2 weeks ? ?Edrick Kins, DPM ?[place] ? ?Dr. Edrick Kins, DPM  ?  ?[address]                                       ?[place], [place] [address]                ?Office [phone]  ?Fax [phone] ? ? ? ? ?

## 2021-07-12 ENCOUNTER — Ambulatory Visit: Payer: Medicare HMO | Admitting: Podiatry

## 2021-07-12 ENCOUNTER — Other Ambulatory Visit: Payer: Self-pay

## 2021-07-12 DIAGNOSIS — B070 Plantar wart: Secondary | ICD-10-CM

## 2021-07-12 NOTE — Progress Notes (Signed)
? ?  Subjective: ?66 y.o. 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  Last visit Cantharone applied.  She continues to have pain associated to the foot.  No new complaints at this time ? ?  ?Past Medical History:  ?Diagnosis Date  ? Hypertension   ? ?Past Surgical History:  ?Procedure Laterality Date  ? ABDOMINAL HYSTERECTOMY    ? BREAST BIOPSY Left   ? age 60 surgical bx neg  ? CESAREAN SECTION    ? cyst removed    ? breast, benign  ? ?Allergies  ?Allergen Reactions  ? Doxycycline Nausea And Vomiting  ? Penicillins Hives  ? ? ?Objective: ?Physical Exam ?General: The patient is alert and oriented x3 in no acute distress. ?  ?Dermatology: Multiple hyperkeratotic skin lesions noted to the plantar aspect of the bilateral feet approximately 1 cm in diameter. Pinpoint bleeding noted upon debridement. Skin is warm, dry and supple bilateral lower extremities. Negative for open lesions or macerations. ?  ?Vascular: Palpable pedal pulses bilaterally. No edema or erythema noted. Capillary refill within normal limits. ?  ?Neurological: Epicritic and protective threshold grossly intact bilaterally.  ?  ?Musculoskeletal Exam:  No pedal deformities noted ?  ?Assessment: ?1. plantar wart bilateral feet ? ?  ?Plan of Care:  ?1. Patient was evaluated. ?2. Excisional debridement of the plantar wart lesion(s) was performed using a chisel blade without incident or bleeding ?3.  Recommend salicylic acid daily under occlusion with a Band-Aid ?4.  Advised against going barefoot  ?5.  Return to clinic 4 weeks ? ?Edrick Kins, DPM ?[place] ? ?Dr. Edrick Kins, DPM  ?  ?[address]                                       ?[place], [place] [address]                ?Office [phone]  ?Fax [phone] ? ? ? ? ?

## 2021-07-30 ENCOUNTER — Other Ambulatory Visit: Payer: Self-pay | Admitting: Family Medicine

## 2021-07-30 DIAGNOSIS — I10 Essential (primary) hypertension: Secondary | ICD-10-CM

## 2021-08-01 NOTE — Telephone Encounter (Signed)
Requested medications are due for refill today.  yes ? ?Requested medications are on the active medications list.  yes ? ?Last refill. 04/28/2021 #90 0 refill ? ?Future visit scheduled.   no ? ?Notes to clinic.  Pt has been seen for sinus issues many times. Last time seen for Annual /CPE 03/08/2020. ? ? ? ?Requested Prescriptions  ?Pending Prescriptions Disp Refills  ? amLODipine (NORVASC) 10 MG tablet [Pharmacy Med Name: AMLODIPINE BESYLATE 10 MG TAB] 90 tablet 0  ?  Sig: Take 1 tablet (10 mg total) by mouth daily. Need office visit for hypertension to get further refills  ?  ? Cardiovascular: Calcium Channel Blockers 2 Passed - 07/30/2021  8:32 AM  ?  ?  Passed - Last BP in normal range  ?  BP Readings from Last 1 Encounters:  ?04/28/21 115/72  ?  ?  ?  ?  Passed - Last Heart Rate in normal range  ?  Pulse Readings from Last 1 Encounters:  ?04/28/21 84  ?  ?  ?  ?  Passed - Valid encounter within last 6 months  ?  Recent Outpatient Visits   ? ?      ? 1 month ago Acute non-recurrent maxillary sinusitis  ? [place], DO  ? 5 months ago Acute non-recurrent frontal sinusitis  ? [place], DO  ? 8 months ago Acute non-recurrent maxillary sinusitis  ? [place], DO  ? 11 months ago Acute non-recurrent frontal sinusitis  ? [place], DO  ? 1 year ago Acute recurrent frontal sinusitis  ? [place], DO  ? ?  ?  ? ?  ?  ?  ?  ?

## 2021-08-10 ENCOUNTER — Ambulatory Visit (INDEPENDENT_AMBULATORY_CARE_PROVIDER_SITE_OTHER): Payer: Medicare HMO | Admitting: Family Medicine

## 2021-08-10 ENCOUNTER — Encounter: Payer: Self-pay | Admitting: Family Medicine

## 2021-08-10 VITALS — BP 126/68 | HR 75 | Ht 64.0 in | Wt 178.2 lb

## 2021-08-10 DIAGNOSIS — R253 Fasciculation: Secondary | ICD-10-CM | POA: Diagnosis not present

## 2021-08-10 DIAGNOSIS — H1031 Unspecified acute conjunctivitis, right eye: Secondary | ICD-10-CM | POA: Diagnosis not present

## 2021-08-10 MED ORDER — POLYMYXIN B-TRIMETHOPRIM 10000-0.1 UNIT/ML-% OP SOLN: 1.0000 [drp] | Freq: Four times a day (QID) | OPHTHALMIC | 0 refills | Status: DC

## 2021-08-10 MED ORDER — KETOTIFEN FUMARATE 0.025 % OP SOLN: 1.0000 [drp] | Freq: Two times a day (BID) | OPHTHALMIC | 0 refills | Status: DC

## 2021-08-10 NOTE — Patient Instructions (Addendum)
   Please schedule a Follow-up Appointment to: Return if symptoms worsen or fail to improve.  If you have any other questions or concerns, please feel free to call the office or send a message through MyChart. You may also schedule an earlier appointment if necessary.  Additionally, you may be receiving a survey about your experience at our office within a few days to 1 week by e-mail or mail. We value your feedback.  Mikyla Schachter, DO [facility], [facility] 

## 2021-08-10 NOTE — Progress Notes (Signed)
? ?Subjective:  ? ? Patient ID: Erica Bartlett, female    DOB: [birth year], 66 y.o.   MRN: [id number] ? ?Erica Bartlett is a 66 y.o. female presenting on 08/10/2021 for Eye Problem ? ? ?HPI ? ?R Eye Drainage ?Right Eyelid, upper twitching ?Reported onset 1 week ago with R eye woke up with some crusty discharge worse, and has had episodic throughout the day with R eye twitching or jumping just upper eyelid, does not impact her vision. No vision loss. But occasional blurry at times in morning. Not impacting Left eye. ?Admits occasional drainage with crusty discharge around eye in morning. ?In past has seen Dr [facility] and has been on eye drops before. ?Not having double vision ? ? ? ? ?  08/10/2021  ?  2:43 PM 06/17/2021  ? 10:26 AM 08/19/2020  ? 11:04 AM  ?Depression screen PHQ 2/9  ?Decreased Interest 0 0 0  ?Down, Depressed, Hopeless 0 1 0  ?PHQ - 2 Score 0 1 0  ?Altered sleeping 3    ?Tired, decreased energy 3    ?Change in appetite 3    ?Feeling bad or failure about yourself  0    ?Trouble concentrating 0    ?Moving slowly or fidgety/restless 0    ?Suicidal thoughts 0    ?PHQ-9 Score 9    ?Difficult doing work/chores Somewhat difficult  Not difficult at all  ? ? ?Social History  ? ?Tobacco Use  ? Smoking status: Some Days  ?  Packs/day: 0.15  ?  Types: Cigarettes  ? Smokeless tobacco: Never  ? Tobacco comments:  ?  1 pack takes 3.5-4 weeks to finish   ?Vaping Use  ? Vaping Use: Never used  ?Substance Use Topics  ? Alcohol use: No  ? Drug use: No  ? ? ?Review of Systems ?Per HPI unless specifically indicated above ? ?   ?Objective:  ?  ?BP 126/68   Pulse 75   Ht '5\' 4"'$  (1.626 m)   Wt 178 lb 3.2 oz (80.8 kg)   SpO2 98%   BMI 30.59 kg/m?   ?Wt Readings from Last 3 Encounters:  ?08/10/21 178 lb 3.2 oz (80.8 kg)  ?04/28/21 175 lb 9.6 oz (79.7 kg)  ?02/15/21 167 lb (75.8 kg)  ?  ?Physical Exam ?Vitals and nursing note reviewed.  ?Constitutional:   ?   General: She is not in acute distress. ?    Appearance: Normal appearance. She is well-developed. She is not diaphoretic.  ?   Comments: Well-appearing, comfortable, cooperative  ?HENT:  ?   Head: Normocephalic and atraumatic.  ?Eyes:  ?   General:     ?   Right eye: No discharge.     ?   Left eye: No discharge.  ?   Extraocular Movements: Extraocular movements intact.  ?   Conjunctiva/sclera: Conjunctivae normal.  ?   Pupils: Pupils are equal, round, and reactive to light.  ?   Comments: She has contacts in place. No other abnormality of the eyes. No twitching observed. No stye or swelling.  ?Cardiovascular:  ?   Rate and Rhythm: Normal rate.  ?Pulmonary:  ?   Effort: Pulmonary effort is normal.  ?Skin: ?   General: Skin is warm and dry.  ?   Findings: No erythema or rash.  ?Neurological:  ?   Mental Status: She is alert and oriented to person, place, and time.  ?Psychiatric:     ?   Mood and Affect:  Mood normal.     ?   Behavior: Behavior normal.     ?   Thought Content: Thought content normal.  ?   Comments: Well groomed, good eye contact, normal speech and thoughts  ? ?Results for orders placed or performed in visit on 04/28/21  ?CBC with Differential/Platelet  ?Result Value Ref Range  ? WBC 6.0 4.0 - 10.5 K/uL  ? RBC 5.04 3.87 - 5.11 MIL/uL  ? Hemoglobin 14.5 12.0 - 15.0 g/dL  ? HCT 43.0 36.0 - 46.0 %  ? MCV 85.3 80.0 - 100.0 fL  ? MCH 28.8 26.0 - 34.0 pg  ? MCHC 33.7 30.0 - 36.0 g/dL  ? RDW 14.3 11.5 - 15.5 %  ? Platelets 144 (L) 150 - 400 K/uL  ? nRBC 0.0 0.0 - 0.2 %  ? Neutrophils Relative % 68 %  ? Neutro Abs 4.1 1.7 - 7.7 K/uL  ? Lymphocytes Relative 24 %  ? Lymphs Abs 1.4 0.7 - 4.0 K/uL  ? Monocytes Relative 5 %  ? Monocytes Absolute 0.3 0.1 - 1.0 K/uL  ? Eosinophils Relative 2 %  ? Eosinophils Absolute 0.1 0.0 - 0.5 K/uL  ? Basophils Relative 1 %  ? Basophils Absolute 0.0 0.0 - 0.1 K/uL  ? Immature Granulocytes 0 %  ? Abs Immature Granulocytes 0.02 0.00 - 0.07 K/uL  ? ?   ?Assessment & Plan:  ? ?Problem List Items Addressed This Visit    ?None ?Visit Diagnoses   ? ? Eyelid twitch    -  Primary  ? Relevant Medications  ? ketotifen (ZADITOR) 0.025 % ophthalmic solution  ? Acute conjunctivitis of right eye, unspecified acute conjunctivitis type      ? Relevant Medications  ? ketotifen (ZADITOR) 0.025 % ophthalmic solution  ? trimethoprim-polymyxin b (POLYTRIM) ophthalmic solution  ? ?  ? ?Suspected symptoms described most consistent with allergic conjunctivitis. ?Start generic Zaditor drops ?Can add rx Polytrim course for possible infectious conjunctivitis if not improved ? ? ?Meds ordered this encounter  ?Medications  ? ketotifen (ZADITOR) 0.025 % ophthalmic solution  ?  Sig: Place 1 drop into both eyes 2 (two) times daily.  ?  Dispense:  10 mL  ?  Refill:  0  ? trimethoprim-polymyxin b (POLYTRIM) ophthalmic solution  ?  Sig: Place 1 drop into the right eye every 6 (six) hours. Max 4 drops per 24 hours. For 7-10 days total  ?  Dispense:  10 mL  ?  Refill:  0  ? ? ? ? ?Follow up plan: ?Return if symptoms worsen or fail to improve. ? ? ?[name], DO ?[facility] ?[facility] ?08/10/2021, 2:45 PM ?

## 2021-08-12 ENCOUNTER — Ambulatory Visit: Payer: Medicare HMO | Admitting: Podiatry

## 2021-08-12 ENCOUNTER — Other Ambulatory Visit: Payer: Self-pay | Admitting: Podiatry

## 2021-08-12 DIAGNOSIS — B070 Plantar wart: Secondary | ICD-10-CM

## 2021-08-12 NOTE — Progress Notes (Signed)
? ?  Subjective: ?66 y.o. 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  She has tried multiple topical treatments with no improvement.  Last year we had performed some routine debridements with applications of Cantharone and there was some slight improvement.  She presents today for further treatment and evaluation ? ?  ?Past Medical History:  ?Diagnosis Date  ? Hypertension   ? ? ?Objective: ?Physical Exam ?General: The patient is alert and oriented x3 in no acute distress. ?  ?Dermatology: Multiple hyperkeratotic skin lesions noted to the plantar aspect of the bilateral feet approximately 1 cm in diameter. Pinpoint bleeding noted upon debridement. Skin is warm, dry and supple bilateral lower extremities. Negative for open lesions or macerations. ?  ?Vascular: Palpable pedal pulses bilaterally. No edema or erythema noted. Capillary refill within normal limits. ?  ?Neurological: Epicritic and protective threshold grossly intact bilaterally.  ?  ?Musculoskeletal Exam:  No pedal deformities noted ?  ?Assessment: ?1. plantar wart bilateral feet ? ?  ?Plan of Care:  ?1. Patient was evaluated. ?2. Excisional debridement of the plantar wart lesion(s) was performed using a chisel blade. Cantharone was applied and the lesion(s) was dressed with a dry sterile dressing. ?3.  In the past there was a prescription for Efudex topical solution but this was declined by insurance  ?4.  Patient is to return to clinic in 2 weeks ? ?*Patient states that her sons have PROM in about 2 weeks. ? ?Edrick Kins, DPM ?[place] ? ?Dr. Edrick Kins, DPM  ?  ?[address]                                       ?[place], [place] [address]                ?Office [phone]  ?Fax [phone] ? ? ? ? ?

## 2021-08-26 ENCOUNTER — Encounter: Payer: Self-pay | Admitting: Podiatry

## 2021-08-26 ENCOUNTER — Ambulatory Visit: Payer: Medicare HMO | Admitting: Podiatry

## 2021-08-26 DIAGNOSIS — B070 Plantar wart: Secondary | ICD-10-CM | POA: Diagnosis not present

## 2021-08-26 NOTE — Progress Notes (Signed)
? ?  Subjective: ?66 y.o. 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  Last visit Cantharone was applied.  She presents for further treatment and evaluation ?  ?Past Medical History:  ?Diagnosis Date  ? Hypertension   ? ? ?Objective: ?Physical Exam ?General: The patient is alert and oriented x3 in no acute distress. ?  ?Dermatology: There continues to be multiple symptomatic hyperkeratotic skin lesions noted to the plantar aspect of the bilateral feet approximately 1 cm in diameter. Pinpoint bleeding noted upon debridement. Skin is warm, dry and supple bilateral lower extremities. Negative for open lesions or macerations. ?  ?Vascular: Palpable pedal pulses bilaterally. No edema or erythema noted. Capillary refill within normal limits. ?  ?Neurological: Epicritic and protective threshold grossly intact bilaterally.  ?  ?Musculoskeletal Exam:  No pedal deformities noted ?  ?Assessment: ?1. plantar wart bilateral feet ? ?  ?Plan of Care:  ?1. Patient was evaluated. ?2. Excisional debridement of the plantar wart lesion(s) was performed using a chisel blade.  Salicylic acid was applied and the lesion(s) was dressed with a dry sterile dressing. ?3.  Continue salicylic acid daily at home ?4.  Return to clinic as needed ? ? ?Edrick Kins, DPM ?[place] ? ?Dr. Edrick Kins, DPM  ?  ?[address]                                       ?[place],  [address]                ?Office [phone]  ?Fax [phone] ? ? ? ? ?

## 2021-10-26 ENCOUNTER — Encounter: Payer: Self-pay | Admitting: Oncology

## 2021-11-01 ENCOUNTER — Other Ambulatory Visit: Payer: Self-pay | Admitting: *Deleted

## 2021-11-01 DIAGNOSIS — D693 Immune thrombocytopenic purpura: Secondary | ICD-10-CM

## 2021-11-01 NOTE — Progress Notes (Signed)
error 

## 2021-11-01 NOTE — Progress Notes (Unsigned)
[address]  [phone] [phone]  ID: Erica Bartlett OB: [birth year]  MR#: [id number]  KGS#:[id number]  Patient Care Team: Olin Hauser, DO as PCP - General (Family Medicine) Chucky May, MD as Consulting Physician (Psychiatry) Lloyd Huger, MD as Consulting Physician (Oncology) Greg Cutter, LCSW as Social Worker (Licensed Clinical Social Worker)  CHIEF COMPLAINT: ITP.  INTERVAL HISTORY: Patient returns to clinic today for repeat laboratory work and further evaluation.  She currently feels well and is asymptomatic.  She denies any easy bleeding or bruising.  She has no neurologic complaints. She denies any chest pain, shortness of breath, cough, or hemoptysis.  She has no nausea, vomiting, constipation, or diarrhea. She has no urinary complaints.  Patient offers no specific complaints today.  REVIEW OF SYSTEMS:    Review of Systems  Constitutional:  Negative for fever, malaise/fatigue and weight loss.  HENT: Negative.  Negative for congestion.   Respiratory: Negative.  Negative for cough and shortness of breath.   Cardiovascular: Negative.  Negative for chest pain and leg swelling.  Gastrointestinal: Negative.  Negative for blood in stool, melena and nausea.  Genitourinary: Negative.  Negative for hematuria.  Musculoskeletal: Negative.  Negative for back pain and joint pain.  Skin: Negative.  Negative for rash.  Neurological: Negative.  Negative for sensory change, focal weakness, weakness and headaches.  Endo/Heme/Allergies:  Does not bruise/bleed easily.  Psychiatric/Behavioral: Negative.  Negative for depression. The patient is not nervous/anxious and does not have insomnia.     As per HPI. Otherwise, a complete review of systems is negative.  PAST MEDICAL HISTORY: Past Medical History:  Diagnosis Date   Hypertension     PAST SURGICAL HISTORY: Past Surgical History:  Procedure Laterality Date    ABDOMINAL HYSTERECTOMY     BREAST BIOPSY Left    age 66 surgical bx neg   CESAREAN SECTION     cyst removed     breast, benign    FAMILY HISTORY: Reviewed and unchanged. No reported history of malignancy or chronic disease.     ADVANCED DIRECTIVES:    HEALTH MAINTENANCE: Social History   Tobacco Use   Smoking status: Every Day    Packs/day: 0.15    Types: Cigarettes   Smokeless tobacco: Never   Tobacco comments:    1 PACK A WEEK  Vaping Use   Vaping Use: Never used  Substance Use Topics   Alcohol use: No   Drug use: No    Allergies  Allergen Reactions   Doxycycline Nausea And Vomiting   Penicillins Hives    Current Outpatient Medications  Medication Sig Dispense Refill   acetaminophen (TYLENOL) 500 MG tablet Take 500 mg by mouth every 6 (six) hours as needed.     benzonatate (TESSALON) 100 MG capsule Take 1 capsule (100 mg total) by mouth 3 (three) times daily as needed for cough. 30 capsule 0   cetirizine (ZYRTEC) 10 MG tablet Take 1 tablet (10 mg total) by mouth daily. 30 tablet 11   citalopram (CELEXA) 40 MG tablet Take 40 mg by mouth daily.     clonazePAM (KLONOPIN) 1 MG tablet Take 1-2 mg by mouth 3 (three) times daily as needed. Reported on 10/11/2015     diphenhydrAMINE (BENADRYL) 25 mg capsule Take 25 mg by mouth as needed.     fluticasone (FLONASE) 50 MCG/ACT nasal spray Place 2 sprays into both nostrils daily. 16 g 11   hydrochlorothiazide (HYDRODIURIL) 25 MG tablet TAKE 1 TABLET  BY MOUTH EVERY DAY 90 tablet 3   ketotifen (ZADITOR) 0.025 % ophthalmic solution Place 1 drop into both eyes 2 (two) times daily. 10 mL 0   ondansetron (ZOFRAN) 4 MG tablet Take 1 tablet (4 mg total) by mouth every 8 (eight) hours as needed for nausea or vomiting. 60 tablet 2   pantoprazole (PROTONIX) 40 MG tablet TAKE 1 TABLET BY MOUTH EVERY DAY BEFORE BREAKFAST 90 tablet 1   VENTOLIN HFA 108 (90 Base) MCG/ACT inhaler INHALE 1-2 PUFFS INTO THE LUNGS EVERY 6 (SIX) HOURS AS NEEDED  FOR WHEEZING OR SHORTNESS OF BREATH. 18 g 0   amLODipine (NORVASC) 10 MG tablet TAKE 1 TABLET BY MOUTH DAILY. NEED OFFICE VISIT FOR HYPERTENSION TO GET FURTHER REFILLS 30 tablet 0   No current facility-administered medications for this visit.    OBJECTIVE: Vitals:   11/02/21 1049 11/02/21 1052  BP:  (!) 120/56  Pulse:  (!) 56  Resp: 18   Temp: (!) 96.7 F (35.9 C)      There is no height or weight on file to calculate BMI.    ECOG FS:0 - Asymptomatic  General: Well-developed, well-nourished, no acute distress. Eyes: Pink conjunctiva, anicteric sclera. HEENT: Normocephalic, moist mucous membranes. Lungs: No audible wheezing or coughing. Heart: Regular rate and rhythm. Abdomen: Soft, nontender, no obvious distention. Musculoskeletal: No edema, cyanosis, or clubbing. Neuro: Alert, answering all questions appropriately. Cranial nerves grossly intact. Skin: No rashes or petechiae noted. Psych: Normal affect.  LAB RESULTS:  Lab Results  Component Value Date   NA 138 08/17/2016   K 3.6 08/17/2016   CL 101 08/17/2016   CO2 25 08/17/2016   GLUCOSE 81 08/17/2016   BUN 11 08/17/2016   CREATININE 0.88 08/17/2016   CALCIUM 9.4 08/17/2016   PROT 7.3 08/17/2016   ALBUMIN 4.0 08/17/2016   AST 25 08/17/2016   ALT 30 (H) 08/17/2016   ALKPHOS 85 08/17/2016   BILITOT 0.3 08/17/2016   GFRNONAA 64 08/04/2015   GFRAA 74 08/04/2015    Lab Results  Component Value Date   WBC 4.8 11/02/2021   NEUTROABS 2.8 11/02/2021   HGB 15.0 11/02/2021   HCT 43.9 11/02/2021   MCV 84.6 11/02/2021   PLT 183 11/02/2021     HEMATOLOGY HISTORY: Bone marrow biopsy performed on July 28, 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 Patient responded to steroids, but the results were not durable.  Patient has received weekly Rituxan x4 with excellent results each time completing treatment on the following dates:  1.  Aug 30, 2015. 2.  November 23, 2016. 3.  December 20, 2017.   4.   July 23, 2019. 5.  February 03, 2021.  STUDIES: No results found.  ASSESSMENT: Bone marrow biopsy proven ITP.  PLAN:    1.  ITP: See treatment data as above.  Patient platelet count is 183 today.  No intervention is needed.  Return to clinic in 3 months with repeat laboratory work and further evaluation.   2.  Leukocytosis: Resolved. 3.  Back pain: Patient does not complain of this today.  Continue evaluation and treatment per primary care. 4.  Foot pain: Patient does not complain of this today.  Continue follow-up with podiatry as scheduled.   Patient expressed understanding and was in agreement with this plan. She also understands that She can call clinic at any time with any questions, concerns, or complaints.   Timothy J Finnegan, MD   11/02/2021 2:59 PM     

## 2021-11-02 ENCOUNTER — Inpatient Hospital Stay (HOSPITAL_BASED_OUTPATIENT_CLINIC_OR_DEPARTMENT_OTHER): Payer: Medicare Other | Admitting: Oncology

## 2021-11-02 ENCOUNTER — Encounter: Payer: Self-pay | Admitting: Oncology

## 2021-11-02 ENCOUNTER — Other Ambulatory Visit: Payer: Self-pay | Admitting: Family Medicine

## 2021-11-02 ENCOUNTER — Inpatient Hospital Stay: Payer: Self-pay | Attending: Oncology

## 2021-11-02 VITALS — BP 120/56 | HR 56 | Temp 96.7°F | Resp 18

## 2021-11-02 DIAGNOSIS — D693 Immune thrombocytopenic purpura: Secondary | ICD-10-CM | POA: Diagnosis not present

## 2021-11-02 DIAGNOSIS — Z79899 Other long term (current) drug therapy: Secondary | ICD-10-CM | POA: Insufficient documentation

## 2021-11-02 DIAGNOSIS — I10 Essential (primary) hypertension: Secondary | ICD-10-CM

## 2021-11-02 DIAGNOSIS — F17210 Nicotine dependence, cigarettes, uncomplicated: Secondary | ICD-10-CM | POA: Insufficient documentation

## 2021-11-02 LAB — CBC WITH DIFFERENTIAL/PLATELET
Abs Immature Granulocytes: 0.03 10*3/uL (ref 0.00–0.07)
Basophils Absolute: 0.1 10*3/uL (ref 0.0–0.1)
Basophils Relative: 1 %
Eosinophils Absolute: 0.2 10*3/uL (ref 0.0–0.5)
Eosinophils Relative: 3 %
HCT: 43.9 % (ref 36.0–46.0)
Hemoglobin: 15 g/dL (ref 12.0–15.0)
Immature Granulocytes: 1 %
Lymphocytes Relative: 29 %
Lymphs Abs: 1.4 10*3/uL (ref 0.7–4.0)
MCH: 28.9 pg (ref 26.0–34.0)
MCHC: 34.2 g/dL (ref 30.0–36.0)
MCV: 84.6 fL (ref 80.0–100.0)
Monocytes Absolute: 0.3 10*3/uL (ref 0.1–1.0)
Monocytes Relative: 7 %
Neutro Abs: 2.8 10*3/uL (ref 1.7–7.7)
Neutrophils Relative %: 59 %
Platelets: 183 10*3/uL (ref 150–400)
RBC: 5.19 MIL/uL — ABNORMAL HIGH (ref 3.87–5.11)
RDW: 14.3 % (ref 11.5–15.5)
WBC: 4.8 10*3/uL (ref 4.0–10.5)
nRBC: 0 % (ref 0.0–0.2)

## 2021-11-02 NOTE — Telephone Encounter (Signed)
Called pt. Appt secured for 11/04/21 for med refill. 30 day courtesy refill provided. Requested Prescriptions  Pending Prescriptions Disp Refills  . amLODipine (NORVASC) 10 MG tablet [Pharmacy Med Name: AMLODIPINE BESYLATE 10 MG TAB] 90 tablet 0    Sig: TAKE 1 TABLET BY MOUTH DAILY. NEED OFFICE VISIT FOR HYPERTENSION TO GET FURTHER REFILLS     Cardiovascular: Calcium Channel Blockers 2 Passed - 11/02/2021  3:35 AM      Passed - Last BP in normal range    BP Readings from Last 1 Encounters:  11/02/21 (!) 120/56         Passed - Last Heart Rate in normal range    Pulse Readings from Last 1 Encounters:  11/02/21 (!) 56         Passed - Valid encounter within last 6 months    Recent Outpatient Visits          2 months ago Eyelid twitch   [place], DO   4 months ago Acute non-recurrent maxillary sinusitis   [place], DO   8 months ago Acute non-recurrent frontal sinusitis   [place], DO   11 months ago Acute non-recurrent maxillary sinusitis   [place], DO   1 year ago Acute non-recurrent frontal sinusitis   [place], DO      Future Appointments            In 2 days [facility], Devonne Doughty, [facility], [facility]

## 2021-11-02 NOTE — Progress Notes (Signed)
Patient here today for follow up regarding thrombocytopenia. Patient is concerned about bump on her left eye.

## 2021-11-04 ENCOUNTER — Encounter: Payer: Self-pay | Admitting: Family Medicine

## 2021-11-04 ENCOUNTER — Ambulatory Visit (INDEPENDENT_AMBULATORY_CARE_PROVIDER_SITE_OTHER): Payer: Medicare HMO | Admitting: Physician Assistant

## 2021-11-04 VITALS — BP 120/62 | HR 65 | Ht 64.0 in | Wt 175.0 lb

## 2021-11-04 DIAGNOSIS — I10 Essential (primary) hypertension: Secondary | ICD-10-CM

## 2021-11-04 DIAGNOSIS — H00011 Hordeolum externum right upper eyelid: Secondary | ICD-10-CM

## 2021-11-04 DIAGNOSIS — K219 Gastro-esophageal reflux disease without esophagitis: Secondary | ICD-10-CM

## 2021-11-04 MED ORDER — AMLODIPINE BESYLATE 10 MG PO TABS: 10.0000 mg | ORAL_TABLET | Freq: Every day | ORAL | 1 refills | Status: DC

## 2021-11-04 MED ORDER — PANTOPRAZOLE SODIUM 40 MG PO TBEC: 40.0000 mg | DELAYED_RELEASE_TABLET | Freq: Every day | ORAL | 0 refills | Status: DC

## 2021-11-04 MED ORDER — HYDROCHLOROTHIAZIDE 25 MG PO TABS: 25.0000 mg | ORAL_TABLET | Freq: Every day | ORAL | 0 refills | Status: DC

## 2021-11-04 NOTE — Progress Notes (Signed)
Established Patient Office Visit  Name: Erica Bartlett   MRN: [id number]    DOB: 05/17/1955   Date:11/04/2021  Today's Provider: Talitha Givens, MHS, PA-C Introduced myself to the patient as a PA-C and provided education on APPs in clinical practice.         Subjective  Chief Complaint  Chief Complaint  Patient presents with   Hypertension   Gastroesophageal Reflux   Stye    HPI    Hypertension: - Medications: Amlodipine 10 mg, HCTZ 25 mg  - Compliance: excellent  - Checking BP at home: Takes a few times per week and states it has been similar to today's readings  - Denies any SOB, CP, vision changes, LE edema, medication SEs, or symptoms of hypotension - Diet: Normal foods, periodic fast foods  - Exercise: Not engaged in regular exercise     GERD Currently managed with Pantoprazole  GERD control status: stable Satisfied with current treatment? yes Heartburn frequency: maybe once per week- seems to be triggered with certain foods Medication side effects: no  Medication compliance: stable Previous GERD medications:antacid tablets Antacid use frequency:  She is using a few times per year for severe exacerbations  Alleviatiating factors:   Aggravating factors: certain trigger foods, doritos, spicy things Dysphagia: no Odynophagia:  no Hematemesis: no Blood in stool: no EGD: Thinks she had this done a while ago    Stye  Noticed stye on her right upper eyelid Reports mild foreign body sensation and some blurry vision States she noticed it a few weeks ago Reports an increase in eye drainage and some eye crusting in the AM Interventions: She has tried lubricating eye drops    Patient Active Problem List   Diagnosis Date Noted   Gastroesophageal reflux disease without esophagitis 11/04/2021   Lump of right breast 03/06/2020   Goals of care, counseling/discussion 07/02/2019   Chronic maxillary sinusitis 12/23/2015   Bursitis of right shoulder  11/19/2015   Right shoulder pain 10/28/2015   Hypokalemia 08/03/2015   Idiopathic thrombocytopenic purpura ([place]) 08/02/2015   Thrombocytopenia ([place]) 07/27/2015   Bruising, spontaneous 07/26/2015   Chronic leg pain 06/28/2015   Plantar warts 06/28/2015   Fibromyalgia 05/19/2015   Allergic rhinitis 02/18/2015   Smoker 02/18/2015   Other spondylosis with radiculopathy, cervical region 10/28/2014   Other shoulder lesions, right shoulder 10/28/2014   Rotator cuff tendinitis, right 10/28/2014   Hypertension 10/14/2014    Past Surgical History:  Procedure Laterality Date   ABDOMINAL HYSTERECTOMY     BREAST BIOPSY Left    age 72 surgical bx neg   CESAREAN SECTION     cyst removed     breast, benign    Family History  Problem Relation Age of Onset   Diabetes Mother    Heart failure Father    Kidney failure Father     Social History   Tobacco Use   Smoking status: Every Day    Packs/day: 0.15    Types: Cigarettes   Smokeless tobacco: Never   Tobacco comments:    1 PACK A WEEK  Substance Use Topics   Alcohol use: No     Current Outpatient Medications:    acetaminophen (TYLENOL) 500 MG tablet, Take 500 mg by mouth every 6 (six) hours as needed., Disp: , Rfl:    cetirizine (ZYRTEC) 10 MG tablet, Take 1 tablet (10 mg total) by mouth daily., Disp: 30 tablet, Rfl: 11   citalopram (CELEXA) 40  MG tablet, Take 40 mg by mouth daily., Disp: , Rfl:    clonazePAM (KLONOPIN) 1 MG tablet, Take 1-2 mg by mouth 3 (three) times daily as needed. Reported on 10/11/2015, Disp: , Rfl:    diphenhydrAMINE (BENADRYL) 25 mg capsule, Take 25 mg by mouth as needed., Disp: , Rfl:    fluticasone (FLONASE) 50 MCG/ACT nasal spray, Place 2 sprays into both nostrils daily., Disp: 16 g, Rfl: 11   ketotifen (ZADITOR) 0.025 % ophthalmic solution, Place 1 drop into both eyes 2 (two) times daily., Disp: 10 mL, Rfl: 0   ondansetron (ZOFRAN) 4 MG tablet, Take 1 tablet (4 mg total) by mouth every 8 (eight) hours  as needed for nausea or vomiting., Disp: 60 tablet, Rfl: 2   VENTOLIN HFA 108 (90 Base) MCG/ACT inhaler, INHALE 1-2 PUFFS INTO THE LUNGS EVERY 6 (SIX) HOURS AS NEEDED FOR WHEEZING OR SHORTNESS OF BREATH., Disp: 18 g, Rfl: 0   amLODipine (NORVASC) 10 MG tablet, Take 1 tablet (10 mg total) by mouth daily., Disp: 30 tablet, Rfl: 1   hydrochlorothiazide (HYDRODIURIL) 25 MG tablet, Take 1 tablet (25 mg total) by mouth daily., Disp: 90 tablet, Rfl: 0   pantoprazole (PROTONIX) 40 MG tablet, Take 1 tablet (40 mg total) by mouth daily., Disp: 90 tablet, Rfl: 0  Allergies  Allergen Reactions   Doxycycline Nausea And Vomiting   Penicillins Hives    I personally reviewed active problem list, medication list, allergies, health maintenance, notes from last encounter, lab results with the patient/caregiver today.   Review of Systems  Eyes:  Positive for pain (foreign body sensation). Negative for blurred vision and double vision.  Respiratory:  Negative for shortness of breath and wheezing.   Cardiovascular:  Negative for chest pain, palpitations and leg swelling.  Gastrointestinal:  Negative for diarrhea, heartburn, nausea and vomiting.  Musculoskeletal:  Negative for back pain.  Neurological:  Positive for headaches (thinks they are sinus related). Negative for dizziness.      Objective  Vitals:   11/04/21 1042  BP: 120/62  Pulse: 65  SpO2: 100%  Weight: 175 lb (79.4 kg)  Height: '5\' 4"'$  (1.626 m)    Body mass index is 30.04 kg/m.  Physical Exam Vitals reviewed.  Constitutional:      General: She is awake.     Appearance: Normal appearance. She is well-developed and well-groomed. She is obese.  HENT:     Head: Normocephalic and atraumatic.  Eyes:     General: Lids are normal. Gaze aligned appropriately.        Right eye: Hordeolum present. No foreign body or discharge.        Left eye: No foreign body, discharge or hordeolum.     Pupils: Pupils are equal, round, and reactive to  light.  Cardiovascular:     Rate and Rhythm: Normal rate and regular rhythm.     Pulses: Normal pulses.     Heart sounds: No murmur heard.    No friction rub. No gallop.  Pulmonary:     Effort: Pulmonary effort is normal. No respiratory distress.     Breath sounds: Normal breath sounds. No wheezing, rhonchi or rales.  Neurological:     General: No focal deficit present.     Mental Status: She is alert and oriented to person, place, and time.  Psychiatric:        Mood and Affect: Mood normal.        Behavior: Behavior normal. Behavior is cooperative.  Thought Content: Thought content normal.        Judgment: Judgment normal.      Recent Results (from the past 2160 hour(s))  CBC with Differential     Status: Abnormal   Collection Time: 11/02/21 10:28 AM  Result Value Ref Range   WBC 4.8 4.0 - 10.5 K/uL   RBC 5.19 (H) 3.87 - 5.11 MIL/uL   Hemoglobin 15.0 12.0 - 15.0 g/dL   HCT 43.9 36.0 - 46.0 %   MCV 84.6 80.0 - 100.0 fL   MCH 28.9 26.0 - 34.0 pg   MCHC 34.2 30.0 - 36.0 g/dL   RDW 14.3 11.5 - 15.5 %   Platelets 183 150 - 400 K/uL   nRBC 0.0 0.0 - 0.2 %   Neutrophils Relative % 59 %   Neutro Abs 2.8 1.7 - 7.7 K/uL   Lymphocytes Relative 29 %   Lymphs Abs 1.4 0.7 - 4.0 K/uL   Monocytes Relative 7 %   Monocytes Absolute 0.3 0.1 - 1.0 K/uL   Eosinophils Relative 3 %   Eosinophils Absolute 0.2 0.0 - 0.5 K/uL   Basophils Relative 1 %   Basophils Absolute 0.1 0.0 - 0.1 K/uL   Immature Granulocytes 1 %   Abs Immature Granulocytes 0.03 0.00 - 0.07 K/uL    Comment: Performed at [facility], [address] [place], [place] [address]     PHQ2/9:    08/10/2021    2:43 PM 06/17/2021   10:26 AM 08/19/2020   11:04 AM 06/17/2019    1:30 PM 03/28/2019   11:22 AM  Depression screen PHQ 2/9  Decreased Interest 0 0 0 0 0  Down, Depressed, Hopeless 0 1 0 0 0  PHQ - 2 Score 0 1 0 0 0  Altered sleeping 3      Tired, decreased energy 3      Change in appetite 3       Feeling bad or failure about yourself  0      Trouble concentrating 0      Moving slowly or fidgety/restless 0      Suicidal thoughts 0      PHQ-9 Score 9      Difficult doing work/chores Somewhat difficult  Not difficult at all        Fall Risk:    08/10/2021    2:43 PM 06/17/2021   10:29 AM 08/19/2020   11:04 AM 07/16/2019   10:00 AM 06/17/2019    1:31 PM  Fall Risk   Falls in the past year? 0 0 0 0 1  Number falls in past yr: 0 0 0  0  Injury with Fall? 0 0 0  1  Risk for fall due to : No Fall Risks No Fall Risks     Follow up Falls evaluation completed Falls evaluation completed Falls evaluation completed        Functional Status Survey:      Assessment & Plan  Problem List Items Addressed This Visit       Cardiovascular and Mediastinum   Hypertension    Chronic, ongoing Appears well controlled on current medications Reports at-home measures are similar to those found in office today  She is currently taking Amlodipine 10 mg and HCTZ 25 mg and appears to be tolerating well Continue these- refills provided today Follow up as needed, recommend 3 months for monitoring        Relevant Medications   amLODipine (NORVASC) 10 MG tablet  hydrochlorothiazide (HYDRODIURIL) 25 MG tablet     Digestive   Gastroesophageal reflux disease without esophagitis - Primary    Chronic, ongoing and stable Appears well controlled with Pantoprazole 40 mg and avoidance of triggers Refill of Pantoprazole provided today per request Recommend continued avoidance of triggers and consistent preventive medication use Follow up as needed.       Relevant Medications   pantoprazole (PROTONIX) 40 MG tablet   Other Visit Diagnoses     Hordeolum externum of right upper eyelid     Acute, new problem for the past few weeks Reports mild discomfort and foreign body sensation Recommend warm compresses, avoiding eye makeup use and refraining from using contacts until this is resolved  Can  use lubricating eye drops to assist with discomfort and lid washes as needed to reduce topical irritants.  Follow up as needed.          No follow-ups on file.   I, Fonda Rochon E Shoua Ressler, PA-C, have reviewed all documentation for this visit. The documentation on 11/04/21 for the exam, diagnosis, procedures, and orders are all accurate and complete.   Talitha Givens, MHS, PA-C [facility]

## 2021-11-04 NOTE — Assessment & Plan Note (Signed)
Chronic, ongoing and stable Appears well controlled with Pantoprazole 40 mg and avoidance of triggers Refill of Pantoprazole provided today per request Recommend continued avoidance of triggers and consistent preventive medication use Follow up as needed.

## 2021-11-04 NOTE — Patient Instructions (Signed)
So for your stye I recommend the following Discard your current contact lenses and wear glasses until the stye is resolved Discard any eye makeup you have used while the stye has been present and refrain from eye makeup use until it is gone Use warm compresses and lid washes to help with resolution- gentle facial cleansers or specially formulated lid rinses can help with this  You can use lubricating eye drops to help with the discomfort If not improving or if you notice that it is getting worse let us know.

## 2021-11-04 NOTE — Assessment & Plan Note (Signed)
Chronic, ongoing Appears well controlled on current medications Reports at-home measures are similar to those found in office today  She is currently taking Amlodipine 10 mg and HCTZ 25 mg and appears to be tolerating well Continue these- refills provided today Follow up as needed, recommend 3 months for monitoring

## 2021-11-08 ENCOUNTER — Ambulatory Visit: Payer: Medicare HMO

## 2021-11-15 ENCOUNTER — Ambulatory Visit: Payer: Medicare HMO

## 2021-11-29 ENCOUNTER — Ambulatory Visit: Payer: Medicare HMO

## 2021-12-03 ENCOUNTER — Other Ambulatory Visit: Payer: Self-pay | Admitting: Family Medicine

## 2021-12-03 DIAGNOSIS — I10 Essential (primary) hypertension: Secondary | ICD-10-CM

## 2021-12-05 NOTE — Telephone Encounter (Signed)
Requested Prescriptions  Pending Prescriptions Disp Refills  . amLODipine (NORVASC) 10 MG tablet [Pharmacy Med Name: AMLODIPINE BESYLATE 10 MG TAB] 30 tablet 1    Sig: TAKE 1 TABLET BY MOUTH DAILY. NEED OFFICE VISIT FOR HYPERTENSION TO GET FURTHER REFILLS     Cardiovascular: Calcium Channel Blockers 2 Passed - 12/03/2021  1:31 PM      Passed - Last BP in normal range    BP Readings from Last 1 Encounters:  11/04/21 120/62         Passed - Last Heart Rate in normal range    Pulse Readings from Last 1 Encounters:  11/04/21 65         Passed - Valid encounter within last 6 months    Recent Outpatient Visits          1 month ago Gastroesophageal reflux disease without esophagitis   [facility] [place], Dani Gobble, [place]   3 months ago Eyelid twitch   [place], [place]   5 months ago Acute non-recurrent maxillary sinusitis   [place], [place]   9 months ago Acute non-recurrent frontal sinusitis   [place], [place]   1 year ago Acute non-recurrent maxillary sinusitis   [facility] [place], Devonne Doughty, DO

## 2022-01-25 ENCOUNTER — Other Ambulatory Visit: Payer: Self-pay

## 2022-01-25 ENCOUNTER — Inpatient Hospital Stay: Payer: No Typology Code available for payment source | Attending: Oncology

## 2022-01-25 ENCOUNTER — Emergency Department: Payer: No Typology Code available for payment source

## 2022-01-25 ENCOUNTER — Emergency Department
Admission: EM | Admit: 2022-01-25 | Discharge: 2022-01-25 | Disposition: A | Discharge status: Home / Self Care | Payer: No Typology Code available for payment source | Attending: Student in an Organized Health Care Education/Training Program | Admitting: Student in an Organized Health Care Education/Training Program

## 2022-01-25 ENCOUNTER — Encounter: Payer: Self-pay | Admitting: Oncology

## 2022-01-25 ENCOUNTER — Encounter: Payer: Self-pay | Admitting: Intensive Care

## 2022-01-25 DIAGNOSIS — S40012A Contusion of left shoulder, initial encounter: Secondary | ICD-10-CM | POA: Insufficient documentation

## 2022-01-25 DIAGNOSIS — M25512 Pain in left shoulder: Secondary | ICD-10-CM | POA: Insufficient documentation

## 2022-01-25 DIAGNOSIS — S0990XA Unspecified injury of head, initial encounter: Secondary | ICD-10-CM | POA: Diagnosis not present

## 2022-01-25 DIAGNOSIS — Z79899 Other long term (current) drug therapy: Secondary | ICD-10-CM | POA: Insufficient documentation

## 2022-01-25 DIAGNOSIS — S39012A Strain of muscle, fascia and tendon of lower back, initial encounter: Secondary | ICD-10-CM | POA: Diagnosis not present

## 2022-01-25 DIAGNOSIS — S161XXA Strain of muscle, fascia and tendon at neck level, initial encounter: Secondary | ICD-10-CM | POA: Diagnosis not present

## 2022-01-25 DIAGNOSIS — S3992XA Unspecified injury of lower back, initial encounter: Secondary | ICD-10-CM | POA: Diagnosis present

## 2022-01-25 DIAGNOSIS — D693 Immune thrombocytopenic purpura: Secondary | ICD-10-CM | POA: Diagnosis present

## 2022-01-25 HISTORY — DX: Anxiety disorder, unspecified: F41.9

## 2022-01-25 HISTORY — DX: Malignant (primary) neoplasm, unspecified: C80.1

## 2022-01-25 LAB — CBC WITH DIFFERENTIAL/PLATELET
Abs Immature Granulocytes: 0.03 10*3/uL (ref 0.00–0.07)
Basophils Absolute: 0 10*3/uL (ref 0.0–0.1)
Basophils Relative: 1 %
Eosinophils Absolute: 0.3 10*3/uL (ref 0.0–0.5)
Eosinophils Relative: 5 %
HCT: 42.7 % (ref 36.0–46.0)
Hemoglobin: 14.7 g/dL (ref 12.0–15.0)
Immature Granulocytes: 1 %
Lymphocytes Relative: 22 %
Lymphs Abs: 1.1 10*3/uL (ref 0.7–4.0)
MCH: 28.8 pg (ref 26.0–34.0)
MCHC: 34.4 g/dL (ref 30.0–36.0)
MCV: 83.6 fL (ref 80.0–100.0)
Monocytes Absolute: 0.4 10*3/uL (ref 0.1–1.0)
Monocytes Relative: 7 %
Neutro Abs: 3.2 10*3/uL (ref 1.7–7.7)
Neutrophils Relative %: 64 %
Platelets: 162 10*3/uL (ref 150–400)
RBC: 5.11 MIL/uL (ref 3.87–5.11)
RDW: 14.3 % (ref 11.5–15.5)
WBC: 5 10*3/uL (ref 4.0–10.5)
nRBC: 0 % (ref 0.0–0.2)

## 2022-01-25 MED ORDER — BACLOFEN 10 MG PO TABS: 5.0000 mg | ORAL_TABLET | Freq: Two times a day (BID) | ORAL | 0 refills | Status: DC

## 2022-01-25 MED ORDER — HYDROCODONE-ACETAMINOPHEN 5-325 MG PO TABS
1.0000 | ORAL_TABLET | ORAL | Status: AC
  Administered 2022-01-25: 1 via ORAL
  Filled 2022-01-25: qty 1

## 2022-01-25 MED ORDER — HYDROCODONE-ACETAMINOPHEN 5-325 MG PO TABS: 1.0000 | ORAL_TABLET | Freq: Four times a day (QID) | ORAL | 0 refills | Status: DC | PRN

## 2022-01-25 MED ORDER — BACLOFEN 10 MG PO TABS
5.0000 mg | ORAL_TABLET | Freq: Once | ORAL | Status: AC
  Administered 2022-01-25: 10 mg via ORAL
  Filled 2022-01-25: qty 0.5
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
## 2022-01-25 NOTE — Discharge Instructions (Signed)
Please take medications as prescribed as needed for pain.  You may use ice 20 minutes every hour over the next 2 to 3 days.  Perform gentle stretching exercises.  After 2 to 3 days you can transition to heat.  Follow-up with orthopedics if no improvement 1 week.  Return to the ER for any worsening symptoms or any urgent changes in health.

## 2022-01-26 ENCOUNTER — Telehealth: Payer: Self-pay

## 2022-01-26 NOTE — Telephone Encounter (Signed)
Transition Care Management Follow-up Telephone Call Date of discharge and from where: 01/25/22 from [facility] ER How have you been since you were released from the hospital? Patient is c/o soreness and not being able to get pain meds because they are on backorder.  I advised her to call and see if they can transfer to a pharmacy that has medication in stock.  Any questions or concerns? No  Items Reviewed: Did the pt receive and understand the discharge instructions provided? Yes  Medications obtained and verified? Yes  Other? No  Any new allergies since your discharge? No  Dietary orders reviewed? No Do you have support at home? Yes   Home Care and Equipment/Supplies: Were home health services ordered? not applicable If so, what is the name of the agency?   Has the agency set up a time to come to the patient's home? not applicable Were any new equipment or medical supplies ordered?  No What is the name of the medical supply agency?  Were you able to get the supplies/equipment? not applicable Do you have any questions related to the use of the equipment or supplies? No  Functional Questionnaire: (I = Independent and D = Dependent) ADLs: I  Bathing/Dressing- I  Meal Prep- I  Eating- I  Maintaining continence- I  Transferring/Ambulation- I  Managing Meds- I  Follow up appointments reviewed:  PCP Hospital f/u appt confirmed? Yes  Scheduled to see Dr. Parks Ranger on 01/30/22 @ 11:20. [facility] f/u appt confirmed? No   Are transportation arrangements needed? No  If their condition worsens, is the pt aware to call PCP or go to the Emergency Dept.? Yes Was the patient provided with contact information for the PCP's office or ED? Yes Was to pt encouraged to call back with questions or concerns? Yes
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
## 2022-01-30 ENCOUNTER — Encounter: Payer: Self-pay | Admitting: Family Medicine

## 2022-01-30 ENCOUNTER — Ambulatory Visit (INDEPENDENT_AMBULATORY_CARE_PROVIDER_SITE_OTHER): Payer: Medicare Other | Admitting: Family Medicine

## 2022-01-30 VITALS — BP 128/75 | HR 72 | Ht 64.0 in | Wt 161.0 lb

## 2022-01-30 DIAGNOSIS — J0110 Acute frontal sinusitis, unspecified: Secondary | ICD-10-CM | POA: Diagnosis not present

## 2022-01-30 DIAGNOSIS — S134XXA Sprain of ligaments of cervical spine, initial encounter: Secondary | ICD-10-CM

## 2022-01-30 DIAGNOSIS — G8929 Other chronic pain: Secondary | ICD-10-CM

## 2022-01-30 DIAGNOSIS — M545 Low back pain, unspecified: Secondary | ICD-10-CM | POA: Diagnosis not present

## 2022-01-30 DIAGNOSIS — M25512 Pain in left shoulder: Secondary | ICD-10-CM

## 2022-01-30 DIAGNOSIS — M7552 Bursitis of left shoulder: Secondary | ICD-10-CM

## 2022-01-30 MED ORDER — BENZONATATE 100 MG PO CAPS: 100.0000 mg | ORAL_CAPSULE | Freq: Three times a day (TID) | ORAL | 0 refills | Status: DC | PRN

## 2022-01-30 MED ORDER — HYDROCODONE-ACETAMINOPHEN 5-325 MG PO TABS: 1.0000 | ORAL_TABLET | Freq: Four times a day (QID) | ORAL | 0 refills | Status: DC | PRN

## 2022-01-30 MED ORDER — PREDNISONE 20 MG PO TABS: ORAL_TABLET | ORAL | 0 refills | Status: DC

## 2022-01-30 MED ORDER — BACLOFEN 10 MG PO TABS: 5.0000 mg | ORAL_TABLET | Freq: Two times a day (BID) | ORAL | 0 refills | Status: DC

## 2022-01-30 MED ORDER — LEVOFLOXACIN 500 MG PO TABS: 500.0000 mg | ORAL_TABLET | Freq: Every day | ORAL | 0 refills | Status: DC
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
## 2022-02-01 ENCOUNTER — Ambulatory Visit: Payer: Self-pay | Admitting: *Deleted
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
## 2022-02-02 ENCOUNTER — Inpatient Hospital Stay: Payer: No Typology Code available for payment source | Admitting: Oncology

## 2022-02-02 ENCOUNTER — Other Ambulatory Visit: Payer: Self-pay

## 2022-02-02 ENCOUNTER — Telehealth: Payer: Self-pay | Admitting: Oncology

## 2022-02-02 DIAGNOSIS — D693 Immune thrombocytopenic purpura: Secondary | ICD-10-CM

## 2022-02-02 NOTE — Telephone Encounter (Signed)
Patient called and got our answering service. She left a message to cancel her appt today 02/02/2022 and to call her back to schedule a new appt.

## 2022-02-06 ENCOUNTER — Encounter: Payer: Self-pay | Admitting: Family Medicine

## 2022-02-06 ENCOUNTER — Ambulatory Visit (INDEPENDENT_AMBULATORY_CARE_PROVIDER_SITE_OTHER): Payer: Medicare Other | Admitting: Family Medicine

## 2022-02-06 VITALS — BP 106/67 | HR 76 | Ht 64.0 in | Wt 161.0 lb

## 2022-02-06 DIAGNOSIS — M25512 Pain in left shoulder: Secondary | ICD-10-CM

## 2022-02-06 DIAGNOSIS — M545 Low back pain, unspecified: Secondary | ICD-10-CM | POA: Diagnosis not present

## 2022-02-06 DIAGNOSIS — M62830 Muscle spasm of back: Secondary | ICD-10-CM | POA: Diagnosis not present

## 2022-02-06 DIAGNOSIS — G8929 Other chronic pain: Secondary | ICD-10-CM

## 2022-02-06 DIAGNOSIS — M7552 Bursitis of left shoulder: Secondary | ICD-10-CM

## 2022-02-06 MED ORDER — METHYLPREDNISOLONE ACETATE 40 MG/ML IJ SUSP
40.0000 mg | Freq: Once | INTRAMUSCULAR | Status: AC
  Administered 2022-02-06: 40 mg via INTRA_ARTICULAR

## 2022-02-06 MED ORDER — HYDROCODONE-ACETAMINOPHEN 5-325 MG PO TABS: 1.0000 | ORAL_TABLET | ORAL | 0 refills | Status: DC | PRN

## 2022-02-06 MED ORDER — BACLOFEN 10 MG PO TABS: 5.0000 mg | ORAL_TABLET | Freq: Two times a day (BID) | ORAL | 2 refills | Status: DC

## 2022-02-06 MED ORDER — LIDOCAINE HCL (PF) 1 % IJ SOLN
4.0000 mL | Freq: Once | INTRAMUSCULAR | Status: AC
  Administered 2022-02-06: 4 mL

## 2022-02-06 NOTE — Patient Instructions (Addendum)
Thank you for coming to the office today.  You received a Left Shoulder Joint steroid injection today. - Lidocaine numbing medicine may ease the pain initially for a few hours until it wears off - As discussed, you may experience a "steroid flare" this evening or within 24-48 hours, anytime medicine is injected into an inflamed joint it can cause the pain to get worse temporarily - Everyone responds differently to these injections, it depends on the patient and the severity of the joint problem, it may provide anywhere from days to weeks, to months of relief. Ideal response is >6 months relief - Try to take it easy for next 1-2 days, avoid over activity and strain on joint (limit lifting for shoulder) - Recommend the following:   - For swelling - rest, compression sleeve / ACE wrap, elevation, and ice packs as needed for first few days   - For pain in future may use heating pad or moist heat as needed  - Given the nature of your injury duration of your pain/injury, I am concerned for rotator cuff muscle injury shoulder. If not improving as expected over next several weeks, please follow-up sooner for re-evaluation.   Referral has already been placed to Orthopedics, see below.     Please schedule a Follow-up Appointment to: Return if symptoms worsen or fail to improve.  If you have any other questions or concerns, please feel free to call the office or send a message through [place]. You may also schedule an earlier appointment if necessary.  Additionally, you may be receiving a survey about your experience at our office within a few days to 1 week by e-mail or mail. We value your feedback.  [name], DO [place]
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
## 2022-02-21 ENCOUNTER — Encounter: Payer: Self-pay | Admitting: Oncology

## 2022-02-21 ENCOUNTER — Inpatient Hospital Stay (HOSPITAL_BASED_OUTPATIENT_CLINIC_OR_DEPARTMENT_OTHER): Payer: No Typology Code available for payment source | Admitting: Oncology

## 2022-02-21 ENCOUNTER — Inpatient Hospital Stay: Payer: No Typology Code available for payment source

## 2022-02-21 VITALS — BP 127/82 | HR 67 | Temp 97.1°F | Wt 170.0 lb

## 2022-02-21 DIAGNOSIS — D693 Immune thrombocytopenic purpura: Secondary | ICD-10-CM | POA: Diagnosis not present

## 2022-02-21 LAB — CBC WITH DIFFERENTIAL/PLATELET
Abs Immature Granulocytes: 0.03 10*3/uL (ref 0.00–0.07)
Basophils Absolute: 0 10*3/uL (ref 0.0–0.1)
Basophils Relative: 1 %
Eosinophils Absolute: 0.1 10*3/uL (ref 0.0–0.5)
Eosinophils Relative: 2 %
HCT: 44.1 % (ref 36.0–46.0)
Hemoglobin: 14.9 g/dL (ref 12.0–15.0)
Immature Granulocytes: 1 %
Lymphocytes Relative: 19 %
Lymphs Abs: 1.2 10*3/uL (ref 0.7–4.0)
MCH: 28.3 pg (ref 26.0–34.0)
MCHC: 33.8 g/dL (ref 30.0–36.0)
MCV: 83.8 fL (ref 80.0–100.0)
Monocytes Absolute: 0.4 10*3/uL (ref 0.1–1.0)
Monocytes Relative: 7 %
Neutro Abs: 4.3 10*3/uL (ref 1.7–7.7)
Neutrophils Relative %: 70 %
Platelets: 98 10*3/uL — ABNORMAL LOW (ref 150–400)
RBC: 5.26 MIL/uL — ABNORMAL HIGH (ref 3.87–5.11)
RDW: 14.6 % (ref 11.5–15.5)
WBC: 6.2 10*3/uL (ref 4.0–10.5)
nRBC: 0 % (ref 0.0–0.2)
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
## 2022-02-22 ENCOUNTER — Encounter: Payer: Self-pay | Admitting: Oncology

## 2022-03-03 ENCOUNTER — Other Ambulatory Visit: Payer: Self-pay | Admitting: Family Medicine

## 2022-03-03 DIAGNOSIS — I10 Essential (primary) hypertension: Secondary | ICD-10-CM

## 2022-03-03 MED ORDER — HYDROCHLOROTHIAZIDE 25 MG PO TABS: 25.0000 mg | ORAL_TABLET | Freq: Every day | ORAL | 0 refills | Status: DC

## 2022-03-03 NOTE — Telephone Encounter (Signed)
Medication Refill - Medication: hydrochlorothiazide (HYDRODIURIL) 25 MG tablet   Has the patient contacted their pharmacy? Yes.     Preferred Pharmacy (with phone number or street name):  [id number] [place] [place] [address]: [phone] Fax: [phone]    Has the patient been seen for an appointment in the last year OR does the patient have an upcoming appointment? Yes.      Patient states pharmacy said they put refill in a couple of days ago but I do not see it listed and she hasn't heard anything. She is now out of her medicine and needs it as soon as possible

## 2022-03-03 NOTE — Telephone Encounter (Signed)
Requested Prescriptions  Pending Prescriptions Disp Refills   hydrochlorothiazide (HYDRODIURIL) 25 MG tablet 90 tablet 0    Sig: Take 1 tablet (25 mg total) by mouth daily.     Cardiovascular: Diuretics - Thiazide Failed - 03/03/2022  3:25 PM      Failed - Cr in normal range and within 180 days    Creat  Date Value Ref Range Status  08/17/2016 0.88 0.50 - 1.05 mg/dL Final    Comment:      For patients > or = 64 years of age: The upper reference limit for Creatinine is approximately 13% higher for people identified as African-American.            Failed - K in normal range and within 180 days    Potassium  Date Value Ref Range Status  08/17/2016 3.6 3.5 - 5.3 mmol/L Final         Failed - Na in normal range and within 180 days    Sodium  Date Value Ref Range Status  08/17/2016 138 135 - 146 mmol/L Final  08/04/2015 140 134 - 144 mmol/L Final         Passed - Last BP in normal range    BP Readings from Last 1 Encounters:  02/21/22 127/82         Passed - Valid encounter within last 6 months    Recent Outpatient Visits           3 weeks ago Acute bursitis of left shoulder   [place], DO   1 month ago Acute bursitis of left shoulder   [place], DO   3 months ago Gastroesophageal reflux disease without esophagitis   [facility], Dani Gobble, [place]   6 months ago Eyelid twitch   [place], DO   8 months ago Acute non-recurrent maxillary sinusitis   [place], DO       Future Appointments             In 4 days [place], Devonne Doughty, [facility], [facility]

## 2022-03-07 ENCOUNTER — Encounter: Payer: Self-pay | Admitting: Family Medicine

## 2022-03-07 ENCOUNTER — Ambulatory Visit (INDEPENDENT_AMBULATORY_CARE_PROVIDER_SITE_OTHER): Payer: Medicare Other | Admitting: Family Medicine

## 2022-03-07 DIAGNOSIS — M7552 Bursitis of left shoulder: Secondary | ICD-10-CM | POA: Diagnosis not present

## 2022-03-07 DIAGNOSIS — M545 Low back pain, unspecified: Secondary | ICD-10-CM

## 2022-03-07 DIAGNOSIS — M25512 Pain in left shoulder: Secondary | ICD-10-CM

## 2022-03-07 DIAGNOSIS — G8929 Other chronic pain: Secondary | ICD-10-CM | POA: Diagnosis not present

## 2022-03-07 MED ORDER — HYDROCODONE-ACETAMINOPHEN 5-325 MG PO TABS: 1.0000 | ORAL_TABLET | ORAL | 0 refills | Status: DC | PRN

## 2022-03-07 MED ORDER — CYCLOBENZAPRINE HCL 10 MG PO TABS: 10.0000 mg | ORAL_TABLET | Freq: Three times a day (TID) | ORAL | 0 refills | Status: DC | PRN

## 2022-03-07 MED ORDER — PREDNISONE 20 MG PO TABS: ORAL_TABLET | ORAL | 0 refills | Status: DC
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
## 2022-03-08 ENCOUNTER — Telehealth: Payer: Self-pay | Admitting: Family Medicine

## 2022-03-08 DIAGNOSIS — G8929 Other chronic pain: Secondary | ICD-10-CM

## 2022-03-08 DIAGNOSIS — M545 Low back pain, unspecified: Secondary | ICD-10-CM

## 2022-03-08 DIAGNOSIS — M7552 Bursitis of left shoulder: Secondary | ICD-10-CM

## 2022-03-08 NOTE — Telephone Encounter (Signed)
Patient would like medication sent to another pharmacy.

## 2022-03-08 NOTE — Telephone Encounter (Signed)
The [facility] [address] [facility] that her provider called in yesterday. She states it is on backorder so she is wondering if she can get [id number] [phone] Fax: [phone]   Please assist patient further

## 2022-03-10 MED ORDER — HYDROCODONE-ACETAMINOPHEN 5-325 MG PO TABS: 1.0000 | ORAL_TABLET | ORAL | 0 refills | Status: DC | PRN

## 2022-03-10 NOTE — Telephone Encounter (Signed)
Pt called asking about her pain medication.  She said she has not heard anything back from the office.  CB'[phone]'$ [phone]

## 2022-03-10 NOTE — Telephone Encounter (Signed)
Please notify patient that the rx Hydrocodone was now just sent in to the [facility].  It looks like there was a delay in the triage to get the message from the call center to our office.  The first notification our office received appears to be today 11/17 around 230pm and I have ordered it about 1 hour later now.  Thank you  [name], DO [facility] 03/10/2022, 3:40 PM

## 2022-03-19 ENCOUNTER — Encounter: Payer: Self-pay | Admitting: Oncology

## 2022-03-20 ENCOUNTER — Ambulatory Visit: Payer: Self-pay

## 2022-03-20 NOTE — Telephone Encounter (Signed)
Message from Luciana Axe sent at 03/20/2022  8:30 AM EST  Summary: Coughing sneezing advice no appts at practice   Pt is calling to report headache, congestion, dry mouth, coughing, and sneezing. Pt has back pain and when she sneezes it hurts her back. No available appts. Negative COVID test. Please advise         Chief Complaint: cough with lt yellow phlegm Symptoms: lt yellow nasal drainage, runny nose, muscle aches, sneezing, facial pain, coughing episodes that make her nauseated Frequency: yesterday Pertinent Negatives: Patient denies fever,  Disposition: '[]'$ ED /'[]'$ Urgent Care (no appt availability in office) / '[x]'$ Appointment(In office/virtual)/ '[]'$  [facility]/ '[]'$ Home Care/ '[]'$ Refused Recommended Disposition /'[]'$ [facility]/ '[]'$  Follow-up with PCP Additional Notes: VV tomorrow with Dr Parks Ranger.   Reason for Disposition  SEVERE coughing spells (e.g., whooping sound after coughing, vomiting after coughing)  Answer Assessment - Initial Assessment Questions 1. ONSET: "When did the cough begin?"      Yesterday  2. SEVERITY: "How bad is the cough today?"      Frequent cough 3. SPUTUM: "Describe the color of your sputum" (none, dry cough; clear, white, yellow, green)     Lt yellow  4. HEMOPTYSIS: "Are you coughing up any blood?" If so ask: "How much?" (flecks, streaks, tablespoons, etc.)     no 5. DIFFICULTY BREATHING: "Are you having difficulty breathing?" If Yes, ask: "How bad is it?" (e.g., mild, moderate, severe)    - MILD: No SOB at rest, mild SOB with walking, speaks normally in sentences, can lie down, no retractions, pulse < 100.    - MODERATE: SOB at rest, SOB with minimal exertion and prefers to sit, cannot lie down flat, speaks in phrases, mild retractions, audible wheezing, pulse 100-120.    - SEVERE: Very SOB at rest, speaks in single words, struggling to breathe, sitting hunched forward, retractions, pulse > 120      Mild  6. FEVER: "Do you have  a fever?" If Yes, ask: "What is your temperature, how was it measured, and when did it start?"     no 7. CARDIAC HISTORY: "Do you have any history of heart disease?" (e.g., heart attack, congestive heart failure)      no 8. LUNG HISTORY: "Do you have any history of lung disease?"  (e.g., pulmonary embolus, asthma, emphysema)     no 9. PE RISK FACTORS: "Do you have a history of blood clots?" (or: recent major surgery, recent prolonged travel, bedridden)     no 10. OTHER SYMPTOMS: "Do you have any other symptoms?" (e.g., runny nose, wheezing, chest pain)       Runny nose, muscle , sneezing, headache located temple, facial pain, nasal discharge and congestion- yellow discharge 11. PREGNANCY: "Is there any chance you are pregnant?" "When was your last menstrual period?"       N/a 12. TRAVEL: "Have you traveled out of the country in the last month?" (e.g., travel history, exposures)       N/a  Protocols used: Cough - Acute Productive-A-AH

## 2022-03-21 ENCOUNTER — Encounter: Payer: Self-pay | Admitting: Family Medicine

## 2022-03-21 ENCOUNTER — Telehealth (INDEPENDENT_AMBULATORY_CARE_PROVIDER_SITE_OTHER): Payer: Medicare Other | Admitting: Family Medicine

## 2022-03-21 VITALS — Wt 170.0 lb

## 2022-03-21 DIAGNOSIS — J111 Influenza due to unidentified influenza virus with other respiratory manifestations: Secondary | ICD-10-CM | POA: Diagnosis not present

## 2022-03-21 MED ORDER — VIRTUSSIN A/C 100-10 MG/5ML PO SOLN: 5.0000 mL | Freq: Three times a day (TID) | ORAL | 0 refills | Status: DC | PRN

## 2022-03-21 MED ORDER — OSELTAMIVIR PHOSPHATE 75 MG PO CAPS: 75.0000 mg | ORAL_CAPSULE | Freq: Two times a day (BID) | ORAL | 0 refills | Status: DC

## 2022-03-21 MED ORDER — XOFLUZA (40 MG DOSE) 2 X 20 MG PO TBPK: 40.0000 mg | ORAL_TABLET | Freq: Once | ORAL | 0 refills | Status: DC

## 2022-03-21 NOTE — Patient Instructions (Addendum)
 -   Start Flu medicine today - Based on insurance / cost coverage - either Tamiflu (anti-flu medicine) take one capsule '75mg'$  twice a day for 5 days OR Xofluza 2 pills back to back at one time (one day and then done)  - For symptom control (these are optional OTC medicines)      - Take Ibuprofen / Advil 400-'600mg'$  every 6-8 hours as needed for fever / muscle aches, and may also take Tylenol 500-'1000mg'$  per dose every 6-8 hours or 3 times a day, can alternate dosing     - May try OTC Mucinex up to 7-10 days then stop  If prescribed for you Cough syrup  - Wash hands and cover cough very well to avoid spread of infection - Improve hydration with plenty of clear fluids    If significant worsening with poor fluid intake, worsening fever, difficulty breathing due to coughing, worsening body aches, weakness, or other more concerning symptoms difficulty breathing you can seek treatment at Emergency Department. Also if improved flu symptoms and then worsening days to week later with concerns for bronchitis, productive cough fever chills again we may need to check for possible pneumonia that can occur after the flu     Please schedule a Follow-up Appointment to: Return if symptoms worsen or fail to improve.  If you have any other questions or concerns, please feel free to call the office or send a message through [facility]. You may also schedule an earlier appointment if necessary.  Additionally, you may be receiving a survey about your experience at our office within a few days to 1 week by e-mail or mail. We value your feedback.  [name], DO [place]

## 2022-03-21 NOTE — Progress Notes (Signed)
Subjective:    Patient ID: Erica Bartlett, female    DOB: 10-17-1955, 66 y.o.   MRN: [id number]  Erica Bartlett is a 66 y.o. female presenting on 03/21/2022 for Cough and Facial Pain  Virtual / Telehealth Encounter - Video Visit via MyChart The purpose of this virtual visit is to provide medical care while limiting exposure to the novel coronavirus (COVID19) for both patient and office staff.  Consent was obtained for remote visit:  Yes.   Answered questions that patient had about telehealth interaction:  Yes.   I discussed the limitations, risks, security and privacy concerns of performing an evaluation and management service by video/telephone. I also discussed with the patient that there may be a patient responsible charge related to this service. The patient expressed understanding and agreed to proceed.  Patient Location: Home Provider Location: [facility] (Office)  Participants in virtual visit: - Patient: Erica Bartlett - CMA: Orinda Kenner, CMA - Provider: Dr Parks Ranger   HPI  Influenza Family contact with influenza positive, she has been in close contact. Now she has new onset symptoms within past 24 hours with body aches chills fever cough. She requests flu treatment. She has had influenza before. No vaccination done this season.      08/10/2021    2:43 PM 06/17/2021   10:26 AM 08/19/2020   11:04 AM  Depression screen PHQ 2/9  Decreased Interest 0 0 0  Down, Depressed, Hopeless 0 1 0  PHQ - 2 Score 0 1 0  Altered sleeping 3    Tired, decreased energy 3    Change in appetite 3    Feeling bad or failure about yourself  0    Trouble concentrating 0    Moving slowly or fidgety/restless 0    Suicidal thoughts 0    PHQ-9 Score 9    Difficult doing work/chores Somewhat difficult  Not difficult at all    Social History   Tobacco Use   Smoking status: Every Day    Packs/day: 0.15    Types: Cigarettes   Smokeless tobacco: Never    Tobacco comments:    1 PACK A WEEK  Vaping Use   Vaping Use: Never used  Substance Use Topics   Alcohol use: No   Drug use: No    Review of Systems Per HPI unless specifically indicated above     Objective:    Wt 170 lb (77.1 kg)   BMI 29.18 kg/m   Wt Readings from Last 3 Encounters:  03/21/22 170 lb (77.1 kg)  03/07/22 170 lb (77.1 kg)  02/21/22 170 lb (77.1 kg)    Physical Exam  Note examination was completely remotely via video observation objective data only  Gen - well-appearing, no acute distress or apparent pain, comfortable HEENT - eyes appear clear without discharge or redness Heart/Lungs - cannot examine virtually - observed no evidence of coughing or labored breathing. Abd - cannot examine virtually  Skin - face visible today- no rash Neuro - awake, alert, oriented Psych - not anxious appearing   Results for orders placed or performed in visit on 02/21/22  CBC with Differential  Result Value Ref Range   WBC 6.2 4.0 - 10.5 K/uL   RBC 5.26 (H) 3.87 - 5.11 MIL/uL   Hemoglobin 14.9 12.0 - 15.0 g/dL   HCT 44.1 36.0 - 46.0 %   MCV 83.8 80.0 - 100.0 fL   MCH 28.3 26.0 - 34.0 pg   MCHC  33.8 30.0 - 36.0 g/dL   RDW 14.6 11.5 - 15.5 %   Platelets 98 (L) 150 - 400 K/uL   nRBC 0.0 0.0 - 0.2 %   Neutrophils Relative % 70 %   Neutro Abs 4.3 1.7 - 7.7 K/uL   Lymphocytes Relative 19 %   Lymphs Abs 1.2 0.7 - 4.0 K/uL   Monocytes Relative 7 %   Monocytes Absolute 0.4 0.1 - 1.0 K/uL   Eosinophils Relative 2 %   Eosinophils Absolute 0.1 0.0 - 0.5 K/uL   Basophils Relative 1 %   Basophils Absolute 0.0 0.0 - 0.1 K/uL   Immature Granulocytes 1 %   Abs Immature Granulocytes 0.03 0.00 - 0.07 K/uL      Assessment & Plan:   Problem List Items Addressed This Visit   None Visit Diagnoses     Influenza    -  Primary   Relevant Medications   oseltamivir (TAMIFLU) 75 MG capsule   guaiFENesin-codeine (VIRTUSSIN A/C) 100-10 MG/5ML syrup        Clinically  consistent with flu and confirmed clinical dx Influenza with close contact confirm positive influenza Visit today is virtual, no confirmation of influenza significant known exposure to positive influenza   - Duration x 1 days, without complication. Tolerating PO and well hydrated - No other focal findings of infection today - Did not receive influenza vaccine this season -   Plan: 1. Start Tamiflu '75mg'$  capsules BID x 5 days - note Xofluza was not covered, I called pharmacist and discontinued it 2. Supportive care as advised with NSAID / Tylenol PRN fever/myalgias, improve hydration, may take OTC Cold/Flu meds 3. Has Zofran ODT PRN nausea/vomiting 4. Add codeine cough syrup 5. Return criteria given if significant worsening, consider post-influenza complications, otherwise follow-up if needed   Meds ordered this encounter  Medications   DISCONTD: Baloxavir Marboxil,40 MG Dose, (XOFLUZA, 40 MG DOSE,) 2 x 20 MG TBPK    Sig: Take 40 mg by mouth once for 1 dose. For Flu    Dispense:  1 each    Refill:  0   oseltamivir (TAMIFLU) 75 MG capsule    Sig: Take 1 capsule (75 mg total) by mouth 2 (two) times daily. For 5 days    Dispense:  10 capsule    Refill:  0   guaiFENesin-codeine (VIRTUSSIN A/C) 100-10 MG/5ML syrup    Sig: Take 5 mLs by mouth 3 (three) times daily as needed for cough.    Dispense:  120 mL    Refill:  0     Follow up plan: Return if symptoms worsen or fail to improve.   Patient verbalizes understanding with the above medical recommendations including the limitation of remote medical advice.  Specific follow-up and call-back criteria were given for patient to follow-up or seek medical care more urgently if needed.  Total duration of direct patient care provided via video conference: 10 minutes   [name], [facility] 03/21/2022, 4:17 PM

## 2022-03-23 ENCOUNTER — Inpatient Hospital Stay: Payer: Self-pay | Attending: Oncology

## 2022-03-23 DIAGNOSIS — D693 Immune thrombocytopenic purpura: Secondary | ICD-10-CM | POA: Insufficient documentation

## 2022-03-23 LAB — CBC WITH DIFFERENTIAL/PLATELET
Abs Immature Granulocytes: 0.08 10*3/uL — ABNORMAL HIGH (ref 0.00–0.07)
Basophils Absolute: 0.1 10*3/uL (ref 0.0–0.1)
Basophils Relative: 1 %
Eosinophils Absolute: 0.1 10*3/uL (ref 0.0–0.5)
Eosinophils Relative: 1 %
HCT: 45.1 % (ref 36.0–46.0)
Hemoglobin: 15.4 g/dL — ABNORMAL HIGH (ref 12.0–15.0)
Immature Granulocytes: 1 %
Lymphocytes Relative: 31 %
Lymphs Abs: 2.6 10*3/uL (ref 0.7–4.0)
MCH: 28.3 pg (ref 26.0–34.0)
MCHC: 34.1 g/dL (ref 30.0–36.0)
MCV: 82.9 fL (ref 80.0–100.0)
Monocytes Absolute: 0.6 10*3/uL (ref 0.1–1.0)
Monocytes Relative: 7 %
Neutro Abs: 5.1 10*3/uL (ref 1.7–7.7)
Neutrophils Relative %: 59 %
Platelets: 52 10*3/uL — ABNORMAL LOW (ref 150–400)
RBC: 5.44 MIL/uL — ABNORMAL HIGH (ref 3.87–5.11)
RDW: 14.6 % (ref 11.5–15.5)
WBC: 8.5 10*3/uL (ref 4.0–10.5)
nRBC: 0 % (ref 0.0–0.2)

## 2022-03-24 ENCOUNTER — Other Ambulatory Visit: Payer: Self-pay | Admitting: Oncology

## 2022-03-27 ENCOUNTER — Other Ambulatory Visit: Payer: Self-pay | Admitting: Family Medicine

## 2022-03-27 ENCOUNTER — Other Ambulatory Visit: Payer: Self-pay | Admitting: Oncology

## 2022-03-27 DIAGNOSIS — D693 Immune thrombocytopenic purpura: Secondary | ICD-10-CM

## 2022-03-27 DIAGNOSIS — K219 Gastro-esophageal reflux disease without esophagitis: Secondary | ICD-10-CM

## 2022-03-27 MED ORDER — PANTOPRAZOLE SODIUM 40 MG PO TBEC: 40.0000 mg | DELAYED_RELEASE_TABLET | Freq: Every day | ORAL | 3 refills | Status: DC

## 2022-03-27 NOTE — Telephone Encounter (Signed)
Medication Refill - Medication: pantoprazole (PROTONIX) 40 MG tablet   Has the patient contacted their pharmacy? Yes.     Preferred Pharmacy (with phone number or street name):  Has the patient been seen for an appointment in the last year [id number] [place] [place]: [phone] Fax: [phone]    R does the patient have an upcoming appointment? Yes.    Please assist patient further

## 2022-03-27 NOTE — Telephone Encounter (Signed)
Requested Prescriptions  Pending Prescriptions Disp Refills   pantoprazole (PROTONIX) 40 MG tablet 90 tablet 3    Sig: Take 1 tablet (40 mg total) by mouth daily.     Gastroenterology: Proton Pump Inhibitors Passed - 03/27/2022 10:26 AM      Passed - Valid encounter within last 12 months    Recent Outpatient Visits           6 days ago Influenza   [place], DO   2 weeks ago Chronic left shoulder pain   [place], DO   1 month ago Acute bursitis of left shoulder   [place], DO   1 month ago Acute bursitis of left shoulder   [place], DO   4 months ago Gastroesophageal reflux disease without esophagitis   [facility], Dani Gobble, [place]

## 2022-03-28 ENCOUNTER — Other Ambulatory Visit: Payer: Self-pay

## 2022-03-30 ENCOUNTER — Encounter: Payer: Self-pay | Admitting: Family Medicine

## 2022-03-30 ENCOUNTER — Ambulatory Visit (INDEPENDENT_AMBULATORY_CARE_PROVIDER_SITE_OTHER): Payer: Medicare Other | Admitting: Family Medicine

## 2022-03-30 VITALS — BP 124/78 | HR 73 | Ht 64.0 in | Wt 170.0 lb

## 2022-03-30 DIAGNOSIS — G8929 Other chronic pain: Secondary | ICD-10-CM

## 2022-03-30 DIAGNOSIS — M545 Low back pain, unspecified: Secondary | ICD-10-CM | POA: Diagnosis not present

## 2022-03-30 DIAGNOSIS — S134XXD Sprain of ligaments of cervical spine, subsequent encounter: Secondary | ICD-10-CM

## 2022-03-30 DIAGNOSIS — M25512 Pain in left shoulder: Secondary | ICD-10-CM | POA: Diagnosis not present

## 2022-03-30 DIAGNOSIS — M62830 Muscle spasm of back: Secondary | ICD-10-CM | POA: Diagnosis not present
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
## 2022-03-30 NOTE — Patient Instructions (Addendum)
Thank you for coming to the office today.  Letter today  Good luck with chemo  Keep on medication for back pain as needed for now, let me know.  BP is actually normal range. The high reading was false alarm.  Please schedule a Follow-up Appointment to: Return if symptoms worsen or fail to improve.  If you have any other questions or concerns, please feel free to call the office or send a message through [place]. You may also schedule an earlier appointment if necessary.  Additionally, you may be receiving a survey about your experience at our office within a few days to 1 week by e-mail or mail. We value your feedback.  [name], DO [place]

## 2022-03-31 ENCOUNTER — Inpatient Hospital Stay: Payer: Medicare Other | Attending: Oncology

## 2022-03-31 ENCOUNTER — Ambulatory Visit: Payer: Self-pay

## 2022-03-31 ENCOUNTER — Inpatient Hospital Stay (HOSPITAL_BASED_OUTPATIENT_CLINIC_OR_DEPARTMENT_OTHER): Payer: Medicare Other | Admitting: Oncology

## 2022-03-31 ENCOUNTER — Encounter: Payer: Self-pay | Admitting: Oncology

## 2022-03-31 ENCOUNTER — Inpatient Hospital Stay: Payer: Medicare Other

## 2022-03-31 VITALS — BP 111/59 | HR 79 | Temp 96.3°F | Resp 18

## 2022-03-31 DIAGNOSIS — D693 Immune thrombocytopenic purpura: Secondary | ICD-10-CM | POA: Diagnosis not present

## 2022-03-31 DIAGNOSIS — Z79899 Other long term (current) drug therapy: Secondary | ICD-10-CM | POA: Diagnosis not present

## 2022-03-31 DIAGNOSIS — Z5111 Encounter for antineoplastic chemotherapy: Secondary | ICD-10-CM | POA: Insufficient documentation

## 2022-03-31 DIAGNOSIS — F17210 Nicotine dependence, cigarettes, uncomplicated: Secondary | ICD-10-CM | POA: Diagnosis not present

## 2022-03-31 DIAGNOSIS — E876 Hypokalemia: Secondary | ICD-10-CM | POA: Diagnosis not present

## 2022-03-31 LAB — CBC WITH DIFFERENTIAL/PLATELET
Abs Immature Granulocytes: 0.08 10*3/uL — ABNORMAL HIGH (ref 0.00–0.07)
Basophils Absolute: 0 10*3/uL (ref 0.0–0.1)
Basophils Relative: 1 %
Eosinophils Absolute: 0.2 10*3/uL (ref 0.0–0.5)
Eosinophils Relative: 3 %
HCT: 44.4 % (ref 36.0–46.0)
Hemoglobin: 15 g/dL (ref 12.0–15.0)
Immature Granulocytes: 1 %
Lymphocytes Relative: 18 %
Lymphs Abs: 1.3 10*3/uL (ref 0.7–4.0)
MCH: 28.1 pg (ref 26.0–34.0)
MCHC: 33.8 g/dL (ref 30.0–36.0)
MCV: 83.1 fL (ref 80.0–100.0)
Monocytes Absolute: 0.4 10*3/uL (ref 0.1–1.0)
Monocytes Relative: 6 %
Neutro Abs: 5 10*3/uL (ref 1.7–7.7)
Neutrophils Relative %: 71 %
Platelets: 76 10*3/uL — ABNORMAL LOW (ref 150–400)
RBC: 5.34 MIL/uL — ABNORMAL HIGH (ref 3.87–5.11)
RDW: 14.8 % (ref 11.5–15.5)
WBC: 7.1 10*3/uL (ref 4.0–10.5)
nRBC: 0 % (ref 0.0–0.2)

## 2022-03-31 LAB — HEPATITIS B CORE ANTIBODY, TOTAL: Hep B Core Total Ab: NONREACTIVE

## 2022-03-31 LAB — HEPATITIS B SURFACE ANTIGEN: Hepatitis B Surface Ag: NONREACTIVE

## 2022-03-31 MED ORDER — DIPHENHYDRAMINE HCL 50 MG/ML IJ SOLN
50.0000 mg | Freq: Once | INTRAMUSCULAR | Status: AC | PRN
  Administered 2022-03-31: 25 mg via INTRAVENOUS

## 2022-03-31 MED ORDER — ACETAMINOPHEN 325 MG PO TABS
650.0000 mg | ORAL_TABLET | Freq: Once | ORAL | Status: AC
  Administered 2022-03-31: 650 mg via ORAL
  Filled 2022-03-31: qty 2

## 2022-03-31 MED ORDER — FAMOTIDINE IN NACL 20-0.9 MG/50ML-% IV SOLN: 20.0000 mg | Freq: Once | INTRAVENOUS | Status: DC | PRN

## 2022-03-31 MED ORDER — SODIUM CHLORIDE 0.9 % IV SOLN
Freq: Once | INTRAVENOUS | Status: DC | PRN
  Filled 2022-03-31: qty 250

## 2022-03-31 MED ORDER — DIPHENHYDRAMINE HCL 50 MG/ML IJ SOLN
25.0000 mg | Freq: Once | INTRAMUSCULAR | Status: AC
  Administered 2022-03-31: 25 mg via INTRAVENOUS
  Filled 2022-03-31: qty 1

## 2022-03-31 MED ORDER — SODIUM CHLORIDE 0.9 % IV SOLN
10.0000 mg | Freq: Once | INTRAVENOUS | Status: AC
  Administered 2022-03-31: 10 mg via INTRAVENOUS
  Filled 2022-03-31: qty 10

## 2022-03-31 MED ORDER — SODIUM CHLORIDE 0.9 % IV SOLN
375.0000 mg/m2 | Freq: Once | INTRAVENOUS | Status: AC
  Administered 2022-03-31: 700 mg via INTRAVENOUS
  Filled 2022-03-31: qty 20

## 2022-03-31 MED ORDER — METHYLPREDNISOLONE SODIUM SUCC 125 MG IJ SOLR
125.0000 mg | Freq: Once | INTRAMUSCULAR | Status: AC | PRN
  Administered 2022-03-31: 125 mg via INTRAVENOUS

## 2022-03-31 MED ORDER — SODIUM CHLORIDE 0.9 % IV SOLN
Freq: Once | INTRAVENOUS | Status: AC
  Filled 2022-03-31: qty 250

## 2022-03-31 MED ORDER — FAMOTIDINE IN NACL 20-0.9 MG/50ML-% IV SOLN
20.0000 mg | Freq: Once | INTRAVENOUS | Status: AC
  Administered 2022-03-31: 20 mg via INTRAVENOUS
  Filled 2022-03-31: qty 50

## 2022-03-31 NOTE — Patient Instructions (Signed)
[facility] CANCER CTR AT [place]  Discharge Instructions: Thank you for choosing [facility] to provide your oncology and hematology care.  If you have a lab appointment with the [facility], please go directly to the [facility] and check in at the registration area.  Wear comfortable clothing and clothing appropriate for easy access to any Portacath or PICC line.   We strive to give you quality time with your provider. You may need to reschedule your appointment if you arrive late (15 or more minutes).  Arriving late affects you and other patients whose appointments are after yours.  Also, if you miss three or more appointments without notifying the office, you may be dismissed from the clinic at the provider's discretion.      For prescription refill requests, have your pharmacy contact our office and allow 72 hours for refills to be completed.    Today you received the following chemotherapy and/or immunotherapy agents TRUMIXA      To help prevent nausea and vomiting after your treatment, we encourage you to take your nausea medication as directed.  BELOW ARE SYMPTOMS THAT SHOULD BE REPORTED IMMEDIATELY: *FEVER GREATER THAN 100.4 F (38 C) OR HIGHER *CHILLS OR SWEATING *NAUSEA AND VOMITING THAT IS NOT CONTROLLED WITH YOUR NAUSEA MEDICATION *UNUSUAL SHORTNESS OF BREATH *UNUSUAL BRUISING OR BLEEDING *URINARY PROBLEMS (pain or burning when urinating, or frequent urination) *BOWEL PROBLEMS (unusual diarrhea, constipation, pain near the anus) TENDERNESS IN MOUTH AND THROAT WITH OR WITHOUT PRESENCE OF ULCERS (sore throat, sores in mouth, or a toothache) UNUSUAL RASH, SWELLING OR PAIN  UNUSUAL VAGINAL DISCHARGE OR ITCHING   Items with * indicate a potential emergency and should be followed up as soon as possible or go to the Emergency Department if any problems should occur.  Please show the CHEMOTHERAPY ALERT CARD or IMMUNOTHERAPY ALERT CARD at check-in to the  Emergency Department and triage nurse.  Should you have questions after your visit or need to cancel or reschedule your appointment, please contact [facility] CANCER [place] AT [place]  [phone] and follow the prompts.  Office hours are 8:00 a.m. to 4:30 p.m. Monday - Friday. Please note that voicemails left after 4:00 p.m. may not be returned until the following business day.  We are closed weekends and major holidays. You have access to a nurse at all times for urgent questions. Please call the main number to the clinic [phone] and follow the prompts.  For any non-urgent questions, you may also contact your provider using MyChart. We now offer e-Visits for anyone 64 and older to request care online for non-urgent symptoms. For details visit [email or web address].   Also download the MyChart app! Go to the app store, search "MyChart", open the app, select [facility], and log in with your MyChart username and password.  Masks are optional in the cancer centers. If you would like for your care team to wear a mask while they are taking care of you, please let them know. For doctor visits, patients may have with them one support person who is at least 64 years old. At this time, visitors are not allowed in the infusion area.  Rituximab Injection What is this medication? RITUXIMAB (ri TUX i mab) treats leukemia and lymphoma. It works by blocking a protein that causes cancer cells to grow and multiply. This helps to slow or stop the spread of cancer cells. It may also be used to treat autoimmune conditions, such as arthritis. It works by slowing  down an overactive immune system. It is a monoclonal antibody. This medicine may be used for other purposes; ask your health care provider or pharmacist if you have questions. COMMON BRAND NAME(S): RIABNI, Rituxan, RUXIENCE, truxima What should I tell my care team before I take this medication? They need to know if you have any of these  conditions: Chest pain Heart disease Immune system problems Infection, such as chickenpox, cold sores, hepatitis B, herpes Irregular heartbeat or rhythm Kidney disease Low blood counts, such as low white cells, platelets, red cells Lung disease Recent or upcoming vaccine An unusual or allergic reaction to rituximab, other medications, foods, dyes, or preservatives Pregnant or trying to get pregnant Breast-feeding How should I use this medication? This medication is injected into a vein. It is given by a care team in a hospital or clinic setting. A special MedGuide will be given to you before each treatment. Be sure to read this information carefully each time. Talk to your care team about the use of this medication in children. While this medication may be prescribed for children as young as 6 months for selected conditions, precautions do apply. Overdosage: If you think you have taken too much of this medicine contact a poison control center or emergency room at once. NOTE: This medicine is only for you. Do not share this medicine with others. What if I miss a dose? Keep appointments for follow-up doses. It is important not to miss your dose. Call your care team if you are unable to keep an appointment. What may interact with this medication? Do not take this medication with any of the following: Live vaccines This medication may also interact with the following: Cisplatin This list may not describe all possible interactions. Give your health care provider a list of all the medicines, herbs, non-prescription drugs, or dietary supplements you use. Also tell them if you smoke, drink alcohol, or use illegal drugs. Some items may interact with your medicine. What should I watch for while using this medication? Your condition will be monitored carefully while you are receiving this medication. You may need blood work while taking this medication. This medication can cause serious infusion  reactions. To reduce the risk your care team may give you other medications to take before receiving this one. Be sure to follow the directions from your care team. This medication may increase your risk of getting an infection. Call your care team for advice if you get a fever, chills, sore throat, or other symptoms of a cold or flu. Do not treat yourself. Try to avoid being around people who are sick. Call your care team if you are around anyone with measles, chickenpox, or if you develop sores or blisters that do not heal properly. Avoid taking medications that contain aspirin, acetaminophen, ibuprofen, naproxen, or ketoprofen unless instructed by your care team. These medications may hide a fever. This medication may cause serious skin reactions. They can happen weeks to months after starting the medication. Contact your care team right away if you notice fevers or flu-like symptoms with a rash. The rash may be red or purple and then turn into blisters or peeling of the skin. You may also notice a red rash with swelling of the face, lips, or lymph nodes in your neck or under your arms. In some patients, this medication may cause a serious brain infection that may cause death. If you have any problems seeing, thinking, speaking, walking, or standing, tell your care team right away.  If you cannot reach your care team, urgently seek another source of medical care. Talk to your care team if you may be pregnant. Serious birth defects can occur if you take this medication during pregnancy and for 12 months after the last dose. You will need a negative pregnancy test before starting this medication. Contraception is recommended while taking this medication and for 12 months after the last dose. Your care team can help you find the option that works for you. Do not breastfeed while taking this medication and for at least 6 months after the last dose. What side effects may I notice from receiving this  medication? Side effects that you should report to your care team as soon as possible: Allergic reactions or angioedema--skin rash, itching or hives, swelling of the face, eyes, lips, tongue, arms, or legs, trouble swallowing or breathing Bowel blockage--stomach cramping, unable to have a bowel movement or pass gas, loss of appetite, vomiting Dizziness, loss of balance or coordination, confusion or trouble speaking Heart attack--pain or tightness in the chest, shoulders, arms, or jaw, nausea, shortness of breath, cold or clammy skin, feeling faint or lightheaded Heart rhythm changes--fast or irregular heartbeat, dizziness, feeling faint or lightheaded, chest pain, trouble breathing Infection--fever, chills, cough, sore throat, wounds that don't heal, pain or trouble when passing urine, general feeling of discomfort or being unwell Infusion reactions--chest pain, shortness of breath or trouble breathing, feeling faint or lightheaded Kidney injury--decrease in the amount of urine, swelling of the ankles, hands, or feet Liver injury--right upper belly pain, loss of appetite, nausea, light-colored stool, dark yellow or brown urine, yellowing skin or eyes, unusual weakness or fatigue Redness, blistering, peeling, or loosening of the skin, including inside the mouth Stomach pain that is severe, does not go away, or gets worse Tumor lysis syndrome (TLS)--nausea, vomiting, diarrhea, decrease in the amount of urine, dark urine, unusual weakness or fatigue, confusion, muscle pain or cramps, fast or irregular heartbeat, joint pain Side effects that usually do not require medical attention (report to your care team if they continue or are bothersome): Headache Joint pain Nausea Runny or stuffy nose Unusual weakness or fatigue This list may not describe all possible side effects. Call your doctor for medical advice about side effects. You may report side effects to FDA at 1-800-FDA-1088. Where should I keep  my medication? This medication is given in a hospital or clinic. It will not be stored at home. NOTE: This sheet is a summary. It may not cover all possible information. If you have questions about this medicine, talk to your doctor, pharmacist, or health care provider.  2023 Elsevier/Gold Standard (2021-08-23 00:00:00)
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
## 2022-03-31 NOTE — Progress Notes (Signed)
1154- pt called to nurse to say throat burning.   Truxima stopped. 1 L IVF at 99 started. Alease Medina CRNP paged. BP 118/99 HR 78  1156- Benadryl 25 mg IV given, states Vagina burning  1157- Solumedrol 125 mg IV given  1158- BP 129/68 HR 82 - sats 100% on RA. Throat and vagina feel the same.  Pt States "this is not new for me , this happens all the time."  No resp distress noted.   1159- throat and vagina improving. Alease Medina CRNP chairside.  1201 "feels like thoat is closing"  No distress noted.  1203- 132/97 HR 78 Sat 100%. "Feels Better"  5790- per L Allen CRNP- give fluids for 15 min and restart at 50%  rate if pt is feeling better.  1220- pt on phone taking   1221- pt ambulated to bathroom   1227- restarted at 50 % - so rate 34 VTBI 17  1545- pt completed bag- no other issues. Dr Grayland Ormond and Alease Medina CRNP aware.

## 2022-04-06 MED FILL — Dexamethasone Sodium Phosphate Inj 100 MG/10ML: INTRAMUSCULAR | Qty: 1 | Status: AC

## 2022-04-07 ENCOUNTER — Inpatient Hospital Stay: Payer: Medicare Other

## 2022-04-07 ENCOUNTER — Encounter: Payer: Self-pay | Admitting: Oncology

## 2022-04-07 ENCOUNTER — Inpatient Hospital Stay (HOSPITAL_BASED_OUTPATIENT_CLINIC_OR_DEPARTMENT_OTHER): Payer: Medicare Other | Admitting: Oncology

## 2022-04-07 ENCOUNTER — Inpatient Hospital Stay: Payer: Medicare Other | Admitting: Licensed Clinical Social Worker

## 2022-04-07 VITALS — BP 115/63 | HR 84 | Temp 96.9°F | Resp 18

## 2022-04-07 DIAGNOSIS — D693 Immune thrombocytopenic purpura: Secondary | ICD-10-CM

## 2022-04-07 DIAGNOSIS — Z5111 Encounter for antineoplastic chemotherapy: Secondary | ICD-10-CM | POA: Diagnosis not present

## 2022-04-07 LAB — CBC WITH DIFFERENTIAL/PLATELET
Abs Immature Granulocytes: 0.04 10*3/uL (ref 0.00–0.07)
Basophils Absolute: 0 10*3/uL (ref 0.0–0.1)
Basophils Relative: 1 %
Eosinophils Absolute: 0.3 10*3/uL (ref 0.0–0.5)
Eosinophils Relative: 5 %
HCT: 42.3 % (ref 36.0–46.0)
Hemoglobin: 14.8 g/dL (ref 12.0–15.0)
Immature Granulocytes: 1 %
Lymphocytes Relative: 18 %
Lymphs Abs: 1.2 10*3/uL (ref 0.7–4.0)
MCH: 29 pg (ref 26.0–34.0)
MCHC: 35 g/dL (ref 30.0–36.0)
MCV: 82.9 fL (ref 80.0–100.0)
Monocytes Absolute: 0.5 10*3/uL (ref 0.1–1.0)
Monocytes Relative: 7 %
Neutro Abs: 4.6 10*3/uL (ref 1.7–7.7)
Neutrophils Relative %: 68 %
Platelets: 98 10*3/uL — ABNORMAL LOW (ref 150–400)
RBC: 5.1 MIL/uL (ref 3.87–5.11)
RDW: 14.6 % (ref 11.5–15.5)
WBC: 6.7 10*3/uL (ref 4.0–10.5)
nRBC: 0 % (ref 0.0–0.2)

## 2022-04-07 MED ORDER — ACETAMINOPHEN 325 MG PO TABS
650.0000 mg | ORAL_TABLET | Freq: Once | ORAL | Status: AC
  Administered 2022-04-07: 650 mg via ORAL
  Filled 2022-04-07: qty 2

## 2022-04-07 MED ORDER — SODIUM CHLORIDE 0.9 % IV SOLN
10.0000 mg | Freq: Once | INTRAVENOUS | Status: AC
  Administered 2022-04-07: 10 mg via INTRAVENOUS
  Filled 2022-04-07: qty 10

## 2022-04-07 MED ORDER — DIPHENHYDRAMINE HCL 50 MG/ML IJ SOLN
25.0000 mg | Freq: Once | INTRAMUSCULAR | Status: AC
  Administered 2022-04-07: 25 mg via INTRAVENOUS
  Filled 2022-04-07: qty 1

## 2022-04-07 MED ORDER — FAMOTIDINE IN NACL 20-0.9 MG/50ML-% IV SOLN
20.0000 mg | Freq: Once | INTRAVENOUS | Status: AC
  Administered 2022-04-07: 20 mg via INTRAVENOUS
  Filled 2022-04-07: qty 50

## 2022-04-07 MED ORDER — SODIUM CHLORIDE 0.9 % IV SOLN
375.0000 mg/m2 | Freq: Once | INTRAVENOUS | Status: AC
  Administered 2022-04-07: 700 mg via INTRAVENOUS
  Filled 2022-04-07: qty 50

## 2022-04-07 MED ORDER — SODIUM CHLORIDE 0.9 % IV SOLN
Freq: Once | INTRAVENOUS | Status: AC
  Filled 2022-04-07: qty 250

## 2022-04-07 MED ORDER — PROCHLORPERAZINE MALEATE 10 MG PO TABS: 10.0000 mg | ORAL_TABLET | Freq: Four times a day (QID) | ORAL | 0 refills | Status: DC | PRN
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
## 2022-04-09 ENCOUNTER — Other Ambulatory Visit: Payer: Self-pay | Admitting: Family Medicine

## 2022-04-09 DIAGNOSIS — I10 Essential (primary) hypertension: Secondary | ICD-10-CM

## 2022-04-10 ENCOUNTER — Telehealth: Payer: Self-pay | Admitting: Family Medicine

## 2022-04-10 NOTE — Telephone Encounter (Signed)
This is a duplicate request, already requested by pharmacy via interface on 04/09/22 encounter.

## 2022-04-10 NOTE — Telephone Encounter (Signed)
Requested medication (s) are due for refill today: yes  Requested medication (s) are on the active medication list: yes  Last refill:  12/05/21 #90/0  Future visit scheduled: no  Notes to clinic:  Unable to refill per protocol, appointment needed. Called pt, unable to LVM. Pt is due for FU on HTN for refills.       Requested Prescriptions  Pending Prescriptions Disp Refills   amLODipine (NORVASC) 10 MG tablet [Pharmacy Med Name: AMLODIPINE BESYLATE 10 MG TAB] 90 tablet 0    Sig: TAKE 1 TABLET BY MOUTH DAILY. NEED OFFICE VISIT FOR HYPERTENSION TO GET FURTHER REFILLS     Cardiovascular: Calcium Channel Blockers 2 Passed - 04/09/2022  1:11 PM      Passed - Last BP in normal range    BP Readings from Last 1 Encounters:  04/07/22 115/63         Passed - Last Heart Rate in normal range    Pulse Readings from Last 1 Encounters:  04/07/22 84         Passed - Valid encounter within last 6 months    Recent Outpatient Visits           1 week ago Chronic left shoulder pain   [facility] [place], Devonne Doughty, DO   2 weeks ago Influenza   [place], DO   1 month ago Chronic left shoulder pain   [place], DO   2 months ago Acute bursitis of left shoulder   [place], DO   2 months ago Acute bursitis of left shoulder   [place], Devonne Doughty, [place]

## 2022-04-10 NOTE — Telephone Encounter (Signed)
Copied from [place] [id number]. Topic: General - Other >> Apr 10, 2022  3:12 PM Everette C wrote: Reason for CRM: Medication Refill - Medication: amLODipine (NORVASC) 10 MG tablet [[id number]]  Has the patient contacted their pharmacy? Yes.  The patient has been directed to contact their PCP  (Agent: If no, request that the patient contact the pharmacy for the refill. If patient does not wish to contact the pharmacy document the reason why and proceed with request.) (Agent: If yes, when and what did the pharmacy advise?)  Preferred Pharmacy (with phone number or street name): [id number] [place], [place] [address] [place] [place]Phone: [phone]: [phone]: Not open 24 hours   Has the patient been seen for an appointment in the last year OR does the patient have an upcoming appointment? Yes.    Agent: Please be advised that RX refills may take up to 3 business days. We ask that you follow-up with your pharmacy.

## 2022-04-10 NOTE — Telephone Encounter (Signed)
Called pt, unable to LVM. Pt is due for FU on HTN for refills.

## 2022-04-11 ENCOUNTER — Encounter: Payer: Self-pay | Admitting: Oncology

## 2022-04-11 NOTE — Progress Notes (Signed)
Enrolled patient into the [employer].  

## 2022-04-12 NOTE — Telephone Encounter (Signed)
Pt is calling back to follow up on medication refill. Pt is upset stated she is out of her medication.   Please advise.

## 2022-04-13 MED FILL — Dexamethasone Sodium Phosphate Inj 100 MG/10ML: INTRAMUSCULAR | Qty: 1 | Status: AC

## 2022-04-14 ENCOUNTER — Inpatient Hospital Stay (HOSPITAL_BASED_OUTPATIENT_CLINIC_OR_DEPARTMENT_OTHER): Payer: Medicare Other | Admitting: Medical Oncology

## 2022-04-14 ENCOUNTER — Other Ambulatory Visit: Payer: Self-pay | Admitting: *Deleted

## 2022-04-14 ENCOUNTER — Ambulatory Visit
Admission: RE | Admit: 2022-04-14 | Discharge: 2022-04-14 | Disposition: A | Discharge status: Home / Self Care | Payer: Self-pay | Source: Ambulatory Visit | Attending: Medical Oncology | Admitting: Medical Oncology

## 2022-04-14 ENCOUNTER — Other Ambulatory Visit: Payer: Self-pay

## 2022-04-14 ENCOUNTER — Telehealth: Payer: Self-pay | Admitting: Internal Medicine

## 2022-04-14 ENCOUNTER — Inpatient Hospital Stay: Payer: Medicare Other

## 2022-04-14 ENCOUNTER — Encounter: Payer: Self-pay | Admitting: Medical Oncology

## 2022-04-14 VITALS — BP 124/79 | HR 75 | Temp 97.8°F | Resp 18 | Ht 64.0 in | Wt 167.0 lb

## 2022-04-14 VITALS — BP 142/79 | HR 87 | Temp 96.0°F | Resp 17

## 2022-04-14 DIAGNOSIS — D693 Immune thrombocytopenic purpura: Secondary | ICD-10-CM

## 2022-04-14 DIAGNOSIS — R112 Nausea with vomiting, unspecified: Secondary | ICD-10-CM | POA: Diagnosis not present

## 2022-04-14 DIAGNOSIS — M79601 Pain in right arm: Secondary | ICD-10-CM

## 2022-04-14 DIAGNOSIS — D72829 Elevated white blood cell count, unspecified: Secondary | ICD-10-CM | POA: Diagnosis not present

## 2022-04-14 DIAGNOSIS — Z5111 Encounter for antineoplastic chemotherapy: Secondary | ICD-10-CM | POA: Diagnosis not present

## 2022-04-14 LAB — CBC WITH DIFFERENTIAL/PLATELET
Abs Immature Granulocytes: 0.06 10*3/uL (ref 0.00–0.07)
Basophils Absolute: 0 10*3/uL (ref 0.0–0.1)
Basophils Relative: 0 %
Eosinophils Absolute: 0.4 10*3/uL (ref 0.0–0.5)
Eosinophils Relative: 6 %
HCT: 40.7 % (ref 36.0–46.0)
Hemoglobin: 14.2 g/dL (ref 12.0–15.0)
Immature Granulocytes: 1 %
Lymphocytes Relative: 17 %
Lymphs Abs: 1.3 10*3/uL (ref 0.7–4.0)
MCH: 28.9 pg (ref 26.0–34.0)
MCHC: 34.9 g/dL (ref 30.0–36.0)
MCV: 82.9 fL (ref 80.0–100.0)
Monocytes Absolute: 0.5 10*3/uL (ref 0.1–1.0)
Monocytes Relative: 7 %
Neutro Abs: 5.6 10*3/uL (ref 1.7–7.7)
Neutrophils Relative %: 69 %
Platelets: 152 10*3/uL (ref 150–400)
RBC: 4.91 MIL/uL (ref 3.87–5.11)
RDW: 14.6 % (ref 11.5–15.5)
WBC: 8 10*3/uL (ref 4.0–10.5)
nRBC: 0 % (ref 0.0–0.2)

## 2022-04-14 MED ORDER — ACETAMINOPHEN 325 MG PO TABS
650.0000 mg | ORAL_TABLET | Freq: Once | ORAL | Status: AC
  Administered 2022-04-14: 650 mg via ORAL
  Filled 2022-04-14: qty 2

## 2022-04-14 MED ORDER — SODIUM CHLORIDE 0.9 % IV SOLN
Freq: Once | INTRAVENOUS | Status: AC
  Filled 2022-04-14: qty 250

## 2022-04-14 MED ORDER — FAMOTIDINE IN NACL 20-0.9 MG/50ML-% IV SOLN
20.0000 mg | Freq: Once | INTRAVENOUS | Status: AC
  Administered 2022-04-14: 20 mg via INTRAVENOUS
  Filled 2022-04-14: qty 50

## 2022-04-14 MED ORDER — SODIUM CHLORIDE 0.9 % IV SOLN
375.0000 mg/m2 | Freq: Once | INTRAVENOUS | Status: AC
  Administered 2022-04-14: 700 mg via INTRAVENOUS
  Filled 2022-04-14: qty 50

## 2022-04-14 MED ORDER — SODIUM CHLORIDE 0.9 % IV SOLN
10.0000 mg | Freq: Once | INTRAVENOUS | Status: AC
  Administered 2022-04-14: 10 mg via INTRAVENOUS
  Filled 2022-04-14: qty 10

## 2022-04-14 MED ORDER — DIPHENHYDRAMINE HCL 50 MG/ML IJ SOLN
25.0000 mg | Freq: Once | INTRAMUSCULAR | Status: AC
  Administered 2022-04-14: 25 mg via INTRAVENOUS
  Filled 2022-04-14: qty 1

## 2022-04-14 NOTE — Telephone Encounter (Signed)
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 It was performed due to pain.   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 Continue with supportive care. Can consider repeat dopplers in 2 week to assess for extension.   Will defer to Dr. Grayland Ormond.

## 2022-04-14 NOTE — Patient Instructions (Signed)
[facility] AT [place]  Discharge Instructions: Thank you for choosing [facility] to provide your oncology and hematology care.  If you have a lab appointment with the [facility], please go directly to the [facility] and check in at the registration area.  Wear comfortable clothing and clothing appropriate for easy access to any Portacath or PICC line.   We strive to give you quality time with your provider. You may need to reschedule your appointment if you arrive late (15 or more minutes).  Arriving late affects you and other patients whose appointments are after yours.  Also, if you miss three or more appointments without notifying the office, you may be dismissed from the clinic at the provider's discretion.      For prescription refill requests, have your pharmacy contact our office and allow 72 hours for refills to be completed.    Today you received the following chemotherapy and/or immunotherapy agents- Rituximab      To help prevent nausea and vomiting after your treatment, we encourage you to take your nausea medication as directed.  BELOW ARE SYMPTOMS THAT SHOULD BE REPORTED IMMEDIATELY: *FEVER GREATER THAN 100.4 F (38 C) OR HIGHER *CHILLS OR SWEATING *NAUSEA AND VOMITING THAT IS NOT CONTROLLED WITH YOUR NAUSEA MEDICATION *UNUSUAL SHORTNESS OF BREATH *UNUSUAL BRUISING OR BLEEDING *URINARY PROBLEMS (pain or burning when urinating, or frequent urination) *BOWEL PROBLEMS (unusual diarrhea, constipation, pain near the anus) TENDERNESS IN MOUTH AND THROAT WITH OR WITHOUT PRESENCE OF ULCERS (sore throat, sores in mouth, or a toothache) UNUSUAL RASH, SWELLING OR PAIN  UNUSUAL VAGINAL DISCHARGE OR ITCHING   Items with * indicate a potential emergency and should be followed up as soon as possible or go to the Emergency Department if any problems should occur.  Please show the CHEMOTHERAPY ALERT CARD or IMMUNOTHERAPY ALERT CARD at check-in to  the Emergency Department and triage nurse.  Should you have questions after your visit or need to cancel or reschedule your appointment, please contact [facility] [place] AT [place]  [phone] and follow the prompts.  Office hours are 8:00 a.m. to 4:30 p.m. Monday - Friday. Please note that voicemails left after 4:00 p.m. may not be returned until the following business day.  We are closed weekends and major holidays. You have access to a nurse at all times for urgent questions. Please call the main number to the clinic [phone] and follow the prompts.  For any non-urgent questions, you may also contact your provider using MyChart. We now offer e-Visits for anyone 75 and older to request care online for non-urgent symptoms. For details visit [email or web address].   Also download the MyChart app! Go to the app store, search "MyChart", open the app, select [facility], and log in with your MyChart username and password.  Masks are optional in the cancer centers. If you would like for your care team to wear a mask while they are taking care of you, please let them know. For doctor visits, patients may have with them one support person who is at least 63 years old. At this time, visitors are not allowed in the infusion area.

## 2022-04-14 NOTE — Progress Notes (Signed)
[facility]  Telephone:(336) [phone] Fax:(336) [phone]  ID: Erica Bartlett OB: 1956-03-06  MR#: [id number]  LOV#:[id number]  Patient Care Team: Olin Hauser, DO as PCP - General (Family Medicine) Chucky May, MD as Consulting Physician (Psychiatry) Lloyd Huger, MD as Consulting Physician (Oncology) Greg Cutter, LCSW as Social Worker (Licensed Clinical Social Worker)  CHIEF COMPLAINT: ITP.  INTERVAL HISTORY: Patient returns to clinic today for further evaluation and consideration of cycle 3 of weekly Rituxan.    She states that she does not sleep well the night before her appointments but is otherwise well. She is tolerating her chemotherapy fairly well with mild occasional nausea. Her concerns today is that she is still having some right upper arm pain that started during her last treatment. Occurred in the arm her vital were taken not the arm where IV was placed. Dr. Grayland Ormond assessed this area last week and did not suspect anything of significant concern. The area is still red and a bit tender to touch. No edema of lower arm, pallor or coolness to distal arm. She has not tried anything for this area.   She denies any easy bleeding or bruising.  She has no neurologic complaints. She denies any chest pain, shortness of breath, cough, or hemoptysis.  She has no vomiting, constipation, or diarrhea. She has no urinary complaints.  Patient offers no further specific complaints today.  REVIEW OF SYSTEMS:    Review of Systems  Constitutional:  Negative for fever, malaise/fatigue and weight loss.  HENT: Negative.  Negative for congestion.   Respiratory: Negative.  Negative for cough and shortness of breath.   Cardiovascular: Negative.  Negative for chest pain and leg swelling.  Gastrointestinal:  Positive for nausea. Negative for blood in stool and melena.  Genitourinary: Negative.  Negative for hematuria.  Musculoskeletal:  Positive for joint  pain. Negative for back pain and myalgias.  Skin: Negative.  Negative for rash.  Neurological: Negative.  Negative for sensory change, focal weakness, weakness and headaches.  Endo/Heme/Allergies:  Does not bruise/bleed easily.  Psychiatric/Behavioral: Negative.  Negative for depression. The patient is not nervous/anxious and does not have insomnia.     As per HPI. Otherwise, a complete review of systems is negative.  PAST MEDICAL HISTORY: Past Medical History:  Diagnosis Date   Anxiety    Cancer ([place])    Hypertension     PAST SURGICAL HISTORY: Past Surgical History:  Procedure Laterality Date   ABDOMINAL HYSTERECTOMY     BREAST BIOPSY Left    age 32 surgical bx neg   CESAREAN SECTION     cyst removed     breast, benign    FAMILY HISTORY: Reviewed and unchanged. No reported history of malignancy or chronic disease.     ADVANCED DIRECTIVES:    HEALTH MAINTENANCE: Social History   Tobacco Use   Smoking status: Every Day    Packs/day: 0.15    Types: Cigarettes   Smokeless tobacco: Never   Tobacco comments:    1 PACK A WEEK  Vaping Use   Vaping Use: Never used  Substance Use Topics   Alcohol use: No   Drug use: No    Allergies  Allergen Reactions   Doxycycline Nausea And Vomiting   Penicillins Hives    Current Outpatient Medications  Medication Sig Dispense Refill   acetaminophen (TYLENOL) 500 MG tablet Take 500 mg by mouth every 6 (six) hours as needed.     amLODipine (NORVASC) 10 MG tablet  Take 1 tablet (10 mg total) by mouth daily. 90 tablet 1   baclofen (LIORESAL) 10 MG tablet Take 0.5 tablets (5 mg total) by mouth 2 (two) times daily. 60 each 2   cetirizine (ZYRTEC) 10 MG tablet Take 1 tablet (10 mg total) by mouth daily. 30 tablet 11   citalopram (CELEXA) 40 MG tablet Take 40 mg by mouth daily.     clonazePAM (KLONOPIN) 1 MG tablet Take 1-2 mg by mouth 3 (three) times daily as needed. Reported on 10/11/2015     cyclobenzaprine (FLEXERIL) 10 MG tablet  Take 1 tablet (10 mg total) by mouth 3 (three) times daily as needed for muscle spasms. 30 tablet 0   diphenhydrAMINE (BENADRYL) 25 mg capsule Take 25 mg by mouth as needed.     fluticasone (FLONASE) 50 MCG/ACT nasal spray Place 2 sprays into both nostrils daily. 16 g 11   hydrochlorothiazide (HYDRODIURIL) 25 MG tablet Take 1 tablet (25 mg total) by mouth daily. 90 tablet 0   HYDROcodone-acetaminophen (NORCO) 5-325 MG tablet Take 1 tablet by mouth every 4 (four) hours as needed for moderate pain. 90 tablet 0   ketotifen (ZADITOR) 0.025 % ophthalmic solution Place 1 drop into both eyes 2 (two) times daily. 10 mL 0   ondansetron (ZOFRAN) 4 MG tablet Take 1 tablet (4 mg total) by mouth every 8 (eight) hours as needed for nausea or vomiting. 60 tablet 2   pantoprazole (PROTONIX) 40 MG tablet Take 1 tablet (40 mg total) by mouth daily. 90 tablet 3   prochlorperazine (COMPAZINE) 10 MG tablet Take 1 tablet (10 mg total) by mouth every 6 (six) hours as needed for nausea or vomiting. 30 tablet 0   VENTOLIN HFA 108 (90 Base) MCG/ACT inhaler INHALE 1-2 PUFFS INTO THE LUNGS EVERY 6 (SIX) HOURS AS NEEDED FOR WHEEZING OR SHORTNESS OF BREATH. 18 g 0   No current facility-administered medications for this visit.   Facility-Administered Medications Ordered in Other Visits  Medication Dose Route Frequency Provider Last Rate Last Admin   0.9 %  sodium chloride infusion   Intravenous Once Grayland Ormond, Kathlene November, MD       acetaminophen (TYLENOL) tablet 650 mg  650 mg Oral Once Lloyd Huger, MD       dexamethasone (DECADRON) 10 mg in sodium chloride 0.9 % 50 mL IVPB  10 mg Intravenous Once Lloyd Huger, MD       diphenhydrAMINE (BENADRYL) injection 25 mg  25 mg Intravenous Once Lloyd Huger, MD       famotidine (PEPCID) IVPB 20 mg premix  20 mg Intravenous Once Lloyd Huger, MD       riTUXimab-abbs (TRUXIMA) 700 mg in sodium chloride 0.9 % 250 mL (2.1875 mg/mL) infusion  375 mg/m2 (Treatment  Plan Recorded) Intravenous Once Lloyd Huger, MD        OBJECTIVE: Vitals:   04/14/22 0915  BP: 124/79  Pulse: 75  Resp: 18  Temp: 97.8 F (36.6 C)      Body mass index is 28.67 kg/m.    ECOG FS:0 - Asymptomatic  General: Well-developed, well-nourished, no acute distress. Eyes: Pink conjunctiva, anicteric sclera. HEENT: Normocephalic, moist mucous membranes. Lungs: No audible wheezing or coughing. Heart: Regular rate and rhythm. Radial pulses intact bilaterally  Abdomen: Soft, nontender, no obvious distention. Musculoskeletal: No edema, cyanosis, or clubbing. Neuro: Alert, answering all questions appropriately. Cranial nerves grossly intact. Skin: No rashes. The right dorsal upper arm has a 2 inch long  vertical band of mild to moderate erythema with mild tenderness. No skin breakdown, discharge or ulceration.  Psych: Normal affect.  LAB RESULTS:  Lab Results  Component Value Date   NA 138 08/17/2016   K 3.6 08/17/2016   CL 101 08/17/2016   CO2 25 08/17/2016   GLUCOSE 81 08/17/2016   BUN 11 08/17/2016   CREATININE 0.88 08/17/2016   CALCIUM 9.4 08/17/2016   PROT 7.3 08/17/2016   ALBUMIN 4.0 08/17/2016   AST 25 08/17/2016   ALT 30 (H) 08/17/2016   ALKPHOS 85 08/17/2016   BILITOT 0.3 08/17/2016   GFRNONAA 64 08/04/2015   GFRAA 74 08/04/2015    Lab Results  Component Value Date   WBC 8.0 04/14/2022   NEUTROABS 5.6 04/14/2022   HGB 14.2 04/14/2022   HCT 40.7 04/14/2022   MCV 82.9 04/14/2022   PLT 152 04/14/2022     HEMATOLOGY HISTORY: Bone marrow biopsy performed on July 28, 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 Patient responded to steroids, but the results were not durable.  Patient has received weekly Rituxan x4 with excellent results each time completing treatment on the following dates:  1.  Aug 30, 2015. 2.  November 23, 2016. 3.  December 20, 2017.   4.  July 23, 2019. 5.  February 03, 2021. 6.  April 21, 2022  STUDIES: No results found.  ASSESSMENT: Bone marrow biopsy proven ITP.  PLAN:    1.  ITP: See treatment data as above.  Patient's platelet count has improved to 152.  Proceed with cycle 3 of 4 weekly Rituxan today.  Return to clinic in 1 week for further evaluation and consideration of cycle 4.   2.  Leukocytosis: Resolved. Will continue to monitor  3.  Right arm pain: New to me. Likely phlebitis - not suspecting DVT or cellulitis given exam and history. Will doppler to ensure no sign of DVT. For now warm compress, continue her steroids.  4.  Nausea: Managed with compazine. Not mentioned today   Disposition Cycle 3 Rituxan today Doppler stat RTC 1 week MD, labs (CBC w/, BMP), +_ Cycle 4/4 Rituxan -[place]  Patient expressed understanding and was in agreement with this plan. She also understands that She can call clinic at any time with any questions, concerns, or complaints.   DALEYZA GADOMSKI, PA-C   04/14/2022 10:10 AM

## 2022-04-20 MED FILL — Dexamethasone Sodium Phosphate Inj 100 MG/10ML: INTRAMUSCULAR | Qty: 1 | Status: AC

## 2022-04-21 ENCOUNTER — Inpatient Hospital Stay: Payer: Medicare Other

## 2022-04-21 ENCOUNTER — Inpatient Hospital Stay (HOSPITAL_BASED_OUTPATIENT_CLINIC_OR_DEPARTMENT_OTHER): Payer: Medicare Other | Admitting: Oncology

## 2022-04-21 ENCOUNTER — Encounter: Payer: Self-pay | Admitting: Oncology

## 2022-04-21 VITALS — BP 117/65 | HR 73 | Temp 98.0°F

## 2022-04-21 DIAGNOSIS — D693 Immune thrombocytopenic purpura: Secondary | ICD-10-CM

## 2022-04-21 DIAGNOSIS — Z5111 Encounter for antineoplastic chemotherapy: Secondary | ICD-10-CM | POA: Diagnosis not present

## 2022-04-21 LAB — CBC WITH DIFFERENTIAL/PLATELET
Abs Immature Granulocytes: 0.05 10*3/uL (ref 0.00–0.07)
Basophils Absolute: 0 10*3/uL (ref 0.0–0.1)
Basophils Relative: 1 %
Eosinophils Absolute: 0.4 10*3/uL (ref 0.0–0.5)
Eosinophils Relative: 5 %
HCT: 39.9 % (ref 36.0–46.0)
Hemoglobin: 13.8 g/dL (ref 12.0–15.0)
Immature Granulocytes: 1 %
Lymphocytes Relative: 17 %
Lymphs Abs: 1.4 10*3/uL (ref 0.7–4.0)
MCH: 28.9 pg (ref 26.0–34.0)
MCHC: 34.6 g/dL (ref 30.0–36.0)
MCV: 83.5 fL (ref 80.0–100.0)
Monocytes Absolute: 0.5 10*3/uL (ref 0.1–1.0)
Monocytes Relative: 6 %
Neutro Abs: 5.8 10*3/uL (ref 1.7–7.7)
Neutrophils Relative %: 70 %
Platelets: 194 10*3/uL (ref 150–400)
RBC: 4.78 MIL/uL (ref 3.87–5.11)
RDW: 14.6 % (ref 11.5–15.5)
WBC: 8.1 10*3/uL (ref 4.0–10.5)
nRBC: 0 % (ref 0.0–0.2)

## 2022-04-21 LAB — BASIC METABOLIC PANEL
Anion gap: 8 (ref 5–15)
BUN: 15 mg/dL (ref 8–23)
CO2: 28 mmol/L (ref 22–32)
Calcium: 9.3 mg/dL (ref 8.9–10.3)
Chloride: 104 mmol/L (ref 98–111)
Creatinine, Ser: 0.97 mg/dL (ref 0.44–1.00)
GFR, Estimated: >60 mL/min (ref >60)
Glucose, Bld: 113 mg/dL — ABNORMAL HIGH (ref 70–99)
Potassium: 3 mmol/L — ABNORMAL LOW (ref 3.5–5.1)
Sodium: 140 mmol/L (ref 135–145)

## 2022-04-21 MED ORDER — SODIUM CHLORIDE 0.9 % IV SOLN
Freq: Once | INTRAVENOUS | Status: AC
  Filled 2022-04-21: qty 250

## 2022-04-21 MED ORDER — ACETAMINOPHEN 325 MG PO TABS
650.0000 mg | ORAL_TABLET | Freq: Once | ORAL | Status: AC
  Administered 2022-04-21: 650 mg via ORAL
  Filled 2022-04-21: qty 2

## 2022-04-21 MED ORDER — SODIUM CHLORIDE 0.9 % IV SOLN
10.0000 mg | Freq: Once | INTRAVENOUS | Status: AC
  Administered 2022-04-21: 10 mg via INTRAVENOUS
  Filled 2022-04-21: qty 10

## 2022-04-21 MED ORDER — POTASSIUM CHLORIDE CRYS ER 20 MEQ PO TBCR: 20.0000 meq | EXTENDED_RELEASE_TABLET | Freq: Every day | ORAL | 1 refills | Status: DC

## 2022-04-21 MED ORDER — DIPHENHYDRAMINE HCL 50 MG/ML IJ SOLN
25.0000 mg | Freq: Once | INTRAMUSCULAR | Status: AC
  Administered 2022-04-21: 25 mg via INTRAVENOUS
  Filled 2022-04-21: qty 1

## 2022-04-21 MED ORDER — FAMOTIDINE IN NACL 20-0.9 MG/50ML-% IV SOLN
20.0000 mg | Freq: Once | INTRAVENOUS | Status: AC
  Administered 2022-04-21: 20 mg via INTRAVENOUS
  Filled 2022-04-21: qty 50

## 2022-04-21 MED ORDER — SODIUM CHLORIDE 0.9 % IV SOLN
375.0000 mg/m2 | Freq: Once | INTRAVENOUS | Status: AC
  Administered 2022-04-21: 700 mg via INTRAVENOUS
  Filled 2022-04-21: qty 50

## 2022-04-21 NOTE — Progress Notes (Signed)
Wants to review recent US results.
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
## 2022-04-21 NOTE — Addendum Note (Signed)
Addended by: Philipp Deputy on: 04/21/2022 02:36 PM   Modules accepted: Orders

## 2022-04-22 ENCOUNTER — Other Ambulatory Visit: Payer: Self-pay

## 2022-05-12 ENCOUNTER — Other Ambulatory Visit: Payer: Self-pay

## 2022-05-14 ENCOUNTER — Other Ambulatory Visit: Payer: Self-pay | Admitting: Oncology

## 2022-05-15 NOTE — Telephone Encounter (Signed)
Component Ref Range & Units 3 wk ago (04/21/22) 5 yr ago (08/17/16) 6 yr ago (08/04/15) 6 yr ago (07/29/15) 6 yr ago (07/28/15) 6 yr ago (07/27/15) 6 yr ago (07/26/15)  Potassium 3.5 - 5.1 mmol/L 3.0 Low  3.6 R 3.5 R 4.2 3.3 Low  3.2 Low  4.0 R

## 2022-05-19 ENCOUNTER — Telehealth: Payer: Self-pay
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
## 2022-05-19 NOTE — Telephone Encounter (Signed)
Erica Bartlett,  When you review this next week, can you assist with this patient request for a new orthopedic specialist?  I see there was difficulty with previous referral from October.  I believe I need more information to assist. Would you be able to help identify which orthopedic office she needs that is in network?  We tried [facility] and there were problems. I am not sure if [facility] or if she can go to [facility]?  [name], [facility] 05/19/2022, 7:11 PM

## 2022-05-29 ENCOUNTER — Other Ambulatory Visit: Payer: Self-pay | Admitting: Family Medicine

## 2022-05-29 DIAGNOSIS — I10 Essential (primary) hypertension: Secondary | ICD-10-CM

## 2022-05-30 NOTE — Telephone Encounter (Signed)
Requested Prescriptions  Pending Prescriptions Disp Refills   hydrochlorothiazide (HYDRODIURIL) 25 MG tablet [Pharmacy Med Name: HYDROCHLOROTHIAZIDE 25 MG TAB] 90 tablet 0    Sig: TAKE 1 TABLET (25 MG TOTAL) BY MOUTH DAILY.     Cardiovascular: Diuretics - Thiazide Failed - 05/29/2022  1:29 AM      Failed - K in normal range and within 180 days    Potassium  Date Value Ref Range Status  04/21/2022 3.0 (L) 3.5 - 5.1 mmol/L Final         Passed - Cr in normal range and within 180 days    Creat  Date Value Ref Range Status  08/17/2016 0.88 0.50 - 1.05 mg/dL Final    Comment:      For patients > or = 65 years of age: The upper reference limit for Creatinine is approximately 13% higher for people identified as African-American.      Creatinine, Ser  Date Value Ref Range Status  04/21/2022 0.97 0.44 - 1.00 mg/dL Final         Passed - Na in normal range and within 180 days    Sodium  Date Value Ref Range Status  04/21/2022 140 135 - 145 mmol/L Final  08/04/2015 140 134 - 144 mmol/L Final         Passed - Last BP in normal range    BP Readings from Last 1 Encounters:  04/21/22 117/65         Passed - Valid encounter within last 6 months    Recent Outpatient Visits           2 months ago Chronic left shoulder pain   [facility] Olin Hauser, DO   2 months ago Influenza   [place] J, DO   2 months ago Chronic left shoulder pain   [place] J, DO   3 months ago Acute bursitis of left shoulder   [place] J, DO   4 months ago Acute bursitis of left shoulder   [facility] [place], Devonne Doughty, [place]

## 2022-06-05 ENCOUNTER — Ambulatory Visit (INDEPENDENT_AMBULATORY_CARE_PROVIDER_SITE_OTHER): Payer: Medicare Other | Admitting: Family Medicine

## 2022-06-05 ENCOUNTER — Encounter: Payer: Self-pay | Admitting: Family Medicine

## 2022-06-05 VITALS — BP 107/70 | HR 77 | Temp 98.7°F | Ht 64.0 in | Wt 168.4 lb

## 2022-06-05 DIAGNOSIS — Z20828 Contact with and (suspected) exposure to other viral communicable diseases: Secondary | ICD-10-CM | POA: Diagnosis not present

## 2022-06-05 DIAGNOSIS — B349 Viral infection, unspecified: Secondary | ICD-10-CM | POA: Diagnosis not present

## 2022-06-05 LAB — POCT INFLUENZA A/B
Influenza A, POC: NEGATIVE
Influenza B, POC: NEGATIVE

## 2022-06-05 MED ORDER — HYDROCOD POLI-CHLORPHE POLI ER 10-8 MG/5ML PO SUER: 5.0000 mL | Freq: Two times a day (BID) | ORAL | 0 refills | Status: DC | PRN

## 2022-06-05 MED ORDER — OSELTAMIVIR PHOSPHATE 75 MG PO CAPS: 75.0000 mg | ORAL_CAPSULE | Freq: Every day | ORAL | 0 refills | Status: DC

## 2022-06-05 MED ORDER — PREDNISONE 20 MG PO TABS: ORAL_TABLET | ORAL | 0 refills | Status: DC
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
## 2022-06-05 NOTE — Patient Instructions (Addendum)
Thank you for coming to the office today.  Your flu test was NEGATIVE, this is not 100% though, and you can still have the flu with a negative test, otherwise it could be a different viral syndrome.  - Start Flu medicine today, take one daily for 10 days to prevent the Flu  START Prednisone taper 7 day While on Prednisone, do NOT take aleve advil ibuprofen Once finished you may start Ibuprofen IF you have body aches and symptoms  Tussionex cough syrup as needed.  For symptom control (these are optional OTC medicines)      - Take Ibuprofen / Advil 400-63m every 6-8 hours as needed for fever / muscle aches, and may also take Tylenol 500-1008mper dose every 6-8 hours or 3 times a day, can alternate dosing  - Wash hands and cover cough very well to avoid spread of infection - Improve hydration with plenty of clear fluids    If significant worsening with poor fluid intake, worsening fever, difficulty breathing due to coughing, worsening body aches, weakness, or other more concerning symptoms difficulty breathing you can seek treatment at Emergency Department. Also if improved flu symptoms and then worsening days to week later with concerns for bronchitis, productive cough fever chills again we may need to check for possible pneumonia that can occur after the flu      Please schedule a Follow-up Appointment to: Return if symptoms worsen or fail to improve.  If you have any other questions or concerns, please feel free to call the office or send a message through [facility] may also schedule an earlier appointment if necessary.  Additionally, you may be receiving a survey about your experience at our office within a few days to 1 week by e-mail or mail. We value your feedback.  AlNobie PutnamDO [facility]

## 2022-06-07 MED ORDER — GUAIFENESIN-CODEINE 100-10 MG/5ML PO SYRP: 5.0000 mL | ORAL_SOLUTION | Freq: Two times a day (BID) | ORAL | 0 refills | Status: DC | PRN

## 2022-06-07 NOTE — Addendum Note (Signed)
Addended by: Olin Hauser on: 06/07/2022 01:24 PM   Modules accepted: Orders

## 2022-06-09 ENCOUNTER — Telehealth: Payer: Self-pay | Admitting: Family Medicine

## 2022-06-09 DIAGNOSIS — B349 Viral infection, unspecified: Secondary | ICD-10-CM

## 2022-06-09 MED ORDER — BENZONATATE 100 MG PO CAPS: 100.0000 mg | ORAL_CAPSULE | Freq: Three times a day (TID) | ORAL | 1 refills | Status: DC | PRN

## 2022-06-09 MED ORDER — HYDROCODONE BIT-HOMATROP MBR 5-1.5 MG/5ML PO SOLN: 5.0000 mL | Freq: Three times a day (TID) | ORAL | 0 refills | Status: DC | PRN

## 2022-06-09 NOTE — Telephone Encounter (Signed)
Pt is calling to report that the pharmacy does not have guaiFENesin-codeine (ROBITUSSIN AC) 100-10 MG/5ML syrup NO:[id number] . Pt  like to know can Dr. Raliegh Ip call the [facility] instead?  Preferred Pharmacy [phone]

## 2022-06-09 NOTE — Telephone Encounter (Signed)
Called patient. Sent rx Lewayne Bunting and switch syrup to Hycodan  [name], DO [facility] 06/09/2022, 4:57 PM

## 2022-06-15 ENCOUNTER — Other Ambulatory Visit: Payer: Self-pay | Admitting: Family Medicine

## 2022-06-15 ENCOUNTER — Ambulatory Visit: Payer: Self-pay

## 2022-06-15 DIAGNOSIS — J0110 Acute frontal sinusitis, unspecified: Secondary | ICD-10-CM

## 2022-06-15 MED ORDER — LEVOFLOXACIN 500 MG PO TABS: 500.0000 mg | ORAL_TABLET | Freq: Every day | ORAL | 0 refills | Status: DC

## 2022-06-15 MED ORDER — PSEUDOEPH-BROMPHEN-DM 30-2-10 MG/5ML PO SYRP: 5.0000 mL | ORAL_SOLUTION | Freq: Four times a day (QID) | ORAL | 0 refills | Status: DC | PRN

## 2022-06-15 NOTE — Telephone Encounter (Signed)
Attempted to call patient. Did not reach her. I will agree to call in antibiotic and unfortunately I am not sure what other cough medicine is available. I received a notice from pharmacy that the initial cough syrup was not available due to back order, so it was already changed once. There are not many other options for cough medicine.  [name], [facility] 06/15/2022, 12:14 PM

## 2022-06-15 NOTE — Telephone Encounter (Addendum)
     Chief Complaint: "My cough is worse. "The cough medicine is not working." Coughing up yellow mucus. Declines OV, requests additional medication. Symptoms: Cough, SOB with exertion. Asking for antibiotic and different cough medicine Frequency: This week. Pertinent Negatives: Patient denies fever Disposition: []$ ED /[]$ Urgent Care (no appt availability in office) / []$ Appointment(In office/virtual)/ []$  [place] [facility]/ []$ Home Care/ []$ Refused Recommended Disposition /[]$ [facility]/ [x]$  Follow-up with PCP Additional Notes: Please advise pt.  Answer Assessment - Initial Assessment Questions 1. ONSET: "When did the cough begin?"      Tuesday 2. SEVERITY: "How bad is the cough today?"      Severe 3. SPUTUM: "Describe the color of your sputum" (none, dry cough; clear, white, yellow, green)     Yellow 4. HEMOPTYSIS: "Are you coughing up any blood?" If so ask: "How much?" (flecks, streaks, tablespoons, etc.)     No 5. DIFFICULTY BREATHING: "Are you having difficulty breathing?" If Yes, ask: "How bad is it?" (e.g., mild, moderate, severe)    - MILD: No SOB at rest, mild SOB with walking, speaks normally in sentences, can lie down, no retractions, pulse < 100.    - MODERATE: SOB at rest, SOB with minimal exertion and prefers to sit, cannot lie down flat, speaks in phrases, mild retractions, audible wheezing, pulse 100-120.    - SEVERE: Very SOB at rest, speaks in single words, struggling to breathe, sitting hunched forward, retractions, pulse > 120      With exertion 6. FEVER: "Do you have a fever?" If Yes, ask: "What is your temperature, how was it measured, and when did it start?"     No 7. CARDIAC HISTORY: "Do you have any history of heart disease?" (e.g., heart attack, congestive heart failure)      No 8. LUNG HISTORY: "Do you have any history of lung disease?"  (e.g., pulmonary embolus, asthma, emphysema)     No 9. PE RISK FACTORS: "Do you have a history of blood clots?"  (or: recent major surgery, recent prolonged travel, bedridden)     No 10. OTHER SYMPTOMS: "Do you have any other symptoms?" (e.g., runny nose, wheezing, chest pain)       SOB 11. PREGNANCY: "Is there any chance you are pregnant?" "When was your last menstrual period?"       No 12. TRAVEL: "Have you traveled out of the country in the last month?" (e.g., travel history, exposures)       No  Protocols used: Cough - Acute Productive-A-AH

## 2022-06-22 ENCOUNTER — Encounter: Payer: Self-pay | Admitting: Oncology

## 2022-06-23 ENCOUNTER — Ambulatory Visit (INDEPENDENT_AMBULATORY_CARE_PROVIDER_SITE_OTHER): Payer: Medicaid Other

## 2022-06-23 ENCOUNTER — Other Ambulatory Visit: Payer: Self-pay | Admitting: Family Medicine

## 2022-06-23 VITALS — Ht 64.0 in | Wt 168.0 lb

## 2022-06-23 DIAGNOSIS — Z1211 Encounter for screening for malignant neoplasm of colon: Secondary | ICD-10-CM

## 2022-06-23 DIAGNOSIS — Z0000 Encounter for general adult medical examination without abnormal findings: Secondary | ICD-10-CM

## 2022-06-23 DIAGNOSIS — J40 Bronchitis, not specified as acute or chronic: Secondary | ICD-10-CM

## 2022-06-23 DIAGNOSIS — Z1231 Encounter for screening mammogram for malignant neoplasm of breast: Secondary | ICD-10-CM

## 2022-06-23 DIAGNOSIS — Z20828 Contact with and (suspected) exposure to other viral communicable diseases: Secondary | ICD-10-CM

## 2022-06-23 DIAGNOSIS — Z139 Encounter for screening, unspecified: Secondary | ICD-10-CM

## 2022-06-23 MED ORDER — VENTOLIN HFA 108 (90 BASE) MCG/ACT IN AERS: 1.0000 | INHALATION_SPRAY | Freq: Four times a day (QID) | RESPIRATORY_TRACT | 3 refills | Status: DC | PRN

## 2022-06-23 NOTE — Patient Instructions (Signed)
Erica Bartlett , Thank you for taking time to come for your Medicare Wellness Visit. I appreciate your ongoing commitment to your health goals. Please review the following plan we discussed and let me know if I can assist you in the future.   These are the goals we discussed:  Goals       "I need financial assistance right now" (pt-stated)      Current Barriers:  Financial constraints related to affording energy bill Limited social support Lacks knowledge of community resource: emergency energy bill assistance   Clinical Social Work Clinical Goal(s):  Over the next 90 days, client will work with SW to address concerns related to gaining financial support in order to promote healthy living.  Over the next 90 days, patient will work with LCSW to address needs related to gaining emergency energy bill assistance.   Interventions: Patient interviewed and appropriate assessments performed Provided patient with information about Energy Assistance Program through [place]- In an effort to keep our community safe from COVID-19, new procedures will be implemented to maintain social distancing guidelines and prevent large groups from gathering in our agency lobbies.  We will begin taking applications on Tuesday 10/29/18. To be potentially eligible for assistance, you must have a condition that would put your life or health at risk without a cooling source.  You must also be able to meet the income guidelines.   We can only assist with the amount needed to avoid a cooling crisis up to $600 per state fiscal year. --UPDATE* Patient reports being unable to utilize this resource but could not express exactly as to why (stated they wanted her to pay some of the bill but she is not able to) The following procedures are in place: Energy applications and interviews will not be conducted face-to-face. Applications can be picked up at the main entrance of the DSS building, printed from our website, taken by phone or mailed  per customer request.   Contact [phone] to place an application If picking up your application at the [facility] building, you must return to your vehicle to complete the application.  Once completed, applications can be returned to [place] by: Secured drop box located at the main entrance of Social Services Mail to [address] [place], [place] [address] Email at [email or web address] Fax at [phone] Additional information can be returned with your application such as income verification (last 4 paystubs if paid weekly, last 2 paystubs if paid biweekly) and energy bill information. Once your application is received your interview will be conducted by telephone--your application will not be considered valid until an interview has been completed and a signed application received.  You will be contacted by an Soil scientist for an interview, so please include a valid contact phone number to ensure your interview can be completed.  Once the interview has been completed, your application will be processed.Per new state policy, you do not have to be at a point of disconnection to apply for services, you only need a past due bill. To inquire if funding is available, please call our information line at [phone]. [place] who are facing a disconnection of their utility or heating service may be eligible to receive a one time cash grant from [employer]. The funds are intended to help stop or prevent a crisis by qualified individuals. Call the [place]. at [phone] to apply for energy bill  help or learn more. [employer] can provide emergency financial assistance to qualified customers who are faced with a disconnection. Funds can be used for paying electric or heating bills. While funding is limited and it usually provided as a small dollar amount,  the agency will do their best to help.  LCSW provided further education on other financial support resources within the nearby area that may be beneficial to her during this time. LCSW sent secure email with these financial support resources to patient on 12/19/2018. Patient confirms receiving these resources successfully on 01/23/2019. Resource education provided again during session today. LCSW will make a referral for [facility] for financial strain. Patient agreeable to this referral and additional resource connection.  Discussed plans with patient for ongoing care management follow up and provided patient with direct contact information for care management team Advised patient to consider going to DSS to gain Emergency Assistance Application in person. Assisted patient/caregiver with obtaining information about health plan benefits  Patient Self Care Activities:  Attends all scheduled provider appointments Calls provider office for new concerns or questions  Please see past updates related to this goal by clicking on the "Past Updates" button in the selected goal        DIET - EAT [place] Smoking      Smoking cessation discussed        This is a list of the screening recommended for you and due dates:  Health Maintenance  Topic Date Due   Hepatitis C Screening: USPSTF Recommendation to screen - Ages 89-79 yo.  Never done   DTaP/Tdap/Td vaccine (1 - Tdap) Never done   Zoster (Shingles) Vaccine (1 of 2) Never done   Pap Smear  Never done   Colon Cancer Screening  Never done   Mammogram  09/13/2018   COVID-19 Vaccine (3 - Moderna risk series) 08/02/2019   Flu Shot  07/23/2022*   HIV Screening  Completed   HPV Vaccine  Aged Out  *Topic was postponed. The date shown is not the original due date.    Advanced directives: no  Conditions/risks identified: no  Next appointment: Follow up in one year for your annual wellness  visit. 06/29/23 @ 9:15 am by phone  Preventive Care 40-64 Years, Female Preventive care refers to lifestyle choices and visits with your health care provider that can promote health and wellness. What does preventive care include? A yearly physical exam. This is also called an annual well check. Dental exams once or twice a year. Routine eye exams. Ask your health care provider how often you should have your eyes checked. Personal lifestyle choices, including: Daily care of your teeth and gums. Regular physical activity. Eating a healthy diet. Avoiding tobacco and drug use. Limiting alcohol use. Practicing safe sex. Taking low-dose aspirin daily starting at age 28. Taking vitamin and mineral supplements as recommended by your health care provider. What happens during an annual well check? The services and screenings done by your health care provider during your annual well check will depend on your age, overall health, lifestyle risk factors, and family history of disease. Counseling  Your health care provider may ask you questions about your: Alcohol use. Tobacco use. Drug use. Emotional well-being. Home and relationship well-being. Sexual activity. Eating habits. Work and work Statistician. Method of birth control. Menstrual cycle. Pregnancy history. Screening  You may have the following tests or  measurements: Height, weight, and BMI. Blood pressure. Lipid and cholesterol levels. These may be checked every 5 years, or more frequently if you are over 68 years old. Skin check. Lung cancer screening. You may have this screening every year starting at age 22 if you have a 30-pack-year history of smoking and currently smoke or have quit within the past 15 years. Fecal occult blood test (FOBT) of the stool. You may have this test every year starting at age 105. Flexible sigmoidoscopy or colonoscopy. You may have a sigmoidoscopy every 5 years or a colonoscopy every 10 years starting at  age 35. Hepatitis C blood test. Hepatitis B blood test. Sexually transmitted disease (STD) testing. Diabetes screening. This is done by checking your blood sugar (glucose) after you have not eaten for a while (fasting). You may have this done every 1-3 years. Mammogram. This may be done every 1-2 years. Talk to your health care provider about when you should start having regular mammograms. This may depend on whether you have a family history of breast cancer. BRCA-related cancer screening. This may be done if you have a family history of breast, ovarian, tubal, or peritoneal cancers. Pelvic exam and Pap test. This may be done every 3 years starting at age 33. Starting at age 53, this may be done every 5 years if you have a Pap test in combination with an HPV test. Bone density scan. This is done to screen for osteoporosis. You may have this scan if you are at high risk for osteoporosis. Discuss your test results, treatment options, and if necessary, the need for more tests with your health care provider. Vaccines  Your health care provider may recommend certain vaccines, such as: Influenza vaccine. This is recommended every year. Tetanus, diphtheria, and acellular pertussis (Tdap, Td) vaccine. You may need a Td booster every 10 years. Zoster vaccine. You may need this after age 87. Pneumococcal 13-valent conjugate (PCV13) vaccine. You may need this if you have certain conditions and were not previously vaccinated. Pneumococcal polysaccharide (PPSV23) vaccine. You may need one or two doses if you smoke cigarettes or if you have certain conditions. Talk to your health care provider about which screenings and vaccines you need and how often you need them. This information is not intended to replace advice given to you by your health care provider. Make sure you discuss any questions you have with your health care provider. Document Released: 05/07/2015 Document Revised: 12/29/2015 Document Reviewed:  02/09/2015 Elsevier Interactive Patient Education  2017 [place] Prevention in the Home Falls can cause injuries. They can happen to people of all ages. There are many things you can do to make your home safe and to help prevent falls. What can I do on the outside of my home? Regularly fix the edges of walkways and driveways and fix any cracks. Remove anything that might make you trip as you walk through a door, such as a raised step or threshold. Trim any bushes or trees on the path to your home. Use bright outdoor lighting. Clear any walking paths of anything that might make someone trip, such as rocks or tools. Regularly check to see if handrails are loose or broken. Make sure that both sides of any steps have handrails. Any raised decks and porches should have guardrails on the edges. Have any leaves, snow, or ice cleared regularly. Use sand or salt on walking paths during winter. Clean up any spills in your garage right away.  This includes oil or grease spills. What can I do in the bathroom? Use night lights. Install grab bars by the toilet and in the tub and shower. Do not use towel bars as grab bars. Use non-skid mats or decals in the tub or shower. If you need to sit down in the shower, use a plastic, non-slip stool. Keep the floor dry. Clean up any water that spills on the floor as soon as it happens. Remove soap buildup in the tub or shower regularly. Attach bath mats securely with double-sided non-slip rug tape. Do not have throw rugs and other things on the floor that can make you trip. What can I do in the bedroom? Use night lights. Make sure that you have a light by your bed that is easy to reach. Do not use any sheets or blankets that are too big for your bed. They should not hang down onto the floor. Have a firm chair that has side arms. You can use this for support while you get dressed. Do not have throw rugs and other things on the floor that can make  you trip. What can I do in the kitchen? Clean up any spills right away. Avoid walking on wet floors. Keep items that you use a lot in easy-to-reach places. If you need to reach something above you, use a strong step stool that has a grab bar. Keep electrical cords out of the way. Do not use floor polish or wax that makes floors slippery. If you must use wax, use non-skid floor wax. Do not have throw rugs and other things on the floor that can make you trip. What can I do with my stairs? Do not leave any items on the stairs. Make sure that there are handrails on both sides of the stairs and use them. Fix handrails that are broken or loose. Make sure that handrails are as long as the stairways. Check any carpeting to make sure that it is firmly attached to the stairs. Fix any carpet that is loose or worn. Avoid having throw rugs at the top or bottom of the stairs. If you do have throw rugs, attach them to the floor with carpet tape. Make sure that you have a light switch at the top of the stairs and the bottom of the stairs. If you do not have them, ask someone to add them for you. What else can I do to help prevent falls? Wear shoes that: Do not have high heels. Have rubber bottoms. Are comfortable and fit you well. Are closed at the toe. Do not wear sandals. If you use a stepladder: Make sure that it is fully opened. Do not climb a closed stepladder. Make sure that both sides of the stepladder are locked into place. Ask someone to hold it for you, if possible. Clearly mark and make sure that you can see: Any grab bars or handrails. First and last steps. Where the edge of each step is. Use tools that help you move around (mobility aids) if they are needed. These include: Canes. Walkers. Scooters. Crutches. Turn on the lights when you go into a dark area. Replace any light bulbs as soon as they burn out. Set up your furniture so you have a clear path. Avoid moving your furniture  around. If any of your floors are uneven, fix them. If there are any pets around you, be aware of where they are. Review your medicines with your doctor. Some medicines can make you feel  dizzy. This can increase your chance of falling. Ask your doctor what other things that you can do to help prevent falls. This information is not intended to replace advice given to you by your health care provider. Make sure you discuss any questions you have with your health care provider. Document Released: 02/04/2009 Document Revised: 09/16/2015 Document Reviewed: 05/15/2014 Elsevier Interactive Patient Education  2017 [employer].

## 2022-06-23 NOTE — Progress Notes (Signed)
I connected with  Erica Bartlett on 06/23/22 by a audio enabled telemedicine application and verified that I am speaking with the correct person using two identifiers.  Patient Location: Home  Provider Location: Office/Clinic  I discussed the limitations of evaluation and management by telemedicine. The patient expressed understanding and agreed to proceed.  Subjective:   Erica Bartlett is a 67 y.o. female who presents for Medicare Annual (Subsequent) preventive examination.  Review of Systems     Cardiac Risk Factors include: hypertension     Objective:    Today's Vitals   06/23/22 0926  PainSc: 5    There is no height or weight on file to calculate BMI.     06/23/2022    9:33 AM 04/21/2022    8:44 AM 04/14/2022    9:17 AM 04/07/2022    8:39 AM 03/31/2022    9:00 AM 03/31/2022    8:44 AM 02/21/2022   10:32 AM  Advanced Directives  Does Patient Have a Medical Advance Directive? No Yes Yes Yes Yes Yes No  Type of Corporate treasurer of [place];Living will [place];Living will   [place];Living will   Does patient want to make changes to medical advance directive?   No - Patient declined  No - Guardian declined    Copy of [place] in Chart?   No - copy requested      Would patient like information on creating a medical advance directive? No - Patient declined          Current Medications (verified) Outpatient Encounter Medications as of 06/23/2022  Medication Sig   acetaminophen (TYLENOL) 500 MG tablet Take 500 mg by mouth every 6 (six) hours as needed.   amLODipine (NORVASC) 10 MG tablet Take 1 tablet (10 mg total) by mouth daily.   baclofen (LIORESAL) 10 MG tablet Take 0.5 tablets (5 mg total) by mouth 2 (two) times daily.   benzonatate (TESSALON) 100 MG capsule Take 1 capsule (100 mg total) by mouth 3 (three) times daily as needed for cough.   brompheniramine-pseudoephedrine-DM 30-2-10  MG/5ML syrup Take 5 mLs by mouth 4 (four) times daily as needed.   clonazePAM (KLONOPIN) 1 MG tablet Take 1-2 mg by mouth 3 (three) times daily as needed. Reported on 10/11/2015   cyclobenzaprine (FLEXERIL) 10 MG tablet Take 1 tablet (10 mg total) by mouth 3 (three) times daily as needed for muscle spasms.   diphenhydrAMINE (BENADRYL) 25 mg capsule Take 25 mg by mouth as needed.   fluticasone (FLONASE) 50 MCG/ACT nasal spray Place 2 sprays into both nostrils daily.   hydrochlorothiazide (HYDRODIURIL) 25 MG tablet TAKE 1 TABLET (25 MG TOTAL) BY MOUTH DAILY.   ketotifen (ZADITOR) 0.025 % ophthalmic solution Place 1 drop into both eyes 2 (two) times daily.   KLOR-CON M20 20 MEQ tablet TAKE 1 TABLET BY MOUTH EVERY DAY   levofloxacin (LEVAQUIN) 500 MG tablet Take 1 tablet (500 mg total) by mouth daily. For 7 days (Patient not taking: Reported on 06/23/2022)   ondansetron (ZOFRAN) 4 MG tablet Take 1 tablet (4 mg total) by mouth every 8 (eight) hours as needed for nausea or vomiting.   pantoprazole (PROTONIX) 40 MG tablet Take 1 tablet (40 mg total) by mouth daily.   VENTOLIN HFA 108 (90 Base) MCG/ACT inhaler INHALE 1-2 PUFFS INTO THE LUNGS EVERY 6 (SIX) HOURS AS NEEDED FOR WHEEZING OR SHORTNESS OF BREATH.   cetirizine (ZYRTEC) 10 MG  tablet Take 1 tablet (10 mg total) by mouth daily. (Patient not taking: Reported on 06/23/2022)   citalopram (CELEXA) 40 MG tablet Take 40 mg by mouth daily. (Patient not taking: Reported on 06/23/2022)   oseltamivir (TAMIFLU) 75 MG capsule Take 1 capsule (75 mg total) by mouth daily. For 10 days. If develop symptoms change dosing to twice daily to finish course (Patient not taking: Reported on 06/23/2022)   predniSONE (DELTASONE) 20 MG tablet Take daily with food. Start with '60mg'$  (3 pills) x 2 days, then reduce to '40mg'$  (2 pills) x 2 days, then '20mg'$  (1 pill) x 3 days (Patient not taking: Reported on 06/23/2022)   prochlorperazine (COMPAZINE) 10 MG tablet Take 1 tablet (10 mg total) by  mouth every 6 (six) hours as needed for nausea or vomiting. (Patient not taking: Reported on 06/23/2022)   No facility-administered encounter medications on file as of 06/23/2022.    Allergies (verified) Doxycycline and Penicillins   History: Past Medical History:  Diagnosis Date   Anxiety    Cancer ([place])    Hypertension    Past Surgical History:  Procedure Laterality Date   ABDOMINAL HYSTERECTOMY     BREAST BIOPSY Left    age 76 surgical bx neg   CESAREAN SECTION     cyst removed     breast, benign   Family History  Problem Relation Age of Onset   Diabetes Mother    Heart failure Father    Kidney failure Father    Social History   Socioeconomic History   Marital status: Widowed    Spouse name: Not on file   Number of children: Not on file   Years of education: Not on file   Highest education level: Not on file  Occupational History    Comment: part time  Tobacco Use   Smoking status: Every Day    Packs/day: 0.15    Types: Cigarettes   Smokeless tobacco: Never   Tobacco comments:    1 PACK A WEEK  Vaping Use   Vaping Use: Never used  Substance and Sexual Activity   Alcohol use: No   Drug use: No   Sexual activity: Yes    Birth control/protection: Post-menopausal  Other Topics Concern   Not on file  Social History Narrative   Not on file   Social Determinants of Health   Financial Resource Strain: Medium Risk (06/23/2022)   Overall Financial Resource Strain (CARDIA)    Difficulty of Paying Living Expenses: Somewhat hard  Food Insecurity: No Food Insecurity (06/23/2022)   Hunger Vital Sign    Worried About Running Out of Food in the Last Year: Never true    Ran Out of Food in the Last Year: Never true  Transportation Needs: No Transportation Needs (06/23/2022)   PRAPARE - Hydrologist (Medical): No    Lack of Transportation (Non-Medical): No  Physical Activity: Inactive (06/23/2022)   Exercise Vital Sign    Days of Exercise per  Week: 0 days    Minutes of Exercise per Session: 0 min  Stress: No Stress Concern Present (06/23/2022)   [place]    Feeling of Stress : Only a little  Recent Concern: Stress - Stress Concern Present (04/07/2022)   [place]    Feeling of Stress : Very much  Social Connections: Moderately Isolated (04/07/2022)   Social Connection and Isolation Panel [NHANES]  Frequency of Communication with Friends and Family: More than three times a week    Frequency of Social Gatherings with Friends and Family: More than three times a week    Attends Religious Services: 1 to 4 times per year    Active Member of [employer] or Organizations: No    Attends Archivist Meetings: Never    Marital Status: Widowed    Tobacco Counseling Ready to quit: Not Answered Counseling given: Not Answered Tobacco comments: 1 PACK A WEEK   Clinical Intake:  Pre-visit preparation completed: Yes  Pain : 0-10 Pain Score: 5  Pain Type: Chronic pain Pain Location: Back     Nutritional Risks: None Diabetes: No  How often do you need to have someone help you when you read instructions, pamphlets, or other written materials from your doctor or pharmacy?: 1 - Never  Diabetic?no  Interpreter Needed?: No  Information entered by :: Kirke Shaggy LPN   Activities of Daily Living    06/23/2022    9:34 AM  In your present state of health, do you have any difficulty performing the following activities:  Hearing? 0  Vision? 0  Difficulty concentrating or making decisions? 0  Walking or climbing stairs? 1  Dressing or bathing? 0  Doing errands, shopping? 0  Preparing Food and eating ? N  Using the Toilet? N  In the past six months, have you accidently leaked urine? N  Do you have problems with loss of bowel control? N  Managing your Medications? N  Managing your Finances?  N  Housekeeping or managing your Housekeeping? N    Patient Care Team: Olin Hauser, DO as PCP - General (Family Medicine) Chucky May, MD as Consulting Physician (Psychiatry) Lloyd Huger, MD as Consulting Physician (Oncology) Greg Cutter, LCSW as Social Worker (Licensed Clinical Social Worker)  Indicate any recent [employer] you may have received from other than [facility] providers in the past year (date may be approximate).     Assessment:   This is a routine wellness examination for Alira.  Hearing/Vision screen Hearing Screening - Comments:: No aids Vision Screening - Comments:: Wears glasses- Dr.Bell  Dietary issues and exercise activities discussed: Current Exercise Habits: The patient does not participate in regular exercise at present   Goals Addressed             This Visit's Progress    DIET - INCREASE WATER INTAKE         Depression Screen    06/23/2022    9:31 AM 04/07/2022   12:49 PM 03/30/2022   12:13 PM 08/10/2021    2:43 PM 06/17/2021   10:26 AM 08/19/2020   11:04 AM 06/17/2019    1:30 PM  PHQ 2/9 Scores  PHQ - 2 Score '1 3 2 '$ 0 1 0 0  PHQ- 9 Score '3 9 4 9       '$ Fall Risk    06/23/2022    9:33 AM 03/30/2022   12:13 PM 08/10/2021    2:43 PM 06/17/2021   10:29 AM 08/19/2020   11:04 AM  Fall Risk   Falls in the past year? 0 0 0 0 0  Number falls in past yr: 0 0 0 0 0  Injury with Fall? 0 0 0 0 0  Risk for fall due to : No Fall Risks No Fall Risks No Fall Risks No Fall Risks   Follow up Falls prevention discussed;Falls evaluation completed Falls evaluation completed Falls evaluation completed  Falls evaluation completed Falls evaluation completed    FALL RISK PREVENTION PERTAINING TO THE HOME:  Any stairs in or around the home? Yes  If so, are there any without handrails? No  Home free of loose throw rugs in walkways, pet beds, electrical cords, etc? Yes  Adequate lighting in your home to reduce risk of falls? Yes    ASSISTIVE DEVICES UTILIZED TO PREVENT FALLS:  Life alert? No  Use of a cane, walker or w/c? No  Grab bars in the bathroom? Yes  Shower chair or bench in shower? Yes  Elevated toilet seat or a handicapped toilet? Yes    Cognitive Function:        09/18/2017    8:43 AM  6CIT Screen  What Year? 0 points  What month? 0 points  What time? 0 points  Count back from 20 0 points  Months in reverse 0 points  Repeat phrase 0 points  Total Score 0 points    Immunizations Immunization History  Administered Date(s) Administered   Influenza,inj,Quad PF,6+ Mos 02/18/2015, 01/31/2019   Moderna Sars-Covid-2 Vaccination 06/09/2019, 07/05/2019    TDAP status: Due, Education has been provided regarding the importance of this vaccine. Advised may receive this vaccine at local pharmacy or Health Dept. Aware to provide a copy of the vaccination record if obtained from local pharmacy or Health Dept. Verbalized acceptance and understanding.  Flu Vaccine status: Declined, Education has been provided regarding the importance of this vaccine but patient still declined. Advised may receive this vaccine at local pharmacy or Health Dept. Aware to provide a copy of the vaccination record if obtained from local pharmacy or Health Dept. Verbalized acceptance and understanding.  Pneumococcal vaccine status: Declined,  Education has been provided regarding the importance of this vaccine but patient still declined. Advised may receive this vaccine at local pharmacy or Health Dept. Aware to provide a copy of the vaccination record if obtained from local pharmacy or Health Dept. Verbalized acceptance and understanding.   Covid-19 vaccine status: Completed vaccines  Qualifies for Shingles Vaccine? Yes   Zostavax completed No   Shingrix Completed?: No.    Education has been provided regarding the importance of this vaccine. Patient has been advised to call insurance company to determine out of pocket expense if  they have not yet received this vaccine. Advised may also receive vaccine at local pharmacy or Health Dept. Verbalized acceptance and understanding.  Screening Tests Health Maintenance  Topic Date Due   Hepatitis C Screening  Never done   DTaP/Tdap/Td (1 - Tdap) Never done   Zoster Vaccines- Shingrix (1 of 2) Never done   PAP SMEAR-Modifier  Never done   COLONOSCOPY (Pts 45-23yr Insurance coverage will need to be confirmed)  Never done   MAMMOGRAM  09/13/2018   COVID-19 Vaccine (3 - Moderna risk series) 08/02/2019   INFLUENZA VACCINE  07/23/2022 (Originally 11/22/2021)   HIV Screening  Completed   HPV VACCINES  Aged Out    Health Maintenance  Health Maintenance Due  Topic Date Due   Hepatitis C Screening  Never done   DTaP/Tdap/Td (1 - Tdap) Never done   Zoster Vaccines- Shingrix (1 of 2) Never done   PAP SMEAR-Modifier  Never done   COLONOSCOPY (Pts 45-490yrInsurance coverage will need to be confirmed)  Never done   MAMMOGRAM  09/13/2018   COVID-19 Vaccine (3 - Moderna risk series) 08/02/2019    Colorectal cancer screening: Referral to GI placed 06/23/22. Pt aware the office will  call re: appt.  Mammogram status: Ordered 06/23/22. Pt provided with contact info and advised to call to schedule appt.     Lung Cancer Screening: (Low Dose CT Chest recommended if Age 39-80 years, 30 pack-year currently smoking OR have quit w/in 15years.) does not qualify.   Additional Screening:  Hepatitis C Screening: does qualify; Completed no  Vision Screening: Recommended annual ophthalmology exams for early detection of glaucoma and other disorders of the eye. Is the patient up to date with their annual eye exam?  Yes  Who is the provider or what is the name of the office in which the patient attends annual eye exams? Dr.Bell If pt is not established with a provider, would they like to be referred to a provider to establish care? No .   Dental Screening: Recommended annual dental exams for  proper oral hygiene  Community Resource Referral / Chronic Care Management: CRR required this visit?  Yes   CCM required this visit?  No      Plan:     I have personally reviewed and noted the following in the patient's chart:   Medical and social history Use of alcohol, tobacco or illicit drugs  Current medications and supplements including opioid prescriptions. Patient is not currently taking opioid prescriptions. Functional ability and status Nutritional status Physical activity Advanced directives List of other physicians Hospitalizations, surgeries, and ER visits in previous 12 months Vitals Screenings to include cognitive, depression, and falls Referrals and appointments  In addition, I have reviewed and discussed with patient certain preventive protocols, quality metrics, and best practice recommendations. A written personalized care plan for preventive services as well as general preventive health recommendations were provided to patient.     Dionisio David, LPN   [id number]   Nurse Notes: none

## 2022-06-24 ENCOUNTER — Other Ambulatory Visit: Payer: Self-pay

## 2022-06-26 ENCOUNTER — Telehealth: Payer: Self-pay

## 2022-06-26 NOTE — Telephone Encounter (Signed)
   Telephone encounter was:  Unsuccessful.  06/26/2022 Name: DELANEE TORNERO MRN: [id number] DOB: 09-05-1955  Unsuccessful outbound call made today to assist with:   utilities  Outreach Attempt:  1st Attempt  A HIPAA compliant voice message was left requesting a return call.  Instructed patient to call back     [facility] [phone] [address], [place], [place] [address] Phone: [phone] Email: [email or web address]

## 2022-06-27 ENCOUNTER — Encounter: Payer: Self-pay | Admitting: Oncology

## 2022-06-28 ENCOUNTER — Telehealth: Payer: Self-pay

## 2022-06-28 NOTE — Telephone Encounter (Signed)
   Telephone encounter was:  Successful.  06/28/2022 Name: Erica Bartlett MRN: [id number] DOB: May 24, 1955  Erica Bartlett is a 67 y.o. year old female who is a primary care patient of Olin Hauser, DO . The community resource team was consulted for assistance with Financial Difficulties related to [place] guide performed the following interventions: Patient provided with information about care guide support team and interviewed to confirm resource needs.Patient needs Resources for Utility Assistance. Patient requested I email her the resources to the email on file for [facility]   Follow Up Plan:  No further follow up planned at this time. The patient has been provided with needed resources.    [facility] [phone] [address], [place], [place] [address] Phone: [phone] Email: [email or web address]

## 2022-06-29 ENCOUNTER — Encounter: Payer: Self-pay | Admitting: Oncology

## 2022-07-21 ENCOUNTER — Ambulatory Visit: Payer: Medicaid Other | Admitting: Family Medicine

## 2022-07-21 ENCOUNTER — Encounter: Payer: Self-pay | Admitting: Family Medicine

## 2022-07-21 ENCOUNTER — Inpatient Hospital Stay (HOSPITAL_BASED_OUTPATIENT_CLINIC_OR_DEPARTMENT_OTHER): Payer: Medicare Other | Admitting: Oncology

## 2022-07-21 ENCOUNTER — Inpatient Hospital Stay: Payer: Medicare Other | Attending: Oncology

## 2022-07-21 ENCOUNTER — Encounter: Payer: Self-pay | Admitting: Oncology

## 2022-07-21 VITALS — BP 132/88 | HR 72 | Ht 64.0 in | Wt 171.0 lb

## 2022-07-21 VITALS — BP 119/74 | HR 66 | Temp 97.2°F | Resp 16 | Ht 64.0 in | Wt 171.5 lb

## 2022-07-21 DIAGNOSIS — M25512 Pain in left shoulder: Secondary | ICD-10-CM | POA: Diagnosis not present

## 2022-07-21 DIAGNOSIS — J3089 Other allergic rhinitis: Secondary | ICD-10-CM

## 2022-07-21 DIAGNOSIS — Z7952 Long term (current) use of systemic steroids: Secondary | ICD-10-CM | POA: Diagnosis not present

## 2022-07-21 DIAGNOSIS — F17210 Nicotine dependence, cigarettes, uncomplicated: Secondary | ICD-10-CM | POA: Diagnosis not present

## 2022-07-21 DIAGNOSIS — M791 Myalgia, unspecified site: Secondary | ICD-10-CM | POA: Insufficient documentation

## 2022-07-21 DIAGNOSIS — I10 Essential (primary) hypertension: Secondary | ICD-10-CM | POA: Insufficient documentation

## 2022-07-21 DIAGNOSIS — D693 Immune thrombocytopenic purpura: Secondary | ICD-10-CM | POA: Insufficient documentation

## 2022-07-21 DIAGNOSIS — G8929 Other chronic pain: Secondary | ICD-10-CM

## 2022-07-21 DIAGNOSIS — J320 Chronic maxillary sinusitis: Secondary | ICD-10-CM | POA: Diagnosis not present

## 2022-07-21 DIAGNOSIS — R110 Nausea: Secondary | ICD-10-CM | POA: Insufficient documentation

## 2022-07-21 DIAGNOSIS — M7552 Bursitis of left shoulder: Secondary | ICD-10-CM | POA: Diagnosis not present

## 2022-07-21 DIAGNOSIS — Z79899 Other long term (current) drug therapy: Secondary | ICD-10-CM | POA: Insufficient documentation

## 2022-07-21 LAB — CBC WITH DIFFERENTIAL/PLATELET
Abs Immature Granulocytes: 0.02 K/uL (ref 0.00–0.07)
Basophils Absolute: 0 K/uL (ref 0.0–0.1)
Basophils Relative: 0 %
Eosinophils Absolute: 0.2 K/uL (ref 0.0–0.5)
Eosinophils Relative: 3 %
HCT: 41.4 % (ref 36.0–46.0)
Hemoglobin: 13.7 g/dL (ref 12.0–15.0)
Immature Granulocytes: 0 %
Lymphocytes Relative: 23 %
Lymphs Abs: 1.1 K/uL (ref 0.7–4.0)
MCH: 28.2 pg (ref 26.0–34.0)
MCHC: 33.1 g/dL (ref 30.0–36.0)
MCV: 85.4 fL (ref 80.0–100.0)
Monocytes Absolute: 0.4 K/uL (ref 0.1–1.0)
Monocytes Relative: 8 %
Neutro Abs: 3.1 K/uL (ref 1.7–7.7)
Neutrophils Relative %: 66 %
Platelets: 221 K/uL (ref 150–400)
RBC: 4.85 MIL/uL (ref 3.87–5.11)
RDW: 14.4 % (ref 11.5–15.5)
WBC: 4.8 K/uL (ref 4.0–10.5)
nRBC: 0 % (ref 0.0–0.2)

## 2022-07-21 MED ORDER — PREDNISONE 20 MG PO TABS: ORAL_TABLET | ORAL | 0 refills | Status: DC

## 2022-07-21 MED ORDER — FLUTICASONE PROPIONATE 50 MCG/ACT NA SUSP: 2.0000 | Freq: Every day | NASAL | 11 refills | Status: AC

## 2022-07-21 MED ORDER — LORATADINE 10 MG PO TABS: 10.0000 mg | ORAL_TABLET | Freq: Every day | ORAL | 3 refills | Status: DC

## 2022-07-21 MED ORDER — BENZONATATE 100 MG PO CAPS: 100.0000 mg | ORAL_CAPSULE | Freq: Three times a day (TID) | ORAL | 0 refills | Status: DC | PRN
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
## 2022-07-21 NOTE — Patient Instructions (Addendum)
Thank you for coming to the office today.  Medications ordered  Steroid, nasal spray, allergy pill  If by next week not improving with sinuses, call or message we can add the antibiotic if needed  Hopefully Ortho can work with you now on the L Shoulder  Maybe repeat injection or other treatment.   [facility] [address], [place]  [address] Phone: [phone]  Please schedule a Follow-up Appointment to: Return if symptoms worsen or fail to improve.  If you have any other questions or concerns, please feel free to call the office or send a message through [facility]. You may also schedule an earlier appointment if necessary.  Additionally, you may be receiving a survey about your experience at our office within a few days to 1 week by e-mail or mail. We value your feedback.  [name], DO [place]

## 2022-08-15 ENCOUNTER — Encounter: Payer: Self-pay | Admitting: Oncology

## 2022-08-27 ENCOUNTER — Other Ambulatory Visit: Payer: Self-pay | Admitting: Family Medicine

## 2022-08-27 DIAGNOSIS — I10 Essential (primary) hypertension: Secondary | ICD-10-CM

## 2022-08-28 NOTE — Telephone Encounter (Signed)
Requested Prescriptions  Pending Prescriptions Disp Refills   hydrochlorothiazide (HYDRODIURIL) 25 MG tablet [Pharmacy Med Name: HYDROCHLOROTHIAZIDE 25 MG TAB] 90 tablet 1    Sig: TAKE 1 TABLET (25 MG TOTAL) BY MOUTH DAILY.     Cardiovascular: Diuretics - Thiazide Failed - 08/27/2022  9:45 AM      Failed - K in normal range and within 180 days    Potassium  Date Value Ref Range Status  04/21/2022 3.0 (L) 3.5 - 5.1 mmol/L Final         Passed - Cr in normal range and within 180 days    Creat  Date Value Ref Range Status  08/17/2016 0.88 0.50 - 1.05 mg/dL Final    Comment:      For patients > or = 65 years of age: The upper reference limit for Creatinine is approximately 13% higher for people identified as African-American.      Creatinine, Ser  Date Value Ref Range Status  04/21/2022 0.97 0.44 - 1.00 mg/dL Final         Passed - Na in normal range and within 180 days    Sodium  Date Value Ref Range Status  04/21/2022 140 135 - 145 mmol/L Final  08/04/2015 140 134 - 144 mmol/L Final         Passed - Last BP in normal range    BP Readings from Last 1 Encounters:  07/21/22 132/88         Passed - Valid encounter within last 6 months    Recent Outpatient Visits           1 month ago Chronic left shoulder pain   [facility] Smitty Cords, DO   2 months ago Acute viral syndrome   [place] [facility] [facility] [place], [name], DO   5 months ago Chronic left shoulder pain   [place] [place], [name], DO   5 months ago Influenza   [facility] Smitty Cords, DO   5 months ago Chronic left shoulder pain   [place] [facility] [place], [name], [place]

## 2022-09-05 ENCOUNTER — Ambulatory Visit: Payer: Medicare Other | Admitting: Podiatry

## 2022-09-12 ENCOUNTER — Ambulatory Visit: Payer: Medicaid Other | Admitting: Podiatry

## 2022-09-12 ENCOUNTER — Encounter: Payer: Self-pay | Admitting: Podiatry

## 2022-09-12 VITALS — BP 126/75 | HR 57

## 2022-09-12 DIAGNOSIS — B070 Plantar wart: Secondary | ICD-10-CM

## 2022-09-12 NOTE — Progress Notes (Signed)
   Chief Complaint  Patient presents with   Callouses    "Do the same thing, those places are back."    Subjective: 67 y.o. 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  Patient last seen in the office on 08/26/2021.  She says over the past year the lesions have returned.    Past Medical History:  Diagnosis Date   Anxiety    Cancer ([facility])    Hypertension     Objective: Physical Exam General: The patient is alert and oriented x3 in no acute distress.   Dermatology: There continues to be multiple hyperkeratotic skin lesions noted to the plantar aspect of the bilateral feet approximately 1 cm in diameter. Pinpoint bleeding noted upon debridement. Skin is warm, dry and supple bilateral lower extremities. Negative for open lesions or macerations.   Vascular: Palpable pedal pulses bilaterally. No edema or erythema noted. Capillary refill within normal limits.   Neurological: Epicritic and protective threshold grossly intact bilaterally.    Musculoskeletal Exam:  No pedal deformities noted   Assessment: 1. plantar wart bilateral feet    Plan of Care:  1. Patient was evaluated. 2. Excisional debridement of the plantar wart lesion(s) was performed using a chisel blade. Cantharone was applied and the lesion(s) was dressed with a dry sterile dressing. 3.  Patient is to return to clinic in 2 weeks  *Patient states that her sons have PROM in about 2 weeks.  [name], DPM [facility]  Dr. [name], DPM    [address] [place], [place] [address]                Office [phone]  Fax [phone]

## 2022-09-26 ENCOUNTER — Ambulatory Visit: Payer: Medicare Other | Admitting: Podiatry

## 2022-10-06 ENCOUNTER — Encounter: Payer: Self-pay | Admitting: Oncology

## 2022-10-07 ENCOUNTER — Other Ambulatory Visit: Payer: Self-pay | Admitting: Family Medicine

## 2022-10-07 DIAGNOSIS — I10 Essential (primary) hypertension: Secondary | ICD-10-CM

## 2022-10-09 NOTE — Telephone Encounter (Signed)
Requested Prescriptions  Pending Prescriptions Disp Refills   amLODipine (NORVASC) 10 MG tablet [Pharmacy Med Name: AMLODIPINE BESYLATE 10 MG TAB] 90 tablet 1    Sig: TAKE 1 TABLET BY MOUTH EVERY DAY     Cardiovascular: Calcium Channel Blockers 2 Passed - 10/07/2022  1:14 PM      Passed - Last BP in normal range    BP Readings from Last 1 Encounters:  09/12/22 126/75         Passed - Last Heart Rate in normal range    Pulse Readings from Last 1 Encounters:  09/12/22 (!) 57         Passed - Valid encounter within last 6 months    Recent Outpatient Visits           2 months ago Chronic left shoulder pain   [facility] [place], [name], DO   4 months ago Acute viral syndrome   [place] [facility] Smitty Cords, DO   6 months ago Chronic left shoulder pain   [place] [facility] [place], [name], DO   6 months ago Influenza   [facility] Smitty Cords, DO   7 months ago Chronic left shoulder pain    [facility], [name], [place]

## 2022-10-10 ENCOUNTER — Encounter: Payer: Self-pay | Admitting: Oncology

## 2022-10-11 ENCOUNTER — Encounter: Payer: Self-pay | Admitting: Oncology

## 2022-10-13 ENCOUNTER — Ambulatory Visit (INDEPENDENT_AMBULATORY_CARE_PROVIDER_SITE_OTHER): Payer: Medicare Other | Admitting: Internal Medicine

## 2022-10-13 ENCOUNTER — Encounter: Payer: Self-pay | Admitting: Internal Medicine

## 2022-10-13 DIAGNOSIS — M25512 Pain in left shoulder: Secondary | ICD-10-CM | POA: Diagnosis not present

## 2022-10-13 DIAGNOSIS — G8929 Other chronic pain: Secondary | ICD-10-CM

## 2022-10-13 MED ORDER — PREDNISONE 10 MG PO TABS: ORAL_TABLET | ORAL | 0 refills | Status: DC

## 2022-10-13 MED ORDER — CYCLOBENZAPRINE HCL 10 MG PO TABS: 10.0000 mg | ORAL_TABLET | Freq: Three times a day (TID) | ORAL | 0 refills | Status: DC | PRN
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
## 2022-10-13 NOTE — Patient Instructions (Signed)

## 2022-10-16 ENCOUNTER — Encounter: Payer: Self-pay | Admitting: *Deleted

## 2022-10-20 ENCOUNTER — Inpatient Hospital Stay: Payer: Medicare Other | Attending: Oncology

## 2022-10-20 DIAGNOSIS — D693 Immune thrombocytopenic purpura: Secondary | ICD-10-CM | POA: Insufficient documentation

## 2022-10-20 LAB — CBC WITH DIFFERENTIAL/PLATELET
Abs Immature Granulocytes: 0.03 10*3/uL (ref 0.00–0.07)
Basophils Absolute: 0 10*3/uL (ref 0.0–0.1)
Basophils Relative: 0 %
Eosinophils Absolute: 0 10*3/uL (ref 0.0–0.5)
Eosinophils Relative: 0 %
HCT: 39.4 % (ref 36.0–46.0)
Hemoglobin: 13.4 g/dL (ref 12.0–15.0)
Immature Granulocytes: 0 %
Lymphocytes Relative: 21 %
Lymphs Abs: 1.6 10*3/uL (ref 0.7–4.0)
MCH: 28.4 pg (ref 26.0–34.0)
MCHC: 34 g/dL (ref 30.0–36.0)
MCV: 83.5 fL (ref 80.0–100.0)
Monocytes Absolute: 0.5 10*3/uL (ref 0.1–1.0)
Monocytes Relative: 7 %
Neutro Abs: 5.4 10*3/uL (ref 1.7–7.7)
Neutrophils Relative %: 72 %
Platelets: 259 10*3/uL (ref 150–400)
RBC: 4.72 MIL/uL (ref 3.87–5.11)
RDW: 14.4 % (ref 11.5–15.5)
WBC: 7.6 10*3/uL (ref 4.0–10.5)
nRBC: 0 % (ref 0.0–0.2)

## 2022-10-22 ENCOUNTER — Encounter: Payer: Self-pay | Admitting: Oncology

## 2022-10-23 ENCOUNTER — Ambulatory Visit: Payer: Medicare Other | Admitting: Podiatry

## 2022-11-07 ENCOUNTER — Ambulatory Visit: Payer: Medicare Other | Admitting: Podiatry

## 2022-11-22 ENCOUNTER — Encounter: Payer: Self-pay | Admitting: Family Medicine

## 2022-11-22 ENCOUNTER — Ambulatory Visit: Payer: Medicare Other | Admitting: Family Medicine

## 2022-11-22 VITALS — BP 110/68 | HR 76 | Temp 96.7°F | Wt 168.0 lb

## 2022-11-22 DIAGNOSIS — H00021 Hordeolum internum right upper eyelid: Secondary | ICD-10-CM

## 2022-11-22 DIAGNOSIS — H0011 Chalazion right upper eyelid: Secondary | ICD-10-CM | POA: Diagnosis not present

## 2022-11-22 MED ORDER — POLYMYXIN B-TRIMETHOPRIM 10000-0.1 UNIT/ML-% OP SOLN
1.0000 [drp] | Freq: Four times a day (QID) | OPHTHALMIC | 0 refills | Status: DC
Start: 2022-11-22 — End: 2023-08-16

## 2022-11-22 NOTE — Progress Notes (Signed)
Subjective:    Patient ID: Erica Bartlett, female    DOB: [birth year], 67 y.o.   MRN: [id number]  Erica Bartlett is a 67 y.o. 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 Seems to still have similar spot now recently swollen nodular density R upper eyelid. Drained some using warm compress. Has had antibiotics oral and topical in the past. Limited results. Has seen [facility] but no further treatment. Requesting further management      07/21/2022    2:27 PM 06/23/2022    9:31 AM 04/07/2022   12:49 PM  Depression screen PHQ 2/9  Decreased Interest 1 0 1  Down, Depressed, Hopeless 1 1 2   PHQ - 2 Score 2 1 3   Altered sleeping 3 1 1   Tired, decreased energy 3 1 3   Change in appetite 2 0 0  Feeling bad or failure about yourself  0 0 1  Trouble concentrating 1 0 1  Moving slowly or fidgety/restless 1 0 0  Suicidal thoughts 0 0 0  PHQ-9 Score 12 3 9   Difficult doing work/chores Not difficult at all Not difficult at all Somewhat difficult    Social History   Tobacco Use   Smoking status: Every Day    Current packs/day: 0.15    Types: Cigarettes   Smokeless tobacco: Never   Tobacco comments:    1 PACK A WEEK  Vaping Use   Vaping status: Never Used  Substance Use Topics   Alcohol use: No   Drug use: No    Review of Systems Per HPI unless specifically indicated above     Objective:    BP 110/68 (BP Location: Right Arm, Patient Position: Sitting, Cuff Size: Normal)   Pulse 76   Temp (!) 96.7 F (35.9 C) (Temporal)   Wt 168 lb (76.2 kg)   SpO2 99%   BMI 28.84 kg/m   Wt Readings from Last 3 Encounters:  11/22/22 168 lb (76.2 kg)  10/13/22 168 lb (76.2 kg)  07/21/22 171 lb (77.6 kg)    Physical Exam Vitals and nursing note reviewed.  Constitutional:      General:  She is not in acute distress.    Appearance: Normal appearance. She is well-developed. She is not diaphoretic.     Comments: Well-appearing, comfortable, cooperative  HENT:     Head: Normocephalic and atraumatic.  Eyes:     General:        Right eye: No discharge.        Left eye: No discharge.     Conjunctiva/sclera: Conjunctivae normal.     Comments: R upper eyelid inner aspect larger 1 x 1 cm chalazion vs stye  Cardiovascular:     Rate and Rhythm: Normal rate.  Pulmonary:     Effort: Pulmonary effort is normal.  Skin:    General: Skin is warm and dry.     Findings: No erythema or rash.  Neurological:     Mental Status: She is alert and oriented to person, place, and time.  Psychiatric:        Mood and Affect: Mood normal.        Behavior: Behavior normal.        Thought Content: Thought content normal.     Comments: Well groomed, good eye contact, normal speech and  thoughts    Results for orders placed or performed in visit on 10/20/22  CBC with Differential  Result Value Ref Range   WBC 7.6 4.0 - 10.5 K/uL   RBC 4.72 3.87 - 5.11 MIL/uL   Hemoglobin 13.4 12.0 - 15.0 g/dL   HCT 04.5 40.9 - 81.1 %   MCV 83.5 80.0 - 100.0 fL   MCH 28.4 26.0 - 34.0 pg   MCHC 34.0 30.0 - 36.0 g/dL   RDW 91.4 78.2 - 95.6 %   Platelets 259 150 - 400 K/uL   nRBC 0.0 0.0 - 0.2 %   Neutrophils Relative % 72 %   Neutro Abs 5.4 1.7 - 7.7 K/uL   Lymphocytes Relative 21 %   Lymphs Abs 1.6 0.7 - 4.0 K/uL   Monocytes Relative 7 %   Monocytes Absolute 0.5 0.1 - 1.0 K/uL   Eosinophils Relative 0 %   Eosinophils Absolute 0.0 0.0 - 0.5 K/uL   Basophils Relative 0 %   Basophils Absolute 0.0 0.0 - 0.1 K/uL   Immature Granulocytes 0 %   Abs Immature Granulocytes 0.03 0.00 - 0.07 K/uL      Assessment & Plan:   Problem List Items Addressed This Visit   None Visit Diagnoses     Chalazion of right upper eyelid    -  Primary   Relevant Medications   trimethoprim-polymyxin b (POLYTRIM) ophthalmic  solution   Other Relevant Orders   Ambulatory referral to Ophthalmology   Hordeolum internum of right upper eyelid       Relevant Medications   trimethoprim-polymyxin b (POLYTRIM) ophthalmic solution   Other Relevant Orders   Ambulatory referral to Ophthalmology      Orders Placed This Encounter  Procedures   Ambulatory referral to Ophthalmology    Referral Priority:   Routine    Referral Type:   Consultation    Referral Reason:   Specialty Services Required    Requested Specialty:   Ophthalmology    Number of Visits Requested:   1    Referral to Ophthalmologist to treat the stye or "chalazion" which is a harder longer lasting type of stye that may not go away on its own  Rx antibiotic eye drops to try for now and keep doing warm compresses  [facility] [address], [place], [place] [address] Phone: [phone] [email or web address]   Meds ordered this encounter  Medications   trimethoprim-polymyxin b (POLYTRIM) ophthalmic solution    Sig: Place 1 drop into the right eye in the morning, at noon, in the evening, and at bedtime. For 1-2 weeks or until resolved.    Dispense:  10 mL    Refill:  0      Follow up plan: Return if symptoms worsen or fail to improve.    Erica Pilar, DO [facility] 11/22/2022, 11:07 AM

## 2022-11-22 NOTE — Patient Instructions (Addendum)
Thank you for coming to the office today.  Referral to Ophthalmologist to treat the stye or "chalazion" which is a harder longer lasting type of stye that may not go away on its own  Rx antibiotic eye drops to try for now and keep doing warm compresses  [facility] [address], [place], [place] [address] Phone: [phone] [email or web address]  Please schedule a Follow-up Appointment to: Return if symptoms worsen or fail to improve.  If you have any other questions or concerns, please feel free to call the office or send a message through MyChart. You may also schedule an earlier appointment if necessary.  Additionally, you may be receiving a survey about your experience at our office within a few days to 1 week by e-mail or mail. We value your feedback.  Saralyn Pilar, DO [facility], [place]

## 2022-12-12 ENCOUNTER — Encounter: Payer: Self-pay | Admitting: Oncology

## 2022-12-13 ENCOUNTER — Ambulatory Visit: Payer: Self-pay
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
## 2022-12-19 ENCOUNTER — Ambulatory Visit (INDEPENDENT_AMBULATORY_CARE_PROVIDER_SITE_OTHER): Payer: Medicare HMO | Admitting: Podiatry

## 2022-12-19 ENCOUNTER — Encounter: Payer: Self-pay | Admitting: Oncology

## 2022-12-19 ENCOUNTER — Encounter: Payer: Self-pay | Admitting: Podiatry

## 2022-12-19 DIAGNOSIS — B070 Plantar wart: Secondary | ICD-10-CM

## 2022-12-19 MED ORDER — LIDOCAINE-SALICYLIC ACID 5-40 % EX GEL: 1.0000 | Freq: Every day | CUTANEOUS | 2 refills | Status: DC

## 2022-12-19 NOTE — Progress Notes (Signed)
   No chief complaint on file.   Subjective: 67 y.o. 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  Patient was unable to follow-up because of insurance purposes but now she has insurance and presents today for follow-up care of her skin and feet    Past Medical History:  Diagnosis Date   Anxiety    Cancer (HCC)    Hypertension     Objective: Physical Exam General: The patient is alert and oriented x3 in no acute distress.   Dermatology: There continues to be multiple hyperkeratotic skin lesions noted to the plantar aspect of the bilateral feet approximately 1 cm in diameter. Pinpoint bleeding noted upon debridement. Skin is warm, dry and supple bilateral lower extremities. Negative for open lesions or macerations.   Vascular: Palpable pedal pulses bilaterally. No edema or erythema noted. Capillary refill within normal limits.   Neurological: Grossly tact via light touch   Musculoskeletal Exam:  No pedal deformities noted   Assessment: 1. plantar wart bilateral feet    Plan of Care:  -Patient was evaluated. -Excisional debridement of the plantar wart lesion(s) was performed using a chisel blade. Cantharone was applied and the lesion(s) was dressed with a dry sterile dressing. -Prescription for salicylic acid-lidocaine 40-3% gel.  Apply daily -Return to clinic 4 weeks    [name], DPM [facility]  Dr. [name], DPM    [address] [place], [place] [address]                Office [phone]  Fax [phone]

## 2022-12-20 ENCOUNTER — Other Ambulatory Visit: Payer: Self-pay | Admitting: *Deleted

## 2022-12-20 ENCOUNTER — Encounter: Payer: Self-pay | Admitting: Oncology

## 2022-12-20 ENCOUNTER — Telehealth: Payer: Self-pay | Admitting: *Deleted

## 2022-12-20 DIAGNOSIS — Z1211 Encounter for screening for malignant neoplasm of colon: Secondary | ICD-10-CM

## 2022-12-20 MED ORDER — NA SULFATE-K SULFATE-MG SULF 17.5-3.13-1.6 GM/177ML PO SOLN: 1.0000 | Freq: Once | ORAL | 0 refills | Status: AC

## 2022-12-20 NOTE — Telephone Encounter (Signed)
Gastroenterology Pre-Procedure Review  Request Date: 01/31/2023 Requesting Physician: Dr. Allegra Lai  Patient did not completed the last colonoscopy that was scheduled by in 10/02/2017. Request this day because of transportation and there less patients on this day for Dr Allegra Lai so far.  PATIENT REVIEW QUESTIONS: The patient responded to the following health history questions as indicated:    1. Are you having any GI issues? yes (GERD) 2. Do you have a personal history of Polyps? no 3. Do you have a family history of Colon Cancer or Polyps? no 4. Diabetes Mellitus? no 5. Joint replacements in the past 12 months?no 6. Major health problems in the past 3 months?no 7. Any artificial heart valves, MVP, or defibrillator?no    MEDICATIONS & ALLERGIES:    Patient reports the following regarding taking any anticoagulation/antiplatelet therapy:   Plavix, Coumadin, Eliquis, Xarelto, Lovenox, Pradaxa, Brilinta, or Effient? no Aspirin? no  Patient confirms/reports the following medications:  Current Outpatient Medications  Medication Sig Dispense Refill   acetaminophen (TYLENOL) 500 MG tablet Take 500 mg by mouth every 6 (six) hours as needed.     amLODipine (NORVASC) 10 MG tablet TAKE 1 TABLET BY MOUTH EVERY DAY 90 tablet 1   baclofen (LIORESAL) 10 MG tablet Take 0.5 tablets (5 mg total) by mouth 2 (two) times daily. 60 each 2   clonazePAM (KLONOPIN) 1 MG tablet Take 1-2 mg by mouth 3 (three) times daily as needed. Reported on 10/11/2015     cyclobenzaprine (FLEXERIL) 10 MG tablet Take 1 tablet (10 mg total) by mouth 3 (three) times daily as needed for muscle spasms. 30 tablet 0   fluticasone (FLONASE) 50 MCG/ACT nasal spray Place 2 sprays into both nostrils daily. 16 g 11   hydrochlorothiazide (HYDRODIURIL) 25 MG tablet TAKE 1 TABLET (25 MG TOTAL) BY MOUTH DAILY. 90 tablet 1   ketotifen (ZADITOR) 0.025 % ophthalmic solution Place 1 drop into both eyes 2 (two) times daily. 10 mL 0   KLOR-CON M20 20 MEQ  tablet TAKE 1 TABLET BY MOUTH EVERY DAY 90 tablet 1   Lidocaine-Salicylic Acid 5-40 % GEL Apply 1 Application topically daily. 30 g 2   loratadine (CLARITIN) 10 MG tablet Take 1 tablet (10 mg total) by mouth daily. Use for 4-6 weeks then stop, and use as needed or seasonally 90 tablet 3   ondansetron (ZOFRAN) 4 MG tablet Take 1 tablet (4 mg total) by mouth every 8 (eight) hours as needed for nausea or vomiting. 60 tablet 2   pantoprazole (PROTONIX) 40 MG tablet Take 1 tablet (40 mg total) by mouth daily. 90 tablet 3   prochlorperazine (COMPAZINE) 10 MG tablet Take 1 tablet (10 mg total) by mouth every 6 (six) hours as needed for nausea or vomiting. 30 tablet 0   trimethoprim-polymyxin b (POLYTRIM) ophthalmic solution Place 1 drop into the right eye in the morning, at noon, in the evening, and at bedtime. For 1-2 weeks or until resolved. 10 mL 0   VENTOLIN HFA 108 (90 Base) MCG/ACT inhaler Inhale 1-2 puffs into the lungs every 6 (six) hours as needed for wheezing or shortness of breath. 18 g 3   No current facility-administered medications for this visit.    Patient confirms/reports the following allergies:  Allergies  Allergen Reactions   Doxycycline Nausea And Vomiting   Penicillins Hives    No orders of the defined types were placed in this encounter.   AUTHORIZATION INFORMATION Primary Insurance: 1D#: Group #:  Secondary Insurance: 1D#: Group #:  SCHEDULE INFORMATION: Date: 01/31/2023 Time: Location:  [facility]

## 2022-12-25 ENCOUNTER — Other Ambulatory Visit: Payer: Self-pay | Admitting: Family Medicine

## 2022-12-25 DIAGNOSIS — K219 Gastro-esophageal reflux disease without esophagitis: Secondary | ICD-10-CM

## 2023-01-04 DIAGNOSIS — H25813 Combined forms of age-related cataract, bilateral: Secondary | ICD-10-CM | POA: Diagnosis not present

## 2023-01-08 ENCOUNTER — Other Ambulatory Visit: Payer: Self-pay | Admitting: Family Medicine

## 2023-01-08 ENCOUNTER — Ambulatory Visit: Payer: Self-pay | Admitting: *Deleted

## 2023-01-08 DIAGNOSIS — I10 Essential (primary) hypertension: Secondary | ICD-10-CM

## 2023-01-08 DIAGNOSIS — Z20822 Contact with and (suspected) exposure to covid-19: Secondary | ICD-10-CM

## 2023-01-08 DIAGNOSIS — R051 Acute cough: Secondary | ICD-10-CM

## 2023-01-08 MED ORDER — PREDNISONE 20 MG PO TABS
ORAL_TABLET | ORAL | 0 refills | Status: DC
Start: 2023-01-08 — End: 2023-03-06

## 2023-01-08 MED ORDER — HYDROCOD POLI-CHLORPHE POLI ER 10-8 MG/5ML PO SUER
5.0000 mL | Freq: Two times a day (BID) | ORAL | 0 refills | Status: DC | PRN
Start: 2023-01-08 — End: 2023-03-06

## 2023-01-08 MED ORDER — NIRMATRELVIR/RITONAVIR (PAXLOVID)TABLET
3.0000 | ORAL_TABLET | Freq: Two times a day (BID) | ORAL | 0 refills | Status: AC
Start: 2023-01-08 — End: 2023-01-13
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
## 2023-01-08 NOTE — Telephone Encounter (Signed)
I would have seen her for MyChart Virtual as an add on, but it appears she was not added on to schedule.  I have called her and we discussed her case on the phone and I agreed to send in cough syrup + prednisone for acute symptoms, I advised her to get COVID test and run this test if positive pick up Paxlovid also sent rx if negative do not take med.  Saralyn Pilar, DO [facility] 01/08/2023, 5:03 PM

## 2023-01-16 ENCOUNTER — Encounter: Payer: Self-pay | Admitting: Podiatry

## 2023-01-16 ENCOUNTER — Ambulatory Visit (INDEPENDENT_AMBULATORY_CARE_PROVIDER_SITE_OTHER): Payer: Medicare HMO | Admitting: Podiatry

## 2023-01-16 VITALS — BP 123/66 | HR 66

## 2023-01-16 DIAGNOSIS — D2372 Other benign neoplasm of skin of left lower limb, including hip: Secondary | ICD-10-CM | POA: Diagnosis not present

## 2023-01-16 MED ORDER — LIDOCAINE-SALICYLIC ACID 5-40 % EX GEL: 1.0000 | Freq: Every day | CUTANEOUS | 2 refills | Status: AC

## 2023-01-16 NOTE — Progress Notes (Signed)
   Chief Complaint  Patient presents with   Callouses    "They hurt."    Subjective: 67 y.o. female presenting to the office today for follow-up evaluation of symptomatic skin lesions to the plantar aspect of the bilateral feet.  Very tender and painful with palpation.  With each debridement they feel better but eventually the lesions return.   Past Medical History:  Diagnosis Date   Anxiety    Cancer (HCC)    Hypertension     Past Surgical History:  Procedure Laterality Date   ABDOMINAL HYSTERECTOMY     BREAST BIOPSY Left    age 43 surgical bx neg   CESAREAN SECTION     cyst removed     breast, benign    Allergies  Allergen Reactions   Doxycycline Nausea And Vomiting   Penicillins Hives     Objective:  Physical Exam General: Alert and oriented x3 in no acute distress  Dermatology: Hyperkeratotic lesion(s) present on the bilateral feet. Pain on palpation with a central nucleated core noted. Skin is warm, dry and supple bilateral lower extremities. Negative for open lesions or macerations.  Vascular: Palpable pedal pulses bilaterally. No edema or erythema noted. Capillary refill within normal limits.  Neurological: Grossly intact via light touch  Musculoskeletal Exam: Pain on palpation at the keratotic lesion(s) noted. Range of motion within normal limits bilateral. Muscle strength 5/5 in all groups bilateral.  Assessment: 1.  Symptomatic benign skin lesions bilateral feet   Plan of Care:  -Patient evaluated -Excisional debridement of keratoic lesion(s) using a chisel blade was performed without incident.  -Salicylic acid applied with a bandaid -Refill prescription for salicylic acid-lidocaine 40-3% gel.  Apply daily.  -Return to the clinic PRN.   [name], DPM [facility]  Dr. [name], DPM    [address] [place], [place] [address]                Office [phone]  Fax [phone]

## 2023-01-19 ENCOUNTER — Other Ambulatory Visit: Payer: Self-pay | Admitting: *Deleted

## 2023-01-19 DIAGNOSIS — D693 Immune thrombocytopenic purpura: Secondary | ICD-10-CM

## 2023-01-19 NOTE — Progress Notes (Signed)
cbv

## 2023-01-22 ENCOUNTER — Inpatient Hospital Stay (HOSPITAL_BASED_OUTPATIENT_CLINIC_OR_DEPARTMENT_OTHER): Payer: Medicare HMO | Admitting: Oncology

## 2023-01-22 ENCOUNTER — Inpatient Hospital Stay: Payer: Medicare HMO | Attending: Oncology

## 2023-01-22 ENCOUNTER — Encounter: Payer: Self-pay | Admitting: Oncology

## 2023-01-22 VITALS — BP 119/71 | HR 63 | Temp 96.5°F | Resp 16 | Ht 64.0 in | Wt 165.0 lb

## 2023-01-22 DIAGNOSIS — D693 Immune thrombocytopenic purpura: Secondary | ICD-10-CM | POA: Diagnosis not present

## 2023-01-22 DIAGNOSIS — Z79899 Other long term (current) drug therapy: Secondary | ICD-10-CM | POA: Insufficient documentation

## 2023-01-22 LAB — CBC WITH DIFFERENTIAL/PLATELET
Abs Immature Granulocytes: 0.06 10*3/uL (ref 0.00–0.07)
Basophils Absolute: 0 10*3/uL (ref 0.0–0.1)
Basophils Relative: 0 %
Eosinophils Absolute: 1.7 10*3/uL — ABNORMAL HIGH (ref 0.0–0.5)
Eosinophils Relative: 17 %
HCT: 43.2 % (ref 36.0–46.0)
Hemoglobin: 14.4 g/dL (ref 12.0–15.0)
Immature Granulocytes: 1 %
Lymphocytes Relative: 14 %
Lymphs Abs: 1.4 10*3/uL (ref 0.7–4.0)
MCH: 28.1 pg (ref 26.0–34.0)
MCHC: 33.3 g/dL (ref 30.0–36.0)
MCV: 84.4 fL (ref 80.0–100.0)
Monocytes Absolute: 0.5 10*3/uL (ref 0.1–1.0)
Monocytes Relative: 5 %
Neutro Abs: 6.5 10*3/uL (ref 1.7–7.7)
Neutrophils Relative %: 63 %
Platelets: 251 10*3/uL (ref 150–400)
RBC: 5.12 MIL/uL — ABNORMAL HIGH (ref 3.87–5.11)
RDW: 14.8 % (ref 11.5–15.5)
Smear Review: NORMAL
WBC: 10.2 10*3/uL (ref 4.0–10.5)
nRBC: 0 % (ref 0.0–0.2)
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
## 2023-01-23 ENCOUNTER — Other Ambulatory Visit: Payer: Self-pay

## 2023-01-30 ENCOUNTER — Telehealth: Payer: Self-pay
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
## 2023-01-31 ENCOUNTER — Ambulatory Visit: Admission: RE | Admit: 2023-01-31 | Payer: Medicare HMO | Source: Home / Self Care | Admitting: Gastroenterology

## 2023-01-31 SURGERY — COLONOSCOPY WITH PROPOFOL
Anesthesia: General

## 2023-02-13 ENCOUNTER — Ambulatory Visit (INDEPENDENT_AMBULATORY_CARE_PROVIDER_SITE_OTHER): Payer: Medicare HMO | Admitting: Podiatry

## 2023-02-13 DIAGNOSIS — Z91199 Patient's noncompliance with other medical treatment and regimen due to unspecified reason: Secondary | ICD-10-CM

## 2023-02-13 NOTE — Progress Notes (Signed)
   Complete physical exam  Patient: Erica Bartlett   DOB: 02/11/1997   67 y.o. Female  MRN: [id number]  Subjective:    No chief complaint on file.   Erica Bartlett is a 67 y.o. female who presents today for a complete physical exam. She reports consuming a {diet types:17450} diet. {types:19826} She generally feels {DESC; WELL/FAIRLY WELL/POORLY:18703}. She reports sleeping {DESC; WELL/FAIRLY WELL/POORLY:18703}. She {does/does not:200015} have additional problems to discuss today.    Most recent fall risk assessment:    10/19/2021   10:42 AM  Fall Risk   Falls in the past year? 0  Number falls in past yr: 0  Injury with Fall? 0  Risk for fall due to : No Fall Risks  Follow up Falls evaluation completed     Most recent depression screenings:    10/19/2021   10:42 AM 09/09/2020   10:46 AM  PHQ 2/9 Scores  PHQ - 2 Score 0 0  PHQ- 9 Score 5     {VISON DENTAL STD PSA (Optional):27386}  {History (Optional):23778}  Patient Care Team: Jessup, Joy, NP as PCP - General (Nurse Practitioner)   Outpatient Medications Prior to Visit  Medication Sig   fluticasone (FLONASE) 50 MCG/ACT nasal spray Place 2 sprays into both nostrils in the morning and at bedtime. After 7 days, reduce to once daily.   norgestimate-ethinyl estradiol (SPRINTEC 28) 0.25-35 MG-MCG tablet Take 1 tablet by mouth daily.   Nystatin POWD Apply liberally to affected area 2 times per day   spironolactone (ALDACTONE) 100 MG tablet Take 1 tablet (100 mg total) by mouth daily.   No facility-administered medications prior to visit.    ROS        Objective:     There were no vitals taken for this visit. {Vitals History (Optional):23777}  Physical Exam   No results found for any visits on 11/24/21. {Show previous labs (optional):23779}    Assessment & Plan:    Routine Health Maintenance and Physical Exam  Immunization History  Administered Date(s) Administered   DTaP 04/27/1999, 06/23/1999,  09/01/1999, 05/17/2000, 12/01/2003   Hepatitis A 09/27/2007, 10/02/2008   Hepatitis B 02/12/1999, 03/22/1999, 09/01/1999   HiB (PRP-OMP) 04/27/1999, 06/23/1999, 09/01/1999, 05/17/2000   IPV 04/27/1999, 06/23/1999, 02/20/2000, 12/01/2003   Influenza,inj,Quad PF,6+ Mos 01/02/2014   Influenza-Unspecified 04/03/2012   MMR 02/19/2001, 12/01/2003   Meningococcal Polysaccharide 10/02/2011   Pneumococcal Conjugate-13 05/17/2000   Pneumococcal-Unspecified 09/01/1999, 11/15/1999   Tdap 10/02/2011   Varicella 02/20/2000, 09/27/2007    Health Maintenance  Topic Date Due   HIV Screening  Never done   Hepatitis C Screening  Never done   INFLUENZA VACCINE  11/22/2021   PAP-Cervical Cytology Screening  11/24/2021 (Originally 02/11/2020)   PAP SMEAR-Modifier  11/24/2021 (Originally 02/11/2020)   TETANUS/TDAP  11/24/2021 (Originally 10/01/2021)   HPV VACCINES  Discontinued   COVID-19 Vaccine  Discontinued    Discussed health benefits of physical activity, and encouraged her to engage in regular exercise appropriate for her age and condition.  Problem List Items Addressed This Visit   None Visit Diagnoses     Annual physical exam    -  Primary   Cervical cancer screening       Need for Tdap vaccination          No follow-ups on file.     Joy Jessup, NP   

## 2023-03-06 ENCOUNTER — Encounter: Payer: Self-pay | Admitting: Family Medicine

## 2023-03-06 ENCOUNTER — Encounter: Payer: Self-pay | Admitting: Oncology

## 2023-03-06 ENCOUNTER — Ambulatory Visit (INDEPENDENT_AMBULATORY_CARE_PROVIDER_SITE_OTHER): Payer: Medicare HMO | Admitting: Family Medicine

## 2023-03-06 VITALS — BP 120/80 | Ht 64.0 in | Wt 161.0 lb

## 2023-03-06 DIAGNOSIS — J40 Bronchitis, not specified as acute or chronic: Secondary | ICD-10-CM

## 2023-03-06 DIAGNOSIS — J22 Unspecified acute lower respiratory infection: Secondary | ICD-10-CM | POA: Diagnosis not present

## 2023-03-06 DIAGNOSIS — J019 Acute sinusitis, unspecified: Secondary | ICD-10-CM

## 2023-03-06 DIAGNOSIS — J0110 Acute frontal sinusitis, unspecified: Secondary | ICD-10-CM

## 2023-03-06 MED ORDER — VENTOLIN HFA 108 (90 BASE) MCG/ACT IN AERS: 1.0000 | INHALATION_SPRAY | Freq: Four times a day (QID) | RESPIRATORY_TRACT | 2 refills | Status: AC | PRN

## 2023-03-06 MED ORDER — LEVOFLOXACIN 500 MG PO TABS
500.0000 mg | ORAL_TABLET | Freq: Every day | ORAL | 0 refills | Status: DC
Start: 2023-03-06 — End: 2023-03-12

## 2023-03-06 MED ORDER — BENZONATATE 100 MG PO CAPS
100.0000 mg | ORAL_CAPSULE | Freq: Three times a day (TID) | ORAL | 0 refills | Status: DC | PRN
Start: 2023-03-06 — End: 2023-06-01

## 2023-03-06 MED ORDER — PSEUDOEPH-BROMPHEN-DM 30-2-10 MG/5ML PO SYRP
5.0000 mL | ORAL_SOLUTION | Freq: Four times a day (QID) | ORAL | 0 refills | Status: DC | PRN
Start: 2023-03-06 — End: 2023-03-12

## 2023-03-06 MED ORDER — PREDNISONE 20 MG PO TABS
ORAL_TABLET | ORAL | 0 refills | Status: DC
Start: 2023-03-06 — End: 2023-03-12
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
## 2023-03-06 NOTE — Patient Instructions (Addendum)
Thank you for coming to the office today.  Rx orders today  Start taking Levaquin antibiotic 500mg  daily x 7 days  Albuterol rescue inhaler 2 puffs every 4-6 hours as needed for coughing wheezing  Start Tessalon Perls take 1 capsule up to 3 times a day as needed for cough  Cough Syrup as needed.  Prednisone taper   Please schedule a Follow-up Appointment to: Return if symptoms worsen or fail to improve.  If you have any other questions or concerns, please feel free to call the office or send a message through MyChart. You may also schedule an earlier appointment if necessary.  Additionally, you may be receiving a survey about your experience at our office within a few days to 1 week by e-mail or mail. We value your feedback.  Saralyn Pilar, DO [facility], [place]

## 2023-03-12 ENCOUNTER — Ambulatory Visit
Admission: RE | Admit: 2023-03-12 | Discharge: 2023-03-12 | Disposition: A | Discharge status: Home / Self Care | Payer: Medicare HMO | Attending: Family Medicine | Admitting: Family Medicine

## 2023-03-12 ENCOUNTER — Encounter: Payer: Self-pay | Admitting: Family Medicine

## 2023-03-12 ENCOUNTER — Ambulatory Visit (INDEPENDENT_AMBULATORY_CARE_PROVIDER_SITE_OTHER): Payer: Medicare HMO | Admitting: Family Medicine

## 2023-03-12 ENCOUNTER — Encounter: Payer: Self-pay | Admitting: Oncology

## 2023-03-12 ENCOUNTER — Ambulatory Visit
Admission: RE | Admit: 2023-03-12 | Discharge: 2023-03-12 | Disposition: A | Discharge status: Home / Self Care | Payer: Medicare HMO | Source: Ambulatory Visit | Attending: Family Medicine | Admitting: Family Medicine

## 2023-03-12 VITALS — BP 138/84 | Ht 64.0 in | Wt 161.0 lb

## 2023-03-12 DIAGNOSIS — R0989 Other specified symptoms and signs involving the circulatory and respiratory systems: Secondary | ICD-10-CM

## 2023-03-12 DIAGNOSIS — J40 Bronchitis, not specified as acute or chronic: Secondary | ICD-10-CM

## 2023-03-12 DIAGNOSIS — J9801 Acute bronchospasm: Secondary | ICD-10-CM | POA: Diagnosis not present

## 2023-03-12 DIAGNOSIS — J0110 Acute frontal sinusitis, unspecified: Secondary | ICD-10-CM | POA: Diagnosis not present

## 2023-03-12 DIAGNOSIS — I700 Atherosclerosis of aorta: Secondary | ICD-10-CM | POA: Diagnosis not present

## 2023-03-12 MED ORDER — PREDNISONE 50 MG PO TABS: 50.0000 mg | ORAL_TABLET | Freq: Every day | ORAL | 0 refills | Status: DC

## 2023-03-12 MED ORDER — HYDROCOD POLI-CHLORPHE POLI ER 10-8 MG/5ML PO SUER: 5.0000 mL | Freq: Two times a day (BID) | ORAL | 0 refills | Status: DC | PRN

## 2023-03-12 MED ORDER — AMOXICILLIN-POT CLAVULANATE 875-125 MG PO TABS: 1.0000 | ORAL_TABLET | Freq: Two times a day (BID) | ORAL | 0 refills | Status: DC

## 2023-03-12 NOTE — Patient Instructions (Addendum)
Thank you for coming to the office today.  Extend prednisone to 5 more day burst Switch to Augmentin antibiotic. Chest X-ray today stay tuned for results  New cough syrup w/ hydrocodone  Please schedule a Follow-up Appointment to: Return if symptoms worsen or fail to improve.  If you have any other questions or concerns, please feel free to call the office or send a message through MyChart. You may also schedule an earlier appointment if necessary.  Additionally, you may be receiving a survey about your experience at our office within a few days to 1 week by e-mail or mail. We value your feedback.  Saralyn Pilar, DO [facility], [place]

## 2023-03-12 NOTE — Progress Notes (Signed)
Subjective:    Patient ID: Erica Bartlett, female    DOB: Oct 21, 1955, 67 y.o.   MRN: [id number]  Erica Bartlett is a 67 y.o. female presenting on 03/12/2023 for Cough   HPI  Discussed the use of AI scribe software for clinical note transcription with the patient, who gave verbal consent to proceed.      Sinusitis vs Bronchitis Possible CAP Pneumonia  Follow-up, since last visit 11/12, similar symptoms with respiratory infection, she was treated with Levaquin, Tessalon, Cough Syrup (non codeine), Prednisone. - She felt improved initially for a few days, and then it worsened again by end of the week. Now not improved. - Still has lower chest congestion and productive cough, thicker brown sputum Today requesting further treatment since unresolved. She has multiple sick contacts in the home. She has immunosuppression due to chemotherapy. Usually responds well to antibiotics and steroids       07/21/2022    2:27 PM 06/23/2022    9:31 AM 04/07/2022   12:49 PM  Depression screen PHQ 2/9  Decreased Interest 1 0 1  Down, Depressed, Hopeless 1 1 2   PHQ - 2 Score 2 1 3   Altered sleeping 3 1 1   Tired, decreased energy 3 1 3   Change in appetite 2 0 0  Feeling bad or failure about yourself  0 0 1  Trouble concentrating 1 0 1  Moving slowly or fidgety/restless 1 0 0  Suicidal thoughts 0 0 0  PHQ-9 Score 12 3 9   Difficult doing work/chores Not difficult at all Not difficult at all Somewhat difficult       07/21/2022    2:28 PM 03/30/2022   12:13 PM 08/10/2021    2:43 PM 10/15/2018    3:57 PM  GAD 7 : Generalized Anxiety Score  Nervous, Anxious, on Edge 2 1 2 1   Control/stop worrying 1 1 1 1   Worry too much - different things 1 1 1 1   Trouble relaxing 2 1 1 2   Restless 1 1 0 0  Easily annoyed or irritable 1 1 1  0  Afraid - awful might happen 0 1 1 1   Total GAD 7 Score 8 7 7 6   Anxiety Difficulty Somewhat difficult Somewhat difficult Not difficult at all Not difficult  at all    Social History   Tobacco Use   Smoking status: Some Days    Current packs/day: 0.15    Types: Cigarettes   Smokeless tobacco: Never   Tobacco comments:    1 PACK A WEEK  Vaping Use   Vaping status: Never Used  Substance Use Topics   Alcohol use: No   Drug use: No    Review of Systems Per HPI unless specifically indicated above     Objective:    BP 138/84   Ht 5\' 4"  (1.626 m)   Wt 161 lb (73 kg)   BMI 27.64 kg/m   Wt Readings from Last 3 Encounters:  03/12/23 161 lb (73 kg)  03/06/23 161 lb (73 kg)  01/22/23 165 lb (74.8 kg)    Physical Exam Vitals and nursing note reviewed.  Constitutional:      General: She is not in acute distress.    Appearance: She is well-developed. She is not diaphoretic.     Comments: Well-appearing, comfortable, cooperative  HENT:     Head: Normocephalic and atraumatic.  Eyes:     General:        Right eye: No discharge.  Left eye: No discharge.     Conjunctiva/sclera: Conjunctivae normal.  Neck:     Thyroid: No thyromegaly.  Cardiovascular:     Rate and Rhythm: Normal rate and regular rhythm.     Heart sounds: Normal heart sounds. No murmur heard. Pulmonary:     Effort: Pulmonary effort is normal. No respiratory distress.     Breath sounds: Rhonchi (bilateral bases R>L) and rales present. No wheezing.     Comments: cough Musculoskeletal:        General: Normal range of motion.     Cervical back: Normal range of motion and neck supple.  Lymphadenopathy:     Cervical: No cervical adenopathy.  Skin:    General: Skin is warm and dry.     Findings: No erythema or rash.  Neurological:     Mental Status: She is alert and oriented to person, place, and time.  Psychiatric:        Behavior: Behavior normal.     Comments: Well groomed, good eye contact, normal speech and thoughts    I have personally reviewed the radiology report from 03/12/23 on CXR STAT.  CLINICAL DATA:  Bronchitis   EXAM: CHEST - 2 VIEW    COMPARISON:  04/03/2018   FINDINGS: No consolidation, pneumothorax or effusion. No edema. Normal cardiopericardial silhouette. Calcified aorta. Eventration of the right hemidiaphragm.   IMPRESSION: No acute cardiopulmonary disease.     Electronically Signed   By: Karen Kays M.D.   On: 03/12/2023 13:38  Results for orders placed or performed in visit on 01/22/23  CBC with Differential/Platelet  Result Value Ref Range   WBC 10.2 4.0 - 10.5 K/uL   RBC 5.12 (H) 3.87 - 5.11 MIL/uL   Hemoglobin 14.4 12.0 - 15.0 g/dL   HCT 84.1 32.4 - 40.1 %   MCV 84.4 80.0 - 100.0 fL   MCH 28.1 26.0 - 34.0 pg   MCHC 33.3 30.0 - 36.0 g/dL   RDW 02.7 25.3 - 66.4 %   Platelets 251 150 - 400 K/uL   nRBC 0.0 0.0 - 0.2 %   Neutrophils Relative % 63 %   Neutro Abs 6.5 1.7 - 7.7 K/uL   Lymphocytes Relative 14 %   Lymphs Abs 1.4 0.7 - 4.0 K/uL   Monocytes Relative 5 %   Monocytes Absolute 0.5 0.1 - 1.0 K/uL   Eosinophils Relative 17 %   Eosinophils Absolute 1.7 (H) 0.0 - 0.5 K/uL   Basophils Relative 0 %   Basophils Absolute 0.0 0.0 - 0.1 K/uL   WBC Morphology UNREMARKABLE    RBC Morphology UNREMARKABLE    Smear Review Normal platelet morphology    Immature Granulocytes 1 %   Abs Immature Granulocytes 0.06 0.00 - 0.07 K/uL      Assessment & Plan:   Problem List Items Addressed This Visit   None Visit Diagnoses     Acute non-recurrent frontal sinusitis    -  Primary   Relevant Medications   amoxicillin-clavulanate (AUGMENTIN) 875-125 MG tablet   predniSONE (DELTASONE) 50 MG tablet   chlorpheniramine-HYDROcodone (TUSSIONEX) 10-8 MG/5ML   Chest congestion       Relevant Medications   amoxicillin-clavulanate (AUGMENTIN) 875-125 MG tablet   chlorpheniramine-HYDROcodone (TUSSIONEX) 10-8 MG/5ML   Other Relevant Orders   DG Chest 2 View   Bronchitis       Relevant Medications   chlorpheniramine-HYDROcodone (TUSSIONEX) 10-8 MG/5ML   Other Relevant Orders   DG Chest 2 View    Bronchospasm, acute  Relevant Medications   predniSONE (DELTASONE) 50 MG tablet   chlorpheniramine-HYDROcodone (TUSSIONEX) 10-8 MG/5ML        Sinusitis vs Bronchitis Cannot rule out CAP  Persistent symptoms despite a course of Levaquin and steroids. Noted temporary improvement but symptoms worsened again. Productive cough with brownish sputum. No clear improvement after 7 days of treatment.  -Order STAT chest X-ray today to further evaluate. - Reviewed results later today. It is negative for pneumonia or other abnormality  -Start Augmentin as a different antibiotic. -Start Prednisone 50mg  for 5 days (non-tapered course). Advised this is MAX steroid she can take, will need to PAUSE after these 5 days. - Order Tussionex cough syrup to see if covered or better results. She did not pick up last rx due to cost.     Orders Placed This Encounter  Procedures   DG Chest 2 View    Standing Status:   Future    Number of Occurrences:   1    Standing Expiration Date:   03/11/2024    Order Specific Question:   Reason for Exam (SYMPTOM  OR DIAGNOSIS REQUIRED)    Answer:   bronchitis not improving 1 week, rule out pneumonia or other cause    Order Specific Question:   Preferred imaging location?    Answer:   [facility]    Meds ordered this encounter  Medications   amoxicillin-clavulanate (AUGMENTIN) 875-125 MG tablet    Sig: Take 1 tablet by mouth 2 (two) times daily.    Dispense:  20 tablet    Refill:  0   predniSONE (DELTASONE) 50 MG tablet    Sig: Take 1 tablet (50 mg total) by mouth daily with breakfast.    Dispense:  5 tablet    Refill:  0   chlorpheniramine-HYDROcodone (TUSSIONEX) 10-8 MG/5ML    Sig: Take 5 mLs by mouth every 12 (twelve) hours as needed for cough.    Dispense:  115 mL    Refill:  0    Follow up plan: Return if symptoms worsen or fail to improve.    Saralyn Pilar, DO [facility] 03/12/2023,  10:39 AM

## 2023-03-13 ENCOUNTER — Telehealth: Payer: Self-pay

## 2023-03-13 NOTE — Telephone Encounter (Signed)
-----   Message from Saralyn Pilar sent at 03/12/2023  6:15 PM EST ----- Please notify patient of X-ray results  Chest X-ray is negative for any sign of pneumonia or other abnormality. Most likely symptoms are from respiratory infection bronchitis but no active pneumonia. No change to treatment plan.  Saralyn Pilar, DO [facility] 03/12/2023, 6:15 PM

## 2023-03-13 NOTE — Telephone Encounter (Signed)
Patient informed of results.  

## 2023-03-30 ENCOUNTER — Ambulatory Visit: Payer: Self-pay

## 2023-03-30 DIAGNOSIS — B9689 Other specified bacterial agents as the cause of diseases classified elsewhere: Secondary | ICD-10-CM | POA: Diagnosis not present

## 2023-03-30 DIAGNOSIS — R051 Acute cough: Secondary | ICD-10-CM | POA: Diagnosis not present

## 2023-03-30 DIAGNOSIS — J019 Acute sinusitis, unspecified: Secondary | ICD-10-CM | POA: Diagnosis not present

## 2023-03-30 NOTE — Telephone Encounter (Signed)
She could try a [place] virtual visit by another provider through MyChart if she does not want to go to urgent care

## 2023-03-30 NOTE — Telephone Encounter (Signed)
Patient was advised to go to urgent care to be seen today. Or an appointment could be made for next week.   Patient stated she didn't want to go to urgent care.   Please advis

## 2023-03-30 NOTE — Telephone Encounter (Signed)
    Chief Complaint: Seen 03/12/23 with cough. "I feel worse. Coughing up thick brown mucus." Asking to be seen today. No availability. Symptoms: Sore throat Frequency: 3 weeks Pertinent Negatives: Patient denies fever Disposition: [] ED /[] Urgent Care (no appt availability in office) / [] Appointment(In office/virtual)/ []  [place] [facility]/ [] Home Care/ [] Refused Recommended Disposition /[] [facility]/ [x]  Follow-up with PCP Additional Notes: Please advise pt.  Reason for Disposition  SEVERE coughing spells (e.g., whooping sound after coughing, vomiting after coughing)  Answer Assessment - Initial Assessment Questions 1. ONSET: "When did the cough begin?"      3 weeks ago 2. SEVERITY: "How bad is the cough today?"      Worse at night 3. SPUTUM: "Describe the color of your sputum" (none, dry cough; clear, white, yellow, green)     Thick, brown 4. HEMOPTYSIS: "Are you coughing up any blood?" If so ask: "How much?" (flecks, streaks, tablespoons, etc.)     No 5. DIFFICULTY BREATHING: "Are you having difficulty breathing?" If Yes, ask: "How bad is it?" (e.g., mild, moderate, severe)    - MILD: No SOB at rest, mild SOB with walking, speaks normally in sentences, can lie down, no retractions, pulse < 100.    - MODERATE: SOB at rest, SOB with minimal exertion and prefers to sit, cannot lie down flat, speaks in phrases, mild retractions, audible wheezing, pulse 100-120.    - SEVERE: Very SOB at rest, speaks in single words, struggling to breathe, sitting hunched forward, retractions, pulse > 120      No 6. FEVER: "Do you have a fever?" If Yes, ask: "What is your temperature, how was it measured, and when did it start?"     No 7. CARDIAC HISTORY: "Do you have any history of heart disease?" (e.g., heart attack, congestive heart failure)      No 8. LUNG HISTORY: "Do you have any history of lung disease?"  (e.g., pulmonary embolus, asthma, emphysema)     No 9. PE RISK FACTORS: "Do  you have a history of blood clots?" (or: recent major surgery, recent prolonged travel, bedridden)     No 10. OTHER SYMPTOMS: "Do you have any other symptoms?" (e.g., runny nose, wheezing, chest pain)       Sore throat, rattling 11. PREGNANCY: "Is there any chance you are pregnant?" "When was your last menstrual period?"       No 12. TRAVEL: "Have you traveled out of the country in the last month?" (e.g., travel history, exposures)       No  Protocols used: Cough - Acute Productive-A-AH

## 2023-03-30 NOTE — Telephone Encounter (Signed)
Spoke with patient. She decided to be seen at urgent care today.

## 2023-04-23 ENCOUNTER — Inpatient Hospital Stay: Payer: Medicare HMO | Attending: Oncology

## 2023-04-24 ENCOUNTER — Encounter: Payer: Self-pay | Admitting: Oncology

## 2023-05-01 ENCOUNTER — Ambulatory Visit (INDEPENDENT_AMBULATORY_CARE_PROVIDER_SITE_OTHER): Payer: Medicare HMO | Admitting: Podiatry

## 2023-05-01 DIAGNOSIS — D2371 Other benign neoplasm of skin of right lower limb, including hip: Secondary | ICD-10-CM

## 2023-05-01 DIAGNOSIS — D492 Neoplasm of unspecified behavior of bone, soft tissue, and skin: Secondary | ICD-10-CM

## 2023-05-01 NOTE — Progress Notes (Signed)
   No chief complaint on file.   Subjective: 68 y.o. female presenting to the office today for follow-up evaluation of symptomatic skin lesions to the plantar aspect of the bilateral feet.  Very tender and painful with palpation.  With each debridement they feel better but eventually the lesions return.   Past Medical History:  Diagnosis Date   Anxiety    Cancer (HCC)    Hypertension     Past Surgical History:  Procedure Laterality Date   ABDOMINAL HYSTERECTOMY     BREAST BIOPSY Left    age 89 surgical bx neg   CESAREAN SECTION     cyst removed     breast, benign    Allergies  Allergen Reactions   Doxycycline  Nausea And Vomiting   Penicillins Hives     Objective:  Physical Exam General: Alert and oriented x3 in no acute distress  Dermatology: Hyperkeratotic lesion(s) present on the bilateral feet. Pain on palpation with a central nucleated core noted. Skin is warm, dry and supple bilateral lower extremities. Negative for open lesions or macerations.  Vascular: Palpable pedal pulses bilaterally. No edema or erythema noted. Capillary refill within normal limits.  Neurological: Grossly intact via light touch  Musculoskeletal Exam: Pain on palpation at the keratotic lesion(s) noted. Range of motion within normal limits bilateral. Muscle strength 5/5 in all groups bilateral.  Assessment: 1.  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  -Salicylic acid  applied with a bandaid -Recommend OTC salicylic acid  daily -Return to the clinic PRN.   Thresa EMERSON Sar, DPM [facility]  Dr. Thresa EMERSON Sar, DPM    [address] [place], [place] [address]                Office [phone]  Fax [phone]

## 2023-05-24 ENCOUNTER — Other Ambulatory Visit: Payer: Self-pay

## 2023-06-01 ENCOUNTER — Encounter: Payer: Self-pay | Admitting: Internal Medicine

## 2023-06-01 ENCOUNTER — Ambulatory Visit: Payer: Medicare HMO | Admitting: Internal Medicine

## 2023-06-01 ENCOUNTER — Encounter: Payer: Self-pay | Admitting: Oncology

## 2023-06-01 VITALS — BP 112/62 | HR 77 | Temp 98.3°F | Ht 64.0 in | Wt 160.4 lb

## 2023-06-01 DIAGNOSIS — J069 Acute upper respiratory infection, unspecified: Secondary | ICD-10-CM | POA: Diagnosis not present

## 2023-06-01 DIAGNOSIS — J329 Chronic sinusitis, unspecified: Secondary | ICD-10-CM | POA: Diagnosis not present

## 2023-06-01 DIAGNOSIS — B9789 Other viral agents as the cause of diseases classified elsewhere: Secondary | ICD-10-CM

## 2023-06-01 LAB — POC COVID19/FLU A&B COMBO
Covid Antigen, POC: NEGATIVE
Influenza A Antigen, POC: NEGATIVE
Influenza B Antigen, POC: NEGATIVE

## 2023-06-01 MED ORDER — PREDNISONE 10 MG PO TABS: ORAL_TABLET | ORAL | 0 refills | Status: DC

## 2023-06-01 MED ORDER — BENZONATATE 100 MG PO CAPS
100.0000 mg | ORAL_CAPSULE | Freq: Two times a day (BID) | ORAL | 0 refills | Status: DC | PRN
Start: 2023-06-01 — End: 2023-08-16

## 2023-06-01 NOTE — Patient Instructions (Signed)

## 2023-06-01 NOTE — Progress Notes (Signed)
 Subjective:    Patient ID: Erica Bartlett, female    DOB: [birth year], 68 y.o.   MRN: [id number]  HPI  Discussed the use of AI scribe software for clinical note transcription with the patient, who gave verbal consent to proceed.   Erica Bartlett is a 68 year old female who presents with worsening cough and headache over the past three days.  Over the past three days, she has experienced a worsening cough and headache, with today being the most severe.  She also has a sore throat, colored nasal discharge, and post-nasal drip that requires spitting after coughing. No ear pain is present. She experienced fever-like symptoms, feeling hot and cold the previous night but has not checked her temperature. No vomiting, or diarrhea.  She has been taking Robitussin for her symptoms. She has a history of allergies but denies any history of COPD or asthma.   She notes that many people in her building are also sick, and she consistently wears a mask but acknowledges touching shared surfaces.       Review of Systems   Past Medical History:  Diagnosis Date   Anxiety    Cancer (HCC)    Hypertension     Current Outpatient Medications  Medication Sig Dispense Refill   acetaminophen  (TYLENOL ) 500 MG tablet Take 500 mg by mouth every 6 (six) hours as needed.     amLODipine  (NORVASC ) 10 MG tablet TAKE 1 TABLET BY MOUTH EVERY DAY 90 tablet 1   amoxicillin -clavulanate (AUGMENTIN ) 875-125 MG tablet Take 1 tablet by mouth 2 (two) times daily. 20 tablet 0   benzonatate  (TESSALON ) 100 MG capsule Take 1 capsule (100 mg total) by mouth 3 (three) times daily as needed for cough. 30 capsule 0   chlorpheniramine-HYDROcodone  (TUSSIONEX) 10-8 MG/5ML Take 5 mLs by mouth every 12 (twelve) hours as needed for cough. 115 mL 0   clonazePAM  (KLONOPIN ) 1 MG tablet Take 1-2 mg by mouth 3 (three) times daily as needed. Reported on 10/11/2015     fluticasone  (FLONASE ) 50 MCG/ACT nasal spray Place 2 sprays into  both nostrils daily. 16 g 11   hydrochlorothiazide  (HYDRODIURIL ) 25 MG tablet TAKE 1 TABLET (25 MG TOTAL) BY MOUTH DAILY. 90 tablet 1   KLOR-CON  M20 20 MEQ tablet TAKE 1 TABLET BY MOUTH EVERY DAY 90 tablet 1   Lidocaine -Salicylic Acid  5-40 % GEL Apply 1 Application topically daily. 60 g 2   ondansetron  (ZOFRAN ) 4 MG tablet Take 1 tablet (4 mg total) by mouth every 8 (eight) hours as needed for nausea or vomiting. 60 tablet 2   pantoprazole  (PROTONIX ) 40 MG tablet TAKE 1 TABLET BY MOUTH EVERY DAY 90 tablet 3   predniSONE  (DELTASONE ) 50 MG tablet Take 1 tablet (50 mg total) by mouth daily with breakfast. 5 tablet 0   prochlorperazine  (COMPAZINE ) 10 MG tablet Take 1 tablet (10 mg total) by mouth every 6 (six) hours as needed for nausea or vomiting. 30 tablet 0   trimethoprim -polymyxin b  (POLYTRIM ) ophthalmic solution Place 1 drop into the right eye in the morning, at noon, in the evening, and at bedtime. For 1-2 weeks or until resolved. 10 mL 0   VENTOLIN  HFA 108 (90 Base) MCG/ACT inhaler Inhale 1-2 puffs into the lungs every 6 (six) hours as needed for wheezing or shortness of breath. 18 g 2   No current facility-administered medications for this visit.    Allergies  Allergen Reactions   Doxycycline  Nausea And Vomiting   Penicillins  Hives    Family History  Problem Relation Age of Onset   Diabetes Mother    Heart failure Father    Kidney failure Father     Social History   Socioeconomic History   Marital status: Widowed    Spouse name: Not on file   Number of children: Not on file   Years of education: Not on file   Highest education level: Not on file  Occupational History    Comment: part time  Tobacco Use   Smoking status: Some Days    Current packs/day: 0.15    Types: Cigarettes   Smokeless tobacco: Never   Tobacco comments:    1 PACK A WEEK  Vaping Use   Vaping status: Never Used  Substance and Sexual Activity   Alcohol use: No   Drug use: No   Sexual activity: Yes     Birth control/protection: Post-menopausal  Other Topics Concern   Not on file  Social History Narrative   Not on file   Social Drivers of Health   Financial Resource Strain: Medium Risk (06/23/2022)   Overall Financial Resource Strain (CARDIA)    Difficulty of Paying Living Expenses: Somewhat hard  Food Insecurity: No Food Insecurity (06/23/2022)   Hunger Vital Sign    Worried About Running Out of Food in the Last Year: Never true    Ran Out of Food in the Last Year: Never true  Transportation Needs: No Transportation Needs (06/23/2022)   PRAPARE - Administrator, Civil Service (Medical): No    Lack of Transportation (Non-Medical): No  Physical Activity: Inactive (06/23/2022)   Exercise Vital Sign    Days of Exercise per Week: 0 days    Minutes of Exercise per Session: 0 min  Stress: No Stress Concern Present (06/23/2022)   [employer] of Occupational Health - Occupational Stress Questionnaire    Feeling of Stress : Only a little  Recent Concern: Stress - Stress Concern Present (04/07/2022)   [employer] of Occupational Health - Occupational Stress Questionnaire    Feeling of Stress : Very much  Social Connections: Moderately Isolated (04/07/2022)   Social Connection and Isolation Panel [NHANES]    Frequency of Communication with Friends and Family: More than three times a week    Frequency of Social Gatherings with Friends and Family: More than three times a week    Attends Religious Services: 1 to 4 times per year    Active Member of [employer] or Organizations: No    Attends Banker Meetings: Never    Marital Status: Widowed  Intimate Partner Violence: Not At Risk (06/23/2022)   Humiliation, Afraid, Rape, and Kick questionnaire    Fear of Current or Ex-Partner: No    Emotionally Abused: No    Physically Abused: No    Sexually Abused: No     Constitutional: Pt reports headache. Denies fever, malaise, fatigue, or abrupt weight changes.  HEENT: Pt  reports runny nose and sore throat. Denies eye pain, eye redness, ear pain, ringing in the ears, wax buildup, nasal congestion, bloody nose. Respiratory: Pt reports cough and shortness of breath. Denies difficulty breathing.   Cardiovascular: Denies chest pain, chest tightness, palpitations or swelling in the hands or feet.  Gastrointestinal: Pt reports nausea. Denies abdominal pain, bloating, constipation, diarrhea or blood in the stool.  Musculoskeletal: Denies decrease in range of motion, difficulty with gait, muscle pain or joint pain and swelling.  Skin: Denies redness, rashes, lesions or ulcercations.  Neurological: Denies dizziness, difficulty with memory, difficulty with speech or problems with balance and coordination.   No other specific complaints in a complete review of systems (except as listed in HPI above).      Objective:   Physical Exam  BP 112/62 (BP Location: Left Arm, Patient Position: Sitting, Cuff Size: Normal)   Pulse 77   Temp 98.3 F (36.8 C)   Ht 5' 4 (1.626 m)   Wt 160 lb 6.4 oz (72.8 kg)   SpO2 98%   BMI 27.53 kg/m   Wt Readings from Last 3 Encounters:  03/12/23 161 lb (73 kg)  03/06/23 161 lb (73 kg)  01/22/23 165 lb (74.8 kg)    General: Appears her stated age, overweight, in NAD. Skin: Warm, dry and intact. No rashes noted. HEENT: Head: normal shape and size, maxillary and frontal sinus tenderness noted; Eyes: sclera white, no icterus, conjunctiva pink, PERRLA and EOMs intact; Ears: Tm's gray and intact, normal light reflex; Nose: mucosa pink and moist, septum midline; Throat/Mouth: Teeth present, mucosa erythema and moist, + PND, no exudate, lesions or ulcerations noted.  Neck: Bilateral anterior cervical adenopathy noted. Cardiovascular: Normal rate and rhythm.  Pulmonary/Chest: Normal effort and positive vesicular breath sounds. No respiratory distress. No wheezes, rales or ronchi noted.  Neurological: Alert and oriented.   BMET    Component  Value Date/Time   NA 140 04/21/2022 0811   NA 140 08/04/2015 0938   K 3.0 (L) 04/21/2022 0811   CL 104 04/21/2022 0811   CO2 28 04/21/2022 0811   GLUCOSE 113 (H) 04/21/2022 0811   BUN 15 04/21/2022 0811   BUN 17 08/04/2015 0938   CREATININE 0.97 04/21/2022 0811   CREATININE 0.88 08/17/2016 0939   CALCIUM 9.3 04/21/2022 0811   GFRNONAA >60 04/21/2022 0811   GFRAA 74 08/04/2015 0938    Lipid Panel  No results found for: CHOL, TRIG, HDL, CHOLHDL, VLDL, LDLCALC  CBC    Component Value Date/Time   WBC 10.2 01/22/2023 1026   RBC 5.12 (H) 01/22/2023 1026   HGB 14.4 01/22/2023 1026   HGB 14.5 07/26/2015 1358   HCT 43.2 01/22/2023 1026   HCT 42.6 07/26/2015 1358   PLT 251 01/22/2023 1026   PLT 3 (<) 07/26/2015 1358   MCV 84.4 01/22/2023 1026   MCV 85 07/26/2015 1358   MCH 28.1 01/22/2023 1026   MCHC 33.3 01/22/2023 1026   RDW 14.8 01/22/2023 1026   RDW 15.3 07/26/2015 1358   LYMPHSABS 1.4 01/22/2023 1026   LYMPHSABS 1.7 07/26/2015 1358   MONOABS 0.5 01/22/2023 1026   EOSABS 1.7 (H) 01/22/2023 1026   EOSABS 0.4 07/26/2015 1358   BASOSABS 0.0 01/22/2023 1026   BASOSABS 0.1 07/26/2015 1358    Hgb A1C No results found for: HGBA1C          Assessment & Plan:  Assessment and Plan    Viral Upper Respiratory Infection with Cough/Viral Sinusitis Symptoms of headache, sinus pressure, rhinorrhea with colored discharge, sore throat, and productive cough for the past 3 days. No signs of pneumonia or bronchitis on examination. Negative for COVID and flu. Likely viral etiology. - Prescribe Tessalon  Perles 100 mg 3 times daily.  (cough tablets) for symptomatic relief of cough. - Prescribe a 6-day course of oral steroids for inflammation and symptom relief. - Advise rest and hydration. - If symptoms worsen over the weekend, consider urgent care. If not improved by next week, re-evaluate for possible bacterial infection and need for antibiotics.  Follow-up  with your PCP as previously scheduled Angeline Laura, NP

## 2023-06-05 ENCOUNTER — Ambulatory Visit: Payer: Self-pay | Admitting: *Deleted

## 2023-06-05 MED ORDER — AZITHROMYCIN 250 MG PO TABS: ORAL_TABLET | ORAL | 0 refills | Status: DC

## 2023-06-05 NOTE — Telephone Encounter (Signed)
  Chief Complaint: URI FU Symptoms: cough with congestion, yellow brown thick mucus, HA, body aches, chills  Frequency: 2 weeks  Pertinent Negatives: Patient denies SOB Disposition: [] ED /[] Urgent Care (no appt availability in office) / [] Appointment(In office/virtual)/ []  [facility]/ [] Home Care/ [] Refused Recommended Disposition /[] [facility]/ []  Follow-up with PCP Additional Notes: pt had OV 06/01/23 with Rene Kocher, NP. Was given prednisone and benzonatate. Pt states hasn't helped with sx and sx worse to where she has missed work today d/t feeling so bad. Pt is asking if an abx can be sent to [facility] for her. Advised pt since she was seen on 06/01/23 would send to PCP for recommendations.   Summary: cough and congestion / rx req   The patient has called and shares that they are continuing to experience cough, congestion and headache  The patient shares that their mucus is dark brown/yellow in color  The patient has experienced some nausea as well and feels unable to go to work  The patient would like to be prescribed an antibiotic to help with their discomfort  Please contact the patient further when possible     Reason for Disposition  [1] Sinus congestion (pressure, fullness) AND [2] present > 10 days  Answer Assessment - Initial Assessment Questions 1. ONSET: "When did the nasal discharge start?"      Going on 2 weeks  2. AMOUNT: "How much discharge is there?"      A lot  3. COUGH: "Do you have a cough?" If Yes, ask: "Describe the color of your sputum" (clear, white, yellow, green)     Yes yellow/brown thick  4. RESPIRATORY DISTRESS: "Describe your breathing."      no 5. FEVER: "Do you have a fever?" If Yes, ask: "What is your temperature, how was it measured, and when did it start?"     No chills  6. SEVERITY: "Overall, how bad are you feeling right now?" (e.g., doesn't interfere with normal activities, staying home from school/work, staying in bed)      Not  improving  7. OTHER SYMPTOMS: "Do you have any other symptoms?" (e.g., sore throat, earache, wheezing, vomiting)     Runny nose, body aches, a lot of congestion  Protocols used: Common Cold-A-AH

## 2023-06-05 NOTE — Telephone Encounter (Signed)
Patient notified medication sent to pharmacy

## 2023-06-05 NOTE — Addendum Note (Signed)
Addended by: [name] on: 06/05/2023 11:42 AM   Modules accepted: Orders

## 2023-06-05 NOTE — Telephone Encounter (Signed)
Azithromycin sent to pharmacy

## 2023-06-05 NOTE — Telephone Encounter (Signed)
Summary: cough and congestion / rx req   The patient has called and shares that they are continuing to experience cough, congestion and headache  The patient shares that their mucus is dark brown/yellow in color  The patient has experienced some nausea as well and feels unable to go to work  The patient would like to be prescribed an antibiotic to help with their discomfort  Please contact the patient further when possible     Attempted to contact patient- no answer- left message to call office

## 2023-06-05 NOTE — Telephone Encounter (Signed)
Second attempt to call patient- no answer- left message on VM to call office

## 2023-06-07 ENCOUNTER — Ambulatory Visit: Payer: Self-pay | Admitting: *Deleted

## 2023-06-07 NOTE — Telephone Encounter (Signed)
Spoke with patient made an appointment. Explained we can not prescribe amoxicillin due to allergies.

## 2023-06-07 NOTE — Telephone Encounter (Signed)
She was given prednisone and Tessalon Perles.  She was not given a Z-Pak.  I can send in antibiotics (amoxicillin) to see if that would help her symptoms if she would like.  Otherwise would recommend reevaluation if symptoms persist or worsen.

## 2023-06-07 NOTE — Telephone Encounter (Signed)
See message below

## 2023-06-07 NOTE — Telephone Encounter (Addendum)
  Chief Complaint: Wheezing started a couple of days ago.   On day 2 of Z Pak but no better.  Seen in office on 06/01/2023  by Nicki Reaper, NP.  Still coughing and having congestion.    Symptoms: Coughing up tan mucus, body aches, Z Pak not helping.  Usually gets Amoxicillin and prednisone which helps her a lot better.   Needs a note for work.  Still too weak from not eating and having a lot of nausea. Frequency: Wheezing started a couple of days ago.     Pertinent Negatives: Patient denies feeling any better Disposition: [] ED /[] Urgent Care (no appt availability in office) / [] Appointment(In office/virtual)/ []  [facility]/ [] Home Care/ [] Refused Recommended Disposition /[] [facility]/ [x]  Follow-up with PCP Additional Notes: Message sent to Dr. [name].   Pt. Agreeable to someone calling her back with his recommendation.  Ok to send work note via [employer].

## 2023-06-07 NOTE — Telephone Encounter (Signed)
Reason for Disposition  Continuous (nonstop) coughing interferes with work, school, or sleeping    Wheezing too  Answer Assessment - Initial Assessment Questions 1. RESPIRATORY STATUS: "Describe your breathing?" (e.g., wheezing, shortness of breath, unable to speak, severe coughing)      Seen on 06/01/2023.   I'm only 2 days into the Z Pak.  This doesn't normally help me.    I'm usually put on Amoxicillin and prednisone that really helps me.     I need a note for work.    Can send the note via MyChart is fine.  I've not been to work and need a note.     I'm still having this wheezing.   I'm coughing up tan colored mucus.    Last night I was hot and cold.  Body aches.   I'm still in bed.   I felt like I was going to pass out this morning when I got up and was going to try and go to work.   I'm so weak.    I've not been eating because I'm so nauseas.   This started this week the nausea.    2. ONSET: "When did this breathing problem begin?"        A day before I was seen on 2/7 is when this illness started.    The wheezing just started a couple of days ago.   No asthma.   3. PATTERN "Does the difficult breathing come and go, or has it been constant since it started?"      Constant now for the last couple of days. 4. SEVERITY: "How bad is your breathing?" (e.g., mild, moderate, severe)    - MILD: No SOB at rest, mild SOB with walking, speaks normally in sentences, can lie down, no retractions, pulse < 100.    - MODERATE: SOB at rest, SOB with minimal exertion and prefers to sit, cannot lie down flat, speaks in phrases, mild retractions, audible wheezing, pulse 100-120.    - SEVERE: Very SOB at rest, speaks in single words, struggling to breathe, sitting hunched forward, retractions, pulse > 120      Mild 5. RECURRENT SYMPTOM: "Have you had difficulty breathing before?" If Yes, ask: "When was the last time?" and "What happened that time?"      Not asked 6. CARDIAC HISTORY: "Do you have any history of  heart disease?" (e.g., heart attack, angina, bypass surgery, angioplasty)      Not asked 7. LUNG HISTORY: "Do you have any history of lung disease?"  (e.g., pulmonary embolus, asthma, emphysema)     No 8. CAUSE: "What do you think is causing the breathing problem?"      URI 9. OTHER SYMPTOMS: "Do you have any other symptoms? (e.g., dizziness, runny nose, cough, chest pain, fever)     See above 10. O2 SATURATION MONITOR:  "Do you use an oxygen saturation monitor (pulse oximeter) at home?" If Yes, ask: "What is your reading (oxygen level) today?" "What is your usual oxygen saturation reading?" (e.g., 95%)       Not asked 11. PREGNANCY: "Is there any chance you are pregnant?" "When was your last menstrual period?"       N/A due to age 9. TRAVEL: "Have you traveled out of the country in the last month?" (e.g., travel history, exposures)       Not asked  Protocols used: Breathing Difficulty-A-AH

## 2023-06-07 NOTE — Telephone Encounter (Signed)
After further review of her chart, she was sent in a Z-Pak a few days after her visit because she was not feeling any better.  She has an allergy to penicillin so I cannot send in amoxicillin that she is requesting.  If she is no better after antibiotics, steroids and cough tablets, would recommend follow-up for further evaluation.  Okay for work note to extend until Monday.

## 2023-06-08 ENCOUNTER — Telehealth (INDEPENDENT_AMBULATORY_CARE_PROVIDER_SITE_OTHER): Payer: Medicare HMO | Admitting: Family Medicine

## 2023-06-08 ENCOUNTER — Encounter: Payer: Self-pay | Admitting: Family Medicine

## 2023-06-08 DIAGNOSIS — J0110 Acute frontal sinusitis, unspecified: Secondary | ICD-10-CM

## 2023-06-08 DIAGNOSIS — R0989 Other specified symptoms and signs involving the circulatory and respiratory systems: Secondary | ICD-10-CM | POA: Diagnosis not present

## 2023-06-08 DIAGNOSIS — J9801 Acute bronchospasm: Secondary | ICD-10-CM

## 2023-06-08 MED ORDER — HYDROCOD POLI-CHLORPHE POLI ER 10-8 MG/5ML PO SUER
5.0000 mL | Freq: Two times a day (BID) | ORAL | 0 refills | Status: DC | PRN
Start: 2023-06-08 — End: 2023-08-16

## 2023-06-08 MED ORDER — AMOXICILLIN-POT CLAVULANATE 875-125 MG PO TABS: 1.0000 | ORAL_TABLET | Freq: Two times a day (BID) | ORAL | 0 refills | Status: DC

## 2023-06-08 MED ORDER — PREDNISONE 50 MG PO TABS: 50.0000 mg | ORAL_TABLET | Freq: Every day | ORAL | 0 refills | Status: DC

## 2023-06-08 NOTE — Patient Instructions (Addendum)
Start Augmentin Extended Prednisone Added cough syrup   Please schedule a Follow-up Appointment to: Return if symptoms worsen or fail to improve.  If you have any other questions or concerns, please feel free to call the office or send a message through MyChart. You may also schedule an earlier appointment if necessary.  Additionally, you may be receiving a survey about your experience at our office within a few days to 1 week by e-mail or mail. We value your feedback.  Saralyn Pilar, DO [facility], [place]

## 2023-06-08 NOTE — Progress Notes (Signed)
Subjective:    Patient ID: Erica Bartlett, female    DOB: Sep 05, 1955, 68 y.o.   MRN: [id number]  Erica Bartlett is a 68 y.o. female presenting on 06/08/2023 for Sinusitis   Virtual / Telehealth Encounter - Video Visit via MyChart The purpose of this virtual visit is to provide medical care while limiting exposure to the novel coronavirus (COVID19) for both patient and office staff.  Consent was obtained for remote visit:  Yes.   Answered questions that patient had about telehealth interaction:  Yes.   I discussed the limitations, risks, security and privacy concerns of performing an evaluation and management service by video/telephone. I also discussed with the patient that there may be a patient responsible charge related to this service. The patient expressed understanding and agreed to proceed.  Patient Location: Home Provider Location: [facility] (Office)  Participants in virtual visit: - Patient: Erica Bartlett - CMA: Fuller Plan CMA - Provider: Dr [name]   HPI  Discussed the use of AI scribe software for clinical note transcription with the patient, who gave verbal consent to proceed.  History of Present Illness   [name] is a 68 year old female who presents with sinus congestion and cough.  She has been experiencing sinus congestion and cough that began around Wednesday night or Thursday morning last week. The congestion is characterized by thicker, green to dark brown mucus, which she expels by both blowing her nose and coughing.  She had apt 1 week ago at our office. She has previously been treated with a Z-Pak, which has never been effective for her. She has also used cough pearls, although her insurance does not cover them. In the past, she has been treated with Augmentin, a high-dose steroid taper, and hydrocodone cough syrup, which she found effective in managing her symptoms. She tolerates Augmentin well despite a  history of hive reactions to penicillins.  She has been unable to work for over a week due to these symptoms, which have significantly impacted her daily activities. She mentions wearing a mask at work to manage her symptoms and prevent spreading illness. She wants to return to work and requests a work note to cover her absence from Monday through Friday of the current week.          06/01/2023    1:16 PM 07/21/2022    2:27 PM 06/23/2022    9:31 AM  Depression screen PHQ 2/9  Decreased Interest 2 1 0  Down, Depressed, Hopeless 2 1 1   PHQ - 2 Score 4 2 1   Altered sleeping 3 3 1   Tired, decreased energy 3 3 1   Change in appetite 1 2 0  Feeling bad or failure about yourself  0 0 0  Trouble concentrating 0 1 0  Moving slowly or fidgety/restless 0 1 0  Suicidal thoughts 0 0 0  PHQ-9 Score 11 12 3   Difficult doing work/chores Somewhat difficult Not difficult at all Not difficult at all       06/01/2023    1:16 PM 07/21/2022    2:28 PM 03/30/2022   12:13 PM 08/10/2021    2:43 PM  GAD 7 : Generalized Anxiety Score  Nervous, Anxious, on Edge 2 2 1 2   Control/stop worrying 1 1 1 1   Worry too much - different things 2 1 1 1   Trouble relaxing 2 2 1 1   Restless 1 1 1  0  Easily annoyed or irritable 1 1 1  1  Afraid - awful might happen 1 0 1 1  Total GAD 7 Score 10 8 7 7   Anxiety Difficulty Somewhat difficult Somewhat difficult Somewhat difficult Not difficult at all    Social History   Tobacco Use   Smoking status: Some Days    Current packs/day: 0.15    Types: Cigarettes   Smokeless tobacco: Never   Tobacco comments:    1 PACK A WEEK  Vaping Use   Vaping status: Never Used  Substance Use Topics   Alcohol use: No   Drug use: No    Review of Systems Per HPI unless specifically indicated above     Objective:    There were no vitals taken for this visit.  Wt Readings from Last 3 Encounters:  06/01/23 160 lb 6.4 oz (72.8 kg)  03/12/23 161 lb (73 kg)  03/06/23 161 lb (73 kg)      Physical Exam  Note examination was completely remotely via video observation objective data only  Gen - well-appearing, no acute distress or apparent pain, comfortable HEENT - eyes appear clear without discharge or redness Heart/Lungs - cannot examine virtually - observed occasional cough Abd - cannot examine virtually  Skin - face visible today- no rash Neuro - awake, alert, oriented Psych - not anxious appearing   Results for orders placed or performed in visit on 06/01/23  POC Covid19/Flu A&B Antigen   Collection Time: 06/01/23  1:18 PM  Result Value Ref Range   Influenza A Antigen, POC Negative Negative   Influenza B Antigen, POC Negative Negative   Covid Antigen, POC Negative Negative      Assessment & Plan:   Problem List Items Addressed This Visit   None Visit Diagnoses       Acute non-recurrent frontal sinusitis    -  Primary   Relevant Medications   amoxicillin-clavulanate (AUGMENTIN) 875-125 MG tablet   predniSONE (DELTASONE) 50 MG tablet   chlorpheniramine-HYDROcodone (TUSSIONEX) 10-8 MG/5ML     Chest congestion       Relevant Medications   chlorpheniramine-HYDROcodone (TUSSIONEX) 10-8 MG/5ML     Bronchospasm, acute       Relevant Medications   chlorpheniramine-HYDROcodone (TUSSIONEX) 10-8 MG/5ML        Sinusitis Persistent symptoms for over a week with thick, green to dark brown congestion. Prior treatment with Z-Pak and cough pearls was ineffective. Patient has a history of responding well to Augmentin, high dose steroids, and hydrocodone cough syrup. -Start Augmentin. (Note she can tolerate with reaction) -Start high dose steroids Prednisone (50mg  for 5 days). Note she finished taper previously -Start hydrocodone cough syrup. Tussionex -Check in early next week to assess response to treatment.  Work Excuse Patient has been unable to work due to illness. -Provide work excuse note for the period from Monday to Friday, with a return to work date  of the following Monday 2/17        No orders of the defined types were placed in this encounter.   Meds ordered this encounter  Medications   amoxicillin-clavulanate (AUGMENTIN) 875-125 MG tablet    Sig: Take 1 tablet by mouth 2 (two) times daily.    Dispense:  20 tablet    Refill:  0   predniSONE (DELTASONE) 50 MG tablet    Sig: Take 1 tablet (50 mg total) by mouth daily with breakfast.    Dispense:  5 tablet    Refill:  0   chlorpheniramine-HYDROcodone (TUSSIONEX) 10-8 MG/5ML    Sig:  Take 5 mLs by mouth every 12 (twelve) hours as needed for cough.    Dispense:  115 mL    Refill:  0    Follow up plan: Return if symptoms worsen or fail to improve.   Patient verbalizes understanding with the above medical recommendations including the limitation of remote medical advice.  Specific follow-up and call-back criteria were given for patient to follow-up or seek medical care more urgently if needed.  Total duration of direct patient care provided via video conference: 10 minutes   Saralyn Pilar, DO [facility] 06/08/2023, 11:32 AM

## 2023-06-19 ENCOUNTER — Encounter: Payer: Self-pay | Admitting: Oncology

## 2023-06-29 ENCOUNTER — Ambulatory Visit: Payer: Medicaid Other

## 2023-06-29 ENCOUNTER — Encounter: Payer: Self-pay | Admitting: Oncology

## 2023-06-29 ENCOUNTER — Ambulatory Visit (INDEPENDENT_AMBULATORY_CARE_PROVIDER_SITE_OTHER): Payer: Medicare HMO | Admitting: Podiatry

## 2023-06-29 ENCOUNTER — Encounter: Payer: Self-pay | Admitting: Podiatry

## 2023-06-29 DIAGNOSIS — Z0000 Encounter for general adult medical examination without abnormal findings: Secondary | ICD-10-CM

## 2023-06-29 DIAGNOSIS — B351 Tinea unguium: Secondary | ICD-10-CM | POA: Diagnosis not present

## 2023-06-29 DIAGNOSIS — Z599 Problem related to housing and economic circumstances, unspecified: Secondary | ICD-10-CM | POA: Diagnosis not present

## 2023-06-29 DIAGNOSIS — M79674 Pain in right toe(s): Secondary | ICD-10-CM

## 2023-06-29 DIAGNOSIS — D2371 Other benign neoplasm of skin of right lower limb, including hip: Secondary | ICD-10-CM | POA: Diagnosis not present

## 2023-06-29 DIAGNOSIS — Z1211 Encounter for screening for malignant neoplasm of colon: Secondary | ICD-10-CM

## 2023-06-29 DIAGNOSIS — Z1231 Encounter for screening mammogram for malignant neoplasm of breast: Secondary | ICD-10-CM

## 2023-06-29 DIAGNOSIS — M79675 Pain in left toe(s): Secondary | ICD-10-CM | POA: Diagnosis not present

## 2023-06-29 DIAGNOSIS — Z780 Asymptomatic menopausal state: Secondary | ICD-10-CM

## 2023-06-29 NOTE — Progress Notes (Signed)
 Subjective:   Erica Bartlett is a 69 y.o. who presents for a Medicare Wellness preventive visit.  Visit Complete: Virtual I connected with  Erica Bartlett on 06/29/23 by a audio enabled telemedicine application and verified that I am speaking with the correct person using two identifiers.  Patient Location: Home  Provider Location: Office/Clinic  I discussed the limitations of evaluation and management by telemedicine. The patient expressed understanding and agreed to proceed.  Vital Signs: Because this visit was a virtual/telehealth visit, some criteria may be missing or patient reported. Any vitals not documented were not able to be obtained and vitals that have been documented are patient reported.  VideoError- Librarian, academic were attempted between this provider and patient, however failed, due to patient having technical difficulties OR patient did not have access to video capability.  We continued and completed visit with audio only.   AWV Questionnaire: No: Patient Medicare AWV questionnaire was not completed prior to this visit.  Cardiac Risk Factors include: advanced age (>68men, >14 women);hypertension;smoking/ tobacco exposure     Objective:    There were no vitals filed for this visit. There is no height or weight on file to calculate BMI.     06/29/2023    9:39 AM 01/22/2023   10:44 AM 07/21/2022   10:50 AM 06/23/2022    9:33 AM 04/21/2022    8:44 AM 04/14/2022    9:17 AM 04/07/2022    8:39 AM  Advanced Directives  Does Patient Have a Medical Advance Directive? No No No No Yes Yes Yes  Type of Furniture conservator/restorer;Living will   Healthcare Power of [place];Living will Healthcare Power of [place];Living will   Does patient want to make changes to medical advance directive?   No - Patient declined   No - Patient declined   Copy of Healthcare Power of Attorney in Chart?      No - copy requested   Would  patient like information on creating a medical advance directive? No - Patient declined   No - Patient declined       Current Medications (verified) Outpatient Encounter Medications as of 06/29/2023  Medication Sig   acetaminophen (TYLENOL) 500 MG tablet Take 500 mg by mouth every 6 (six) hours as needed.   amLODipine (NORVASC) 10 MG tablet TAKE 1 TABLET BY MOUTH EVERY DAY   citalopram (CELEXA) 40 MG tablet Take 40 mg by mouth daily.   clonazePAM (KLONOPIN) 1 MG tablet Take 1-2 mg by mouth 3 (three) times daily as needed. Reported on 10/11/2015   fluticasone (FLONASE) 50 MCG/ACT nasal spray Place 2 sprays into both nostrils daily.   hydrochlorothiazide (HYDRODIURIL) 25 MG tablet TAKE 1 TABLET (25 MG TOTAL) BY MOUTH DAILY.   Lidocaine-Salicylic Acid 5-40 % GEL Apply 1 Application topically daily.   ondansetron (ZOFRAN) 4 MG tablet Take 1 tablet (4 mg total) by mouth every 8 (eight) hours as needed for nausea or vomiting.   pantoprazole (PROTONIX) 40 MG tablet TAKE 1 TABLET BY MOUTH EVERY DAY   prochlorperazine (COMPAZINE) 10 MG tablet Take 1 tablet (10 mg total) by mouth every 6 (six) hours as needed for nausea or vomiting.   trimethoprim-polymyxin b (POLYTRIM) ophthalmic solution Place 1 drop into the right eye in the morning, at noon, in the evening, and at bedtime. For 1-2 weeks or until resolved.   VENTOLIN HFA 108 (90 Base) MCG/ACT inhaler Inhale 1-2 puffs into the lungs every 6 (  six) hours as needed for wheezing or shortness of breath.   amoxicillin-clavulanate (AUGMENTIN) 875-125 MG tablet Take 1 tablet by mouth 2 (two) times daily. (Patient not taking: Reported on 06/29/2023)   azithromycin (ZITHROMAX) 250 MG tablet Take 2 tabs today, then 1 tab daily x 4 days (Patient not taking: Reported on 06/29/2023)   benzonatate (TESSALON) 100 MG capsule Take 1 capsule (100 mg total) by mouth 2 (two) times daily as needed for cough. (Patient not taking: Reported on 06/29/2023)   chlorpheniramine-HYDROcodone  (TUSSIONEX) 10-8 MG/5ML Take 5 mLs by mouth every 12 (twelve) hours as needed for cough. (Patient not taking: Reported on 06/29/2023)   KLOR-CON M20 20 MEQ tablet TAKE 1 TABLET BY MOUTH EVERY DAY (Patient not taking: Reported on 06/29/2023)   predniSONE (DELTASONE) 50 MG tablet Take 1 tablet (50 mg total) by mouth daily with breakfast. (Patient not taking: Reported on 06/29/2023)   No facility-administered encounter medications on file as of 06/29/2023.    Allergies (verified) Doxycycline and Penicillins   History: Past Medical History:  Diagnosis Date   Anxiety    Cancer (HCC)    Hypertension    Past Surgical History:  Procedure Laterality Date   ABDOMINAL HYSTERECTOMY     BREAST BIOPSY Left    age 46 surgical bx neg   CESAREAN SECTION     cyst removed     breast, benign   Family History  Problem Relation Age of Onset   Diabetes Mother    Heart failure Father    Kidney failure Father    Social History   Socioeconomic History   Marital status: Widowed    Spouse name: Not on file   Number of children: Not on file   Years of education: Not on file   Highest education level: Not on file  Occupational History    Comment: part time  Tobacco Use   Smoking status: Some Days    Current packs/day: 0.15    Types: Cigarettes   Smokeless tobacco: Never   Tobacco comments:    1 PACK A WEEK  Vaping Use   Vaping status: Never Used  Substance and Sexual Activity   Alcohol use: No   Drug use: No   Sexual activity: Yes    Birth control/protection: Post-menopausal  Other Topics Concern   Not on file  Social History Narrative   Not on file   Social Drivers of Health   Financial Resource Strain: Medium Risk (06/29/2023)   Overall Financial Resource Strain (CARDIA)    Difficulty of Paying Living Expenses: Somewhat hard  Food Insecurity: Food Insecurity Present (06/29/2023)   Hunger Vital Sign    Worried About Running Out of Food in the Last Year: Sometimes true    Ran Out of Food  in the Last Year: Sometimes true  Transportation Needs: No Transportation Needs (06/29/2023)   PRAPARE - Administrator, Civil Service (Medical): No    Lack of Transportation (Non-Medical): No  Physical Activity: Sufficiently Active (06/29/2023)   Exercise Vital Sign    Days of Exercise per Week: 5 days    Minutes of Exercise per Session: 60 min  Stress: Stress Concern Present (06/29/2023)   [employer] of Occupational Health - Occupational Stress Questionnaire    Feeling of Stress : To some extent  Social Connections: Moderately Isolated (06/29/2023)   Social Connection and Isolation Panel [NHANES]    Frequency of Communication with Friends and Family: More than three times a week  Frequency of Social Gatherings with Friends and Family: Never    Attends Religious Services: Never    Database administrator or Organizations: Yes    Attends Engineer, structural: More than 4 times per year    Marital Status: Widowed    Tobacco Counseling Ready to quit: Not Answered Counseling given: Not Answered Tobacco comments: 1 PACK A WEEK    Clinical Intake:  Pre-visit preparation completed: Yes        BMI - recorded: 27.5 Nutritional Status: BMI 25 -29 Overweight Nutritional Risks: None Diabetes: No  How often do you need to have someone help you when you read instructions, pamphlets, or other written materials from your doctor or pharmacy?: 1 - Never  Interpreter Needed?: No  Information entered by :: Kennedy Bucker, LPN   Activities of Daily Living     06/29/2023    9:40 AM  In your present state of health, do you have any difficulty performing the following activities:  Hearing? 0  Vision? 0  Difficulty concentrating or making decisions? 0  Walking or climbing stairs? 1  Comment IF LONG FLIGHT OF STAIRS  Dressing or bathing? 0  Doing errands, shopping? 0  Preparing Food and eating ? N  Using the Toilet? N  In the past six months, have you accidently  leaked urine? N  Do you have problems with loss of bowel control? N  Managing your Medications? N  Managing your Finances? N  Housekeeping or managing your Housekeeping? N    Patient Care Team: Smitty Cords, DO as PCP - General (Family Medicine) Milagros Evener, MD as Consulting Physician (Psychiatry) [name], MD as Consulting Physician (Oncology) Gustavus Bryant, LCSW as Social Worker (Licensed Clinical Social Worker) Irene Limbo., MD (Ophthalmology)  Indicate any recent Medical Services you may have received from other than [facility] providers in the past year (date may be approximate).     Assessment:   This is a routine wellness examination for Naomie.  Hearing/Vision screen Hearing Screening - Comments:: NO AIDS Vision Screening - Comments:: WEARS CONTACTS EVERY DAY- [email or web address]   Goals Addressed             This Visit's Progress    Cut out extra servings         Depression Screen     06/29/2023    9:35 AM 06/01/2023    1:16 PM 07/21/2022    2:27 PM 06/23/2022    9:31 AM 04/07/2022   12:49 PM 03/30/2022   12:13 PM 08/10/2021    2:43 PM  PHQ 2/9 Scores  PHQ - 2 Score 0 4 2 1 3 2  0  PHQ- 9 Score 0 11 12 3 9 4 9     Fall Risk     06/29/2023    9:40 AM 06/01/2023    1:16 PM 07/21/2022    2:27 PM 06/23/2022    9:33 AM 03/30/2022   12:13 PM  Fall Risk   Falls in the past year? 0 0 0 0 0  Number falls in past yr: 0  0 0 0  Injury with Fall? 0  0 0 0  Risk for fall due to : No Fall Risks  No Fall Risks No Fall Risks No Fall Risks  Follow up Falls prevention discussed;Falls evaluation completed  Falls evaluation completed Falls prevention discussed;Falls evaluation completed Falls evaluation completed    MEDICARE RISK AT HOME:   Medicare Risk at Home Any stairs in or around  the home?: Yes If so, are there any without handrails?: No Home free of loose throw rugs in walkways, pet beds, electrical cords, etc?: Yes Adequate lighting in your home to reduce  risk of falls?: Yes Life alert?: No Use of a cane, walker or w/c?: No Grab bars in the bathroom?: Yes Shower chair or bench in shower?: Yes Elevated toilet seat or a handicapped toilet?: No  TIMED UP AND GO:  Was the test performed?  No  Cognitive Function: 6CIT completed        06/29/2023    9:42 AM 06/23/2022    9:39 AM 09/18/2017    8:43 AM  6CIT Screen  What Year? 0 points 0 points 0 points  What month? 0 points 0 points 0 points  What time? 0 points 0 points 0 points  Count back from 20 0 points 0 points 0 points  Months in reverse 0 points 0 points 0 points  Repeat phrase 0 points 0 points 0 points  Total Score 0 points 0 points 0 points    Immunizations Immunization History  Administered Date(s) Administered   Influenza,inj,Quad PF,6+ Mos 02/18/2015, 01/31/2019, 02/08/2023   Moderna Sars-Covid-2 Vaccination 06/09/2019, 07/05/2019    Screening Tests Health Maintenance  Topic Date Due   Pneumonia Vaccine 79+ Years old (1 of 2 - PCV) Never done   Hepatitis C Screening  Never done   DTaP/Tdap/Td (1 - Tdap) Never done   Zoster Vaccines- Shingrix (1 of 2) Never done   Cervical Cancer Screening (HPV/Pap Cotest)  Never done   Colonoscopy  Never done   MAMMOGRAM  09/13/2018   COVID-19 Vaccine (3 - Moderna risk series) 08/02/2019   DEXA SCAN  Never done   Medicare Annual Wellness (AWV)  06/28/2024   INFLUENZA VACCINE  Completed   HIV Screening  Completed   HPV VACCINES  Aged Out    Health Maintenance  Health Maintenance Due  Topic Date Due   Pneumonia Vaccine 64+ Years old (1 of 2 - PCV) Never done   Hepatitis C Screening  Never done   DTaP/Tdap/Td (1 - Tdap) Never done   Zoster Vaccines- Shingrix (1 of 2) Never done   Cervical Cancer Screening (HPV/Pap Cotest)  Never done   Colonoscopy  Never done   MAMMOGRAM  09/13/2018   COVID-19 Vaccine (3 - Moderna risk series) 08/02/2019   DEXA SCAN  Never done   Health Maintenance Items Addressed: Referral sent to  GI for colonoscopy REFERRAL SENT FOR MAMMOGRAM REFERRAL SENT FOR DEXA  Additional Screening:  Vision Screening: Recommended annual ophthalmology exams for early detection of glaucoma and other disorders of the eye.  Dental Screening: Recommended annual dental exams for proper oral hygiene  Community Resource Referral / Chronic Care Management: CRR required this visit?  No   CCM required this visit?  No     Plan:     I have personally reviewed and noted the following in the patient's chart:   Medical and social history Use of alcohol, tobacco or illicit drugs  Current medications and supplements including opioid prescriptions. Patient is not currently taking opioid prescriptions. Functional ability and status Nutritional status Physical activity Advanced directives List of other physicians Hospitalizations, surgeries, and ER visits in previous 12 months Vitals Screenings to include cognitive, depression, and falls Referrals and appointments  In addition, I have reviewed and discussed with patient certain preventive protocols, quality metrics, and best practice recommendations. A written personalized care plan for preventive services as well as  general preventive health recommendations were provided to patient.     Hal Hope, LPN   11/24/9935   After Visit Summary: (MyChart) Due to this being a telephonic visit, the after visit summary with patients personalized plan was offered to patient via MyChart   Notes:  REFERRALS SENT FOR COLONOSCOPY, MAMMOGRAM, BONE DENSITY SCAN

## 2023-06-29 NOTE — Progress Notes (Signed)
   Chief Complaint  Patient presents with   Callouses    Trim calluses bilateral, also check 2nd toe left-possible ingrown    Subjective: 68 y.o. female presenting to the office today for follow-up evaluation of symptomatic skin lesions to the plantar aspect of the bilateral feet.  Very tender and painful with palpation.  With each debridement they feel better but eventually the lesions return.  Patient also requesting nail trim.  She says that her nails are very tender especially in close toed shoes.  She is unable to trim her own nails.   Past Medical History:  Diagnosis Date   Anxiety    Cancer (HCC)    Hypertension     Past Surgical History:  Procedure Laterality Date   ABDOMINAL HYSTERECTOMY     BREAST BIOPSY Left    age 50 surgical bx neg   CESAREAN SECTION     cyst removed     breast, benign    Allergies  Allergen Reactions   Doxycycline Nausea And Vomiting   Penicillins Hives     Objective:  Physical Exam General: Alert and oriented x3 in no acute distress  Dermatology: Hyperkeratotic lesion(s) present on the bilateral feet. Pain on palpation with a central nucleated core noted. Skin is warm, dry and supple bilateral lower extremities. Negative for open lesions or macerations.  Hyperkeratotic elongated nails noted 1-5 bilateral  Vascular: Palpable pedal pulses bilaterally. No edema or erythema noted. Capillary refill within normal limits.  Neurological: Grossly intact via light touch  Musculoskeletal Exam: Pain on palpation at the keratotic lesion(s) noted. Range of motion within normal limits bilateral. Muscle strength 5/5 in all groups bilateral.  Assessment: 1.  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  Pain due to onychomycosis of toenails both   Plan of Care:  -Patient evaluated -Excisional debridement of keratoic lesion(s) using a chisel blade was performed without incident.  -Salicylic acid applied with a bandaid -Recommend OTC salicylic  acid daily -Mechanical debridement of nails 1-5 bilateral performed using a nail nipper without incident or bleeding -Return to the clinic PRN.   [name], DPM [facility]  Dr. [name], DPM    [address] [place], [place] [address]                Office [phone]  Fax [phone]

## 2023-06-29 NOTE — Patient Instructions (Addendum)
 Erica Bartlett , Thank you for taking time to come for your Medicare Wellness Visit. I appreciate your ongoing commitment to your health goals. Please review the following plan we discussed and let me know if I can assist you in the future.   Referrals/Orders/Follow-Ups/Clinician Recommendations: REFERRALS SENT FOR FINANCIAL ASSISTANCE, COLONOSCOPY, MAMMOGRAM & BONE DENSITY SCAN  You have an order for:  []   2D Mammogram  [x]   3D Mammogram  [x]   Bone Density     Please call for appointment:  [facility]  [address] [place] [address] [phone] [facility] [address] [place], [place] [address] [phone] [facility] [address] [place], [place] [address] [phone]   Make sure to wear two-piece clothing.  No lotions, powders, or deodorants the day of the appointment. Make sure to bring picture ID and insurance card.  Bring list of medications you are currently taking including any supplements.   Schedule your [place] screening mammogram through MyChart!   Log into your MyChart account.  Go to 'Visit' (or 'Appointments' if on mobile App) --> Schedule an Appointment  Under 'Select a Reason for Visit' choose the Mammogram Screening option.  Complete the pre-visit questions and select the time and place that best fits your schedule.   This is a list of the screening recommended for you and due dates:  Health Maintenance  Topic Date Due   Pneumonia Vaccine (1 of 2 - PCV) Never done   Hepatitis C Screening  Never done   DTaP/Tdap/Td vaccine (1 - Tdap) Never done   Zoster (Shingles) Vaccine (1 of 2) Never done   Pap with HPV screening  Never done   Colon Cancer Screening  Never done   Mammogram  09/13/2018   COVID-19 Vaccine (3 - Moderna risk series) 08/02/2019   DEXA scan (bone density measurement)  Never done   Medicare  Annual Wellness Visit  06/28/2024   Flu Shot  Completed   HIV Screening  Completed   HPV Vaccine  Aged Out    Advanced directives: (ACP Link)Information on Advanced Care Planning can be found at [facility] [employer] Advance Health Care Directives Advance Health Care Directives. [email or web address]   Next Medicare Annual Wellness Visit scheduled for next year: Yes   07/04/24 @ 8:50 AM BY PHONE

## 2023-06-30 ENCOUNTER — Other Ambulatory Visit: Payer: Self-pay

## 2023-07-02 ENCOUNTER — Telehealth: Payer: Self-pay

## 2023-07-02 ENCOUNTER — Other Ambulatory Visit: Payer: Self-pay

## 2023-07-02 ENCOUNTER — Encounter: Payer: Self-pay | Admitting: Oncology

## 2023-07-02 DIAGNOSIS — Z1211 Encounter for screening for malignant neoplasm of colon: Secondary | ICD-10-CM

## 2023-07-02 NOTE — Telephone Encounter (Signed)
 Gastroenterology Pre-Procedure Review  Request Date: 08/02/23 Requesting Physician: Dr. Allegra Lai  PATIENT REVIEW QUESTIONS: The patient responded to the following health history questions as indicated:    1. Are you having any GI issues? no 2. Do you have a personal history of Polyps? no 3. Do you have a family history of Colon Cancer or Polyps? no 4. Diabetes Mellitus? no 5. Joint replacements in the past 12 months?no 6. Major health problems in the past 3 months?no 7. Any artificial heart valves, MVP, or defibrillator?no    MEDICATIONS & ALLERGIES:    Patient reports the following regarding taking any anticoagulation/antiplatelet therapy:   Plavix, Coumadin, Eliquis, Xarelto, Lovenox, Pradaxa, Brilinta, or Effient? no Aspirin? no  Patient confirms/reports the following medications:  Current Outpatient Medications  Medication Sig Dispense Refill   acetaminophen (TYLENOL) 500 MG tablet Take 500 mg by mouth every 6 (six) hours as needed.     amLODipine (NORVASC) 10 MG tablet TAKE 1 TABLET BY MOUTH EVERY DAY 90 tablet 1   amoxicillin-clavulanate (AUGMENTIN) 875-125 MG tablet Take 1 tablet by mouth 2 (two) times daily. (Patient not taking: Reported on 06/29/2023) 20 tablet 0   azithromycin (ZITHROMAX) 250 MG tablet Take 2 tabs today, then 1 tab daily x 4 days (Patient not taking: Reported on 06/29/2023) 6 tablet 0   benzonatate (TESSALON) 100 MG capsule Take 1 capsule (100 mg total) by mouth 2 (two) times daily as needed for cough. (Patient not taking: Reported on 06/29/2023) 20 capsule 0   chlorpheniramine-HYDROcodone (TUSSIONEX) 10-8 MG/5ML Take 5 mLs by mouth every 12 (twelve) hours as needed for cough. (Patient not taking: Reported on 06/29/2023) 115 mL 0   citalopram (CELEXA) 40 MG tablet Take 40 mg by mouth daily.     clonazePAM (KLONOPIN) 1 MG tablet Take 1-2 mg by mouth 3 (three) times daily as needed. Reported on 10/11/2015     fluticasone (FLONASE) 50 MCG/ACT nasal spray Place 2 sprays into  both nostrils daily. 16 g 11   hydrochlorothiazide (HYDRODIURIL) 25 MG tablet TAKE 1 TABLET (25 MG TOTAL) BY MOUTH DAILY. 90 tablet 1   KLOR-CON M20 20 MEQ tablet TAKE 1 TABLET BY MOUTH EVERY DAY (Patient not taking: Reported on 06/29/2023) 90 tablet 1   Lidocaine-Salicylic Acid 5-40 % GEL Apply 1 Application topically daily. 60 g 2   ondansetron (ZOFRAN) 4 MG tablet Take 1 tablet (4 mg total) by mouth every 8 (eight) hours as needed for nausea or vomiting. 60 tablet 2   pantoprazole (PROTONIX) 40 MG tablet TAKE 1 TABLET BY MOUTH EVERY DAY 90 tablet 3   predniSONE (DELTASONE) 50 MG tablet Take 1 tablet (50 mg total) by mouth daily with breakfast. (Patient not taking: Reported on 06/29/2023) 5 tablet 0   prochlorperazine (COMPAZINE) 10 MG tablet Take 1 tablet (10 mg total) by mouth every 6 (six) hours as needed for nausea or vomiting. 30 tablet 0   trimethoprim-polymyxin b (POLYTRIM) ophthalmic solution Place 1 drop into the right eye in the morning, at noon, in the evening, and at bedtime. For 1-2 weeks or until resolved. 10 mL 0   VENTOLIN HFA 108 (90 Base) MCG/ACT inhaler Inhale 1-2 puffs into the lungs every 6 (six) hours as needed for wheezing or shortness of breath. 18 g 2   No current facility-administered medications for this visit.    Patient confirms/reports the following allergies:  Allergies  Allergen Reactions   Doxycycline Nausea And Vomiting   Penicillins Hives    No orders  of the defined types were placed in this encounter.   AUTHORIZATION INFORMATION Primary Insurance: 1D#: Group #:  Secondary Insurance: 1D#: Group #:  SCHEDULE INFORMATION: Date: 08/02/23 Time: Location: [facility]

## 2023-07-03 ENCOUNTER — Telehealth: Payer: Self-pay | Admitting: *Deleted

## 2023-07-03 NOTE — Progress Notes (Signed)
 Complex Care Management Note Care Guide Note  07/03/2023 Name: HEDDA CRUMBLEY MRN: [id number] DOB: [birth year]   Complex Care Management Outreach Attempts: An unsuccessful telephone outreach was attempted today to offer the patient information about available complex care management services.  Follow Up Plan:  Additional outreach attempts will be made to offer the patient complex care management information and services.   Encounter Outcome:  No Answer  [facility] HealthPopulation Health Care Guide  Direct Dial:[phone] [phone] Website: [email or web address]

## 2023-07-11 ENCOUNTER — Telehealth: Payer: Self-pay | Admitting: *Deleted

## 2023-07-11 NOTE — Progress Notes (Signed)
 Complex Care Management Note Care Guide Note  07/11/2023 Name: NELLINE LIO MRN: [id number] DOB: 12-06-1955   Complex Care Management Outreach Attempts: A second unsuccessful outreach was attempted today to offer the patient with information about available complex care management services.  Follow Up Plan:  Additional outreach attempts will be made to offer the patient complex care management information and services.   Encounter Outcome:  No Answer [facility] Health[facility] Health Care Guide  Direct Dial:[phone] [phone] Website: [email or web address]

## 2023-07-12 ENCOUNTER — Telehealth: Payer: Self-pay | Admitting: *Deleted

## 2023-07-12 NOTE — Progress Notes (Signed)
 Complex Care Management Note Care Guide Note  07/12/2023 Name: Erica Bartlett MRN: [id number] DOB: 01-04-1956   Complex Care Management Outreach Attempts: A third unsuccessful outreach was attempted today to offer the patient with information about available complex care management services.  Follow Up Plan:  No further outreach attempts will be made at this time. We have been unable to contact the patient to offer or enroll patient in complex care management services.  Encounter Outcome:  No Answer  [facility] HealthPopulation Health Care Guide  Direct Dial:[phone] Fax:[phone] Website: [email or web address]

## 2023-07-23 ENCOUNTER — Ambulatory Visit: Payer: Medicare HMO | Admitting: Oncology

## 2023-07-23 ENCOUNTER — Other Ambulatory Visit: Payer: Medicare HMO

## 2023-07-24 ENCOUNTER — Inpatient Hospital Stay: Payer: Medicare HMO | Attending: Oncology

## 2023-07-24 ENCOUNTER — Encounter: Payer: Self-pay | Admitting: Oncology

## 2023-07-24 ENCOUNTER — Inpatient Hospital Stay (HOSPITAL_BASED_OUTPATIENT_CLINIC_OR_DEPARTMENT_OTHER): Payer: Medicare HMO | Admitting: Oncology

## 2023-07-24 VITALS — BP 133/72 | HR 59 | Temp 97.6°F | Resp 20 | Wt 164.3 lb

## 2023-07-24 DIAGNOSIS — F17210 Nicotine dependence, cigarettes, uncomplicated: Secondary | ICD-10-CM | POA: Insufficient documentation

## 2023-07-24 DIAGNOSIS — D693 Immune thrombocytopenic purpura: Secondary | ICD-10-CM | POA: Diagnosis not present

## 2023-07-24 DIAGNOSIS — M549 Dorsalgia, unspecified: Secondary | ICD-10-CM | POA: Insufficient documentation

## 2023-07-24 DIAGNOSIS — Z79899 Other long term (current) drug therapy: Secondary | ICD-10-CM | POA: Diagnosis not present

## 2023-07-24 DIAGNOSIS — G8929 Other chronic pain: Secondary | ICD-10-CM | POA: Diagnosis not present

## 2023-07-24 LAB — CBC WITH DIFFERENTIAL/PLATELET
Abs Immature Granulocytes: 0.01 10*3/uL (ref 0.00–0.07)
Basophils Absolute: 0 10*3/uL (ref 0.0–0.1)
Basophils Relative: 1 %
Eosinophils Absolute: 0.2 10*3/uL (ref 0.0–0.5)
Eosinophils Relative: 3 %
HCT: 41.6 % (ref 36.0–46.0)
Hemoglobin: 13.7 g/dL (ref 12.0–15.0)
Immature Granulocytes: 0 %
Lymphocytes Relative: 26 %
Lymphs Abs: 1.4 10*3/uL (ref 0.7–4.0)
MCH: 28.4 pg (ref 26.0–34.0)
MCHC: 32.9 g/dL (ref 30.0–36.0)
MCV: 86.1 fL (ref 80.0–100.0)
Monocytes Absolute: 0.3 10*3/uL (ref 0.1–1.0)
Monocytes Relative: 7 %
Neutro Abs: 3.3 10*3/uL (ref 1.7–7.7)
Neutrophils Relative %: 63 %
Platelets: 252 10*3/uL (ref 150–400)
RBC: 4.83 MIL/uL (ref 3.87–5.11)
RDW: 14.9 % (ref 11.5–15.5)
WBC: 5.3 10*3/uL (ref 4.0–10.5)
nRBC: 0 % (ref 0.0–0.2)
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
## 2023-07-26 ENCOUNTER — Encounter: Payer: Self-pay | Admitting: Gastroenterology

## 2023-08-01 ENCOUNTER — Encounter: Payer: Self-pay | Admitting: Gastroenterology

## 2023-08-02 ENCOUNTER — Ambulatory Visit: Payer: Self-pay | Admitting: Anesthesiology

## 2023-08-02 ENCOUNTER — Encounter
Admission: RE | Disposition: A | Discharge status: Home / Self Care | Payer: Self-pay | Source: Home / Self Care | Attending: Gastroenterology

## 2023-08-02 ENCOUNTER — Ambulatory Visit
Admission: RE | Admit: 2023-08-02 | Discharge: 2023-08-02 | Disposition: A | Discharge status: Home / Self Care | Attending: Gastroenterology | Admitting: Gastroenterology

## 2023-08-02 ENCOUNTER — Encounter: Payer: Self-pay | Admitting: Gastroenterology

## 2023-08-02 DIAGNOSIS — M797 Fibromyalgia: Secondary | ICD-10-CM | POA: Diagnosis not present

## 2023-08-02 DIAGNOSIS — J449 Chronic obstructive pulmonary disease, unspecified: Secondary | ICD-10-CM | POA: Insufficient documentation

## 2023-08-02 DIAGNOSIS — D126 Benign neoplasm of colon, unspecified: Secondary | ICD-10-CM

## 2023-08-02 DIAGNOSIS — K5730 Diverticulosis of large intestine without perforation or abscess without bleeding: Secondary | ICD-10-CM

## 2023-08-02 DIAGNOSIS — D120 Benign neoplasm of cecum: Secondary | ICD-10-CM | POA: Diagnosis not present

## 2023-08-02 DIAGNOSIS — I10 Essential (primary) hypertension: Secondary | ICD-10-CM | POA: Diagnosis not present

## 2023-08-02 DIAGNOSIS — D122 Benign neoplasm of ascending colon: Secondary | ICD-10-CM | POA: Diagnosis not present

## 2023-08-02 DIAGNOSIS — F17200 Nicotine dependence, unspecified, uncomplicated: Secondary | ICD-10-CM | POA: Diagnosis not present

## 2023-08-02 DIAGNOSIS — K635 Polyp of colon: Secondary | ICD-10-CM

## 2023-08-02 DIAGNOSIS — F17210 Nicotine dependence, cigarettes, uncomplicated: Secondary | ICD-10-CM | POA: Diagnosis not present

## 2023-08-02 DIAGNOSIS — Z5986 Financial insecurity: Secondary | ICD-10-CM | POA: Diagnosis not present

## 2023-08-02 DIAGNOSIS — D124 Benign neoplasm of descending colon: Secondary | ICD-10-CM

## 2023-08-02 DIAGNOSIS — K219 Gastro-esophageal reflux disease without esophagitis: Secondary | ICD-10-CM | POA: Diagnosis not present

## 2023-08-02 DIAGNOSIS — F419 Anxiety disorder, unspecified: Secondary | ICD-10-CM | POA: Insufficient documentation

## 2023-08-02 DIAGNOSIS — Z1211 Encounter for screening for malignant neoplasm of colon: Secondary | ICD-10-CM | POA: Diagnosis not present

## 2023-08-02 HISTORY — PX: COLONOSCOPY: SHX5424

## 2023-08-02 HISTORY — DX: Chronic maxillary sinusitis: J32.0

## 2023-08-02 HISTORY — DX: Hypokalemia: E87.6

## 2023-08-02 HISTORY — DX: Other spondylosis with radiculopathy, cervical region: M47.22

## 2023-08-02 HISTORY — DX: Immune thrombocytopenic purpura: D69.3

## 2023-08-02 SURGERY — COLONOSCOPY
Anesthesia: General

## 2023-08-02 MED ORDER — LIDOCAINE HCL (CARDIAC) PF 100 MG/5ML IV SOSY
PREFILLED_SYRINGE | INTRAVENOUS | Status: DC | PRN
  Administered 2023-08-02: 100 mg via INTRAVENOUS

## 2023-08-02 MED ORDER — PROPOFOL 500 MG/50ML IV EMUL
INTRAVENOUS | Status: DC | PRN
Start: 2023-08-02 — End: 2023-08-02
  Administered 2023-08-02: 50 mg via INTRAVENOUS
  Administered 2023-08-02: 50 ug/kg/min via INTRAVENOUS

## 2023-08-02 MED ORDER — LIDOCAINE HCL (PF) 2 % IJ SOLN
INTRAMUSCULAR | Status: AC
  Filled 2023-08-02: qty 5

## 2023-08-02 MED ORDER — PROPOFOL 10 MG/ML IV BOLUS
INTRAVENOUS | Status: AC
  Filled 2023-08-02: qty 40

## 2023-08-02 MED ORDER — SODIUM CHLORIDE 0.9 % IV SOLN
INTRAVENOUS | Status: DC
  Administered 2023-08-02: 1000 mL via INTRAVENOUS

## 2023-08-02 NOTE — Op Note (Signed)
 [facility] Gastroenterology Patient Name: Erica Bartlett Procedure Date: 08/02/2023 11:20 AM MRN: [id number] Account #: [id number] Date of Birth: Jul 07, 1955 Admit Type: Outpatient Age: 68 Room: [facility] ENDO ROOM 2 Gender: Female Note Status: Finalized Instrument Name: Nelda Marseille [id number] Procedure:             Colonoscopy Indications:           Screening for colorectal malignant neoplasm Providers:             [name] MD, MD Referring MD:          Smitty Cords (Referring MD) Medicines:             Monitored Anesthesia Care Complications:         No immediate complications. Procedure:             Pre-Anesthesia Assessment:                        - Prior to the procedure, a History and Physical was                         performed, and patient medications, allergies and                         sensitivities were reviewed. The patient's tolerance                         of previous anesthesia was reviewed.                        - The risks and benefits of the procedure and the                         sedation options and risks were discussed with the                         patient. All questions were answered and informed                         consent was obtained.                        - ASA Grade Assessment: II - A patient with mild                         systemic disease.                        After obtaining informed consent, the colonoscope was                         passed under direct vision. Throughout the procedure,                         the patient's blood pressure, pulse, and oxygen                         saturations were monitored continuously. The                         Colonoscope was introduced  through the anus and                         advanced to the the cecum, identified by the                         appendiceal orifice. The colonoscopy was performed                         with ease. The patient tolerated the  procedure well.                         The quality of the bowel preparation was good. The                         ileocecal valve, appendiceal orifice, and rectum were                         photographed. Findings:      The perianal and digital rectal examinations were normal.      Multiple medium-mouthed diverticula were found in the left colon.      Two sessile polyps were found in the descending colon. The polyps were 5       to 6 mm in size. These polyps were removed with a cold snare. Resection       and retrieval were complete.      Three sessile polyps were found in the ascending colon. The polyps were       5 to 8 mm in size. These polyps were removed with a cold snare.       Resection and retrieval were complete.      Two sessile polyps were found in the cecum. The polyps were 5 to 6 mm in       size. These polyps were removed with a cold snare. Resection and       retrieval were complete.      A 20 mm polyp was found in the cecum. The polyp was sessile. Area was       successfully injected with 7 mL Eleview for lesion assessment, and this       injection appeared to lift the lesion adequately. The polyp was removed       with a hot snare. Resection and retrieval were complete. To prevent       bleeding after the polypectomy, two hemostatic clips were successfully       placed (MR conditional). Clip manufacturer: [employer]. There was       no bleeding at the end of the procedure.      The exam was otherwise without abnormality on direct and retroflexion       views. Impression:            - Diverticulosis in the left colon.                        - Two 5 to 6 mm polyps in the descending colon,                         removed with a cold snare. Resected and retrieved.                        -  Three 5 to 8 mm polyps in the ascending colon,                         removed with a cold snare. Resected and retrieved.                        - Two 5 to 6 mm polyps in the  cecum, removed with a                         cold snare. Resected and retrieved.                        - One 20 mm polyp in the cecum, removed with a hot                         snare. Resected and retrieved. Injected. Clips (MR                         conditional) were placed. Clip manufacturer: [employer].                        - The examination was otherwise normal on direct and                         retroflexion views. Recommendation:        - Discharge patient to home (with escort).                        - Resume previous diet.                        - Continue present medications.                        - Await pathology results.                        - Repeat colonoscopy in 6 months for surveillance                         after piecemeal polypectomy. Procedure Code(s):     --- Professional ---                        [phone], Colonoscopy, flexible; with removal of                         tumor(s), polyp(s), or other lesion(s) by snare                         technique                        [id number], Colonoscopy, flexible; with directed submucosal                         injection(s), any substance Diagnosis Code(s):     --- Professional ---  Z12.11, Encounter for screening for malignant neoplasm                         of colon                        D12.4, Benign neoplasm of descending colon                        D12.0, Benign neoplasm of cecum                        D12.2, Benign neoplasm of ascending colon                        K57.30, Diverticulosis of large intestine without                         perforation or abscess without bleeding CPT copyright 2022 American Medical Association. All rights reserved. The codes documented in this report are preliminary and upon coder review may  be revised to meet current compliance requirements. [name], MD [name] MD, MD 08/02/2023 11:50:40 AM This report has been  signed electronically. Number of Addenda: 0 Note Initiated On: 08/02/2023 11:20 AM Scope Withdrawal Time: 0 hours 16 minutes 58 seconds  Total Procedure Duration: 0 hours 20 minutes 38 seconds  Estimated Blood Loss:  Estimated blood loss: none.      [facility]

## 2023-08-02 NOTE — Transfer of Care (Signed)
 Immediate Anesthesia Transfer of Care Note  Patient: Erica Bartlett  Procedure(s) Performed: COLONOSCOPY  Patient Location: PACU  Anesthesia Type:MAC  Level of Consciousness: sedated  Airway & Oxygen Therapy: Patient Spontanous Breathing  Post-op Assessment: Report given to RN and Post -op Vital signs reviewed and stable  Post vital signs: stable  Last Vitals:  Vitals Value Taken Time  BP 101/66 08/02/23 1155  Temp    Pulse 54 08/02/23 1155  Resp 17 08/02/23 1155  SpO2 100 % 08/02/23 1155  Vitals shown include unfiled device data.  Last Pain:  Vitals:   08/02/23 1055  TempSrc: Temporal  PainSc: 0-No pain         Complications: No notable events documented.

## 2023-08-02 NOTE — H&P (Signed)
 [name], MD [address], [place], [place], [address] [address], [place], [place], [address] Phone: [phone]  Fax: [phone]  Primary Care Physician:  Smitty Cords, DO   Pre-Procedure History & Physical: HPI:  Erica Bartlett is a 68 y.o. female is here for an colonoscopy.   Past Medical History:  Diagnosis Date   Anxiety    Cancer (HCC)    Chronic maxillary sinusitis    Hypertension    Hypokalemia    Idiopathic thrombocytopenic purpura (HCC)    Osteoarthritis of spine with radiculopathy, cervical region     Past Surgical History:  Procedure Laterality Date   ABDOMINAL HYSTERECTOMY     BREAST BIOPSY Left    age 51 surgical bx neg   CESAREAN SECTION     cyst removed     breast, benign    Prior to Admission medications   Medication Sig Start Date End Date Taking? Authorizing Provider  diazepam (VALIUM) 2 MG tablet Take 2 mg by mouth every 8 (eight) hours as needed for anxiety.   Yes [provider]  nabumetone (RELAFEN) 500 MG tablet Take 500 mg by mouth 2 (two) times daily.   Yes [provider]  pregabalin (LYRICA) 50 MG capsule Take 50 mg by mouth 3 (three) times daily.   Yes [provider]  acetaminophen (TYLENOL) 500 MG tablet Take 500 mg by mouth every 6 (six) hours as needed.    [provider]  amLODipine (NORVASC) 10 MG tablet TAKE 1 TABLET BY MOUTH EVERY DAY 01/09/23   [name], [name], DO  amoxicillin-clavulanate (AUGMENTIN) 875-125 MG tablet Take 1 tablet by mouth 2 (two) times daily. Patient not taking: Reported on 07/24/2023 06/08/23   Smitty Cords, DO  azithromycin [facility]) 250 MG tablet Take 2 tabs today, then 1 tab daily x 4 days Patient not taking: Reported on 07/24/2023 06/05/23   [name], NP  benzonatate (TESSALON) 100 MG capsule Take 1 capsule (100 mg total) by mouth 2 (two) times daily as needed for cough. Patient not taking: Reported  on 07/24/2023 06/01/23   [name], NP  chlorpheniramine-HYDROcodone (TUSSIONEX) 10-8 MG/5ML Take 5 mLs by mouth every 12 (twelve) hours as needed for cough. Patient not taking: Reported on 07/24/2023 06/08/23   Smitty Cords, DO  citalopram (CELEXA) 40 MG tablet Take 40 mg by mouth daily. 06/08/23   [provider]  clonazePAM (KLONOPIN) 1 MG tablet Take 1-2 mg by mouth 3 (three) times daily as needed. Reported on 10/11/2015    [provider]  fluticasone (FLONASE) 50 MCG/ACT nasal spray Place 2 sprays into both nostrils daily. 07/21/22   Karamalegos, [name], DO  hydrochlorothiazide (HYDRODIURIL) 25 MG tablet TAKE 1 TABLET (25 MG TOTAL) BY MOUTH DAILY. 01/09/23   Karamalegos, Alexander J, DO  KLOR-CON M20 20 MEQ tablet TAKE 1 TABLET BY MOUTH EVERY DAY Patient not taking: Reported on 07/24/2023 05/15/22   [name], MD  Lidocaine-Salicylic Acid 5-40 % GEL Apply 1 Application topically daily. 01/16/23   [name], DPM  ondansetron (ZOFRAN) 4 MG tablet Take 1 tablet (4 mg total) by mouth every 8 (eight) hours as needed for nausea or vomiting. 02/15/21   Karamalegos, [name], DO  pantoprazole (PROTONIX) 40 MG tablet TAKE 1 TABLET BY MOUTH EVERY DAY 12/26/22   Karamalegos, [name], DO  predniSONE (DELTASONE) 50 MG tablet Take 1 tablet (50 mg total) by mouth  daily with breakfast. Patient not taking: Reported on 07/24/2023 06/08/23   Smitty Cords, DO  prochlorperazine (COMPAZINE) 10 MG tablet Take 1 tablet (10 mg total) by mouth every 6 (six) hours as needed for nausea or vomiting. 04/07/22   [name], MD  trimethoprim-polymyxin b (POLYTRIM) ophthalmic solution Place 1 drop into the right eye in the morning, at noon, in the evening, and at bedtime. For 1-2 weeks or until resolved. 11/22/22   Karamalegos, [name], DO  VENTOLIN HFA 108 (90 Base) MCG/ACT inhaler Inhale 1-2 puffs into the lungs every 6 (six) hours as needed for wheezing or  shortness of breath. 03/06/23   Smitty Cords, DO    Allergies as of 07/02/2023 - Review Complete 06/29/2023  Allergen Reaction Noted   Doxycycline Nausea And Vomiting 01/24/2017   Penicillins Hives 10/15/2014    Family History  Problem Relation Age of Onset   Diabetes Mother    Heart failure Father    Kidney failure Father     Social History   Socioeconomic History   Marital status: Widowed    Spouse name: Not on file   Number of children: Not on file   Years of education: Not on file   Highest education level: Not on file  Occupational History    Comment: part time  Tobacco Use   Smoking status: Some Days    Current packs/day: 0.15    Types: Cigarettes   Smokeless tobacco: Never   Tobacco comments:    1 PACK A WEEK  Vaping Use   Vaping status: Never Used  Substance and Sexual Activity   Alcohol use: No   Drug use: No   Sexual activity: Yes    Birth control/protection: Post-menopausal  Other Topics Concern   Not on file  Social History Narrative   Not on file   Social Drivers of Health   Financial Resource Strain: Medium Risk (06/29/2023)   Overall Financial Resource Strain (CARDIA)    Difficulty of Paying Living Expenses: Somewhat hard  Food Insecurity: Food Insecurity Present (06/29/2023)   Hunger Vital Sign    Worried About Running Out of Food in the Last Year: Sometimes true    Ran Out of Food in the Last Year: Sometimes true  Transportation Needs: No Transportation Needs (06/29/2023)   PRAPARE - Administrator, Civil Service (Medical): No    Lack of Transportation (Non-Medical): No  Physical Activity: Sufficiently Active (06/29/2023)   Exercise Vital Sign    Days of Exercise per Week: 5 days    Minutes of Exercise per Session: 60 min  Stress: Stress Concern Present (06/29/2023)   [employer] of Occupational Health - Occupational Stress Questionnaire    Feeling of Stress : To some extent  Social Connections: Moderately Isolated  (06/29/2023)   Social Connection and Isolation Panel [NHANES]    Frequency of Communication with Friends and Family: More than three times a week    Frequency of Social Gatherings with Friends and Family: Never    Attends Religious Services: Never    Database administrator or Organizations: Yes    Attends Engineer, structural: More than 4 times per year    Marital Status: Widowed  Intimate Partner Violence: Not At Risk (06/29/2023)   Humiliation, Afraid, Rape, and Kick questionnaire    Fear of Current or Ex-Partner: No    Emotionally Abused: No    Physically Abused: No    Sexually Abused: No  Review of Systems: See HPI, otherwise negative ROS  Physical Exam: There were no vitals taken for this visit. General:   Alert,  pleasant and cooperative in NAD Head:  Normocephalic and atraumatic. Neck:  Supple; no masses or thyromegaly. Lungs:  Clear throughout to auscultation, normal respiratory effort.    Heart:  +S1, +S2, Regular rate and rhythm, No edema. Abdomen:  Soft, nontender and nondistended. Normal bowel sounds, without guarding, and without rebound.   Neurologic:  Alert and  oriented x4;  grossly normal neurologically.  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  Questions have been answered.  All parties agreeable.   [name], MD  08/02/2023, 10:31 AM

## 2023-08-02 NOTE — Anesthesia Postprocedure Evaluation (Signed)
 Anesthesia Post Note  Patient: Erica Bartlett  Procedure(s) Performed: COLONOSCOPY  Patient location during evaluation: PACU Anesthesia Type: General Level of consciousness: awake and awake and alert Pain management: satisfactory to patient Respiratory status: spontaneous breathing Cardiovascular status: stable Anesthetic complications: no   No notable events documented.   Last Vitals:  Vitals:   08/02/23 1155 08/02/23 1205  BP: 101/66 121/64  Pulse: (!) 55 (!) 53  Resp: 14 16  Temp: (!) 35.8 C   SpO2: 100% 100%    Last Pain:  Vitals:   08/02/23 1205  TempSrc:   PainSc: 0-No pain                 VAN STAVEREN,Charman Blasco

## 2023-08-02 NOTE — Anesthesia Preprocedure Evaluation (Signed)
 Anesthesia Evaluation  Patient identified by MRN, date of birth, ID band Patient awake    Reviewed: Allergy & Precautions, NPO status , Patient's Chart, lab work & pertinent test results  Airway Mallampati: II  TM Distance: >3 FB Neck ROM: Full    Dental no notable dental hx. (+) Upper Dentures   Pulmonary neg pulmonary ROS, COPD, Current SmokerPatient did not abstain from smoking.   Pulmonary exam normal breath sounds clear to auscultation       Cardiovascular Exercise Tolerance: Good hypertension, Pt. on medications negative cardio ROS Normal cardiovascular exam Rhythm:Regular Rate:Normal     Neuro/Psych   Anxiety      Neuromuscular disease negative neurological ROS  negative psych ROS   GI/Hepatic negative GI ROS, Neg liver ROS,GERD  Medicated,,  Endo/Other  negative endocrine ROS    Renal/GU negative Renal ROS  negative genitourinary   Musculoskeletal  (+)  Fibromyalgia -  Abdominal   Peds negative pediatric ROS (+)  Hematology negative hematology ROS (+)   Anesthesia Other Findings Past Medical History: No date: Anxiety No date: Cancer (HCC) No date: Chronic maxillary sinusitis No date: Hypertension No date: Hypokalemia No date: Idiopathic thrombocytopenic purpura (HCC) No date: Osteoarthritis of spine with radiculopathy, cervical region  Past Surgical History: No date: ABDOMINAL HYSTERECTOMY No date: BREAST BIOPSY; Left     Comment:  age 18 surgical bx neg No date: CESAREAN SECTION No date: cyst removed     Comment:  breast, benign  BMI    Body Mass Index: 27.90 kg/m      Reproductive/Obstetrics negative OB ROS                             Anesthesia Physical Anesthesia Plan  ASA: 3  Anesthesia Plan: General   Post-op Pain Management:    Induction: Intravenous  PONV Risk Score and Plan: Propofol infusion and TIVA  Airway Management Planned: Natural Airway  and Nasal Cannula  Additional Equipment:   Intra-op Plan:   Post-operative Plan:   Informed Consent: I have reviewed the patients History and Physical, chart, labs and discussed the procedure including the risks, benefits and alternatives for the proposed anesthesia with the patient or authorized representative who has indicated his/her understanding and acceptance.     Dental Advisory Given  Plan Discussed with: CRNA  Anesthesia Plan Comments:        Anesthesia Quick Evaluation

## 2023-08-03 ENCOUNTER — Encounter: Payer: Self-pay | Admitting: Gastroenterology

## 2023-08-03 LAB — SURGICAL PATHOLOGY

## 2023-08-07 ENCOUNTER — Encounter: Payer: Self-pay | Admitting: Gastroenterology

## 2023-08-15 ENCOUNTER — Ambulatory Visit: Payer: Self-pay

## 2023-08-15 NOTE — Telephone Encounter (Signed)
 Chief Complaint: Back pain Symptoms: Back pain radiating down both legs Frequency: since Friday Pertinent Negatives: Patient denies pain with urination, blood in urine, numbness/tingling of arms or legs Disposition: [] ED /[] Urgent Care (no appt availability in office) / [x] Appointment(In office/virtual)/ []  [facility]/ [] Home Care/ [] Refused Recommended Disposition /[]  Mobile Bus/ []  Follow-up with PCP Additional Notes: Patient called in stating she has been having intermittent pain in her lower back that is radiating down both legs. Patient states it is worsened when trying to go up and down the stairs and is affecting her ability to do so. Patient has been treating with OTC extra strength tylenol  and heat, but showing no improvement. Appt made for further evaluation.   Copied from CRM [id number]. Topic: Clinical - Red Word Triage >> Aug 15, 2023 11:17 AM Rosamond Comes wrote: Red Word that prompted transfer to Nurse Triage: patient calling in. Lower back severe pain, hurts to walk and climb steps, pain radiates down both legs. Reason for Disposition  [1] MODERATE back pain (e.g., interferes with normal activities) AND [2] present > 3 days  Answer Assessment - Initial Assessment Questions 1. ONSET: "When did the pain begin?"      Friday of last week 2. LOCATION: "Where does it hurt?" (upper, mid or lower back)     Across lower back 3. SEVERITY: "How bad is the pain?"  (e.g., Scale 1-10; mild, moderate, or severe)   - MILD (1-3): Doesn't interfere with normal activities.    - MODERATE (4-7): Interferes with normal activities or awakens from sleep.    - SEVERE (8-10): Excruciating pain, unable to do any normal activities.      7 4. PATTERN: "Is the pain constant?" (e.g., yes, no; constant, intermittent)      Comes and goes 5. RADIATION: "Does the pain shoot into your legs or somewhere else?"     Radiate down both legs 6. CAUSE:  "What do you think is causing the back pain?"         May have been going up and down steps at job 7. BACK OVERUSE:  "Any recent lifting of heavy objects, strenuous work or exercise?"     No 8. MEDICINES: "What have you taken so far for the pain?" (e.g., nothing, acetaminophen , NSAIDS)     Tylenol  extra strength 9. NEUROLOGIC SYMPTOMS: "Do you have any weakness, numbness, or problems with bowel/bladder control?"     No 10. OTHER SYMPTOMS: "Do you have any other symptoms?" (e.g., fever, abdomen pain, burning with urination, blood in urine)       No  Protocols used: Back Pain-A-AH

## 2023-08-16 ENCOUNTER — Ambulatory Visit (INDEPENDENT_AMBULATORY_CARE_PROVIDER_SITE_OTHER): Admitting: Family Medicine

## 2023-08-16 ENCOUNTER — Encounter: Payer: Self-pay | Admitting: Family Medicine

## 2023-08-16 VITALS — BP 120/64 | HR 65 | Ht 63.5 in | Wt 164.0 lb

## 2023-08-16 DIAGNOSIS — J0110 Acute frontal sinusitis, unspecified: Secondary | ICD-10-CM | POA: Diagnosis not present

## 2023-08-16 DIAGNOSIS — M5442 Lumbago with sciatica, left side: Secondary | ICD-10-CM | POA: Diagnosis not present

## 2023-08-16 DIAGNOSIS — M5441 Lumbago with sciatica, right side: Secondary | ICD-10-CM | POA: Diagnosis not present

## 2023-08-16 MED ORDER — CYCLOBENZAPRINE HCL 10 MG PO TABS: 5.0000 mg | ORAL_TABLET | Freq: Two times a day (BID) | ORAL | 1 refills | Status: DC | PRN

## 2023-08-16 MED ORDER — AMOXICILLIN-POT CLAVULANATE 875-125 MG PO TABS: 1.0000 | ORAL_TABLET | Freq: Two times a day (BID) | ORAL | 0 refills | Status: DC

## 2023-08-16 MED ORDER — PREDNISONE 20 MG PO TABS: ORAL_TABLET | ORAL | 0 refills | Status: DC
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
## 2023-08-16 NOTE — Patient Instructions (Addendum)
 Thank you for coming to the office today.  1. For your Back Pain - I think that this is due to Muscle Spasms or strain. Your Sciatic Nerve can be affected causing some of your radiation and numbness down your legs.  Prednisone  taper 7 days Avoid anti inflammatory  May use Tylenol  Extra Str 500mg  tabs - may take 1-2 tablets every 6 hours as needed Recommend to start using heating pad on your lower back 1-2x daily for few weeks  Start Cyclobenzapine (Flexeril ) 10mg  tablets (muscle relaxant) - start with half (cut) to one whole pill at night for muscle relaxant - may make you sedated or sleepy (be careful driving or working on this) if tolerated you can take half to whole tab 2 to 3 times daily or every 8 hours as needed   Also try a Wedge Seat Cushion to avoid nerve pinching when sitting prolonged period of time.  This pain may take weeks to months to fully resolve, but hopefully it will respond to the medicine initially. All back injuries (small or serious) are slow to heal since we use our back muscles every day. Be careful with turning, twisting, lifting, sitting / standing for prolonged periods, and avoid re-injury.  If your symptoms significantly worsen with more pain, or new symptoms with weakness in one or both legs, new or different shooting leg pains, numbness in legs or groin, loss of control or retention of urine or bowel movements, please call back for advice and you may need to go directly to the Emergency Department.   Sinusitis Augmentin , Prednisone   Please schedule a Follow-up Appointment to: No follow-ups on file.  If you have any other questions or concerns, please feel free to call the office or send a message through MyChart. You may also schedule an earlier appointment if necessary.  Additionally, you may be receiving a survey about your experience at our office within a few days to 1 week by e-mail or mail. We value your feedback.  Domingo Friend, DO [facility], [place]

## 2023-08-26 ENCOUNTER — Other Ambulatory Visit: Payer: Self-pay | Admitting: Family Medicine

## 2023-08-26 DIAGNOSIS — I10 Essential (primary) hypertension: Secondary | ICD-10-CM

## 2023-08-28 NOTE — Telephone Encounter (Signed)
 Requested medications are due for refill today.  yes  Requested medications are on the active medications list.  yes  Last refill. 01/09/2023 #90 1 rf  Future visit scheduled.   no  Notes to clinic.  Labs are expired.    Requested Prescriptions  Pending Prescriptions Disp Refills   hydrochlorothiazide  (HYDRODIURIL ) 25 MG tablet [Pharmacy Med Name: HYDROCHLOROTHIAZIDE  25 MG TAB] 90 tablet 1    Sig: TAKE 1 TABLET (25 MG TOTAL) BY MOUTH DAILY.     Cardiovascular: Diuretics - Thiazide Failed - 08/28/2023  2:02 PM      Failed - Cr in normal range and within 180 days    Creat  Date Value Ref Range Status  08/17/2016 0.88 0.50 - 1.05 mg/dL Final    Comment:      For patients > or = 66 years of age: The upper reference limit for Creatinine is approximately 13% higher for people identified as African-American.      Creatinine, Ser  Date Value Ref Range Status  04/21/2022 0.97 0.44 - 1.00 mg/dL Final         Failed - K in normal range and within 180 days    Potassium  Date Value Ref Range Status  04/21/2022 3.0 (L) 3.5 - 5.1 mmol/L Final         Failed - Na in normal range and within 180 days    Sodium  Date Value Ref Range Status  04/21/2022 140 135 - 145 mmol/L Final  08/04/2015 140 134 - 144 mmol/L Final         Passed - Last BP in normal range    BP Readings from Last 1 Encounters:  08/16/23 120/64         Passed - Valid encounter within last 6 months    Recent Outpatient Visits           1 week ago Acute bilateral low back pain with bilateral sciatica   [place] [facility] Raina Bunting, DO   2 months ago Acute non-recurrent frontal sinusitis   [place] [facility] Raina Bunting, DO   2 months ago Viral URI with cough   [place] [facility] [place], [place], [place]

## 2023-09-27 ENCOUNTER — Other Ambulatory Visit: Payer: Self-pay | Admitting: Family Medicine

## 2023-09-27 DIAGNOSIS — I10 Essential (primary) hypertension: Secondary | ICD-10-CM

## 2023-09-28 NOTE — Telephone Encounter (Signed)
 Requested Prescriptions  Pending Prescriptions Disp Refills   amLODipine  (NORVASC ) 10 MG tablet [Pharmacy Med Name: AMLODIPINE  BESYLATE 10 MG TAB] 90 tablet 1    Sig: TAKE 1 TABLET BY MOUTH EVERY DAY     Cardiovascular: Calcium Channel Blockers 2 Passed - 09/28/2023 10:11 AM      Passed - Last BP in normal range    BP Readings from Last 1 Encounters:  08/16/23 120/64         Passed - Last Heart Rate in normal range    Pulse Readings from Last 1 Encounters:  08/16/23 65         Passed - Valid encounter within last 6 months    Recent Outpatient Visits           1 month ago Acute bilateral low back pain with bilateral sciatica   [place] [facility] [place], Kayleen Party, DO   3 months ago Acute non-recurrent frontal sinusitis   [place] [facility] [place], Kayleen Party, DO   3 months ago Viral URI with cough   [place] [facility] [place], [place], [place]

## 2023-10-02 ENCOUNTER — Ambulatory Visit: Payer: Self-pay

## 2023-10-02 NOTE — Telephone Encounter (Signed)
 This RN made first attempt to triage patient. No answer, LVM. Routing for additional attempts.   Copied From CRM [id number]. Reason for Triage: Possible sinus infection, no appt with PCP office until next week. Pt wants to be seen sooner.   Best contact: [phone]

## 2023-10-02 NOTE — Telephone Encounter (Signed)
 FYI Only or Action Required?: FYI only for provider  Patient was last seen in primary care on 08/16/2023 by Raina Bunting, DO. Called Nurse Triage reporting Sinusitis. Symptoms began today. Interventions attempted: Nothing. Symptoms are: stable.  Triage Disposition: No disposition on file.  Patient/caregiver understands and will follow disposition?:  Reason for Disposition  [1] Sinus congestion (pressure, fullness) AND [2] present > 10 days  Answer Assessment - Initial Assessment Questions 1. LOCATION: "Where does it hurt?"      Temples, under eyes 2. ONSET: "When did the sinus pain start?"  (e.g., hours, days)      This am 3. SEVERITY: "How bad is the pain?"   (Scale 1-10; mild, moderate or severe)   - MILD (1-3): doesn't interfere with normal activities    - MODERATE (4-7): interferes with normal activities (e.g., work or school) or awakens from sleep   - SEVERE (8-10): excruciating pain and patient unable to do any normal activities        severe 4. RECURRENT SYMPTOM: "Have you ever had sinus problems before?" If Yes, ask: "When was the last time?" and "What happened that time?"      yes 5. NASAL CONGESTION: "Is the nose blocked?" If Yes, ask: "Can you open it or must you breathe through your mouth?"     sneezing 6. NASAL DISCHARGE: "Do you have discharge from your nose?" If so ask, "What color?"     White yellow 7. FEVER: "Do you have a fever?" If Yes, ask: "What is it, how was it measured, and when did it start?"      Felt chilled and then hot 8. OTHER SYMPTOMS: "Do you have any other symptoms?" (e.g., sore throat, cough, earache, difficulty breathing)     no 9. PREGNANCY: "Is there any chance you are pregnant?" "When was your last menstrual period?"     na  Protocols used: Sinus Pain or Congestion-A-AH

## 2023-10-03 ENCOUNTER — Encounter: Payer: Self-pay | Admitting: Internal Medicine

## 2023-10-03 ENCOUNTER — Ambulatory Visit (INDEPENDENT_AMBULATORY_CARE_PROVIDER_SITE_OTHER): Admitting: Internal Medicine

## 2023-10-03 VITALS — BP 124/68 | HR 76 | Ht 63.5 in | Wt 164.6 lb

## 2023-10-03 DIAGNOSIS — J0140 Acute pansinusitis, unspecified: Secondary | ICD-10-CM | POA: Diagnosis not present

## 2023-10-03 MED ORDER — PREDNISONE 10 MG PO TABS
ORAL_TABLET | ORAL | 0 refills | Status: DC
Start: 2023-10-03 — End: 2023-12-12

## 2023-10-03 MED ORDER — AMOXICILLIN-POT CLAVULANATE 875-125 MG PO TABS: 1.0000 | ORAL_TABLET | Freq: Two times a day (BID) | ORAL | 0 refills | Status: DC

## 2023-10-03 NOTE — Patient Instructions (Signed)

## 2023-10-03 NOTE — Progress Notes (Signed)
 Subjective:    Patient ID: Erica Bartlett, female    DOB: 1955-08-13, 68 y.o.   MRN: [id number]  HPI  Discussed the use of AI scribe software for clinical note transcription with the patient, who gave verbal consent to proceed.  Erica Bartlett is a 68 year old female who presents with worsening sinus congestion and pressure.  She has been experiencing worsening sinus symptoms since Sunday, including significant congestion and hoarseness. Sinus pressure is present without headache, accompanied by a runny nose and nasal congestion. Nasal discharge is light yellow when she blows her nose.  She has a sore throat that began on Sunday morning, which has improved slightly after gargling and drinking lemon tea, but remains somewhat sore. She also has a cough with a small amount of phlegm production.  Last night, she experienced chills, with episodes of feeling alternately hot and cold. She recalls a similar episode in February, for which she was treated with Augmentin  and Prednisone , which resolved her symptoms at that time. Her current symptoms began after getting wet at an event on Saturday.      Review of Systems   Past Medical History:  Diagnosis Date   Anxiety    Cancer (HCC)    Chronic maxillary sinusitis    Hypertension    Hypokalemia    Idiopathic thrombocytopenic purpura (HCC)    Osteoarthritis of spine with radiculopathy, cervical region     Current Outpatient Medications  Medication Sig Dispense Refill   acetaminophen  (TYLENOL ) 500 MG tablet Take 500 mg by mouth every 6 (six) hours as needed.     amLODipine  (NORVASC ) 10 MG tablet TAKE 1 TABLET BY MOUTH EVERY DAY 90 tablet 1   amoxicillin -clavulanate (AUGMENTIN ) 875-125 MG tablet Take 1 tablet by mouth 2 (two) times daily. 20 tablet 0   citalopram (CELEXA) 40 MG tablet Take 40 mg by mouth daily.     clonazePAM  (KLONOPIN ) 1 MG tablet Take 1-2 mg by mouth 3 (three) times daily as needed. Reported on 10/11/2015      cyclobenzaprine  (FLEXERIL ) 10 MG tablet Take 0.5-1 tablets (5-10 mg total) by mouth 2 (two) times daily as needed for muscle spasms. 60 tablet 1   fluticasone  (FLONASE ) 50 MCG/ACT nasal spray Place 2 sprays into both nostrils daily. 16 g 11   hydrochlorothiazide  (HYDRODIURIL ) 25 MG tablet TAKE 1 TABLET (25 MG TOTAL) BY MOUTH DAILY. 90 tablet 1   KLOR-CON  M20 20 MEQ tablet TAKE 1 TABLET BY MOUTH EVERY DAY (Patient not taking: Reported on 06/29/2023) 90 tablet 1   Lidocaine -Salicylic Acid  5-40 % GEL Apply 1 Application topically daily. 60 g 2   ondansetron  (ZOFRAN ) 4 MG tablet Take 1 tablet (4 mg total) by mouth every 8 (eight) hours as needed for nausea or vomiting. (Patient not taking: Reported on 08/16/2023) 60 tablet 2   pantoprazole  (PROTONIX ) 40 MG tablet TAKE 1 TABLET BY MOUTH EVERY DAY 90 tablet 3   predniSONE  (DELTASONE ) 20 MG tablet Take daily with food. Start with 60mg  (3 pills) x 2 days, then reduce to 40mg  (2 pills) x 2 days, then 20mg  (1 pill) x 3 days 13 tablet 0   prochlorperazine  (COMPAZINE ) 10 MG tablet Take 1 tablet (10 mg total) by mouth every 6 (six) hours as needed for nausea or vomiting. (Patient not taking: Reported on 08/16/2023) 30 tablet 0   VENTOLIN  HFA 108 (90 Base) MCG/ACT inhaler Inhale 1-2 puffs into the lungs every 6 (six) hours as needed for wheezing or shortness  of breath. (Patient not taking: Reported on 08/16/2023) 18 g 2   No current facility-administered medications for this visit.    Allergies  Allergen Reactions   Doxycycline  Nausea And Vomiting   Penicillins Hives    Family History  Problem Relation Age of Onset   Diabetes Mother    Heart failure Father    Kidney failure Father     Social History   Socioeconomic History   Marital status: Widowed    Spouse name: Not on file   Number of children: Not on file   Years of education: Not on file   Highest education level: Not on file  Occupational History    Comment: part time  Tobacco Use   Smoking  status: Some Days    Current packs/day: 0.15    Types: Cigarettes   Smokeless tobacco: Never   Tobacco comments:    1 PACK A WEEK  Vaping Use   Vaping status: Never Used  Substance and Sexual Activity   Alcohol use: No   Drug use: No   Sexual activity: Yes    Birth control/protection: Post-menopausal  Other Topics Concern   Not on file  Social History Narrative   Not on file   Social Drivers of Health   Financial Resource Strain: Medium Risk (06/29/2023)   Overall Financial Resource Strain (CARDIA)    Difficulty of Paying Living Expenses: Somewhat hard  Food Insecurity: Food Insecurity Present (06/29/2023)   Hunger Vital Sign    Worried About Running Out of Food in the Last Year: Sometimes true    Ran Out of Food in the Last Year: Sometimes true  Transportation Needs: No Transportation Needs (06/29/2023)   PRAPARE - Administrator, Civil Service (Medical): No    Lack of Transportation (Non-Medical): No  Physical Activity: Sufficiently Active (06/29/2023)   Exercise Vital Sign    Days of Exercise per Week: 5 days    Minutes of Exercise per Session: 60 min  Stress: Stress Concern Present (06/29/2023)   [employer] of Occupational Health - Occupational Stress Questionnaire    Feeling of Stress : To some extent  Social Connections: Moderately Isolated (06/29/2023)   Social Connection and Isolation Panel [NHANES]    Frequency of Communication with Friends and Family: More than three times a week    Frequency of Social Gatherings with Friends and Family: Never    Attends Religious Services: Never    Database administrator or Organizations: Yes    Attends Engineer, structural: More than 4 times per year    Marital Status: Widowed  Intimate Partner Violence: Not At Risk (06/29/2023)   Humiliation, Afraid, Rape, and Kick questionnaire    Fear of Current or Ex-Partner: No    Emotionally Abused: No    Physically Abused: No    Sexually Abused: No      Constitutional: Pt reports chills. Denies fever, malaise, fatigue, headache or abrupt weight changes.  HEENT: Patient reports sinus pressure, runny nose, nasal congestion and sore throat.  Denies eye pain, eye redness, ear pain, ringing in the ears, wax buildup, bloody nose. Respiratory: Patient reports cough.  Denies difficulty breathing, shortness of breath, or sputum production.   Cardiovascular: Denies chest pain, chest tightness, palpitations or swelling in the hands or feet.  Gastrointestinal: Denies abdominal pain, bloating, constipation, diarrhea or blood in the stool.  Neurological: Denies dizziness, difficulty with memory, difficulty with speech or problems with balance and coordination.    No other  specific complaints in a complete review of systems (except as listed in HPI above).      Objective:   Physical Exam  BP 124/68 (BP Location: Right Arm, Patient Position: Sitting, Cuff Size: Normal)   Pulse 76   Ht 5' 3.5 (1.613 m)   Wt 164 lb 9.6 oz (74.7 kg)   SpO2 98%   BMI 28.70 kg/m   Wt Readings from Last 3 Encounters:  08/16/23 164 lb (74.4 kg)  08/02/23 160 lb (72.6 kg)  07/24/23 164 lb 4.8 oz (74.5 kg)    General: Appears her stated age, overweight, in NAD. HEENT: Head: normal shape and size, maxillary and frontal sinus tenderness noted; Eyes: sclera white, no icterus, conjunctiva pink, PERRLA and EOMs intact; Ears: Tm's gray and intact, normal light reflex; Nose: mucosa boggy and moist, turbinates swollen; Throat/Mouth: Teeth present, mucosa pink and moist, + PND, no exudate, lesions or ulcerations noted.  Neck: No adenopathy noted. Cardiovascular: Normal rate and rhythm.  Pulmonary/Chest: Normal effort and positive vesicular breath sounds. No respiratory distress. No wheezes, rales or ronchi noted.  Neurological: Alert and oriented.   BMET    Component Value Date/Time   NA 140 04/21/2022 0811   NA 140 08/04/2015 0938   K 3.0 (L) 04/21/2022 0811   CL  104 04/21/2022 0811   CO2 28 04/21/2022 0811   GLUCOSE 113 (H) 04/21/2022 0811   BUN 15 04/21/2022 0811   BUN 17 08/04/2015 0938   CREATININE 0.97 04/21/2022 0811   CREATININE 0.88 08/17/2016 0939   CALCIUM 9.3 04/21/2022 0811   GFRNONAA >60 04/21/2022 0811   GFRAA 74 08/04/2015 0938    Lipid Panel  No results found for: CHOL, TRIG, HDL, CHOLHDL, VLDL, LDLCALC  CBC    Component Value Date/Time   WBC 5.3 07/24/2023 1035   RBC 4.83 07/24/2023 1035   HGB 13.7 07/24/2023 1035   HGB 14.5 07/26/2015 1358   HCT 41.6 07/24/2023 1035   HCT 42.6 07/26/2015 1358   PLT 252 07/24/2023 1035   PLT 3 (<) 07/26/2015 1358   MCV 86.1 07/24/2023 1035   MCV 85 07/26/2015 1358   MCH 28.4 07/24/2023 1035   MCHC 32.9 07/24/2023 1035   RDW 14.9 07/24/2023 1035   RDW 15.3 07/26/2015 1358   LYMPHSABS 1.4 07/24/2023 1035   LYMPHSABS 1.7 07/26/2015 1358   MONOABS 0.3 07/24/2023 1035   EOSABS 0.2 07/24/2023 1035   EOSABS 0.4 07/26/2015 1358   BASOSABS 0.0 07/24/2023 1035   BASOSABS 0.1 07/26/2015 1358    Hgb A1C No results found for: HGBA1C          Assessment & Plan:  Assessment and Plan    Acute Sinusitis Symptoms and history suggest acute sinusitis. Previous episode responded to Augmentin  and prednisone . - Prescribed Augmentin  875 mg/125 mg BID for 10 days. - Prescribed prednisone  taper for inflammation and pressure relief. - Advised to report if no improvement after medication.        Follow-up with your PCP as previously scheduled Helayne Lo, NP

## 2023-10-09 ENCOUNTER — Ambulatory Visit

## 2023-10-09 ENCOUNTER — Ambulatory Visit: Admitting: Family Medicine

## 2023-10-19 ENCOUNTER — Other Ambulatory Visit: Payer: Self-pay | Admitting: Family Medicine

## 2023-10-19 DIAGNOSIS — M5441 Lumbago with sciatica, right side: Secondary | ICD-10-CM

## 2023-10-19 NOTE — Telephone Encounter (Signed)
 Requested medications are due for refill today.  yes  Requested medications are on the active medications list.  yes  Last refill. 08/16/2023 #60 1 rf  Future visit scheduled.   no  Notes to clinic.  Refill not delegated.    Requested Prescriptions  Pending Prescriptions Disp Refills   cyclobenzaprine  (FLEXERIL ) 10 MG tablet [Pharmacy Med Name: CYCLOBENZAPRINE  10 MG TABLET] 60 tablet 1    Sig: Take 0.5-1 tablets (5-10 mg total) by mouth 2 (two) times daily as needed for muscle spasms.     Not Delegated - Analgesics:  Muscle Relaxants Failed - 10/19/2023  3:49 PM      Failed - This refill cannot be delegated      Passed - Valid encounter within last 6 months    Recent Outpatient Visits           2 weeks ago Acute non-recurrent pansinusitis   [place] [facility] [place], [place] W, NP   2 months ago Acute bilateral low back pain with bilateral sciatica   [place] [facility] [place]a PARAS, DO   4 months ago Acute non-recurrent frontal sinusitis   [place] [facility] [place] PARAS, DO   4 months ago Viral URI with cough   [place] [facility] [place], Angeline ORN, [place]

## 2023-10-23 ENCOUNTER — Inpatient Hospital Stay: Attending: Oncology

## 2023-11-06 ENCOUNTER — Encounter: Payer: Self-pay | Admitting: Podiatry

## 2023-11-06 ENCOUNTER — Ambulatory Visit: Admitting: Podiatry

## 2023-11-06 VITALS — Ht 63.5 in | Wt 164.6 lb

## 2023-11-06 DIAGNOSIS — D2371 Other benign neoplasm of skin of right lower limb, including hip: Secondary | ICD-10-CM | POA: Diagnosis not present

## 2023-11-09 NOTE — Progress Notes (Signed)
   Chief Complaint  Patient presents with   Toe Pain    Pt is here due to right pinky toe there is some type of blister on the side of the toe, she states its very painful and hard to wear shoes because of it, she also has a callous on the left foot on the bottom behind her toe that she wants to have removed.    Subjective: 68 y.o. female presenting to the office today for follow-up evaluation of symptomatic skin lesions to the plantar aspect of the bilateral feet.  Very tender and painful with palpation.  With each debridement they feel better but eventually the lesions return.   Past Medical History:  Diagnosis Date   Anxiety    Cancer (HCC)    Chronic maxillary sinusitis    Hypertension    Hypokalemia    Idiopathic thrombocytopenic purpura (HCC)    Osteoarthritis of spine with radiculopathy, cervical region     Past Surgical History:  Procedure Laterality Date   ABDOMINAL HYSTERECTOMY     BREAST BIOPSY Left    age 41 surgical bx neg   CESAREAN SECTION     COLONOSCOPY N/A 08/02/2023   Procedure: COLONOSCOPY;  Surgeon: Therisa Bi, MD;  Location: [facility] ENDOSCOPY;  Service: Gastroenterology;  Laterality: N/A;   cyst removed     breast, benign    Allergies  Allergen Reactions   Doxycycline  Nausea And Vomiting   Penicillins Hives     Objective:  Physical Exam General: Alert and oriented x3 in no acute distress  Dermatology: Hyperkeratotic lesion(s) present on the bilateral feet. Pain on palpation with a central nucleated core noted. Skin is warm, dry and supple bilateral lower extremities. Negative for open lesions or macerations.   Vascular: Palpable pedal pulses bilaterally. No edema or erythema noted. Capillary refill within normal limits.  Neurological: Grossly intact via light touch  Musculoskeletal Exam: Pain on palpation at the keratotic lesion(s) noted. Range of motion within normal limits bilateral. Muscle strength 5/5 in all groups bilateral.  Assessment: 1.   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  -Salicylic acid  applied with a bandaid - Continue to recommend OTC salicylic acid  daily -Referral placed for dermatology for second opinion -Return to the clinic PRN.   Thresa EMERSON Sar, DPM [facility]  Dr. Thresa EMERSON Sar, DPM    [address] [place], [place] [address]                Office [phone]  Fax [phone]

## 2023-12-04 ENCOUNTER — Ambulatory Visit: Payer: Self-pay

## 2023-12-04 ENCOUNTER — Ambulatory Visit (INDEPENDENT_AMBULATORY_CARE_PROVIDER_SITE_OTHER): Admitting: Internal Medicine

## 2023-12-04 ENCOUNTER — Encounter: Payer: Self-pay | Admitting: Internal Medicine

## 2023-12-04 VITALS — BP 128/74 | Ht 63.5 in | Wt 166.0 lb

## 2023-12-04 DIAGNOSIS — M5441 Lumbago with sciatica, right side: Secondary | ICD-10-CM | POA: Diagnosis not present

## 2023-12-04 DIAGNOSIS — G8929 Other chronic pain: Secondary | ICD-10-CM | POA: Diagnosis not present

## 2023-12-04 MED ORDER — NAPROXEN 500 MG PO TABS: 500.0000 mg | ORAL_TABLET | Freq: Two times a day (BID) | ORAL | 0 refills | Status: DC

## 2023-12-04 MED ORDER — TIZANIDINE HCL 2 MG PO CAPS: 2.0000 mg | ORAL_CAPSULE | Freq: Every evening | ORAL | 0 refills | Status: DC | PRN

## 2023-12-04 MED ORDER — KETOROLAC TROMETHAMINE 30 MG/ML IJ SOLN
30.0000 mg | Freq: Once | INTRAMUSCULAR | Status: AC
  Administered 2023-12-04 (×2): 30 mg via INTRAMUSCULAR
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
## 2023-12-04 NOTE — Telephone Encounter (Signed)
 FYI Only or Action Required?: FYI only for provider.  Patient was last seen in primary care on 10/03/2023 by Antonette Angeline ORN, NP.  Called Nurse Triage reporting Back Pain.  Symptoms began several days ago.  Interventions attempted: OTC medications: Tylenol  and Aleve .  Symptoms are: gradually worsening.  Triage Disposition: See HCP Within 4 Hours (Or PCP Triage)  Patient/caregiver understands and will follow disposition?: Yes  Copied from CRM #[id number]. Topic: Clinical - Red Word Triage >> Dec 04, 2023  9:49 AM Treva T wrote: [facility] that prompted transfer to Nurse Triage: Received call from patient, reports she has severe back pain, with difficulty walking. Reason for Disposition  [1] SEVERE back pain (e.g., excruciating, unable to do any normal activities) AND [2] not improved 2 hours after pain medicine  Answer Assessment - Initial Assessment Questions 1. ONSET: When did the pain begin? (e.g., minutes, hours, days)     Pain started on Saturday (4 days ago)  2. LOCATION: Where does it hurt? (upper, mid or lower back)     Low Back Pain  3. SEVERITY: How bad is the pain?  (e.g., Scale 1-10; mild, moderate, or severe)     Severe  4. PATTERN: Is the pain constant? (e.g., yes, no; constant, intermittent)      Constant pain  5. RADIATION: Does the pain shoot into your legs or somewhere else?     Shoots down right leg when walking or climbing steps  6. CAUSE:  What do you think is causing the back pain?      Have been doing more walking around the hospital visiting family  7. BACK OVERUSE:  Any recent lifting of heavy objects, strenuous work or exercise?     Has been doing more strenuous activity, helping mother transfer to and from walker  8. MEDICINES: What have you taken so far for the pain? (e.g., nothing, acetaminophen , NSAIDS)     Tylenol  extra strength and Aleve   9. NEUROLOGIC SYMPTOMS: Do you have any weakness, numbness, or problems with bowel/bladder  control?     No  10. OTHER SYMPTOMS: Do you have any other symptoms? (e.g., fever, abdomen pain, burning with urination, blood in urine)       No  11. PREGNANCY: Is there any chance you are pregnant? When was your last menstrual period?       No  Protocols used: Back Pain-A-AH

## 2023-12-04 NOTE — Patient Instructions (Signed)

## 2023-12-04 NOTE — Telephone Encounter (Signed)
 Will discuss at upcoming appt today

## 2023-12-11 ENCOUNTER — Other Ambulatory Visit: Payer: Self-pay | Admitting: Internal Medicine

## 2023-12-12 ENCOUNTER — Ambulatory Visit: Payer: Self-pay
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
## 2023-12-12 NOTE — Addendum Note (Signed)
 Addended by: EDMAN MARSA PARAS on: 12/12/2023 07:18 PM   Modules accepted: Orders
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
## 2023-12-13 NOTE — Telephone Encounter (Signed)
 She does not seem like she is doing well with the medications.  I believe she is still working which is likely exacerbating her symptoms.  My suggestion would be MRI and physical therapy.

## 2023-12-13 NOTE — Telephone Encounter (Signed)
 Requested medication (s) are due for refill today: yes  Requested medication (s) are on the active medication list: yes  Last refill:  12/04/23  Future visit scheduled: {Yes  Notes to clinic:  Unable to refill per protocol, cannot delegate.      Requested Prescriptions  Pending Prescriptions Disp Refills   tizanidine  (ZANAFLEX ) 2 MG capsule [Pharmacy Med Name: TIZANIDINE  HCL 2 MG CAPSULE] 90 capsule 1    Sig: Take 1 capsule (2 mg total) by mouth at bedtime as needed for muscle spasms.     Not Delegated - Cardiovascular:  Alpha-2 Agonists - tizanidine  Failed - 12/13/2023  8:34 AM      Failed - This refill cannot be delegated      Passed - Valid encounter within last 6 months    Recent Outpatient Visits           1 week ago Chronic midline low back pain with right-sided sciatica   [facility] [place], Angeline ORN, NP   2 months ago Acute non-recurrent pansinusitis   [place] [facility], [place] W, NP   3 months ago Acute bilateral low back pain with bilateral sciatica   [place] [facility] [place] Marsa PARAS, DO   6 months ago Acute non-recurrent frontal sinusitis   [place] [facility] [place] Marsa PARAS, DO   6 months ago Viral URI with cough    [facility], Angeline ORN, [place]

## 2023-12-13 NOTE — Telephone Encounter (Signed)
 Appointment made for Monday afternoon, should she come a little earlier then 4 to have xray done or do that after she sees you

## 2023-12-13 NOTE — Telephone Encounter (Signed)
 This might be an automatic pharmacy request. She has an apt on Monday with me to discuss her pain. I will ask her on Monday  Marsa Officer, DO [facility] 12/13/2023, 2:26 PM

## 2023-12-14 ENCOUNTER — Ambulatory Visit: Admitting: Family Medicine

## 2023-12-17 ENCOUNTER — Other Ambulatory Visit: Payer: Self-pay | Admitting: Family Medicine

## 2023-12-17 ENCOUNTER — Ambulatory Visit (INDEPENDENT_AMBULATORY_CARE_PROVIDER_SITE_OTHER): Admitting: Family Medicine

## 2023-12-17 ENCOUNTER — Encounter: Payer: Self-pay | Admitting: Family Medicine

## 2023-12-17 VITALS — BP 126/63 | HR 75 | Wt 167.6 lb

## 2023-12-17 DIAGNOSIS — Z0000 Encounter for general adult medical examination without abnormal findings: Secondary | ICD-10-CM

## 2023-12-17 DIAGNOSIS — R7309 Other abnormal glucose: Secondary | ICD-10-CM

## 2023-12-17 DIAGNOSIS — M5441 Lumbago with sciatica, right side: Secondary | ICD-10-CM | POA: Diagnosis not present

## 2023-12-17 DIAGNOSIS — G8929 Other chronic pain: Secondary | ICD-10-CM

## 2023-12-17 DIAGNOSIS — I10 Essential (primary) hypertension: Secondary | ICD-10-CM

## 2023-12-17 DIAGNOSIS — E7800 Pure hypercholesterolemia, unspecified: Secondary | ICD-10-CM

## 2023-12-17 MED ORDER — PREDNISONE 20 MG PO TABS: ORAL_TABLET | ORAL | 0 refills | Status: DC

## 2023-12-17 NOTE — Patient Instructions (Addendum)
 Thank you for coming to the office today.  Walk in for [facility] tomorrow BEFORE 115am lab.  Back X-ray  Prednisone  taper this week  And a physical next week  Please schedule a Follow-up Appointment to: Return if symptoms worsen or fail to improve.  If you have any other questions or concerns, please feel free to call the office or send a message through MyChart. You may also schedule an earlier appointment if necessary.  Additionally, you may be receiving a survey about your experience at our office within a few days to 1 week by e-mail or mail. We value your feedback.  Marsa Officer, DO [facility], [place]
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
## 2023-12-18 ENCOUNTER — Other Ambulatory Visit

## 2023-12-18 ENCOUNTER — Ambulatory Visit
Admission: RE | Admit: 2023-12-18 | Discharge: 2023-12-18 | Disposition: A | Discharge status: Home / Self Care | Source: Ambulatory Visit | Attending: Family Medicine | Admitting: Family Medicine

## 2023-12-18 ENCOUNTER — Encounter: Payer: Self-pay | Admitting: Oncology

## 2023-12-18 DIAGNOSIS — R7309 Other abnormal glucose: Secondary | ICD-10-CM | POA: Diagnosis not present

## 2023-12-18 DIAGNOSIS — G8929 Other chronic pain: Secondary | ICD-10-CM | POA: Insufficient documentation

## 2023-12-18 DIAGNOSIS — M545 Low back pain, unspecified: Secondary | ICD-10-CM | POA: Diagnosis not present

## 2023-12-18 DIAGNOSIS — M5441 Lumbago with sciatica, right side: Secondary | ICD-10-CM | POA: Insufficient documentation

## 2023-12-18 DIAGNOSIS — I10 Essential (primary) hypertension: Secondary | ICD-10-CM | POA: Diagnosis not present

## 2023-12-18 DIAGNOSIS — M48061 Spinal stenosis, lumbar region without neurogenic claudication: Secondary | ICD-10-CM | POA: Diagnosis not present

## 2023-12-18 DIAGNOSIS — Z0000 Encounter for general adult medical examination without abnormal findings: Secondary | ICD-10-CM | POA: Diagnosis not present

## 2023-12-18 DIAGNOSIS — I700 Atherosclerosis of aorta: Secondary | ICD-10-CM | POA: Diagnosis not present

## 2023-12-18 DIAGNOSIS — E7800 Pure hypercholesterolemia, unspecified: Secondary | ICD-10-CM | POA: Diagnosis not present

## 2023-12-19 ENCOUNTER — Encounter: Payer: Self-pay | Admitting: Oncology

## 2023-12-19 ENCOUNTER — Ambulatory Visit: Payer: Self-pay | Admitting: Family Medicine

## 2023-12-19 LAB — CBC WITH DIFFERENTIAL/PLATELET
Absolute Lymphocytes: 1609 {cells}/uL (ref 850–3900)
Absolute Monocytes: 340 {cells}/uL (ref 200–950)
Basophils Absolute: 32 {cells}/uL (ref 0–200)
Basophils Relative: 0.6 %
Eosinophils Absolute: 92 {cells}/uL (ref 15–500)
Eosinophils Relative: 1.7 %
HCT: 42.5 % (ref 35.0–45.0)
Hemoglobin: 13.8 g/dL (ref 11.7–15.5)
MCH: 28.5 pg (ref 27.0–33.0)
MCHC: 32.5 g/dL (ref 32.0–36.0)
MCV: 87.6 fL (ref 80.0–100.0)
Monocytes Relative: 6.3 %
Neutro Abs: 3326 {cells}/uL (ref 1500–7800)
Neutrophils Relative %: 61.6 %
Platelets: 20 Thousand/uL — ABNORMAL LOW (ref 140–400)
RBC: 4.85 Million/uL (ref 3.80–5.10)
RDW: 14.6 % (ref 11.0–15.0)
Total Lymphocyte: 29.8 %
WBC: 5.4 Thousand/uL (ref 3.8–10.8)

## 2023-12-19 LAB — COMPREHENSIVE METABOLIC PANEL WITH GFR
AG Ratio: 1.7 (calc) (ref 1.0–2.5)
ALT: 14 U/L (ref 6–29)
AST: 16 U/L (ref 10–35)
Albumin: 4.3 g/dL (ref 3.6–5.1)
Alkaline phosphatase (APISO): 71 U/L (ref 37–153)
BUN: 11 mg/dL (ref 7–25)
CO2: 29 mmol/L (ref 20–32)
Calcium: 9.3 mg/dL (ref 8.6–10.4)
Chloride: 101 mmol/L (ref 98–110)
Creat: 0.93 mg/dL (ref 0.50–1.05)
Globulin: 2.6 g/dL (ref 1.9–3.7)
Glucose, Bld: 84 mg/dL (ref 65–99)
Potassium: 2.9 mmol/L — ABNORMAL LOW (ref 3.5–5.3)
Sodium: 139 mmol/L (ref 135–146)
Total Bilirubin: 0.5 mg/dL (ref 0.2–1.2)
Total Protein: 6.9 g/dL (ref 6.1–8.1)
eGFR: 68 mL/min/1.73m2 (ref >=60)

## 2023-12-19 LAB — HEMOGLOBIN A1C
Hgb A1c MFr Bld: 5.8 % — ABNORMAL HIGH (ref <5.7)
Mean Plasma Glucose: 120 mg/dL
eAG (mmol/L): 6.6 mmol/L

## 2023-12-19 LAB — LIPID PANEL
Cholesterol: 275 mg/dL — ABNORMAL HIGH (ref <200)
HDL: 65 mg/dL (ref >=50)
LDL Cholesterol (Calc): 191 mg/dL — ABNORMAL HIGH
Non-HDL Cholesterol (Calc): 210 mg/dL — ABNORMAL HIGH (ref <130)
Total CHOL/HDL Ratio: 4.2 (calc) (ref <5.0)
Triglycerides: 81 mg/dL (ref <150)

## 2023-12-19 LAB — TSH: TSH: 0.88 m[IU]/L (ref 0.40–4.50)

## 2023-12-21 ENCOUNTER — Encounter: Payer: Self-pay | Admitting: Oncology

## 2023-12-21 ENCOUNTER — Inpatient Hospital Stay: Attending: Oncology

## 2023-12-21 ENCOUNTER — Inpatient Hospital Stay: Admitting: Oncology

## 2023-12-21 VITALS — BP 126/68 | HR 66 | Temp 97.9°F | Resp 18 | Ht 63.0 in | Wt 168.0 lb

## 2023-12-21 DIAGNOSIS — Z79899 Other long term (current) drug therapy: Secondary | ICD-10-CM | POA: Insufficient documentation

## 2023-12-21 DIAGNOSIS — D693 Immune thrombocytopenic purpura: Secondary | ICD-10-CM

## 2023-12-21 DIAGNOSIS — F17210 Nicotine dependence, cigarettes, uncomplicated: Secondary | ICD-10-CM | POA: Diagnosis not present

## 2023-12-21 LAB — CBC WITH DIFFERENTIAL/PLATELET
Abs Immature Granulocytes: 0.04 K/uL (ref 0.00–0.07)
Basophils Absolute: 0.1 K/uL (ref 0.0–0.1)
Basophils Relative: 1 %
Eosinophils Absolute: 0.1 K/uL (ref 0.0–0.5)
Eosinophils Relative: 1 %
HCT: 42.1 % (ref 36.0–46.0)
Hemoglobin: 14.1 g/dL (ref 12.0–15.0)
Immature Granulocytes: 0 %
Lymphocytes Relative: 30 %
Lymphs Abs: 2.8 K/uL (ref 0.7–4.0)
MCH: 28.4 pg (ref 26.0–34.0)
MCHC: 33.5 g/dL (ref 30.0–36.0)
MCV: 84.9 fL (ref 80.0–100.0)
Monocytes Absolute: 0.6 K/uL (ref 0.1–1.0)
Monocytes Relative: 7 %
Neutro Abs: 5.5 K/uL (ref 1.7–7.7)
Neutrophils Relative %: 61 %
Platelets: 22 K/uL — ABNORMAL LOW (ref 150–400)
RBC: 4.96 MIL/uL (ref 3.87–5.11)
RDW: 14.1 % (ref 11.5–15.5)
WBC: 9.1 K/uL (ref 4.0–10.5)
nRBC: 0 % (ref 0.0–0.2)

## 2023-12-21 NOTE — Progress Notes (Signed)
 Patient is feeling very fatigued, she is also having some back pain.

## 2023-12-21 NOTE — Progress Notes (Signed)
 Pharmacist Chemotherapy Monitoring - Initial Assessment    Anticipated start date: 12/25/23   The following has been reviewed per standard work regarding the patient's treatment regimen: The patient's diagnosis, treatment plan and drug doses, and organ/hematologic function Lab orders and baseline tests specific to treatment regimen  The treatment plan start date, drug sequencing, and pre-medications Prior authorization status  Patient's documented medication list, including drug-drug interaction screen and prescriptions for anti-emetics and supportive care specific to the treatment regimen The drug concentrations, fluid compatibility, administration routes, and timing of the medications to be used The patient's access for treatment and lifetime cumulative dose history, if applicable  The patient's medication allergies and previous infusion related reactions, if applicable    

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
## 2023-12-25 ENCOUNTER — Inpatient Hospital Stay

## 2023-12-25 ENCOUNTER — Inpatient Hospital Stay: Admitting: Nurse Practitioner

## 2023-12-25 ENCOUNTER — Encounter: Payer: Self-pay | Admitting: Oncology

## 2023-12-25 ENCOUNTER — Inpatient Hospital Stay (HOSPITAL_BASED_OUTPATIENT_CLINIC_OR_DEPARTMENT_OTHER): Admitting: Oncology

## 2023-12-25 ENCOUNTER — Inpatient Hospital Stay: Attending: Oncology

## 2023-12-25 VITALS — BP 127/63 | HR 65 | Temp 96.3°F | Resp 18 | Ht 63.0 in | Wt 166.2 lb

## 2023-12-25 DIAGNOSIS — T8090XA Unspecified complication following infusion and therapeutic injection, initial encounter: Secondary | ICD-10-CM

## 2023-12-25 DIAGNOSIS — Z79899 Other long term (current) drug therapy: Secondary | ICD-10-CM | POA: Insufficient documentation

## 2023-12-25 DIAGNOSIS — R070 Pain in throat: Secondary | ICD-10-CM

## 2023-12-25 DIAGNOSIS — D693 Immune thrombocytopenic purpura: Secondary | ICD-10-CM | POA: Insufficient documentation

## 2023-12-25 DIAGNOSIS — F17210 Nicotine dependence, cigarettes, uncomplicated: Secondary | ICD-10-CM | POA: Insufficient documentation

## 2023-12-25 DIAGNOSIS — T451X5A Adverse effect of antineoplastic and immunosuppressive drugs, initial encounter: Secondary | ICD-10-CM

## 2023-12-25 DIAGNOSIS — Z5111 Encounter for antineoplastic chemotherapy: Secondary | ICD-10-CM | POA: Insufficient documentation

## 2023-12-25 LAB — CBC WITH DIFFERENTIAL/PLATELET
Abs Immature Granulocytes: 0.04 K/uL (ref 0.00–0.07)
Basophils Absolute: 0 K/uL (ref 0.0–0.1)
Basophils Relative: 1 %
Eosinophils Absolute: 0.1 K/uL (ref 0.0–0.5)
Eosinophils Relative: 1 %
HCT: 41.4 % (ref 36.0–46.0)
Hemoglobin: 14.1 g/dL (ref 12.0–15.0)
Immature Granulocytes: 1 %
Lymphocytes Relative: 27 %
Lymphs Abs: 2.1 K/uL (ref 0.7–4.0)
MCH: 28.8 pg (ref 26.0–34.0)
MCHC: 34.1 g/dL (ref 30.0–36.0)
MCV: 84.7 fL (ref 80.0–100.0)
Monocytes Absolute: 0.5 K/uL (ref 0.1–1.0)
Monocytes Relative: 6 %
Neutro Abs: 5.1 K/uL (ref 1.7–7.7)
Neutrophils Relative %: 64 %
Platelets: 35 K/uL — ABNORMAL LOW (ref 150–400)
RBC: 4.89 MIL/uL (ref 3.87–5.11)
RDW: 13.9 % (ref 11.5–15.5)
WBC: 7.9 K/uL (ref 4.0–10.5)
nRBC: 0 % (ref 0.0–0.2)

## 2023-12-25 MED ORDER — DIPHENHYDRAMINE HCL 50 MG/ML IJ SOLN
25.0000 mg | Freq: Once | INTRAMUSCULAR | Status: AC
  Administered 2023-12-25: 25 mg via INTRAVENOUS
  Filled 2023-12-25: qty 1

## 2023-12-25 MED ORDER — DIPHENHYDRAMINE HCL 50 MG/ML IJ SOLN
50.0000 mg | Freq: Once | INTRAMUSCULAR | Status: AC | PRN
  Administered 2023-12-25: 25 mg via INTRAVENOUS

## 2023-12-25 MED ORDER — METHYLPREDNISOLONE SODIUM SUCC 125 MG IJ SOLR
125.0000 mg | Freq: Once | INTRAMUSCULAR | Status: AC | PRN
  Administered 2023-12-25: 125 mg via INTRAVENOUS

## 2023-12-25 MED ORDER — ACETAMINOPHEN 325 MG PO TABS
650.0000 mg | ORAL_TABLET | Freq: Once | ORAL | Status: AC
  Administered 2023-12-25: 650 mg via ORAL
  Filled 2023-12-25: qty 2

## 2023-12-25 MED ORDER — DEXAMETHASONE SODIUM PHOSPHATE 10 MG/ML IJ SOLN
10.0000 mg | Freq: Once | INTRAMUSCULAR | Status: AC
  Administered 2023-12-25: 10 mg via INTRAVENOUS
  Filled 2023-12-25: qty 1

## 2023-12-25 MED ORDER — SODIUM CHLORIDE 0.9 % IV SOLN
375.0000 mg/m2 | Freq: Once | INTRAVENOUS | Status: AC
  Administered 2023-12-25: 700 mg via INTRAVENOUS
  Filled 2023-12-25: qty 50

## 2023-12-25 MED ORDER — FAMOTIDINE IN NACL 20-0.9 MG/50ML-% IV SOLN
20.0000 mg | Freq: Once | INTRAVENOUS | Status: AC
  Administered 2023-12-25: 20 mg via INTRAVENOUS
  Filled 2023-12-25: qty 50

## 2023-12-25 MED ORDER — SODIUM CHLORIDE 0.9 % IV SOLN
Freq: Once | INTRAVENOUS | Status: AC
  Filled 2023-12-25: qty 250

## 2023-12-25 MED ORDER — SODIUM CHLORIDE 0.9 % IV SOLN
Freq: Once | INTRAVENOUS | Status: DC | PRN
  Filled 2023-12-25: qty 250
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
## 2023-12-25 NOTE — Progress Notes (Signed)
 Symptom Management Clinic  [facility] A Department of the [place]. [facility] [address] [place], [place] [address] [phone] (phone) [phone] (fax)  Patient Care Team: Edman Marsa PARAS, DO as PCP - General (Family Medicine) Vincente Grip, MD as Consulting Physician (Psychiatry) Jacobo Evalene PARAS, MD as Consulting Physician (Oncology) Merlynn Lyle CROME, LCSW as Social Worker (Licensed Clinical Social Worker) Carolee Manus DASEN., MD (Ophthalmology)   Name of the patient: Erica Bartlett  [id number]  30-Nov-1955   Date of visit: 12/25/23  Diagnosis- ITP  Chief complaint/ Reason for visit-infusion reaction  Heme/Onc history:  Oncology History   No history exists.    Interval history-patient receiving rituximab .  Was seen earlier today by Dr. Jacobo.  Reported funny feeling in her throat during infusion.  Nursing stopped infusion and I was called for evaluation.  Review of systems- Review of Systems  HENT:  Positive for sore throat. Negative for ear pain, sinus pain and tinnitus.   Eyes:  Negative for pain.  Respiratory:  Negative for cough, sputum production, shortness of breath, wheezing and stridor.   Cardiovascular:  Negative for chest pain.    Allergies  Allergen Reactions   Doxycycline  Nausea And Vomiting   Penicillins Hives   Past Medical History:  Diagnosis Date   Anxiety    Cancer (HCC)    Chronic maxillary sinusitis    Hypertension    Hypokalemia    Idiopathic thrombocytopenic purpura (HCC)    Osteoarthritis of spine with radiculopathy, cervical region     Current Outpatient Medications:    acetaminophen  (TYLENOL ) 500 MG tablet, Take 500 mg by mouth every 6 (six) hours as needed., Disp: , Rfl:    amLODipine  (NORVASC ) 10 MG tablet, TAKE 1 TABLET BY MOUTH EVERY DAY, Disp: 90 tablet, Rfl: 1   amoxicillin -clavulanate (AUGMENTIN ) 875-125 MG tablet, Take 1 tablet by mouth 2 (two)  times daily., Disp: 20 tablet, Rfl: 0   citalopram (CELEXA) 40 MG tablet, Take 40 mg by mouth daily., Disp: , Rfl:    clonazePAM  (KLONOPIN ) 1 MG tablet, Take 1-2 mg by mouth 3 (three) times daily as needed. Reported on 10/11/2015, Disp: , Rfl:    cyclobenzaprine  (FLEXERIL ) 10 MG tablet, TAKE 0.5-1 TABLETS (5-10 MG TOTAL) BY MOUTH 2 (TWO) TIMES DAILY AS NEEDED FOR MUSCLE SPASMS., Disp: 60 tablet, Rfl: 1   fluticasone  (FLONASE ) 50 MCG/ACT nasal spray, Place 2 sprays into both nostrils daily., Disp: 16 g, Rfl: 11   hydrochlorothiazide  (HYDRODIURIL ) 25 MG tablet, TAKE 1 TABLET (25 MG TOTAL) BY MOUTH DAILY., Disp: 90 tablet, Rfl: 1   KLOR-CON  M20 20 MEQ tablet, TAKE 1 TABLET BY MOUTH EVERY DAY, Disp: 90 tablet, Rfl: 1   Lidocaine -Salicylic Acid  5-40 % GEL, Apply 1 Application topically daily., Disp: 60 g, Rfl: 2   naproxen  (NAPROSYN ) 500 MG tablet, Take 1 tablet (500 mg total) by mouth 2 (two) times daily with a meal., Disp: 14 tablet, Rfl: 0   ondansetron  (ZOFRAN ) 4 MG tablet, Take 1 tablet (4 mg total) by mouth every 8 (eight) hours as needed for nausea or vomiting., Disp: 60 tablet, Rfl: 2   pantoprazole  (PROTONIX ) 40 MG tablet, TAKE 1 TABLET BY MOUTH EVERY DAY, Disp: 90 tablet, Rfl: 3   predniSONE  (DELTASONE ) 20 MG tablet, Take daily with food. Start with 60mg  (3 pills) x 2 days, then reduce to 40mg  (2 pills) x 2 days, then 20mg  (1 pill) x 3 days, Disp: 13 tablet, Rfl:  0   prochlorperazine  (COMPAZINE ) 10 MG tablet, Take 1 tablet (10 mg total) by mouth every 6 (six) hours as needed for nausea or vomiting., Disp: 30 tablet, Rfl: 0   tizanidine  (ZANAFLEX ) 2 MG capsule, Take 1 capsule (2 mg total) by mouth at bedtime as needed for muscle spasms., Disp: 15 capsule, Rfl: 0   VENTOLIN  HFA 108 (90 Base) MCG/ACT inhaler, Inhale 1-2 puffs into the lungs every 6 (six) hours as needed for wheezing or shortness of breath., Disp: 18 g, Rfl: 2 No current facility-administered medications for this  visit.  Facility-Administered Medications Ordered in Other Visits:    0.9 %  sodium chloride  infusion, , Intravenous, Once PRN, Finnegan, Timothy J, MD   diphenhydrAMINE  (BENADRYL ) injection 50 mg, 50 mg, Intravenous, Once PRN, Finnegan, Timothy J, MD   methylPREDNISolone  sodium succinate  (SOLU-MEDROL ) 125 mg/2 mL injection 125 mg, 125 mg, Intravenous, Once PRN, Jacobo, Timothy J, MD  Physical exam: There were no vitals filed for this visit. Physical Exam Vitals reviewed.  Constitutional:      General: She is not in acute distress.    Appearance: She is not ill-appearing.  Cardiovascular:     Rate and Rhythm: Normal rate and regular rhythm.  Pulmonary:     Effort: No respiratory distress.     Breath sounds: No stridor. No wheezing.  Skin:    Findings: No rash.  Neurological:     Mental Status: She is alert and oriented to person, place, and time.     Assessment and plan- Patient is a 68 y.o. female currently receiving rituximab  for ITP per Dr. Jacobo, who presents for symptom management clinic evaluation of hypersensitivity reaction.  She has a known hypersensitivity reaction in the past to the same drug.  She reported scratchy throat and fullness during recent bump up of right.  Rituximab  was stopped.  Normal saline started.  I ordered 25 mg of IV Benadryl .  Symptoms persisted and we proceeded with 125 mg of IV Solu-Medrol .  Saline was continued.  Medication continued to be held.  Symptoms then resolved.  I advised for her to receive 500 cc of IV fluids.  Once symptoms have completely resolved, restart medication at last tolerated rate.  She tolerated rechallenge well without significant side effects.  Discussed that this is not a medication allergy but a common hypersensitivity/rate based reaction.  We will continue to uptitrate throughout the day as planned.   Visit Diagnosis 1. Infusion reaction, initial encounter   2. Adverse effect of rituximab  infusion    Patient expressed  understanding and was in agreement with this plan. She also understands that She can call clinic at any time with any questions, concerns, or complaints.   Thank you for allowing me to participate in the care of this very pleasant patient.   Tinnie Dawn, DNP, AGNP-C, AOCNP [facility] [phone]

## 2023-12-25 NOTE — Progress Notes (Signed)
 Pt known to have hypersensitivity reaction to rituxan  in past  1017- called over by pt for that funny feeling in throat- Med stopped, NS started, 25 mg Benadryl  given, L ALLEN PA called.  Pt asking for ginger ale.  1019- L Allen at chairside, Solumedrol 125 IV given  1022-  123/58  HR 67 sat 100% Symptoms slowly resolving./ Per Tinnie dawn restart at rate when symptoms resolve, do not need to finish entire bag of fluids   1033- getting better  1043- symptoms gone, Med restarted

## 2023-12-25 NOTE — Progress Notes (Signed)
 Patient here today for Rituxan . MD to see patient in infusion.

## 2023-12-25 NOTE — Progress Notes (Signed)
 Rapid Infusion Rituximab  Pharmacist Evaluation  Erica Bartlett is a 68 y.o. female being treated with rituximab  for ITP. This patient may not be considered for RIR.   A pharmacist has verified the patient tolerated rituximab  infusions per the [facility] standard infusion protocol without grade 3-4 infusion reactions. The treatment plan will be updated to reflect RIR if the patient qualifies per the checklist below:   Age > 40 years old Yes   Clinically significant cardiovascular disease No   Circulating lymphocyte count < 5000/uL prior to cycle two Yes  Lab Results  Component Value Date   LYMPHSABS 2.1 12/25/2023    Prior documented grade 3-4 infusion reaction to rituximab  No   Prior documented grade 1-2 infusion reaction to rituximab  (If YES, Pharmacist will confirm with Physician if patient is still a candidate for RIR) Yes   Previous rituximab  infusion within the past 6 months Yes   Treatment Plan updated orders to reflect RIR No    [name] does not meet the criteria for Rapid Infusion Rituximab . This patient is not going to be switched to rapid infusion rituximab .   Maudie FORBES Andreas, PharmD, BCPS Clinical Pharmacist   12/25/23 4:22 PM

## 2023-12-25 NOTE — Patient Instructions (Signed)
 [facility] - A DEPT OF [facility]  Discharge Instructions: Thank you for choosing [facility] to provide your oncology and hematology care.  If you have a lab appointment with the Cancer Center, please go directly to the Cancer Center and check in at the registration area.  Wear comfortable clothing and clothing appropriate for easy access to any Portacath or PICC line.   We strive to give you quality time with your provider. You may need to reschedule your appointment if you arrive late (15 or more minutes).  Arriving late affects you and other patients whose appointments are after yours.  Also, if you miss three or more appointments without notifying the office, you may be dismissed from the clinic at the provider's discretion.      For prescription refill requests, have your pharmacy contact our office and allow 72 hours for refills to be completed.    Today you received the following chemotherapy and/or immunotherapy agents TRUXIMA      To help prevent nausea and vomiting after your treatment, we encourage you to take your nausea medication as directed.  BELOW ARE SYMPTOMS THAT SHOULD BE REPORTED IMMEDIATELY: *FEVER GREATER THAN 100.4 F (38 C) OR HIGHER *CHILLS OR SWEATING *NAUSEA AND VOMITING THAT IS NOT CONTROLLED WITH YOUR NAUSEA MEDICATION *UNUSUAL SHORTNESS OF BREATH *UNUSUAL BRUISING OR BLEEDING *URINARY PROBLEMS (pain or burning when urinating, or frequent urination) *BOWEL PROBLEMS (unusual diarrhea, constipation, pain near the anus) TENDERNESS IN MOUTH AND THROAT WITH OR WITHOUT PRESENCE OF ULCERS (sore throat, sores in mouth, or a toothache) UNUSUAL RASH, SWELLING OR PAIN  UNUSUAL VAGINAL DISCHARGE OR ITCHING   Items with * indicate a potential emergency and should be followed up as soon as possible or go to the Emergency Department if any problems should occur.  Please show the CHEMOTHERAPY ALERT CARD or IMMUNOTHERAPY  ALERT CARD at check-in to the Emergency Department and triage nurse.  Should you have questions after your visit or need to cancel or reschedule your appointment, please contact [facility] - A DEPT OF [facility]  [phone] and follow the prompts.  Office hours are 8:00 a.m. to 4:30 p.m. Monday - Friday. Please note that voicemails left after 4:00 p.m. may not be returned until the following business day.  We are closed weekends and major holidays. You have access to a nurse at all times for urgent questions. Please call the main number to the clinic [phone] and follow the prompts.  For any non-urgent questions, you may also contact your provider using MyChart. We now offer e-Visits for anyone 87 and older to request care online for non-urgent symptoms. For details visit [email or web address].   Also download the MyChart app! Go to the app store, search "MyChart", open the app, select [facility], and log in with your MyChart username and password.  Rituximab Injection What is this medication? RITUXIMAB (ri TUX i mab) treats leukemia and lymphoma. It works by blocking a protein that causes cancer cells to grow and multiply. This helps to slow or stop the spread of cancer cells. It may also be used to treat autoimmune conditions, such as arthritis. It works by slowing down an overactive immune system. It is a monoclonal antibody. This medicine may be used for other purposes; ask your health care provider or pharmacist if you have questions. COMMON BRAND NAME(S): RIABNI, Rituxan, RUXIENCE, truxima What should I tell my care  team before I take this medication? They need to know if you have any of these conditions: Chest pain Heart disease Immune system problems Infection, such as chickenpox, cold sores, hepatitis B, herpes Irregular heartbeat or rhythm Kidney disease Low blood counts, such as low white cells, platelets, red cells Lung disease Recent or  upcoming vaccine An unusual or allergic reaction to rituximab, other medications, foods, dyes, or preservatives Pregnant or trying to get pregnant Breast-feeding How should I use this medication? This medication is injected into a vein. It is given by a care team in a hospital or clinic setting. A special MedGuide will be given to you before each treatment. Be sure to read this information carefully each time. Talk to your care team about the use of this medication in children. While this medication may be prescribed for children as young as 6 months for selected conditions, precautions do apply. Overdosage: If you think you have taken too much of this medicine contact a poison control center or emergency room at once. NOTE: This medicine is only for you. Do not share this medicine with others. What if I miss a dose? Keep appointments for follow-up doses. It is important not to miss your dose. Call your care team if you are unable to keep an appointment. What may interact with this medication? Do not take this medication with any of the following: Live vaccines This medication may also interact with the following: Cisplatin This list may not describe all possible interactions. Give your health care provider a list of all the medicines, herbs, non-prescription drugs, or dietary supplements you use. Also tell them if you smoke, drink alcohol, or use illegal drugs. Some items may interact with your medicine. What should I watch for while using this medication? Your condition will be monitored carefully while you are receiving this medication. You may need blood work while taking this medication. This medication can cause serious infusion reactions. To reduce the risk your care team may give you other medications to take before receiving this one. Be sure to follow the directions from your care team. This medication may increase your risk of getting an infection. Call your care team for advice if  you get a fever, chills, sore throat, or other symptoms of a cold or flu. Do not treat yourself. Try to avoid being around people who are sick. Call your care team if you are around anyone with measles, chickenpox, or if you develop sores or blisters that do not heal properly. Avoid taking medications that contain aspirin, acetaminophen, ibuprofen, naproxen, or ketoprofen unless instructed by your care team. These medications may hide a fever. This medication may cause serious skin reactions. They can happen weeks to months after starting the medication. Contact your care team right away if you notice fevers or flu-like symptoms with a rash. The rash may be red or purple and then turn into blisters or peeling of the skin. You may also notice a red rash with swelling of the face, lips, or lymph nodes in your neck or under your arms. In some patients, this medication may cause a serious brain infection that may cause death. If you have any problems seeing, thinking, speaking, walking, or standing, tell your care team right away. If you cannot reach your care team, urgently seek another source of medical care. Talk to your care team if you may be pregnant. Serious birth defects can occur if you take this medication during pregnancy and for 12 months after the  last dose. You will need a negative pregnancy test before starting this medication. Contraception is recommended while taking this medication and for 12 months after the last dose. Your care team can help you find the option that works for you. Do not breastfeed while taking this medication and for at least 6 months after the last dose. What side effects may I notice from receiving this medication? Side effects that you should report to your care team as soon as possible: Allergic reactions or angioedema--skin rash, itching or hives, swelling of the face, eyes, lips, tongue, arms, or legs, trouble swallowing or breathing Bowel blockage--stomach cramping,  unable to have a bowel movement or pass gas, loss of appetite, vomiting Dizziness, loss of balance or coordination, confusion or trouble speaking Heart attack--pain or tightness in the chest, shoulders, arms, or jaw, nausea, shortness of breath, cold or clammy skin, feeling faint or lightheaded Heart rhythm changes--fast or irregular heartbeat, dizziness, feeling faint or lightheaded, chest pain, trouble breathing Infection--fever, chills, cough, sore throat, wounds that don't heal, pain or trouble when passing urine, general feeling of discomfort or being unwell Infusion reactions--chest pain, shortness of breath or trouble breathing, feeling faint or lightheaded Kidney injury--decrease in the amount of urine, swelling of the ankles, hands, or feet Liver injury--right upper belly pain, loss of appetite, nausea, light-colored stool, dark yellow or brown urine, yellowing skin or eyes, unusual weakness or fatigue Redness, blistering, peeling, or loosening of the skin, including inside the mouth Stomach pain that is severe, does not go away, or gets worse Tumor lysis syndrome (TLS)--nausea, vomiting, diarrhea, decrease in the amount of urine, dark urine, unusual weakness or fatigue, confusion, muscle pain or cramps, fast or irregular heartbeat, joint pain Side effects that usually do not require medical attention (report to your care team if they continue or are bothersome): Headache Joint pain Nausea Runny or stuffy nose Unusual weakness or fatigue This list may not describe all possible side effects. Call your doctor for medical advice about side effects. You may report side effects to FDA at 1-800-FDA-1088. Where should I keep my medication? This medication is given in a hospital or clinic. It will not be stored at home. NOTE: This sheet is a summary. It may not cover all possible information. If you have questions about this medicine, talk to your doctor, pharmacist, or health care provider.   2024 Elsevier/Gold Standard (2021-09-01 00:00:00)

## 2023-12-26 ENCOUNTER — Encounter: Admitting: Family Medicine

## 2023-12-31 ENCOUNTER — Encounter: Payer: Self-pay | Admitting: Oncology

## 2024-01-02 ENCOUNTER — Ambulatory Visit: Admitting: Oncology

## 2024-01-02 ENCOUNTER — Ambulatory Visit

## 2024-01-02 ENCOUNTER — Other Ambulatory Visit

## 2024-01-03 ENCOUNTER — Ambulatory Visit: Payer: Self-pay | Admitting: Family Medicine

## 2024-01-03 MED FILL — Dexamethasone Sodium Phosphate Inj 100 MG/10ML: INTRAMUSCULAR | Qty: 2 | Status: AC

## 2024-01-04 ENCOUNTER — Inpatient Hospital Stay

## 2024-01-04 ENCOUNTER — Encounter: Payer: Self-pay | Admitting: Oncology

## 2024-01-04 ENCOUNTER — Inpatient Hospital Stay (HOSPITAL_BASED_OUTPATIENT_CLINIC_OR_DEPARTMENT_OTHER): Admitting: Oncology

## 2024-01-04 VITALS — BP 130/59 | HR 72 | Temp 96.0°F | Resp 18 | Wt 168.3 lb

## 2024-01-04 DIAGNOSIS — D693 Immune thrombocytopenic purpura: Secondary | ICD-10-CM

## 2024-01-04 DIAGNOSIS — R112 Nausea with vomiting, unspecified: Secondary | ICD-10-CM

## 2024-01-04 DIAGNOSIS — Z5111 Encounter for antineoplastic chemotherapy: Secondary | ICD-10-CM | POA: Diagnosis not present

## 2024-01-04 LAB — CBC WITH DIFFERENTIAL/PLATELET
Abs Immature Granulocytes: 0.04 K/uL (ref 0.00–0.07)
Basophils Absolute: 0.1 K/uL (ref 0.0–0.1)
Basophils Relative: 1 %
Eosinophils Absolute: 0.1 K/uL (ref 0.0–0.5)
Eosinophils Relative: 1 %
HCT: 41.7 % (ref 36.0–46.0)
Hemoglobin: 13.8 g/dL (ref 12.0–15.0)
Immature Granulocytes: 1 %
Lymphocytes Relative: 29 %
Lymphs Abs: 2.1 K/uL (ref 0.7–4.0)
MCH: 28.1 pg (ref 26.0–34.0)
MCHC: 33.1 g/dL (ref 30.0–36.0)
MCV: 84.9 fL (ref 80.0–100.0)
Monocytes Absolute: 0.5 K/uL (ref 0.1–1.0)
Monocytes Relative: 7 %
Neutro Abs: 4.6 K/uL (ref 1.7–7.7)
Neutrophils Relative %: 61 %
Platelets: 94 K/uL — ABNORMAL LOW (ref 150–400)
RBC: 4.91 MIL/uL (ref 3.87–5.11)
RDW: 14.4 % (ref 11.5–15.5)
WBC: 7.4 K/uL (ref 4.0–10.5)
nRBC: 0 % (ref 0.0–0.2)

## 2024-01-04 MED ORDER — SODIUM CHLORIDE 0.9 % IV SOLN
Freq: Once | INTRAVENOUS | Status: AC
  Filled 2024-01-04: qty 250

## 2024-01-04 MED ORDER — FAMOTIDINE IN NACL 20-0.9 MG/50ML-% IV SOLN
20.0000 mg | Freq: Once | INTRAVENOUS | Status: AC
  Administered 2024-01-04: 20 mg via INTRAVENOUS
  Filled 2024-01-04: qty 50

## 2024-01-04 MED ORDER — SODIUM CHLORIDE 0.9 % IV SOLN
20.0000 mg | Freq: Once | INTRAVENOUS | Status: AC
  Administered 2024-01-04: 20 mg via INTRAVENOUS
  Filled 2024-01-04: qty 20

## 2024-01-04 MED ORDER — ONDANSETRON HCL 8 MG PO TABS: 8.0000 mg | ORAL_TABLET | Freq: Three times a day (TID) | ORAL | 1 refills | Status: AC | PRN

## 2024-01-04 MED ORDER — DIPHENHYDRAMINE HCL 50 MG/ML IJ SOLN
50.0000 mg | Freq: Once | INTRAMUSCULAR | Status: AC
  Administered 2024-01-04: 50 mg via INTRAVENOUS
  Filled 2024-01-04: qty 1

## 2024-01-04 MED ORDER — SODIUM CHLORIDE 0.9 % IV SOLN
375.0000 mg/m2 | Freq: Once | INTRAVENOUS | Status: AC
  Administered 2024-01-04: 700 mg via INTRAVENOUS
  Filled 2024-01-04: qty 50

## 2024-01-04 MED ORDER — ONDANSETRON HCL 4 MG/2ML IJ SOLN
8.0000 mg | Freq: Once | INTRAMUSCULAR | Status: AC
  Administered 2024-01-04: 8 mg via INTRAVENOUS
  Filled 2024-01-04: qty 4

## 2024-01-04 MED ORDER — ACETAMINOPHEN 325 MG PO TABS
650.0000 mg | ORAL_TABLET | Freq: Once | ORAL | Status: AC
  Administered 2024-01-04: 650 mg via ORAL
  Filled 2024-01-04: qty 2

## 2024-01-04 NOTE — Progress Notes (Signed)
 Patient here today for follow up regarding thrombocytopenia, Rituxan .

## 2024-01-04 NOTE — Progress Notes (Signed)
 Okay to run as a subsequent infusion per Dr. Jacobo
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
## 2024-01-08 ENCOUNTER — Other Ambulatory Visit

## 2024-01-08 ENCOUNTER — Ambulatory Visit: Admitting: Oncology

## 2024-01-08 ENCOUNTER — Ambulatory Visit

## 2024-01-10 MED FILL — Dexamethasone Sodium Phosphate Inj 100 MG/10ML: INTRAMUSCULAR | Qty: 2 | Status: AC

## 2024-01-11 ENCOUNTER — Inpatient Hospital Stay: Admitting: Oncology

## 2024-01-11 ENCOUNTER — Encounter: Payer: Self-pay | Admitting: Oncology

## 2024-01-11 ENCOUNTER — Inpatient Hospital Stay

## 2024-01-11 VITALS — BP 106/61 | HR 75 | Temp 97.1°F | Resp 16

## 2024-01-11 VITALS — BP 133/69 | HR 69 | Temp 96.6°F | Resp 16 | Wt 167.0 lb

## 2024-01-11 DIAGNOSIS — Z5111 Encounter for antineoplastic chemotherapy: Secondary | ICD-10-CM | POA: Diagnosis not present

## 2024-01-11 DIAGNOSIS — D693 Immune thrombocytopenic purpura: Secondary | ICD-10-CM

## 2024-01-11 LAB — CBC WITH DIFFERENTIAL/PLATELET
Abs Immature Granulocytes: 0.05 K/uL (ref 0.00–0.07)
Basophils Absolute: 0.1 K/uL (ref 0.0–0.1)
Basophils Relative: 1 %
Eosinophils Absolute: 0.1 K/uL (ref 0.0–0.5)
Eosinophils Relative: 1 %
HCT: 41.9 % (ref 36.0–46.0)
Hemoglobin: 14.3 g/dL (ref 12.0–15.0)
Immature Granulocytes: 1 %
Lymphocytes Relative: 26 %
Lymphs Abs: 2.1 K/uL (ref 0.7–4.0)
MCH: 28.6 pg (ref 26.0–34.0)
MCHC: 34.1 g/dL (ref 30.0–36.0)
MCV: 83.8 fL (ref 80.0–100.0)
Monocytes Absolute: 0.6 K/uL (ref 0.1–1.0)
Monocytes Relative: 7 %
Neutro Abs: 5.2 K/uL (ref 1.7–7.7)
Neutrophils Relative %: 64 %
Platelets: 84 K/uL — ABNORMAL LOW (ref 150–400)
RBC: 5 MIL/uL (ref 3.87–5.11)
RDW: 14.2 % (ref 11.5–15.5)
WBC: 8 K/uL (ref 4.0–10.5)
nRBC: 0 % (ref 0.0–0.2)

## 2024-01-11 MED ORDER — FAMOTIDINE IN NACL 20-0.9 MG/50ML-% IV SOLN
20.0000 mg | Freq: Once | INTRAVENOUS | Status: AC
  Administered 2024-01-11: 20 mg via INTRAVENOUS
  Filled 2024-01-11: qty 50

## 2024-01-11 MED ORDER — ACETAMINOPHEN 325 MG PO TABS
650.0000 mg | ORAL_TABLET | Freq: Once | ORAL | Status: AC
  Administered 2024-01-11: 650 mg via ORAL
  Filled 2024-01-11: qty 2

## 2024-01-11 MED ORDER — SODIUM CHLORIDE 0.9 % IV SOLN
20.0000 mg | Freq: Once | INTRAVENOUS | Status: AC
  Administered 2024-01-11: 20 mg via INTRAVENOUS
  Filled 2024-01-11: qty 20

## 2024-01-11 MED ORDER — SODIUM CHLORIDE 0.9 % IV SOLN
375.0000 mg/m2 | Freq: Once | INTRAVENOUS | Status: AC
  Administered 2024-01-11: 700 mg via INTRAVENOUS
  Filled 2024-01-11: qty 50

## 2024-01-11 MED ORDER — DIPHENHYDRAMINE HCL 50 MG/ML IJ SOLN
50.0000 mg | Freq: Once | INTRAMUSCULAR | Status: AC
  Administered 2024-01-11: 50 mg via INTRAVENOUS
  Filled 2024-01-11: qty 1

## 2024-01-11 MED ORDER — SODIUM CHLORIDE 0.9 % IV SOLN
Freq: Once | INTRAVENOUS | Status: AC
  Filled 2024-01-11: qty 250
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
## 2024-01-15 ENCOUNTER — Ambulatory Visit

## 2024-01-15 ENCOUNTER — Ambulatory Visit: Admitting: Oncology

## 2024-01-15 ENCOUNTER — Other Ambulatory Visit

## 2024-01-17 MED FILL — Dexamethasone Sodium Phosphate Inj 100 MG/10ML: INTRAMUSCULAR | Qty: 2 | Status: AC

## 2024-01-18 ENCOUNTER — Encounter: Payer: Self-pay | Admitting: Oncology

## 2024-01-18 ENCOUNTER — Inpatient Hospital Stay

## 2024-01-18 ENCOUNTER — Inpatient Hospital Stay (HOSPITAL_BASED_OUTPATIENT_CLINIC_OR_DEPARTMENT_OTHER): Admitting: Oncology

## 2024-01-18 VITALS — BP 130/67 | HR 67 | Temp 97.6°F | Resp 18 | Ht 63.0 in | Wt 165.0 lb

## 2024-01-18 VITALS — BP 121/58 | HR 73 | Temp 97.5°F | Resp 16

## 2024-01-18 DIAGNOSIS — D693 Immune thrombocytopenic purpura: Secondary | ICD-10-CM

## 2024-01-18 DIAGNOSIS — R112 Nausea with vomiting, unspecified: Secondary | ICD-10-CM

## 2024-01-18 DIAGNOSIS — Z5111 Encounter for antineoplastic chemotherapy: Secondary | ICD-10-CM | POA: Diagnosis not present

## 2024-01-18 LAB — CBC WITH DIFFERENTIAL/PLATELET
Abs Immature Granulocytes: 0.06 K/uL (ref 0.00–0.07)
Basophils Absolute: 0 K/uL (ref 0.0–0.1)
Basophils Relative: 0 %
Eosinophils Absolute: 0.1 K/uL (ref 0.0–0.5)
Eosinophils Relative: 1 %
HCT: 41 % (ref 36.0–46.0)
Hemoglobin: 14 g/dL (ref 12.0–15.0)
Immature Granulocytes: 1 %
Lymphocytes Relative: 18 %
Lymphs Abs: 1.4 K/uL (ref 0.7–4.0)
MCH: 28.8 pg (ref 26.0–34.0)
MCHC: 34.1 g/dL (ref 30.0–36.0)
MCV: 84.4 fL (ref 80.0–100.0)
Monocytes Absolute: 0.5 K/uL (ref 0.1–1.0)
Monocytes Relative: 6 %
Neutro Abs: 5.7 K/uL (ref 1.7–7.7)
Neutrophils Relative %: 74 %
Platelets: 151 K/uL (ref 150–400)
RBC: 4.86 MIL/uL (ref 3.87–5.11)
RDW: 14.1 % (ref 11.5–15.5)
WBC: 7.7 K/uL (ref 4.0–10.5)
nRBC: 0 % (ref 0.0–0.2)

## 2024-01-18 MED ORDER — FAMOTIDINE IN NACL 20-0.9 MG/50ML-% IV SOLN
20.0000 mg | Freq: Once | INTRAVENOUS | Status: AC
  Administered 2024-01-18: 20 mg via INTRAVENOUS
  Filled 2024-01-18: qty 50

## 2024-01-18 MED ORDER — DIPHENHYDRAMINE HCL 50 MG/ML IJ SOLN
50.0000 mg | Freq: Once | INTRAMUSCULAR | Status: AC
  Administered 2024-01-18: 50 mg via INTRAVENOUS
  Filled 2024-01-18: qty 1

## 2024-01-18 MED ORDER — SODIUM CHLORIDE 0.9 % IV SOLN
Freq: Once | INTRAVENOUS | Status: AC
  Filled 2024-01-18: qty 250

## 2024-01-18 MED ORDER — SODIUM CHLORIDE 0.9 % IV SOLN
20.0000 mg | Freq: Once | INTRAVENOUS | Status: AC
  Administered 2024-01-18: 20 mg via INTRAVENOUS
  Filled 2024-01-18: qty 20

## 2024-01-18 MED ORDER — ACETAMINOPHEN 325 MG PO TABS
650.0000 mg | ORAL_TABLET | Freq: Once | ORAL | Status: AC
  Administered 2024-01-18: 650 mg via ORAL
  Filled 2024-01-18: qty 2

## 2024-01-18 MED ORDER — SODIUM CHLORIDE 0.9 % IV SOLN
375.0000 mg/m2 | Freq: Once | INTRAVENOUS | Status: AC
  Administered 2024-01-18: 700 mg via INTRAVENOUS
  Filled 2024-01-18: qty 50

## 2024-01-18 NOTE — Progress Notes (Signed)
 Patient is used to taking something for her anxiety but she forgot to take it and is worried about today's treatment.

## 2024-01-18 NOTE — Patient Instructions (Signed)
 Rituximab Injection What is this medication? RITUXIMAB (ri TUX i mab) treats leukemia and lymphoma. It works by blocking a protein that causes cancer cells to grow and multiply. This helps to slow or stop the spread of cancer cells. It may also be used to treat autoimmune conditions, such as arthritis. It works by slowing down an overactive immune system. It is a monoclonal antibody. This medicine may be used for other purposes; ask your health care provider or pharmacist if you have questions. COMMON BRAND NAME(S): RIABNI, Rituxan, RUXIENCE, truxima What should I tell my care team before I take this medication? They need to know if you have any of these conditions: Chest pain Heart disease Immune system problems Infection, such as chickenpox, cold sores, hepatitis B, herpes Irregular heartbeat or rhythm Kidney disease Low blood counts, such as low white cells, platelets, red cells Lung disease Recent or upcoming vaccine An unusual or allergic reaction to rituximab, other medications, foods, dyes, or preservatives Pregnant or trying to get pregnant Breast-feeding How should I use this medication? This medication is injected into a vein. It is given by a care team in a hospital or clinic setting. A special MedGuide will be given to you before each treatment. Be sure to read this information carefully each time. Talk to your care team about the use of this medication in children. While this medication may be prescribed for children as young as 6 months for selected conditions, precautions do apply. Overdosage: If you think you have taken too much of this medicine contact a poison control center or emergency room at once. NOTE: This medicine is only for you. Do not share this medicine with others. What if I miss a dose? Keep appointments for follow-up doses. It is important not to miss your dose. Call your care team if you are unable to keep an appointment. What may interact with this  medication? Do not take this medication with any of the following: Live vaccines This medication may also interact with the following: Cisplatin This list may not describe all possible interactions. Give your health care provider a list of all the medicines, herbs, non-prescription drugs, or dietary supplements you use. Also tell them if you smoke, drink alcohol, or use illegal drugs. Some items may interact with your medicine. What should I watch for while using this medication? Your condition will be monitored carefully while you are receiving this medication. You may need blood work while taking this medication. This medication can cause serious infusion reactions. To reduce the risk your care team may give you other medications to take before receiving this one. Be sure to follow the directions from your care team. This medication may increase your risk of getting an infection. Call your care team for advice if you get a fever, chills, sore throat, or other symptoms of a cold or flu. Do not treat yourself. Try to avoid being around people who are sick. Call your care team if you are around anyone with measles, chickenpox, or if you develop sores or blisters that do not heal properly. Avoid taking medications that contain aspirin, acetaminophen, ibuprofen, naproxen, or ketoprofen unless instructed by your care team. These medications may hide a fever. This medication may cause serious skin reactions. They can happen weeks to months after starting the medication. Contact your care team right away if you notice fevers or flu-like symptoms with a rash. The rash may be red or purple and then turn into blisters or peeling of the skin.  You may also notice a red rash with swelling of the face, lips, or lymph nodes in your neck or under your arms. In some patients, this medication may cause a serious brain infection that may cause death. If you have any problems seeing, thinking, speaking, walking, or  standing, tell your care team right away. If you cannot reach your care team, urgently seek another source of medical care. Talk to your care team if you may be pregnant. Serious birth defects can occur if you take this medication during pregnancy and for 12 months after the last dose. You will need a negative pregnancy test before starting this medication. Contraception is recommended while taking this medication and for 12 months after the last dose. Your care team can help you find the option that works for you. Do not breastfeed while taking this medication and for at least 6 months after the last dose. What side effects may I notice from receiving this medication? Side effects that you should report to your care team as soon as possible: Allergic reactions or angioedema--skin rash, itching or hives, swelling of the face, eyes, lips, tongue, arms, or legs, trouble swallowing or breathing Bowel blockage--stomach cramping, unable to have a bowel movement or pass gas, loss of appetite, vomiting Dizziness, loss of balance or coordination, confusion or trouble speaking Heart attack--pain or tightness in the chest, shoulders, arms, or jaw, nausea, shortness of breath, cold or clammy skin, feeling faint or lightheaded Heart rhythm changes--fast or irregular heartbeat, dizziness, feeling faint or lightheaded, chest pain, trouble breathing Infection--fever, chills, cough, sore throat, wounds that don't heal, pain or trouble when passing urine, general feeling of discomfort or being unwell Infusion reactions--chest pain, shortness of breath or trouble breathing, feeling faint or lightheaded Kidney injury--decrease in the amount of urine, swelling of the ankles, hands, or feet Liver injury--right upper belly pain, loss of appetite, nausea, light-colored stool, dark yellow or brown urine, yellowing skin or eyes, unusual weakness or fatigue Redness, blistering, peeling, or loosening of the skin, including  inside the mouth Stomach pain that is severe, does not go away, or gets worse Tumor lysis syndrome (TLS)--nausea, vomiting, diarrhea, decrease in the amount of urine, dark urine, unusual weakness or fatigue, confusion, muscle pain or cramps, fast or irregular heartbeat, joint pain Side effects that usually do not require medical attention (report to your care team if they continue or are bothersome): Headache Joint pain Nausea Runny or stuffy nose Unusual weakness or fatigue This list may not describe all possible side effects. Call your doctor for medical advice about side effects. You may report side effects to FDA at 1-800-FDA-1088. Where should I keep my medication? This medication is given in a hospital or clinic. It will not be stored at home. NOTE: This sheet is a summary. It may not cover all possible information. If you have questions about this medicine, talk to your doctor, pharmacist, or health care provider.  2024 Elsevier/Gold Standard (2021-09-01 00:00:00)
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
## 2024-01-23 ENCOUNTER — Ambulatory Visit: Admitting: Oncology

## 2024-01-23 ENCOUNTER — Other Ambulatory Visit

## 2024-01-30 ENCOUNTER — Encounter: Payer: Self-pay | Admitting: Oncology

## 2024-02-22 ENCOUNTER — Other Ambulatory Visit: Payer: Self-pay | Admitting: Oncology

## 2024-02-22 ENCOUNTER — Encounter: Payer: Self-pay | Admitting: Oncology

## 2024-02-27 ENCOUNTER — Other Ambulatory Visit: Payer: Self-pay | Admitting: Oncology

## 2024-02-28 ENCOUNTER — Ambulatory Visit: Payer: Self-pay

## 2024-02-28 ENCOUNTER — Other Ambulatory Visit: Payer: Self-pay | Admitting: Family Medicine

## 2024-02-28 DIAGNOSIS — K219 Gastro-esophageal reflux disease without esophagitis: Secondary | ICD-10-CM

## 2024-02-28 DIAGNOSIS — I10 Essential (primary) hypertension: Secondary | ICD-10-CM

## 2024-02-28 NOTE — Telephone Encounter (Signed)
 FYI Only or Action Required?: FYI only for provider: appointment scheduled on 02/29/24.  Patient was last seen in primary care on 12/17/2023 by Edman Marsa PARAS, DO.  Called Nurse Triage reporting Arm Pain.  Symptoms began a week ago.  Interventions attempted: OTC medications: tylenol , aleve  and Rest, hydration, or home remedies.  Symptoms are: unchanged.  Triage Disposition: See PCP When Office is Open (Within 3 Days)  Patient/caregiver understands and will follow disposition?: Yes    Copied from CRM [id number]. Topic: Clinical - Red Word Triage >> Feb 28, 2024  9:17 AM Treva T wrote: [facility] that prompted transfer to Nurse Triage: Patient states she cannot lift left arm over her head, is also having increased pain, radiating from shoulder.  Patient also reports she thinks she has a sinus infection.  Patient is requesting an appointment for evaluation. Reason for Disposition  [1] MODERATE pain (e.g., interferes with normal activities) AND [2] present > 3 days  Answer Assessment - Initial Assessment Questions 1. ONSET: When did the pain start?     One week  2. LOCATION: Where is the pain located?     Left shoulder  3. PAIN: How bad is the pain? (Scale 1-10; or mild, moderate, severe)     Difficult to raise arm  4. WORK OR EXERCISE: Has there been any recent work or exercise that involved this part of the body?     Work lifts mop buckets  5. CAUSE: What do you think is causing the shoulder pain?     Work  6. OTHER SYMPTOMS: Do you have any other symptoms? (e.g., neck pain, swelling, rash, fever, numbness, weakness)     Radiated down arm. Productive cough, sinus infection no difficulty breathing.  7. PREGNANCY: Is there any chance you are pregnant? When was your last menstrual period?  Protocols used: Shoulder Pain-A-AH

## 2024-02-29 ENCOUNTER — Encounter: Payer: Self-pay | Admitting: Family Medicine

## 2024-02-29 ENCOUNTER — Inpatient Hospital Stay: Payer: Self-pay | Attending: Oncology

## 2024-02-29 ENCOUNTER — Ambulatory Visit: Admitting: Family Medicine

## 2024-02-29 VITALS — BP 124/76 | HR 82 | Temp 98.8°F | Ht 63.0 in | Wt 169.1 lb

## 2024-02-29 DIAGNOSIS — J0110 Acute frontal sinusitis, unspecified: Secondary | ICD-10-CM | POA: Diagnosis not present

## 2024-02-29 DIAGNOSIS — R051 Acute cough: Secondary | ICD-10-CM | POA: Diagnosis not present

## 2024-02-29 DIAGNOSIS — M25512 Pain in left shoulder: Secondary | ICD-10-CM | POA: Diagnosis not present

## 2024-02-29 DIAGNOSIS — M7552 Bursitis of left shoulder: Secondary | ICD-10-CM

## 2024-02-29 DIAGNOSIS — G8929 Other chronic pain: Secondary | ICD-10-CM | POA: Diagnosis not present

## 2024-02-29 DIAGNOSIS — D693 Immune thrombocytopenic purpura: Secondary | ICD-10-CM | POA: Diagnosis not present

## 2024-02-29 LAB — CBC WITH DIFFERENTIAL/PLATELET
Abs Immature Granulocytes: 0.04 K/uL (ref 0.00–0.07)
Basophils Absolute: 0.1 K/uL (ref 0.0–0.1)
Basophils Relative: 1 %
Eosinophils Absolute: 0.1 K/uL (ref 0.0–0.5)
Eosinophils Relative: 2 %
HCT: 42.6 % (ref 36.0–46.0)
Hemoglobin: 14.1 g/dL (ref 12.0–15.0)
Immature Granulocytes: 1 %
Lymphocytes Relative: 22 %
Lymphs Abs: 1.3 K/uL (ref 0.7–4.0)
MCH: 28.4 pg (ref 26.0–34.0)
MCHC: 33.1 g/dL (ref 30.0–36.0)
MCV: 85.9 fL (ref 80.0–100.0)
Monocytes Absolute: 0.4 K/uL (ref 0.1–1.0)
Monocytes Relative: 6 %
Neutro Abs: 4.2 K/uL (ref 1.7–7.7)
Neutrophils Relative %: 68 %
Platelets: 235 K/uL (ref 150–400)
RBC: 4.96 MIL/uL (ref 3.87–5.11)
RDW: 14.6 % (ref 11.5–15.5)
WBC: 6.1 K/uL (ref 4.0–10.5)
nRBC: 0 % (ref 0.0–0.2)

## 2024-02-29 LAB — POC COVID19/FLU A&B COMBO
Covid Antigen, POC: NEGATIVE
Influenza A Antigen, POC: NEGATIVE
Influenza B Antigen, POC: NEGATIVE

## 2024-02-29 MED ORDER — PREDNISONE 20 MG PO TABS: ORAL_TABLET | ORAL | 0 refills | Status: DC

## 2024-02-29 MED ORDER — CYCLOBENZAPRINE HCL 10 MG PO TABS: 5.0000 mg | ORAL_TABLET | Freq: Two times a day (BID) | ORAL | 1 refills | Status: AC | PRN

## 2024-02-29 MED ORDER — HYDROCODONE-ACETAMINOPHEN 5-325 MG PO TABS: 1.0000 | ORAL_TABLET | ORAL | 0 refills | Status: AC | PRN

## 2024-02-29 MED ORDER — AMOXICILLIN-POT CLAVULANATE 875-125 MG PO TABS: 1.0000 | ORAL_TABLET | Freq: Two times a day (BID) | ORAL | 0 refills | Status: DC
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
## 2024-02-29 NOTE — Patient Instructions (Addendum)
 Thank you for coming to the office today.  Start the Prednisone  taper  Hold Naproxen   Augmentin  if not improving with sinuses  Start Cyclobenzapine (Flexeril ) 10mg  tablets (muscle relaxant) - start with half (cut) to one whole pill at night for muscle relaxant - may make you sedated or sleepy (be careful driving or working on this) if tolerated you can take half to whole tab 2 to 3 times daily or every 8 hours as needed  Hydrocodone  as needed for severe pain  Left shoulder bursitis / rotator cuff tendonitis  Need further evaluation from Ortho, may need MRI  Referral back to Dr Kathlynn. We did try to refer back in 2024, but they could not connect to schedule with you.  [facility] [address] [place], [place]  [address]  [phone]  [facility] Phone: [phone]   Please schedule a Follow-up Appointment to: Return if symptoms worsen or fail to improve.  If you have any other questions or concerns, please feel free to call the office or send a message through MyChart. You may also schedule an earlier appointment if necessary.  Additionally, you may be receiving a survey about your experience at our office within a few days to 1 week by e-mail or mail. We value your feedback.  Marsa Officer, DO [facility], [facility]   Range of Motion Shoulder Exercises  Pendulum Circles - Lean with your good arm against a counter or table for support [facility] forward with a wide stance (make sure your body is comfortable) - Your painful shoulder should hang down and feel heavy - Gently move your painful arm in small circles clockwise for several turns - Switch to counterclockwise for several turns - Early on keep circles narrow and move slowly - Later in rehab, move in larger circles and faster movement   Wall Crawl - Stand close (about 1-2 ft away) to a wall, facing it directly - Reach out with your arm of painful shoulder and place  fingers (not palm) on wall - You should make contact with wall at your waist level - Slowly walk your fingers up the wall. Stay in contact with wall entire time, do not remove fingers - Keep walking fingers up wall until you reach shoulder level - You may feel tightening or mild discomfort, once you reach a height that causes pain or if you are already above your shoulder height then stop. Repeat from starting position. - Early on stand closer to wall, move fingers slowly, and stay at or below shoulder level - Later in rehab, stand farther away from wall (fingertips), move fingers quicker, go above shoulder level

## 2024-02-29 NOTE — Telephone Encounter (Signed)
 Requested Prescriptions  Pending Prescriptions Disp Refills   pantoprazole  (PROTONIX ) 40 MG tablet [Pharmacy Med Name: PANTOPRAZOLE  SOD DR 40 MG TAB] 90 tablet 0    Sig: TAKE 1 TABLET BY MOUTH EVERY DAY     Gastroenterology: Proton Pump Inhibitors Passed - 02/29/2024 12:41 PM      Passed - Valid encounter within last 12 months    Recent Outpatient Visits           2 months ago Chronic midline low back pain with right-sided sciatica   [place] [facility] [place], Marsa PARAS, DO   2 months ago Chronic midline low back pain with right-sided sciatica   [place] [facility] [place], Angeline ORN, NP   4 months ago Acute non-recurrent pansinusitis   [place] [facility] [place], [place] W, NP   6 months ago Acute bilateral low back pain with bilateral sciatica   [place] [facility] [place], Marsa PARAS, DO   8 months ago Acute non-recurrent frontal sinusitis   [place] [facility] [place], Marsa PARAS, DO               hydrochlorothiazide  (HYDRODIURIL ) 25 MG tablet [Pharmacy Med Name: HYDROCHLOROTHIAZIDE  25 MG TAB] 90 tablet 0    Sig: TAKE 1 TABLET (25 MG TOTAL) BY MOUTH DAILY.     Cardiovascular: Diuretics - Thiazide Failed - 02/29/2024 12:41 PM      Failed - K in normal range and within 180 days    Potassium  Date Value Ref Range Status  12/18/2023 2.9 (L) 3.5 - 5.3 mmol/L Final         Passed - Cr in normal range and within 180 days    Creat  Date Value Ref Range Status  12/18/2023 0.93 0.50 - 1.05 mg/dL Final         Passed - Na in normal range and within 180 days    Sodium  Date Value Ref Range Status  12/18/2023 139 135 - 146 mmol/L Final  08/04/2015 140 134 - 144 mmol/L Final         Passed - Last BP in normal range    BP Readings from Last 1 Encounters:  01/18/24 (!) 121/58         Passed - Valid encounter within last 6 months    Recent Outpatient Visits            2 months ago Chronic midline low back pain with right-sided sciatica   [place] [facility] [place] Marsa PARAS, DO   2 months ago Chronic midline low back pain with right-sided sciatica   [place] [facility] [place], Angeline ORN, NP   4 months ago Acute non-recurrent pansinusitis   [place] [facility] [place], [place], NP   6 months ago Acute bilateral low back pain with bilateral sciatica   [place] [facility] [place], Marsa PARAS, DO   8 months ago Acute non-recurrent frontal sinusitis   [place] [facility] [place], Marsa PARAS, [place]

## 2024-03-15 ENCOUNTER — Other Ambulatory Visit: Payer: Self-pay | Admitting: Family Medicine

## 2024-03-15 DIAGNOSIS — I10 Essential (primary) hypertension: Secondary | ICD-10-CM

## 2024-03-17 NOTE — Telephone Encounter (Signed)
 Requested Prescriptions  Pending Prescriptions Disp Refills   amLODipine  (NORVASC ) 10 MG tablet [Pharmacy Med Name: AMLODIPINE  BESYLATE 10 MG TAB] 90 tablet 1    Sig: TAKE 1 TABLET BY MOUTH EVERY DAY     Cardiovascular: Calcium Channel Blockers 2 Passed - 03/17/2024  1:56 PM      Passed - Last BP in normal range    BP Readings from Last 1 Encounters:  02/29/24 124/76         Passed - Last Heart Rate in normal range    Pulse Readings from Last 1 Encounters:  02/29/24 82         Passed - Valid encounter within last 6 months    Recent Outpatient Visits           2 weeks ago Chronic shoulder bursitis, left   [place] [facility] [place], Marsa PARAS, DO   3 months ago Chronic midline low back pain with right-sided sciatica   [place] [facility] [place] Marsa PARAS, DO   3 months ago Chronic midline low back pain with right-sided sciatica   [place] [facility] [place], Angeline ORN, NP   5 months ago Acute non-recurrent pansinusitis   [place] [facility] [place], [place] W, NP   7 months ago Acute bilateral low back pain with bilateral sciatica   [place] [facility] [place], Marsa PARAS, [place]

## 2024-03-18 ENCOUNTER — Other Ambulatory Visit: Payer: Self-pay | Admitting: Sports Medicine

## 2024-03-18 DIAGNOSIS — M75102 Unspecified rotator cuff tear or rupture of left shoulder, not specified as traumatic: Secondary | ICD-10-CM

## 2024-03-25 ENCOUNTER — Ambulatory Visit
Admission: RE | Admit: 2024-03-25 | Discharge: 2024-03-25 | Disposition: A | Discharge status: Home / Self Care | Source: Ambulatory Visit | Attending: Sports Medicine | Admitting: Sports Medicine

## 2024-03-25 DIAGNOSIS — M75122 Complete rotator cuff tear or rupture of left shoulder, not specified as traumatic: Secondary | ICD-10-CM | POA: Diagnosis not present

## 2024-03-25 DIAGNOSIS — S43432A Superior glenoid labrum lesion of left shoulder, initial encounter: Secondary | ICD-10-CM | POA: Diagnosis not present

## 2024-03-25 DIAGNOSIS — M129 Arthropathy, unspecified: Secondary | ICD-10-CM | POA: Diagnosis not present

## 2024-03-25 DIAGNOSIS — M75102 Unspecified rotator cuff tear or rupture of left shoulder, not specified as traumatic: Secondary | ICD-10-CM | POA: Diagnosis not present

## 2024-03-25 DIAGNOSIS — S46812A Strain of other muscles, fascia and tendons at shoulder and upper arm level, left arm, initial encounter: Secondary | ICD-10-CM | POA: Diagnosis not present

## 2024-03-31 ENCOUNTER — Encounter: Payer: Self-pay | Admitting: Oncology

## 2024-04-08 DIAGNOSIS — Z748 Other problems related to care provider dependency: Secondary | ICD-10-CM | POA: Diagnosis not present

## 2024-04-08 DIAGNOSIS — M7542 Impingement syndrome of left shoulder: Secondary | ICD-10-CM | POA: Diagnosis not present

## 2024-04-08 DIAGNOSIS — M25412 Effusion, left shoulder: Secondary | ICD-10-CM | POA: Diagnosis not present

## 2024-04-08 DIAGNOSIS — M75102 Unspecified rotator cuff tear or rupture of left shoulder, not specified as traumatic: Secondary | ICD-10-CM | POA: Diagnosis not present

## 2024-04-08 DIAGNOSIS — G8929 Other chronic pain: Secondary | ICD-10-CM | POA: Diagnosis not present

## 2024-04-08 DIAGNOSIS — Z5941 Food insecurity: Secondary | ICD-10-CM | POA: Diagnosis not present

## 2024-04-08 DIAGNOSIS — M7552 Bursitis of left shoulder: Secondary | ICD-10-CM | POA: Diagnosis not present

## 2024-04-08 DIAGNOSIS — M25512 Pain in left shoulder: Secondary | ICD-10-CM | POA: Diagnosis not present

## 2024-04-08 DIAGNOSIS — Z5912 Inadequate housing utilities: Secondary | ICD-10-CM | POA: Diagnosis not present

## 2024-04-08 DIAGNOSIS — Z59869 Financial insecurity, unspecified: Secondary | ICD-10-CM | POA: Diagnosis not present

## 2024-04-11 ENCOUNTER — Ambulatory Visit: Admitting: Oncology

## 2024-04-11 ENCOUNTER — Other Ambulatory Visit: Attending: Oncology

## 2024-04-11 ENCOUNTER — Telehealth: Payer: Self-pay | Admitting: Oncology

## 2024-04-11 NOTE — Telephone Encounter (Signed)
 Pt left vm to cancel appts for today 12/19.  I called pt back but no answer and vmbx is full.  Appts canceled and noted

## 2024-05-21 ENCOUNTER — Ambulatory Visit: Admitting: Internal Medicine

## 2024-05-21 ENCOUNTER — Encounter: Payer: Self-pay | Admitting: Family Medicine

## 2024-05-21 ENCOUNTER — Ambulatory Visit (INDEPENDENT_AMBULATORY_CARE_PROVIDER_SITE_OTHER): Admitting: Family Medicine

## 2024-05-21 VITALS — BP 124/60 | HR 49 | Ht 63.0 in | Wt 173.5 lb

## 2024-05-21 DIAGNOSIS — R051 Acute cough: Secondary | ICD-10-CM

## 2024-05-21 DIAGNOSIS — J0110 Acute frontal sinusitis, unspecified: Secondary | ICD-10-CM | POA: Diagnosis not present

## 2024-05-21 MED ORDER — PREDNISONE 50 MG PO TABS: 50.0000 mg | ORAL_TABLET | Freq: Every day | ORAL | 0 refills | Status: AC

## 2024-05-21 MED ORDER — AMOXICILLIN-POT CLAVULANATE 875-125 MG PO TABS: 1.0000 | ORAL_TABLET | Freq: Two times a day (BID) | ORAL | 0 refills | Status: AC

## 2024-05-21 NOTE — Patient Instructions (Addendum)
 Thank you for coming to the office today.   1. It sounds like you have a Sinusitis (Bacterial Infection) - this most likely started as an Upper Respiratory Virus that has settled into an infection. Allergies can also cause this. - Start Augmentin  1 pill twice daily (breakfast and dinner, with food and plenty of water) for 10 days, complete entire course, do not stop early even if feeling better  Add Prednisone  for cough  Chest X-ray ordered if not improved can do walk in Mon-Thurs  - Start Loratadine  (Claritin ) 10mg  daily and Flonase  2 sprays in each nostril daily for next 4-6 weeks, then you may stop and use seasonally or as needed - Recommend to keep using Nasal Saline spray multiple times a day to help flush out congestion and clear sinuses - Improve hydration by drinking plenty of clear fluids (water, gatorade) to reduce secretions and thin congestion - Congestion draining down throat can cause irritation. May try warm herbal tea with honey, cough drops - Can take Tylenol  or Ibuprofen as needed for fevers - May continue over the counter cold medicine as you are, I would not use any decongestant or mucinex  longer than 7 days.   Please schedule a Follow-up Appointment to: Return if symptoms worsen or fail to improve.  If you have any other questions or concerns, please feel free to call the office or send a message through MyChart. You may also schedule an earlier appointment if necessary.  Additionally, you may be receiving a survey about your experience at our office within a few days to 1 week by e-mail or mail. We value your feedback.  Marsa Officer, DO [facility], [place]

## 2024-05-21 NOTE — Progress Notes (Signed)
 "  Subjective:    Patient ID: Erica Bartlett, female    DOB: Sep 07, 1955, 69 y.o.   MRN: [id number]  Erica Bartlett is a 69 y.o. female presenting on 05/21/2024 for Cough (Congestion about 3 days )  Patient presents for a same day appointment.  HPI  Discussed the use of AI scribe software for clinical note transcription with the patient, who gave verbal consent to proceed.  History of Present Illness   [name] is a 69 year old female who presents with sinus congestion and respiratory symptoms.  Sinus congestion and headache - Onset approximately 3-4 days ago with progressive worsening - Severe headache and facial pressure, primarily localized in the face - Expectoration of thick, jelly-like mucus - Similar episode in November, previously responsive to Augmentin  - Feverish symptoms with significant temperature fluctuations, including feeling extremely hot and cold the night before last  Respiratory symptoms - Rattling sensation in the chest - Cough productive of mucus - Chest pain attributed to coughing  Sleep disturbance - Difficulty sleeping due to shoulder discomfort and frequent need to get up during the night  Followed by Hematology/Oncology History of immunocompromised on treatment   Shoulder pain - Chronic shoulder pain with history of shoulder issues - Awaiting surgical intervention, pending insurance approval          01/11/2024    8:34 AM 01/04/2024    8:23 AM 12/25/2023    9:00 AM  Depression screen PHQ 2/9  Decreased Interest 0 0 0  Down, Depressed, Hopeless 0 3 0  PHQ - 2 Score 0 3 0  Altered sleeping 0  0  Tired, decreased energy 0  0  Change in appetite 0  0  Feeling bad or failure about yourself  0  0  Trouble concentrating 0  0  Moving slowly or fidgety/restless 0  0  Suicidal thoughts 0  0  PHQ-9 Score 0   0      Data saved with a previous flowsheet row definition       10/03/2023   10:53 AM 08/16/2023   10:05 AM 06/01/2023     1:16 PM 07/21/2022    2:28 PM  GAD 7 : Generalized Anxiety Score  Nervous, Anxious, on Edge  2  2  2    Control/stop worrying  2  1  1    Worry too much - different things  2  2  1    Trouble relaxing  2  2  2    Restless  2  1  1    Easily annoyed or irritable  2  1  1    Afraid - awful might happen  2  1  0   Total GAD 7 Score  14 10 8   Anxiety Difficulty Not difficult at all Somewhat difficult Somewhat difficult Somewhat difficult     Data saved with a previous flowsheet row definition    Social History[1]  Review of Systems Per HPI unless specifically indicated above     Objective:    BP 124/60 (BP Location: Right Arm, Patient Position: Sitting, Cuff Size: Normal)   Pulse (!) 49   Ht 5' 3 (1.6 m)   Wt 173 lb 8 oz (78.7 kg)   SpO2 98%   BMI 30.73 kg/m   Wt Readings from Last 3 Encounters:  05/21/24 173 lb 8 oz (78.7 kg)  02/29/24 169 lb 2 oz (76.7 kg)  01/18/24 165 lb (74.8 kg)    Physical Exam Vitals and nursing note  reviewed.  Constitutional:      General: She is not in acute distress.    Appearance: Normal appearance. She is well-developed. She is not diaphoretic.     Comments: Well-appearing, comfortable, cooperative  HENT:     Head: Normocephalic and atraumatic.  Eyes:     General:        Right eye: No discharge.        Left eye: No discharge.     Conjunctiva/sclera: Conjunctivae normal.  Neck:     Thyroid: No thyromegaly.  Cardiovascular:     Rate and Rhythm: Normal rate and regular rhythm.     Heart sounds: Normal heart sounds. No murmur heard. Pulmonary:     Effort: Pulmonary effort is normal. No respiratory distress.     Breath sounds: No wheezing or rales.     Comments: Mild reduced air movement, without focal abnormality Musculoskeletal:        General: Normal range of motion.     Cervical back: Normal range of motion and neck supple.  Lymphadenopathy:     Cervical: No cervical adenopathy.  Skin:    General: Skin is warm and dry.      Findings: No erythema or rash.  Neurological:     Mental Status: She is alert and oriented to person, place, and time.  Psychiatric:        Mood and Affect: Mood normal.        Behavior: Behavior normal.        Thought Content: Thought content normal.     Comments: Well groomed, good eye contact, normal speech and thoughts     Results for orders placed or performed in visit on 02/29/24  POC Covid19/Flu A&B Antigen   Collection Time: 02/29/24  7:17 PM  Result Value Ref Range   Influenza A Antigen, POC Negative Negative   Influenza B Antigen, POC Negative Negative   Covid Antigen, POC Negative Negative      Assessment & Plan:   Problem List Items Addressed This Visit   None Visit Diagnoses       Acute non-recurrent frontal sinusitis    -  Primary   Relevant Medications   predniSONE  (DELTASONE ) 50 MG tablet   amoxicillin -clavulanate (AUGMENTIN ) 875-125 MG tablet     Acute cough       Relevant Medications   predniSONE  (DELTASONE ) 50 MG tablet   Other Relevant Orders   DG Chest 2 View        Acute frontal sinusitis with acute cough Acute frontal sinusitis with cough, facial pressure, headache, and thick sputum 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 Previous Augmentin  effective. Steroids considered for symptom relief / cough bronchospasm No fever or focal lower respiratory symptom  - Prescribed Augmentin  for 10 days. - Prescribed 5-day course of steroids 50 mg daily. - Ordered chest x-ray if no improvement. Future order CXR [facility] walk in if needed Follow up precautions given       Orders Placed This Encounter  Procedures   DG Chest 2 View    Standing Status:   Future    Expiration Date:   05/21/2025    Reason for Exam (SYMPTOM  OR DIAGNOSIS REQUIRED):   productive cough following sinusitis    Preferred imaging location?:   [facility]    Meds ordered this encounter   Medications   predniSONE  (DELTASONE ) 50 MG tablet    Sig: Take 1 tablet (50 mg total) by mouth  daily with breakfast.    Dispense:  5 tablet    Refill:  0   amoxicillin -clavulanate (AUGMENTIN ) 875-125 MG tablet    Sig: Take 1 tablet by mouth 2 (two) times daily.    Dispense:  20 tablet    Refill:  0    Follow up plan: Return if symptoms worsen or fail to improve.  Marsa Officer, DO [facility] 05/21/2024, 11:44 AM     [1]  Social History Tobacco Use   Smoking status: Some Days    Current packs/day: 0.15    Types: Cigarettes   Smokeless tobacco: Never   Tobacco comments:    1 PACK A WEEK  Vaping Use   Vaping status: Never Used  Substance Use Topics   Alcohol use: No   Drug use: No   "

## 2024-05-25 ENCOUNTER — Other Ambulatory Visit: Payer: Self-pay | Admitting: Family Medicine

## 2024-05-25 DIAGNOSIS — I10 Essential (primary) hypertension: Secondary | ICD-10-CM

## 2024-05-25 DIAGNOSIS — K219 Gastro-esophageal reflux disease without esophagitis: Secondary | ICD-10-CM

## 2024-05-30 ENCOUNTER — Other Ambulatory Visit: Payer: Self-pay | Admitting: Surgery

## 2024-06-03 ENCOUNTER — Ambulatory Visit: Admitting: Podiatry

## 2024-06-17 ENCOUNTER — Ambulatory Visit: Admit: 2024-06-17 | Admitting: Surgery

## 2024-07-02 ENCOUNTER — Ambulatory Visit

## 2024-07-04 ENCOUNTER — Ambulatory Visit
